# Patient Record
Sex: Female | Born: 1950
Health system: Southern US, Community
[De-identification: ages and names within clinical notes are randomized; demographics above are authoritative.]

## PROBLEM LIST (undated history)

## (undated) DIAGNOSIS — R0683 Snoring: Secondary | ICD-10-CM

## (undated) DIAGNOSIS — G4700 Insomnia, unspecified: Secondary | ICD-10-CM

## (undated) DIAGNOSIS — Z91199 Patient's noncompliance with other medical treatment and regimen due to unspecified reason: Secondary | ICD-10-CM

## (undated) DIAGNOSIS — Q231 Congenital insufficiency of aortic valve: Secondary | ICD-10-CM

## (undated) DIAGNOSIS — M545 Low back pain, unspecified: Secondary | ICD-10-CM

## (undated) DIAGNOSIS — Z9109 Other allergy status, other than to drugs and biological substances: Secondary | ICD-10-CM

## (undated) DIAGNOSIS — M797 Fibromyalgia: Secondary | ICD-10-CM

## (undated) DIAGNOSIS — R5383 Other fatigue: Secondary | ICD-10-CM

## (undated) DIAGNOSIS — F329 Major depressive disorder, single episode, unspecified: Secondary | ICD-10-CM

## (undated) DIAGNOSIS — M2550 Pain in unspecified joint: Secondary | ICD-10-CM

## (undated) DIAGNOSIS — Z780 Asymptomatic menopausal state: Secondary | ICD-10-CM

## (undated) DIAGNOSIS — Z960 Presence of urogenital implants: Secondary | ICD-10-CM

## (undated) DIAGNOSIS — Q2381 Bicuspid aortic valve: Secondary | ICD-10-CM

## (undated) DIAGNOSIS — M5030 Other cervical disc degeneration, unspecified cervical region: Secondary | ICD-10-CM

## (undated) DIAGNOSIS — J3081 Allergic rhinitis due to animal (cat) (dog) hair and dander: Secondary | ICD-10-CM

## (undated) DIAGNOSIS — M51369 Other intervertebral disc degeneration, lumbar region without mention of lumbar back pain or lower extremity pain: Secondary | ICD-10-CM

## (undated) DIAGNOSIS — F1190 Opioid use, unspecified, uncomplicated: Secondary | ICD-10-CM

## (undated) DIAGNOSIS — M2560 Stiffness of unspecified joint, not elsewhere classified: Secondary | ICD-10-CM

## (undated) DIAGNOSIS — R202 Paresthesia of skin: Secondary | ICD-10-CM

## (undated) DIAGNOSIS — N926 Irregular menstruation, unspecified: Secondary | ICD-10-CM

## (undated) DIAGNOSIS — Z8489 Family history of other specified conditions: Secondary | ICD-10-CM

## (undated) DIAGNOSIS — R011 Cardiac murmur, unspecified: Secondary | ICD-10-CM

## (undated) DIAGNOSIS — G4730 Sleep apnea, unspecified: Secondary | ICD-10-CM

## (undated) DIAGNOSIS — G5711 Meralgia paresthetica, right lower limb: Secondary | ICD-10-CM

## (undated) DIAGNOSIS — G43909 Migraine, unspecified, not intractable, without status migrainosus: Secondary | ICD-10-CM

## (undated) DIAGNOSIS — K219 Gastro-esophageal reflux disease without esophagitis: Secondary | ICD-10-CM

## (undated) DIAGNOSIS — E039 Hypothyroidism, unspecified: Secondary | ICD-10-CM

## (undated) DIAGNOSIS — J45909 Unspecified asthma, uncomplicated: Secondary | ICD-10-CM

## (undated) DIAGNOSIS — T7840XA Allergy, unspecified, initial encounter: Secondary | ICD-10-CM

## (undated) DIAGNOSIS — F909 Attention-deficit hyperactivity disorder, unspecified type: Secondary | ICD-10-CM

## (undated) DIAGNOSIS — H9319 Tinnitus, unspecified ear: Secondary | ICD-10-CM

## (undated) DIAGNOSIS — E785 Hyperlipidemia, unspecified: Secondary | ICD-10-CM

## (undated) DIAGNOSIS — R200 Anesthesia of skin: Secondary | ICD-10-CM

## (undated) DIAGNOSIS — D649 Anemia, unspecified: Secondary | ICD-10-CM

## (undated) DIAGNOSIS — M1990 Unspecified osteoarthritis, unspecified site: Secondary | ICD-10-CM

## (undated) DIAGNOSIS — S060XAA Concussion with loss of consciousness status unknown, initial encounter: Secondary | ICD-10-CM

## (undated) DIAGNOSIS — F32A Depression, unspecified: Secondary | ICD-10-CM

## (undated) DIAGNOSIS — F419 Anxiety disorder, unspecified: Secondary | ICD-10-CM

## (undated) DIAGNOSIS — I340 Nonrheumatic mitral (valve) insufficiency: Secondary | ICD-10-CM

## (undated) DIAGNOSIS — T8859XA Other complications of anesthesia, initial encounter: Secondary | ICD-10-CM

## (undated) DIAGNOSIS — S060X9A Concussion with loss of consciousness of unspecified duration, initial encounter: Secondary | ICD-10-CM

## (undated) DIAGNOSIS — T4145XA Adverse effect of unspecified anesthetic, initial encounter: Secondary | ICD-10-CM

## (undated) HISTORY — PX: CATARACT EXTRACTION: SUR2

## (undated) HISTORY — DX: Anesthesia of skin: R20.0

## (undated) HISTORY — PX: HERNIA REPAIR: SHX51

## (undated) HISTORY — DX: Stiffness of unspecified joint, not elsewhere classified: M25.60

## (undated) HISTORY — DX: Other allergy status, other than to drugs and biological substances: Z91.09

## (undated) HISTORY — DX: Other fatigue: R53.83

## (undated) HISTORY — DX: Migraine, unspecified, not intractable, without status migrainosus: G43.909

## (undated) HISTORY — PX: DENTAL SURGERY: SHX609

## (undated) HISTORY — DX: Paresthesia of skin: R20.2

## (undated) HISTORY — DX: Snoring: R06.83

## (undated) HISTORY — DX: Allergic rhinitis due to animal (cat) (dog) hair and dander: J30.81

## (undated) HISTORY — DX: Asymptomatic menopausal state: Z78.0

## (undated) HISTORY — DX: Hyperlipidemia, unspecified: E78.5

## (undated) HISTORY — DX: Unspecified asthma, uncomplicated: J45.909

## (undated) HISTORY — DX: Bicuspid aortic valve: Q23.81

## (undated) HISTORY — DX: Concussion with loss of consciousness status unknown, initial encounter: S06.0XAA

## (undated) HISTORY — PX: TONSILLECTOMY: SUR1361

## (undated) HISTORY — DX: Anemia, unspecified: D64.9

## (undated) HISTORY — DX: Attention-deficit hyperactivity disorder, unspecified type: F90.9

## (undated) HISTORY — DX: Pain in unspecified joint: M25.50

## (undated) HISTORY — PX: ABDOMINOPLASTY: SHX5355

## (undated) HISTORY — DX: Unspecified osteoarthritis, unspecified site: M19.90

## (undated) HISTORY — DX: Depression, unspecified: F32.A

## (undated) HISTORY — DX: Allergy, unspecified, initial encounter: T78.40XA

## (undated) HISTORY — DX: Tinnitus, unspecified ear: H93.19

## (undated) HISTORY — DX: Concussion with loss of consciousness of unspecified duration, initial encounter: S06.0X9A

## (undated) HISTORY — PX: SHOULDER ARTHROSCOPY W/ ACROMIAL REPAIR: SUR94

## (undated) HISTORY — DX: Low back pain, unspecified: M54.50

## (undated) HISTORY — DX: Major depressive disorder, single episode, unspecified: F32.9

## (undated) HISTORY — DX: Nonrheumatic mitral (valve) insufficiency: I34.0

## (undated) HISTORY — PX: COLONOSCOPY: SHX174

## (undated) HISTORY — DX: Irregular menstruation, unspecified: N92.6

---

## 1994-06-12 HISTORY — PX: CARPAL TUNNEL RELEASE: SHX101

## 2010-06-12 HISTORY — PX: PARATHYROIDECTOMY: SHX19

## 2011-07-11 DIAGNOSIS — F329 Major depressive disorder, single episode, unspecified: Secondary | ICD-10-CM | POA: Insufficient documentation

## 2011-07-11 DIAGNOSIS — R5383 Other fatigue: Secondary | ICD-10-CM | POA: Insufficient documentation

## 2011-12-24 DIAGNOSIS — F909 Attention-deficit hyperactivity disorder, unspecified type: Secondary | ICD-10-CM | POA: Insufficient documentation

## 2012-06-08 DIAGNOSIS — Z8639 Personal history of other endocrine, nutritional and metabolic disease: Secondary | ICD-10-CM | POA: Insufficient documentation

## 2012-08-07 DIAGNOSIS — M25512 Pain in left shoulder: Secondary | ICD-10-CM | POA: Insufficient documentation

## 2012-08-07 DIAGNOSIS — M545 Low back pain: Secondary | ICD-10-CM | POA: Insufficient documentation

## 2012-12-12 DIAGNOSIS — M5416 Radiculopathy, lumbar region: Secondary | ICD-10-CM | POA: Insufficient documentation

## 2012-12-31 DIAGNOSIS — M25552 Pain in left hip: Secondary | ICD-10-CM | POA: Insufficient documentation

## 2012-12-31 DIAGNOSIS — M51379 Other intervertebral disc degeneration, lumbosacral region without mention of lumbar back pain or lower extremity pain: Secondary | ICD-10-CM | POA: Insufficient documentation

## 2012-12-31 DIAGNOSIS — M5137 Other intervertebral disc degeneration, lumbosacral region: Secondary | ICD-10-CM | POA: Insufficient documentation

## 2013-04-09 DIAGNOSIS — D1800 Hemangioma unspecified site: Secondary | ICD-10-CM | POA: Insufficient documentation

## 2013-04-26 DIAGNOSIS — K760 Fatty (change of) liver, not elsewhere classified: Secondary | ICD-10-CM | POA: Insufficient documentation

## 2013-08-28 DIAGNOSIS — M47812 Spondylosis without myelopathy or radiculopathy, cervical region: Secondary | ICD-10-CM | POA: Insufficient documentation

## 2013-10-15 DIAGNOSIS — Z79899 Other long term (current) drug therapy: Secondary | ICD-10-CM | POA: Insufficient documentation

## 2014-05-29 ENCOUNTER — Encounter: Payer: Self-pay | Admitting: Physical Medicine & Rehabilitation

## 2014-06-23 ENCOUNTER — Encounter: Payer: BLUE CROSS/BLUE SHIELD | Attending: Physical Medicine & Rehabilitation

## 2014-06-23 ENCOUNTER — Other Ambulatory Visit: Payer: Self-pay | Admitting: Physical Medicine & Rehabilitation

## 2014-06-23 ENCOUNTER — Ambulatory Visit (HOSPITAL_BASED_OUTPATIENT_CLINIC_OR_DEPARTMENT_OTHER): Payer: BLUE CROSS/BLUE SHIELD | Admitting: Physical Medicine & Rehabilitation

## 2014-06-23 ENCOUNTER — Encounter: Payer: Self-pay | Admitting: Physical Medicine & Rehabilitation

## 2014-06-23 VITALS — BP 120/64 | HR 70 | Resp 14

## 2014-06-23 DIAGNOSIS — M4806 Spinal stenosis, lumbar region: Secondary | ICD-10-CM | POA: Insufficient documentation

## 2014-06-23 DIAGNOSIS — Z5181 Encounter for therapeutic drug level monitoring: Secondary | ICD-10-CM

## 2014-06-23 DIAGNOSIS — M4716 Other spondylosis with myelopathy, lumbar region: Secondary | ICD-10-CM | POA: Diagnosis not present

## 2014-06-23 DIAGNOSIS — M47812 Spondylosis without myelopathy or radiculopathy, cervical region: Secondary | ICD-10-CM | POA: Diagnosis not present

## 2014-06-23 DIAGNOSIS — Z79899 Other long term (current) drug therapy: Secondary | ICD-10-CM

## 2014-06-23 DIAGNOSIS — M5030 Other cervical disc degeneration, unspecified cervical region: Secondary | ICD-10-CM | POA: Insufficient documentation

## 2014-06-23 DIAGNOSIS — M48062 Spinal stenosis, lumbar region with neurogenic claudication: Secondary | ICD-10-CM

## 2014-06-23 NOTE — Addendum Note (Signed)
Addended by: Geryl Rankins D on: 06/23/2014 01:50 PM   Modules accepted: Orders

## 2014-06-23 NOTE — Patient Instructions (Signed)
L2-L3 paramedian epidural steroid injection for left thigh burning pain If this is not helpful in relieving the burning pain in your thigh, we would increase the Lyrica  This will be followed by Bilateral medial branch block L2-L3 L4-L5 to block pain from the L3-4 L4-5 and L5-S1 facet joints

## 2014-06-23 NOTE — Progress Notes (Signed)
Subjective:    Patient ID: Courtney [name], female    DOB: July 09, 1950, 64 y.o.   MRN: [id number] Reviewed extensive records from [facility] pain clinic HPI CC:  Low back pain and Left thigh numbness Several year hx of neck and back pain.  Good results with cervical RFA in May 2015 Has had L2-3 LESI with improvement of back and thigh pain ~1 yr ago Lumbar facet injections Bilateral L3,4,5  Intra-articular Very helpful for a couple weeks performed in August   Using Lyrica and Duloxetine with fair relief Tried tramadol in the past but unable to tolerate. Wants to avoid narcotic analgesics, still working  Pain Inventory Average Pain 9 Pain Right Now 9 My pain is constant, sharp, stabbing, tingling and aching  In the last 24 hours, has pain interfered with the following? General activity 8 Relation with others 5 Enjoyment of life 8 What TIME of day is your pain at its worst? VARIES Sleep (in general) Good  Pain is worse with: walking, bending, standing and some activites Pain improves with: rest, heat/ice, medication and injections Relief from Meds: 5  Mobility walk without assistance how many minutes can you walk? 15-30 ability to climb steps?  yes do you drive?  yes  Function employed # of hrs/week 37.5  [employer]  Neuro/Psych weakness numbness tingling trouble walking spasms depression anxiety  Prior Studies Any changes since last visit?  no  Physicians involved in your care Any changes since last visit?  no   Family History  Problem Relation Age of Onset  . COPD Mother   . Cancer Mother   . Hypertension Mother   . Hyperlipidemia Mother   . Heart disease Mother    History   Social History  . Marital Status: Married    Spouse Name: N/A    Number of Children: N/A  . Years of Education: N/A   Social History Main Topics  . Smoking status: Never Smoker   . Smokeless tobacco: Never Used  . Alcohol Use: 1.2 oz/week      2 Glasses of wine per week  . Drug Use: No  . Sexual Activity: No   Other Topics Concern  . None   Social History Narrative  . None   Past Surgical History  Procedure Laterality Date  . Hernia repair     Past Medical History  Diagnosis Date  . Arthritis   . Depression   . Hyperlipidemia    BP 120/64 mmHg  Pulse 70  Resp 14  SpO2 98%  LMP  (LMP Unknown)  Opioid Risk Score:   Fall Risk Score:    Review of Systems  HENT: Negative.   Eyes: Negative.   Respiratory: Negative.   Cardiovascular: Negative.   Gastrointestinal: Negative.   Endocrine: Negative.   Genitourinary: Negative.   Musculoskeletal: Positive for myalgias, back pain, arthralgias and neck pain.  Skin: Negative.   Allergic/Immunologic: Negative.   Neurological: Positive for weakness and numbness.       Tingling, trouble walking, spasms  Hematological: Negative.   Psychiatric/Behavioral: Positive for dysphoric mood. The patient is nervous/anxious.        Objective:   Physical Exam  Constitutional: She is oriented to person, place, and time. She appears well-developed and well-nourished.  HENT:  Head: Normocephalic and atraumatic.  Eyes: Conjunctivae and EOM are normal. Pupils are equal, round, and reactive to light.  Neck: Normal range of motion.  Musculoskeletal:       Right  hip: Normal.       Left hip: Normal.  Neurological: She is alert and oriented to person, place, and time. She displays no atrophy. A sensory deficit is present. She exhibits normal muscle tone. Gait normal.  Reflex Scores:      Tricep reflexes are 2+ on the right side and 2+ on the left side.      Bicep reflexes are 2+ on the right side and 2+ on the left side.      Brachioradialis reflexes are 2+ on the right side and 2+ on the left side.      Patellar reflexes are 2+ on the right side and 2+ on the left side.      Achilles reflexes are 2+ on the right side and 2+ on the left side. Psychiatric: She has a normal mood  and affect.  Nursing note and vitals reviewed.   Sensation to pinprick Reduced bilateral C6-C7, intact C8 Reduced left L3 and left L4  Negative straight leg raise test Positive left femoral stretch test  Motor strength is 5/5 bilateral deltoid, bicep, tricep 4+ at the grip limited by arthritic pains in the hands 5/5 bilateral hip flexor and extensor ankle dorsal flexion plantar flexor  Gait is without toe drag or knee instability  Lumbar range of motion full flexion, extension limited to 50%, lateral bending to 50%, twisting to 25%     Assessment & Plan:  1. Lumbar spinal stenosis with intermittent radicular pain left L3 distribution. Had good relief with prior L-2-L3 epidural injection performed close to 1 year ago. Now has recurrence of symptoms. We'll schedule for repeat injection. If not helpful we will have to increase Lyrica to 300 mg twice a day or 200 mg 3 times a day 2. Lumbar spondylosis with radiographic evidence of L3-L4, L4-L5, and L5-S1 levels bilaterally. Will schedule for lumbar medial branch blocks to be performed 2-3 weeks after lumbar epidural  3. Chronic lumbar pain has had fair improvements on duloxetine. Unable to take tramadol. Once to avoid narcotic analgesics.

## 2014-06-24 LAB — PRESCRIPTION MONITORING PROFILE (SOLSTAS)
Amphetamine/Meth: NEGATIVE ng/mL
BARBITURATE SCREEN, URINE: NEGATIVE ng/mL
Benzodiazepine Screen, Urine: NEGATIVE ng/mL
Buprenorphine, Urine: NEGATIVE ng/mL
Cannabinoid Scrn, Ur: NEGATIVE ng/mL
Carisoprodol, Urine: NEGATIVE ng/mL
Cocaine Metabolites: NEGATIVE ng/mL
Creatinine, Urine: 116.26 mg/dL (ref >20.0)
Fentanyl, Ur: NEGATIVE ng/mL
MDMA URINE: NEGATIVE ng/mL
Meperidine, Ur: NEGATIVE ng/mL
Methadone Screen, Urine: NEGATIVE ng/mL
NITRITES URINE, INITIAL: NEGATIVE ug/mL
Opiate Screen, Urine: NEGATIVE ng/mL
Oxycodone Screen, Ur: NEGATIVE ng/mL
PROPOXYPHENE: NEGATIVE ng/mL
TAPENTADOLUR: NEGATIVE ng/mL
TRAMADOL UR: NEGATIVE ng/mL
ZOLPIDEM, URINE: NEGATIVE ng/mL
pH, Initial: 5.1 pH (ref 4.5–8.9)

## 2014-06-24 LAB — PMP ALCOHOL METABOLITE (ETG): Ethyl Glucuronide (EtG): NEGATIVE ng/mL

## 2014-06-29 NOTE — Progress Notes (Signed)
Urine drug screen for this encounter is consistent for reported no medication

## 2014-07-07 ENCOUNTER — Encounter: Payer: Self-pay | Admitting: Physical Medicine & Rehabilitation

## 2014-07-07 ENCOUNTER — Ambulatory Visit (HOSPITAL_BASED_OUTPATIENT_CLINIC_OR_DEPARTMENT_OTHER): Payer: BLUE CROSS/BLUE SHIELD | Admitting: Physical Medicine & Rehabilitation

## 2014-07-07 VITALS — BP 99/58 | HR 79 | Resp 14

## 2014-07-07 DIAGNOSIS — M5416 Radiculopathy, lumbar region: Secondary | ICD-10-CM

## 2014-07-07 DIAGNOSIS — Z5181 Encounter for therapeutic drug level monitoring: Secondary | ICD-10-CM | POA: Diagnosis not present

## 2014-07-07 NOTE — Progress Notes (Signed)
Lumbar epidural steroid injection under fluoroscopic guidance Left Paramedian L2-3 Translaminar  Indication: Lumbosacral radiculitis is not relieved by medication management or other conservative care and interfering with self-care and mobility.  No  anticoagulant use.  Informed consent was obtained after describing risk and benefits of the procedure with the patient, this includes bleeding, bruising, infection, paralysis and medication side effects.  The patient wishes to proceed and has given written consent.  Patient was placed in a prone position.  The lumbar area was marked and prepped with Betadine.  It was entered with a 25-gauge 1-1/2 inch needle and one mL of 1% lidocaine was injected into the skin and subcutaneous tissue.  Then a 17-gauge spinal needle was inserted under fluoroscopic guidance into the Left L2-3 interlaminar space under AP and Lateral imaging.  Once needle tip of approximated the posterior elements, a loss of resistance technique was utilized with lateral imaging.  A positive loss of resistance was obtained and then confirmed by injecting 2 mL's of Omnipaque 180.  Then a solution containing 1.5 mL's of 6mg /ml Celestone and 1.5 mL's of 1% lidocaine was injected.  The patient tolerated procedure well.  Post procedure instructions were given.  Please see post procedure form.

## 2014-07-07 NOTE — Patient Instructions (Signed)

## 2014-07-07 NOTE — Progress Notes (Signed)
  PROCEDURE RECORD [facility]   Name: Courtney [name] DOB:03/09/1951 MRN: [id number]  Date:07/07/2014  Physician: Alysia Penna, MD    Nurse/CMA: Sydnei Ohaver RN  Allergies:  Allergies  Allergen Reactions  . Fish Allergy   . Peanuts [Peanut Oil]     ALL NUTS and their derivatives    Consent Signed: Yes.    Is patient diabetic? No.  CBG today?  Pregnant: No. LMP: No LMP recorded (lmp unknown). Patient is postmenopausal. (age 81-55)  Anticoagulants: no Anti-inflammatory: no Antibiotics: no  Procedure: L2-3 Translaminar Epidural Steroid Injection Position: Prone Start Time: 2:38 End Time: 2:43 Fluoro Time: 12 seconds  RN/CMA Biomedical engineer    Time 2:30 2:50    BP 99/58 104/59    Pulse 79 75    Respirations 14 14    O2 Sat 95 97    S/S 6 6    Pain Level 6/10 6/10     D/C home with son, patient A & O X 3, D/C instructions reviewed, and sits independently.

## 2014-07-28 ENCOUNTER — Encounter: Payer: Self-pay | Admitting: Physical Medicine & Rehabilitation

## 2014-07-28 ENCOUNTER — Ambulatory Visit (HOSPITAL_BASED_OUTPATIENT_CLINIC_OR_DEPARTMENT_OTHER): Payer: BLUE CROSS/BLUE SHIELD | Admitting: Physical Medicine & Rehabilitation

## 2014-07-28 ENCOUNTER — Telehealth: Payer: Self-pay | Admitting: *Deleted

## 2014-07-28 ENCOUNTER — Encounter: Payer: BLUE CROSS/BLUE SHIELD | Attending: Physical Medicine & Rehabilitation

## 2014-07-28 VITALS — BP 116/61 | HR 79 | Resp 14

## 2014-07-28 DIAGNOSIS — M5030 Other cervical disc degeneration, unspecified cervical region: Secondary | ICD-10-CM | POA: Insufficient documentation

## 2014-07-28 DIAGNOSIS — M47812 Spondylosis without myelopathy or radiculopathy, cervical region: Secondary | ICD-10-CM | POA: Insufficient documentation

## 2014-07-28 DIAGNOSIS — M4806 Spinal stenosis, lumbar region: Secondary | ICD-10-CM | POA: Insufficient documentation

## 2014-07-28 DIAGNOSIS — Z5181 Encounter for therapeutic drug level monitoring: Secondary | ICD-10-CM | POA: Diagnosis not present

## 2014-07-28 DIAGNOSIS — M47816 Spondylosis without myelopathy or radiculopathy, lumbar region: Secondary | ICD-10-CM

## 2014-07-28 DIAGNOSIS — M4716 Other spondylosis with myelopathy, lumbar region: Secondary | ICD-10-CM | POA: Insufficient documentation

## 2014-07-28 MED ORDER — PREGABALIN 100 MG PO CAPS: 100.0000 mg | ORAL_CAPSULE | Freq: Every day | ORAL | Status: DC

## 2014-07-28 NOTE — Progress Notes (Signed)
Bilateral Lumbar L3, L4  medial branch blocks and L 5 dorsal ramus injection under fluoroscopic guidance  Indication: Lumbar pain which is not relieved by medication management or other conservative care and interfering with self-care and mobility.  Informed consent was obtained after describing risks and benefits of the procedure with the patient, this includes bleeding, infection, paralysis and medication side effects.  The patient wishes to proceed and has given written consent.  The patient was placed in prone position.  The lumbar area was marked and prepped with Betadine.  One mL of 1% lidocaine was injected into each of 6 areas into the skin and subcutaneous tissue.  Then a 22-gauge 3.5inch spinal needle was inserted targeting the junction of the left S1 superior articular process and sacral ala junction. Needle was advanced under fluoroscopic guidance.  Bone contact was made.  Omnipaque 180 was injected x 0.5 mL demonstrating no intravascular uptake.  Then a solution containing one mL of 4 mg per mL dexamethasone and 3 mL of 2% MPF lidocaine was injected x 0.5 mL.  Then the left L5 superior articular process in transverse process junction was targeted.  Bone contact was made.  Omnipaque 180 was injected x 0.5 mL demonstrating no intravascular uptake. Then a solution containing one mL of 4 mg per mL dexamethasone and 3 mL of 2% MPF lidocaine was injected x 0.5 mL.  Then the left L4 superior articular process in transverse process junction was targeted.  Bone contact was made.  Omnipaque 180 was injected x 0.5 mL demonstrating no intravascular uptake.  Then a solution containing one mL of 4 mg per mL dexamethasone and 3 mL if 2% MPF lidocaine was injected x 0.5 mL. Then the left L3 superior articular process in transverse process junction was targeted.  Bone contact was made.  Omnipaque 180 was injected x 0.5 mL demonstrating no intravascular uptake.  Then a solution containing one mL of 4 mg per mL  dexamethasone and 3 mL if 2% MPF lidocaine was injected x 0.5 mL This same procedure was performed on the right side using the same needle, technique and injectate.  Patient tolerated procedure well.  Post procedure instructions were given.

## 2014-07-28 NOTE — Telephone Encounter (Signed)
lyrica rx given by MD was called [facility] requested by Ms Kiplingler  ([facility] f[facility] [phone])

## 2014-07-28 NOTE — Progress Notes (Signed)
  PROCEDURE RECORD [name]   Name: Courtney [name] DOB:1951-05-06 MRN: [id number]  Date:07/28/2014  Physician: Alysia Penna, MD    Nurse/CMA:Shumaker RN  Allergies:  Allergies  Allergen Reactions  . Fish Allergy   . Peanuts [Peanut Oil]     ALL NUTS and their derivatives    Consent Signed: Yes.    Is patient diabetic? No.  CBG today?   Pregnant: No. LMP: No LMP recorded (lmp unknown). Patient is postmenopausal. (age 64-55)  Anticoagulants: no Anti-inflammatory: no Antibiotics: no  Procedure: Bilateral L2-3 L4-5 Medical Branch Blocks Position: Prone Start Time: 3:10  End Time: 3:26  Fluoro Time: 58 seconds  RN/CMA Biomedical engineer    Time 2:57 3:35    BP 116/61 120/52    Pulse 79 82    Respirations 14 14    O2 Sat 95 97    S/S 6 6    Pain Level 9/10 4/10     D/C home with son , patient A & O X 3, D/C instructions reviewed, and sits independently.

## 2014-07-28 NOTE — Patient Instructions (Signed)

## 2014-08-03 ENCOUNTER — Telehealth: Payer: Self-pay | Admitting: *Deleted

## 2014-08-03 NOTE — Telephone Encounter (Signed)
Pt says she had a MBB last Tuesday and reports the effect wore off after 4 days. She is having no relief. She thinks we need to develop a new strategy to go after the SI joint with an Epidural or Steroid injections rather than wait 3 weeks for the RF tx. She is hoping to come in sooner and try something with the SI joint

## 2014-08-03 NOTE — Telephone Encounter (Signed)
Will need to wait 3 weeks for another injection regardless. We will review whether the patient had 50% relief at least short-term with both medial branch blocks and if so we'll proceed onto radiofrequency

## 2014-08-04 NOTE — Telephone Encounter (Signed)
I called the patient back and relayed  Dr. Letta Pate message. Pt was disappointed as she is in a lot of pain. Said she will see [place] in 3 weeks

## 2014-08-27 ENCOUNTER — Encounter: Payer: Self-pay | Admitting: Physical Medicine & Rehabilitation

## 2014-08-27 ENCOUNTER — Encounter: Payer: BLUE CROSS/BLUE SHIELD | Attending: Physical Medicine & Rehabilitation

## 2014-08-27 ENCOUNTER — Ambulatory Visit: Payer: BLUE CROSS/BLUE SHIELD | Admitting: Physical Medicine & Rehabilitation

## 2014-08-27 VITALS — BP 117/58 | HR 77 | Resp 14

## 2014-08-27 DIAGNOSIS — M5030 Other cervical disc degeneration, unspecified cervical region: Secondary | ICD-10-CM | POA: Insufficient documentation

## 2014-08-27 DIAGNOSIS — M4806 Spinal stenosis, lumbar region: Secondary | ICD-10-CM | POA: Insufficient documentation

## 2014-08-27 DIAGNOSIS — M47812 Spondylosis without myelopathy or radiculopathy, cervical region: Secondary | ICD-10-CM | POA: Diagnosis not present

## 2014-08-27 DIAGNOSIS — M4716 Other spondylosis with myelopathy, lumbar region: Secondary | ICD-10-CM | POA: Diagnosis not present

## 2014-08-27 DIAGNOSIS — M48062 Spinal stenosis, lumbar region with neurogenic claudication: Secondary | ICD-10-CM

## 2014-08-27 DIAGNOSIS — Z5181 Encounter for therapeutic drug level monitoring: Secondary | ICD-10-CM | POA: Insufficient documentation

## 2014-08-27 NOTE — Progress Notes (Signed)
RightL5 dorsal ramus., Right L4 and Right L3 medial branch radio frequency neuropathy under fluoroscopic guidance   Indication: Low back pain due to lumbar spondylosis which has been relieved on 2 occasions by greater than 50% by lumbar medial branch blocks at corresponding levels.  Informed consent was obtained after describing risks and benefits of the procedure with the patient, this includes bleeding, bruising, infection, paralysis and medication side effects. The patient wishes to proceed and has given written consent. The patient was placed in a prone position. The lumbar and sacral area was marked and prepped with Betadine. A 25-gauge 1-1/2 inch needle was inserted into the skin and subcutaneous tissue at 3 sites in one ML of 1% lidocaine was injected into each site. Then a 20-gauge 10cm cm radio frequency needle with a 1 cm curved active tip was inserted targeting the Right S1 SAP/sacral ala junction. Bone contact was made and confirmed with lateral imaging. Sensory stimulation at 50 Hz followed by motor stimulation at 2 Hz confirm proper needle location followed by injection of one ML of the solution containing one ML of 4 mg per mL dexamethasone and 3 mL of 1% MPF lidocaine. Then the Right L5 SAP/transverse process junction was targeted. Bone contact was made and confirmed with lateral imaging. Sensory stimulation at 50 Hz followed by motor stimulation at 2 Hz confirm proper needle location followed by injection of one ML of the solution containing one ML of 4 mg per mL dexamethasone and 3 mL of 1% MPF lidocaine. Then the Right L4 SAP/transverse process junction was targeted. Bone contact was made and confirmed with lateral imaging. Sensory stimulation at 50 Hz followed by motor stimulation at 2 Hz confirm proper needle location followed by injection of one ML of the solution containing one ML of 4 mg per mL dexamethasone and 3 mL of 1% MPF lidocaine. Radio frequency lesion being at [facility] for 90 seconds  was performed. Needles were removed. Post procedure instructions and vital signs were performed. Patient tolerated procedure well. Followup appointment was given.

## 2014-08-27 NOTE — Progress Notes (Signed)
  PROCEDURE RECORD  [facility]   Name: Courtney [name] DOB:September 21, 1950 MRN: [id number]  Date:08/27/2014  Physician: Alysia Penna, MD    Nurse/CMA:MaryBeth Ginkel  Allergies:  Allergies  Allergen Reactions  . Fish Allergy   . Peanuts [Peanut Oil]     ALL NUTS and their derivatives    Consent Signed: Yes.    Is patient diabetic? No.  CBG today? NA  Pregnant: No. LMP: No LMP recorded (lmp unknown). Patient is postmenopausal. (age 64-55)  Anticoagulants: no Anti-inflammatory: yes (naproxen) Antibiotics: no  Procedure: RFA Right  Position: Prone Start Time:  1:38pm  End Time: 1:50  Fluoro Time: 33  RN/CMA Ken Hashim Eichhorst MaryBeth Ginkel    Time 1:20 PM 2:09pm    BP 117/58 107/54    Pulse 77 82    Respirations 14 14    O2 Sat 96 98    S/S 6 6    Pain Level 5/10 5/10     D/C home with Courtney [name] driver, patient A & O X 3, D/C instructions reviewed, and sits independently.

## 2014-08-27 NOTE — Patient Instructions (Signed)
You had a radio frequency procedure today This was done to alleviate joint pain in your lumbar area We injected a combination of dexamethasone which is a steroid as well as lidocaine which is a local anesthetic. Dexamethasone made increased blood sugars you are diabetic You may experience soreness at the injection sites. You may also experienced some irritation of the nerves that were heated I'm recommending ice for 30 minutes every 2 hours as needed for the next 24-48 hours In addition you will be taking gabapentin and Lyrica

## 2014-09-01 ENCOUNTER — Telehealth: Payer: Self-pay | Admitting: *Deleted

## 2014-09-01 NOTE — Telephone Encounter (Signed)
At this point it really doesn't matter no recent to take it it's only for the first day to reduce nerve irritation

## 2014-09-01 NOTE — Telephone Encounter (Signed)
Courtney [name] called saying Dr Letta Pate was supposed to order some gabapentin for her after her RF procedure last week and she went to the pharmacy and they still do not have the order.  Can you advise?

## 2014-09-01 NOTE — Telephone Encounter (Signed)
I left Courtney [name] the message about gabapentin was for the post procedure RF but she called back. It is Courtney [name]'s understanding that after talking with you before her procedure that you were going to let her try it on a nightly basis.  She takes lyrica but you said sometimes you prescribe gabapentin as well.

## 2014-09-01 NOTE — Telephone Encounter (Signed)
Notified by name identified vm.

## 2014-09-02 MED ORDER — GABAPENTIN 100 MG PO CAPS: 100.0000 mg | ORAL_CAPSULE | Freq: Every day | ORAL | Status: DC

## 2014-09-02 NOTE — Telephone Encounter (Signed)
I thought a put in an order for gabapentin 100 mg daily at bedtime if not please place 100 mg daily at bedtime gabapentin No. 30 one refill

## 2014-09-02 NOTE — Telephone Encounter (Signed)
Notified Prisila.

## 2014-09-17 ENCOUNTER — Ambulatory Visit: Payer: BLUE CROSS/BLUE SHIELD | Admitting: Physical Medicine & Rehabilitation

## 2014-09-18 ENCOUNTER — Telehealth: Payer: Self-pay | Admitting: *Deleted

## 2014-09-18 NOTE — Telephone Encounter (Signed)
Courtney [name] called because [facility] is not wanting to ship lyrica because she is on gabapentin.  She is asking that we call [facility] and straighten this out.  We received a fax from [facility] about this and  I have sent it verifying that we are prescribing both.

## 2014-09-22 ENCOUNTER — Encounter: Payer: Self-pay | Admitting: Physical Medicine & Rehabilitation

## 2014-09-22 ENCOUNTER — Encounter: Payer: BLUE CROSS/BLUE SHIELD | Attending: Physical Medicine & Rehabilitation

## 2014-09-22 ENCOUNTER — Ambulatory Visit (HOSPITAL_BASED_OUTPATIENT_CLINIC_OR_DEPARTMENT_OTHER): Payer: BLUE CROSS/BLUE SHIELD | Admitting: Physical Medicine & Rehabilitation

## 2014-09-22 VITALS — BP 117/57 | HR 76 | Resp 14

## 2014-09-22 DIAGNOSIS — M4716 Other spondylosis with myelopathy, lumbar region: Secondary | ICD-10-CM | POA: Insufficient documentation

## 2014-09-22 DIAGNOSIS — M4806 Spinal stenosis, lumbar region: Secondary | ICD-10-CM | POA: Insufficient documentation

## 2014-09-22 DIAGNOSIS — Z5181 Encounter for therapeutic drug level monitoring: Secondary | ICD-10-CM | POA: Insufficient documentation

## 2014-09-22 DIAGNOSIS — M5416 Radiculopathy, lumbar region: Secondary | ICD-10-CM | POA: Diagnosis not present

## 2014-09-22 DIAGNOSIS — M5030 Other cervical disc degeneration, unspecified cervical region: Secondary | ICD-10-CM | POA: Insufficient documentation

## 2014-09-22 DIAGNOSIS — M47812 Spondylosis without myelopathy or radiculopathy, cervical region: Secondary | ICD-10-CM | POA: Insufficient documentation

## 2014-09-22 MED ORDER — GABAPENTIN 300 MG PO CAPS: 300.0000 mg | ORAL_CAPSULE | Freq: Every day | ORAL | Status: DC

## 2014-09-22 NOTE — Progress Notes (Signed)
Right S1  transforaminal epidural steroid injection under fluoroscopic guidance  Indication: Lumbosacral radiculitis is not relieved by medication management or other conservative care and interfering with self-care and mobility. Pain down Right buttocks, post thigh, numbness in R 5th toe  Informed consent was obtained after describing risk and benefits of the procedure with the patient, this includes bleeding, bruising, infection, paralysis and medication side effects.  The patient wishes to proceed and has given written consent.  Patient was placed in prone position.  The lumbar area was marked and prepped with Betadine.  It was entered with a 25-gauge 1-1/2 inch needle and one mL of 1% lidocaine was injected into the skin and subcutaneous tissue.  Then a 22-gauge 3.5 inch spinal needle was inserted into the RIght S1 intervertebral foramen under AP, lateral, and oblique view.  Then a solution containing one mL of 10 mg per mL dexamethasone and 2 mL of 1% lidocaine was injected.  The patient tolerated procedure well.  Post procedure instructions were given.  Please see post procedure form.

## 2014-09-22 NOTE — Progress Notes (Signed)
  PROCEDURE RECORD [facility]   Name: DENETRA FORMOSO DOB:[birth year] MRN: [id number]  Date:09/22/2014  Physician: Alysia Penna, MD    Nurse/CMA: Mancel Parsons  Allergies:  Allergies  Allergen Reactions  . Fish Allergy   . Peanuts [Peanut Oil]     ALL NUTS and their derivatives    Consent Signed: Yes.    Is patient diabetic? No.  CBG today?   Pregnant: No. LMP: No LMP recorded (lmp unknown). Patient is postmenopausal. (age 9-55)  Anticoagulants: no Anti-inflammatory: no Antibiotics: no  Procedure: s1 transforaminal epidural steroid injection  Position: Prone Start Time:11:12 am  End Time: 11:15 AM  Fluoro Time: 10  RN/CMA Rolan Bucco Jaelynne Hockley    Time 10:50 am 11:18 am    BP 117/57 137/49    Pulse 76 70    Respirations 14 14    O2 Sat 97 97    S/S 6 6    Pain Level 4/10 3/10     D/C home with son, patient A & O X 3, D/C instructions reviewed, and sits independently.

## 2014-09-22 NOTE — Patient Instructions (Signed)

## 2014-09-29 ENCOUNTER — Other Ambulatory Visit: Payer: Self-pay | Admitting: *Deleted

## 2014-09-29 MED ORDER — GABAPENTIN 300 MG PO CAPS: 300.0000 mg | ORAL_CAPSULE | Freq: Every day | ORAL | Status: DC

## 2014-09-29 NOTE — Telephone Encounter (Signed)
Recd refill request from [facility] - Gabapentin 300 mg take 1 tablet at bedtime.  #90  #RF 1.  Sent in electronically

## 2014-10-01 ENCOUNTER — Ambulatory Visit: Payer: BLUE CROSS/BLUE SHIELD | Admitting: Physical Medicine & Rehabilitation

## 2014-10-08 ENCOUNTER — Other Ambulatory Visit: Payer: Self-pay | Admitting: *Deleted

## 2014-10-08 MED ORDER — GABAPENTIN 300 MG PO CAPS: 300.0000 mg | ORAL_CAPSULE | Freq: Every day | ORAL | Status: DC

## 2014-10-13 ENCOUNTER — Other Ambulatory Visit: Payer: Self-pay | Admitting: *Deleted

## 2014-10-13 MED ORDER — PREGABALIN 100 MG PO CAPS: 100.0000 mg | ORAL_CAPSULE | Freq: Every day | ORAL | Status: DC

## 2014-10-13 NOTE — Telephone Encounter (Signed)
Order from [facility] to fill 90 day supply. This was done by fax but ordered under print in EPIC. Signed fax by Klirsteins scanned under media tab.

## 2014-10-19 ENCOUNTER — Telehealth: Payer: Self-pay | Admitting: *Deleted

## 2014-10-19 NOTE — Telephone Encounter (Signed)
Courtney [name] is calling because [facility] does not have her lyrica and if we do not do something she is going to be out.  I checked and I called the 90 day supply in on 10/13/14 and am not sure what the problem is.  I called and spoke to [facility] [phone] and he sid they have the order but it is too soon to ship.  He said they just mailed order 09/10/14.  When he looked further it was a 30 day supply so it is NOT too early to ship so he is releasing the 90 day supply today and it should be received this week.  I notified [name] that the problem was on [facility] side and not our mistake. If she has further problems I asked her to call back but it should be cleared to ship now.

## 2014-10-27 ENCOUNTER — Encounter: Payer: BLUE CROSS/BLUE SHIELD | Attending: Physical Medicine & Rehabilitation

## 2014-10-27 ENCOUNTER — Encounter: Payer: Self-pay | Admitting: Physical Medicine & Rehabilitation

## 2014-10-27 ENCOUNTER — Ambulatory Visit (HOSPITAL_BASED_OUTPATIENT_CLINIC_OR_DEPARTMENT_OTHER): Payer: BLUE CROSS/BLUE SHIELD | Admitting: Physical Medicine & Rehabilitation

## 2014-10-27 VITALS — HR 76 | Resp 14

## 2014-10-27 DIAGNOSIS — M5030 Other cervical disc degeneration, unspecified cervical region: Secondary | ICD-10-CM | POA: Insufficient documentation

## 2014-10-27 DIAGNOSIS — Z5181 Encounter for therapeutic drug level monitoring: Secondary | ICD-10-CM | POA: Insufficient documentation

## 2014-10-27 DIAGNOSIS — M47816 Spondylosis without myelopathy or radiculopathy, lumbar region: Secondary | ICD-10-CM | POA: Diagnosis not present

## 2014-10-27 DIAGNOSIS — M4716 Other spondylosis with myelopathy, lumbar region: Secondary | ICD-10-CM | POA: Insufficient documentation

## 2014-10-27 DIAGNOSIS — M47812 Spondylosis without myelopathy or radiculopathy, cervical region: Secondary | ICD-10-CM | POA: Insufficient documentation

## 2014-10-27 DIAGNOSIS — M4806 Spinal stenosis, lumbar region: Secondary | ICD-10-CM | POA: Diagnosis not present

## 2014-10-27 NOTE — Progress Notes (Signed)
Left L5 dorsal ramus., left L4, left L3, Left L2 medial branch radio frequency neuropathy under fluoroscopic guidance  Indication: Low back pain due to lumbar spondylosis which has been relieved on 2 occasions by greater than 50% by lumbar medial branch blocks at corresponding levels.  Informed consent was obtained after describing risks and benefits of the procedure with the patient, this includes bleeding, bruising, infection, paralysis and medication side effects. The patient wishes to proceed and has given written consent. The patient was placed in a prone position. The lumbar and sacral area was marked and prepped with Betadine. A 25-gauge 1-1/2 inch needle was inserted into the skin and subcutaneous tissue at 3 sites in one ML of 1% lidocaine was injected into each site. Then a 20-gauge 10cm cm radio frequency needle with a 1 cm curved active tip was inserted targeting the left S1 SAP/sacral ala junction. Bone contact was made and confirmed with lateral imaging. Sensory stimulation at 50 Hz followed by motor stimulation at 2 Hz confirm proper needle location followed by injection of one ML of the solution containing one ML of 4 mg per mL dexamethasone and 3 mL of 1% MPF lidocaine. Then the left L5 SAP/transverse process junction was targeted. Bone contact was made and confirmed with lateral imaging. Sensory stimulation at 50 Hz followed by motor stimulation at 2 Hz confirm proper needle location followed by injection of one ML of the solution containing one ML of 4 mg per mL dexamethasone and 3 mL of 1% MPF lidocaine. Then the left L4 SAP/transverse process junction was targeted. Bone contact was made and confirmed with lateral imaging. Sensory stimulation at 50 Hz followed by motor stimulation at 2 Hz confirm proper needle location followed by injection of one ML of the solution containing one ML of 4 mg per mL dexamethasone and 3 mL of 1% MPF lidocaine.Left L3 SAP/transverse process junction was  targeted. Bone contact was made and confirmed with lateral imaging. Sensory stimulation at 50 Hz followed by motor stimulation at 2 Hz confirm proper needle location followed by injection of one ML of the solution containing one ML of 4 mg per mL dexamethasone and 3 mL of 1% MPF lidocaine Radio frequency lesion being at [facility] for 90 seconds was performed. Needles were removed. Post procedure instructions and vital signs were performed. Patient tolerated procedure well. Followup appointment was given.

## 2014-10-27 NOTE — Patient Instructions (Addendum)
Trochanteric Bursitis You have hip pain due to trochanteric bursitis. Bursitis means that the sack near the outside of the hip is filled with fluid and inflamed. This sack is made up of protective soft tissue. The pain from trochanteric bursitis can be severe and keep you from sleep. It can radiate to the buttocks or down the outside of the thigh to the knee. The pain is almost always worse when rising from the seated or lying position and with walking. Pain can improve after you take a few steps. It happens more often in people with hip joint and lumbar spine problems, such as arthritis or previous surgery. Very rarely the trochanteric bursa can become infected, and antibiotics and/or surgery may be needed. Treatment often includes an injection of local anesthetic mixed with cortisone medicine. This medicine is injected into the area where it is most tender over the hip. Repeat injections may be necessary if the response to treatment is slow. You can apply ice packs over the tender area for 30 minutes every 2 hours for the next few days. Anti-inflammatory and/or narcotic pain medicine may also be helpful. Limit your activity for the next few days if the pain continues. See your caregiver in 5-10 days if you are not greatly improved.  SEEK IMMEDIATE MEDICAL CARE IF:  You develop severe pain, fever, or increased redness.  You have pain that radiates below the knee. EXERCISES STRETCHING EXERCISES - Trochanteric Bursitis  These exercises may help you when beginning to rehabilitate your injury. Your symptoms may resolve with or without further involvement from your physician, physical therapist, or athletic trainer. While completing these exercises, remember:   Restoring tissue flexibility helps normal motion to return to the joints. This allows healthier, less painful movement and activity.  An effective stretch should be held for at least 30 seconds.  A stretch should never be painful. You should only  feel a gentle lengthening or release in the stretched tissue. STRETCH - Iliotibial Band  On the floor or bed, lie on your side so your injured leg is on top. Bend your knee and grab your ankle.  Slowly bring your knee back so that your thigh is in line with your trunk. Keep your heel at your buttocks and gently arch your back so your head, shoulders and hips line up.  Slowly lower your leg so that your knee approaches the floor/bed until you feel a gentle stretch on the outside of your thigh. If you do not feel a stretch and your knee will not fall farther, place the heel of your opposite foot on top of your knee and pull your thigh down farther.  Hold this stretch for __________ seconds.  Repeat __________ times. Complete this exercise __________ times per day. STRETCH - Hamstrings, Supine   Lie on your back. Loop a belt or towel over the ball of your foot as shown.  Straighten your knee and slowly pull on the belt to raise your injured leg. Do not allow the knee to bend. Keep your opposite leg flat on the floor.  Raise the leg until you feel a gentle stretch behind your knee or thigh. Hold this position for __________ seconds.  Repeat __________ times. Complete this stretch __________ times per day. STRETCH - Quadriceps, Prone   Lie on your stomach on a firm surface, such as a bed or padded floor.  Bend your knee and grasp your ankle. If you are unable to reach your ankle or pant leg, use a belt   around your foot to lengthen your reach.  Gently pull your heel toward your buttocks. Your knee should not slide out to the side. You should feel a stretch in the front of your thigh and/or knee.  Hold this position for __________ seconds.  Repeat __________ times. Complete this stretch __________ times per day. STRETCHING - Hip Flexors, Lunge Half kneel with your knee on the floor and your opposite knee bent and directly over your ankle.  Keep good posture with your head over your  shoulders. Tighten your buttocks to point your tailbone downward; this will prevent your back from arching too much.  You should feel a gentle stretch in the front of your thigh and/or hip. If you do not feel any resistance, slightly slide your opposite foot forward and then slowly lunge forward so your knee once again lines up over your ankle. Be sure your tailbone remains pointed downward.  Hold this stretch for __________ seconds.  Repeat __________ times. Complete this stretch __________ times per day. STRETCH - Adductors, Lunge  While standing, spread your legs.  Lean away from your injured leg by bending your opposite knee. You may rest your hands on your thigh for balance.  You should feel a stretch in your inner thigh. Hold for __________ seconds.  Repeat __________ times. Complete this exercise __________ times per day. Document Released: 07/06/2004 Document Revised: 10/13/2013 Document Reviewed: 09/10/2008 [facility] Patient Information 2015 [place], [place]. This information is not intended to replace advice given to you by your health care provider. Make sure you discuss any questions you have with your health care provider.    5 more gabapentin pills on 5/17

## 2014-10-27 NOTE — Progress Notes (Signed)
  PROCEDURE RECORD [facility]   Name: Courtney [name] DOB:[birth year] MRN: [id number]  Date:10/27/2014  Physician: Alysia Penna, MD    Nurse/CMA: Willis Kuipers  Allergies:  Allergies  Allergen Reactions  . Fish Allergy   . Peanuts [Peanut Oil]     ALL NUTS and their derivatives    Consent Signed: Yes.    Is patient diabetic? No.  CBG today? .  Pregnant: No. LMP: No LMP recorded (lmp unknown). Patient is postmenopausal. (age 26-55)  Anticoagulants: no Anti-inflammatory: no Antibiotics: no  Procedure: RFA Left L2,3,4 &5 Position: Prone Start Time: 2:16pm  End Time: 2:47  Fluoro Time: 57  RN/CMA Natallia Stellmach [place]    Time 1:42 2:56    BP 116/56 118/74    Pulse 78 74    Respirations 14 14    O2 Sat 99 97    S/S 6 6    Pain Level 8/10 3/10     D/C home with husband, patient A & O X 3, D/C instructions reviewed, and sits independently.

## 2014-12-01 ENCOUNTER — Ambulatory Visit (HOSPITAL_BASED_OUTPATIENT_CLINIC_OR_DEPARTMENT_OTHER): Payer: BLUE CROSS/BLUE SHIELD | Admitting: Physical Medicine & Rehabilitation

## 2014-12-01 ENCOUNTER — Encounter: Payer: Self-pay | Admitting: Physical Medicine & Rehabilitation

## 2014-12-01 ENCOUNTER — Encounter: Payer: BLUE CROSS/BLUE SHIELD | Attending: Physical Medicine & Rehabilitation

## 2014-12-01 VITALS — BP 146/68 | HR 68 | Resp 14

## 2014-12-01 DIAGNOSIS — M4806 Spinal stenosis, lumbar region: Secondary | ICD-10-CM | POA: Insufficient documentation

## 2014-12-01 DIAGNOSIS — Z5181 Encounter for therapeutic drug level monitoring: Secondary | ICD-10-CM | POA: Insufficient documentation

## 2014-12-01 DIAGNOSIS — M5030 Other cervical disc degeneration, unspecified cervical region: Secondary | ICD-10-CM | POA: Insufficient documentation

## 2014-12-01 DIAGNOSIS — M48062 Spinal stenosis, lumbar region with neurogenic claudication: Secondary | ICD-10-CM

## 2014-12-01 DIAGNOSIS — M47812 Spondylosis without myelopathy or radiculopathy, cervical region: Secondary | ICD-10-CM | POA: Insufficient documentation

## 2014-12-01 DIAGNOSIS — M4716 Other spondylosis with myelopathy, lumbar region: Secondary | ICD-10-CM | POA: Insufficient documentation

## 2014-12-01 MED ORDER — GABAPENTIN 300 MG PO CAPS: 300.0000 mg | ORAL_CAPSULE | Freq: Three times a day (TID) | ORAL | Status: DC

## 2014-12-01 NOTE — Progress Notes (Signed)
Subjective:    Patient ID: Courtney [name], female    DOB: 1950/07/02, 64 y.o.   MRN: [id number]  HPI 64 year old female with chronic pain is complaining today of increasing pain in the low back and legs. Patient points to the area around the sacral or sacroiliac area. Patient also complaining of pain radiating to the posterior thighs some numbness and tingling in the knees and some deeper pain in the mid tibial area anteriorly in the left lower extremity This pain occurs when she is walking she does not have any set distance that occurs at. She also has some symptoms when she is laying down. Bending forward and touching her toes is a position of comfort. She is denying any hip pain She still has some neck pain and is following up with Dr. Nelva Bush this week Pain Inventory Average Pain 9 Pain Right Now 9 My pain is constant, sharp, stabbing, tingling and aching  In the last 24 hours, has pain interfered with the following? General activity 9 Relation with others 8 Enjoyment of life 8 What TIME of day is your pain at its worst? morning and daytime Sleep (in general) Fair  Pain is worse with: walking, bending and standing Pain improves with: rest, pacing activities, medication and injections Relief from Meds: 6  Mobility walk without assistance use a walker how many minutes can you walk? 3-5 ability to climb steps?  yes do you drive?  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  no  Physicians involved in your care Any changes since last visit?  no   Family History  Problem Relation Age of Onset  . COPD Mother   . Cancer Mother   . Hypertension Mother   . Hyperlipidemia Mother   . Heart disease Mother    History   Social History  . Marital Status: Married    Spouse Name: N/A  . Number of Children: N/A  . Years of Education: N/A    Social History Main Topics  . Smoking status: Never Smoker   . Smokeless tobacco: Never Used  . Alcohol Use: 1.2 oz/week    2 Glasses of wine per week  . Drug Use: No  . Sexual Activity: No   Other Topics Concern  . None   Social History Narrative   Past Surgical History  Procedure Laterality Date  . Hernia repair     Past Medical History  Diagnosis Date  . Arthritis   . Depression   . Hyperlipidemia    BP 146/68 mmHg  Pulse 68  Resp 14  SpO2 97%  LMP  (LMP Unknown)  Opioid Risk Score:   Fall Risk Score: Low Fall Risk (0-5 points)`1  Depression screen PHQ 2/9  Depression screen PHQ 2/9 08/27/2014  Decreased Interest 1  Down, Depressed, Hopeless 1  PHQ - 2 Score 2  Altered sleeping 1  Tired, decreased energy 1  Change in appetite 0  Feeling bad or failure about yourself  0  Trouble concentrating 1  Moving slowly or fidgety/restless 0  Suicidal thoughts 0  PHQ-9 Score 5     Review of Systems  Constitutional: Negative.   HENT: Negative.   Eyes: Negative.   Respiratory: Negative.        Sleep apnea w/CPAP  Cardiovascular: Negative.   Gastrointestinal: Negative.   Endocrine: Negative.   Genitourinary: Negative.   Musculoskeletal:  Positive for myalgias, back pain and arthralgias.  Skin: Negative.   Allergic/Immunologic: Negative.   Neurological: Positive for numbness.       Tingling, trouble walking  Hematological: Negative.   Psychiatric/Behavioral: Negative.        Objective:   Physical Exam  Constitutional: She is oriented to person, place, and time. She appears well-developed and well-nourished.  HENT:  Head: Normocephalic and atraumatic.  Eyes: Pupils are equal, round, and reactive to light.  Neck: Normal range of motion.  Musculoskeletal:       Lumbar back: She exhibits decreased range of motion and tenderness. She exhibits no deformity.  Neurological: She is alert and oriented to person, place, and time.  Psychiatric: She has a normal  mood and affect.  Nursing note and vitals reviewed.   Patient has tenderness palpation bilateral PSIS area She also has some tenderness along the lumbar paraspinals around L4. She has full lumbar flexion without pain and she has limited lumbar extension with complaints of pain during attempted extension. Lateral bending is limited to about 25-50% bilaterally with mild pain Negative straight leg raise bilaterally Sensation is reduced in the left L4 dermatomal distribution      Assessment & Plan:  1. Chronic low back pain now with symptoms suggestive of neurogenic claudication. I look back at her MRI from [facility] from 2014. It showed moderate to severe stenosis at L3-4 central canal as well as foramen. She also had foraminal stenosis L5-S1 mainly due to facet arthropathy. No central stenosis at that level  We discussed thatin her symptomatology as well as her MRI findings would be worthwhile to get a surgical opinion on her low back to see whether a decompressive laminectomy would be of benefit. She is going to [place] to see Dr. Nelva Bush physical medicine rehabilitation for Cervical radiofrequency and I have sent him a message to request a referral to his partner orthopedic spine Surgeon Dr. Rolena Infante.

## 2014-12-01 NOTE — Patient Instructions (Signed)
I would recommend that you get evaluated by Dr. Rolena Infante for potential lumbar laminectomy and decompression for the diagnosis of lumbar spinal stenosis at L3-L4

## 2015-01-19 ENCOUNTER — Other Ambulatory Visit (HOSPITAL_COMMUNITY): Payer: Self-pay | Admitting: Orthopedic Surgery

## 2015-01-19 DIAGNOSIS — M5136 Other intervertebral disc degeneration, lumbar region: Secondary | ICD-10-CM

## 2015-01-22 ENCOUNTER — Telehealth: Payer: Self-pay | Admitting: Cardiovascular Disease

## 2015-01-22 NOTE — Telephone Encounter (Signed)
Received records from [facility] for appointment on 01/27/15 with Dr Claiborne Billings.  Records given to [facility] (medical records) for Dr Evette Georges schedule on 01/27/15. lp

## 2015-01-27 ENCOUNTER — Ambulatory Visit (INDEPENDENT_AMBULATORY_CARE_PROVIDER_SITE_OTHER): Payer: BLUE CROSS/BLUE SHIELD | Admitting: Cardiovascular Disease

## 2015-01-27 ENCOUNTER — Encounter: Payer: Self-pay | Admitting: Cardiovascular Disease

## 2015-01-27 VITALS — BP 122/74 | HR 72 | Ht 64.0 in | Wt 155.4 lb

## 2015-01-27 DIAGNOSIS — E785 Hyperlipidemia, unspecified: Secondary | ICD-10-CM

## 2015-01-27 DIAGNOSIS — R011 Cardiac murmur, unspecified: Secondary | ICD-10-CM

## 2015-01-27 DIAGNOSIS — Z8679 Personal history of other diseases of the circulatory system: Secondary | ICD-10-CM

## 2015-01-27 DIAGNOSIS — Z79899 Other long term (current) drug therapy: Secondary | ICD-10-CM

## 2015-01-27 DIAGNOSIS — Z01818 Encounter for other preprocedural examination: Secondary | ICD-10-CM

## 2015-01-27 DIAGNOSIS — M4716 Other spondylosis with myelopathy, lumbar region: Secondary | ICD-10-CM

## 2015-01-27 DIAGNOSIS — M5030 Other cervical disc degeneration, unspecified cervical region: Secondary | ICD-10-CM

## 2015-01-27 MED ORDER — ATORVASTATIN CALCIUM 40 MG PO TABS: 40.0000 mg | ORAL_TABLET | Freq: Every day | ORAL | Status: DC

## 2015-01-27 NOTE — Patient Instructions (Addendum)
Your physician recommends that you return for lab work in: 2 months fasting.  Your physician has recommended you make the following change in your medication: STOP pravastatin. This has been replaced with atorvastatin 40 mg. Take 1/2 tablet daily for 1-2 weeks. If no problems then increase to 1 tablet daily. The [facility].  Your physician recommends that you schedule a follow-up appointment in: 3-4 months following back surgery.  Your physician has requested that you have an echocardiogram. Echocardiography is a painless test that uses sound waves to create images of your heart. It provides your doctor with information about the size and shape of your heart and how well your heart's chambers and valves are working. This procedure takes approximately one hour. There are no restrictions for this procedure.

## 2015-01-28 ENCOUNTER — Ambulatory Visit (HOSPITAL_COMMUNITY)
Admission: RE | Admit: 2015-01-28 | Discharge: 2015-01-28 | Disposition: A | Discharge status: Home / Self Care | Payer: BLUE CROSS/BLUE SHIELD | Source: Ambulatory Visit | Attending: Orthopedic Surgery | Admitting: Orthopedic Surgery

## 2015-01-28 ENCOUNTER — Encounter (HOSPITAL_COMMUNITY)
Admission: RE | Admit: 2015-01-28 | Discharge: 2015-01-28 | Disposition: A | Discharge status: Home / Self Care | Payer: BLUE CROSS/BLUE SHIELD | Source: Ambulatory Visit | Attending: Orthopedic Surgery | Admitting: Orthopedic Surgery

## 2015-01-28 DIAGNOSIS — M5136 Other intervertebral disc degeneration, lumbar region: Secondary | ICD-10-CM

## 2015-01-28 MED ORDER — TECHNETIUM TC 99M MEDRONATE IV KIT
27.5000 | PACK | Freq: Once | INTRAVENOUS | Status: AC | PRN
  Administered 2015-01-28: 27.5 via INTRAVENOUS

## 2015-01-29 ENCOUNTER — Encounter: Payer: Self-pay | Admitting: Cardiovascular Disease

## 2015-01-29 DIAGNOSIS — E785 Hyperlipidemia, unspecified: Secondary | ICD-10-CM | POA: Insufficient documentation

## 2015-01-29 DIAGNOSIS — Z8679 Personal history of other diseases of the circulatory system: Secondary | ICD-10-CM | POA: Insufficient documentation

## 2015-01-29 DIAGNOSIS — Z01818 Encounter for other preprocedural examination: Secondary | ICD-10-CM | POA: Insufficient documentation

## 2015-01-29 NOTE — Progress Notes (Signed)
Patient ID: BRYANN GENTZ, female   DOB: 1951-03-17, 64 y.o.   MRN: [id number]     Primary M.D.: Dr. Danae Orleans  PATIENT PROFILE: Courtney [name] is a 64 y.o. female who is referred through the courtesy of Dr. Rolena Infante at [facility] prior to undergoing spine surgery.   HPI:  Courtney [name] has recently moved to the [place] area over the past year.  She is followed by Dr. Arby Barrette who is at [facility] in [place].  She admits to being told of having a mild heart murmur.  She denies any significant cardiac history.  Specifically, there is no chest pain or shortness of breath.  She has noticed rare to occasional palpitations.  She has a history of hyperlipidemia and has been on pravastatin 80 mg daily.  I was able to obtain results of blood work that she had done by her primary physician.  On 80 mg of Pravachol.  Her cholesterol was 192, triglycerides 227, HDL 42, and LDL cholesterol 105.  She also takes omega-3 fatty acids.  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  Upon travel to [place].  She would take antimalarial medication.  She has a history of GERD for which he takes omeprazole.  She has had issues with both cervical as well as lumbar spine problems.  She has had cervical pain as well as low back pain with radicular pain into her left upper extremity and bilateral lower extremities.  She has had physical therapy.  She was recently found to have significant lumbar disease and there is plans for decompression at levels L3-4 as well as L5-S1.  Her pain has been managed with Lyrica and gabapentin.  She presents for cardiology evaluation prior to planned surgery.  Past Medical History  Diagnosis Date  . Arthritis   . Depression   . Hyperlipidemia     Past Surgical History  Procedure Laterality Date  . Hernia repair      Allergies  Allergen Reactions  . Fish Allergy   . Peanuts [Peanut Oil]    ALL NUTS and their derivatives    Current Outpatient Prescriptions  Medication Sig Dispense Refill  . Ascorbic Acid (VITAMIN C PO) Take 1,000 mg by mouth daily.    Marland Kitchen aspirin 81 MG EC tablet Take 81 mg by mouth daily.    Marland Kitchen atovaquone-proguanil (MALARONE) 250-100 MG TABS Take 2 days prior to departure, daily for the duration of exposure to malaria, and daily for 1 week after return    . b complex vitamins capsule Take by mouth.    . Cholecalciferol (VITAMIN D-1000 MAX ST) 1000 UNITS tablet Take 1 tablet by mouth daily. Take 1 tab daily    . DULoxetine (CYMBALTA) 30 MG capsule Take by mouth daily.     . fluticasone (FLONASE) 50 MCG/ACT nasal spray     . lidocaine (LIDODERM) 5 % Place 1 patch onto the skin daily.     Marland Kitchen loratadine (CLARITIN) 10 MG tablet 1 tab by mouth daily as needed    . MAGNESIUM ASPARTATE HCL PO Take 1 tablet by mouth daily.    . Multiple Vitamins-Minerals (MULTIVITAMIN WITH MINERALS) tablet Take by mouth.    . Naproxen Sodium 220 MG CAPS 1 tab   twice a day    . Omega-3 Fatty Acids (OMEGA 3 PO) Take by mouth.    Marland Kitchen omeprazole (PRILOSEC) 20 MG capsule Take by mouth daily.     Marland Kitchen  polycarbophil (FIBERCON) 625 MG tablet Take by mouth.    . pregabalin (LYRICA) 100 MG capsule Take 1 capsule (100 mg total) by mouth 5 (five) times daily. 450 capsule 3  . PREMARIN vaginal cream Use as directed    . traZODone (DESYREL) 100 MG tablet Take 100 mg by mouth at bedtime as needed.     . urea (CARMOL) 40 % CREA APPLY TWICE A DAY AS NEEDED    . vitamin E 400 UNIT capsule Take 1 capsule by mouth daily. Take 1 tab every other day    . atorvastatin (LIPITOR) 40 MG tablet Take 1 tablet (40 mg total) by mouth daily. 90 tablet 3   No current facility-administered medications for this visit.    Social History   Social History  . Marital Status: Married    Spouse Name: N/A  . Number of Children: N/A  . Years of Education: N/A   Occupational History  . Not on file.   Social History Main  Topics  . Smoking status: Never Smoker   . Smokeless tobacco: Never Used  . Alcohol Use: 1.2 oz/week    2 Glasses of wine per week     Comment: socially  . Drug Use: No  . Sexual Activity: No   Other Topics Concern  . Not on file   Social History Narrative   Additional social history is notable in that she is in her third marriage for the past year.  She has 2 children.  There is no tobacco use, although she may have minimally smoked while in college.  There is only very rare use of alcohol.  Her exercise has been limited by her back discomfort.  Family History  Problem Relation Age of Onset  . COPD Mother   . Cancer Mother   . Hypertension Mother   . Hyperlipidemia Mother   . Heart disease Mother    Her mother died at age 96 with pancreatic cancer.  Her father died at age 74 with dementia.  Her sister, age 70, is alive and well.  ROS General: Negative; No fevers, chills, or night sweats HEENT: Negative; No changes in vision or hearing, sinus congestion, difficulty swallowing Pulmonary: Negative; No cough, wheezing, shortness of breath, hemoptysis Cardiovascular:  See HPI; No chest pain, presyncope, syncope, palpitations, edema GI: Negative; No nausea, vomiting, diarrhea, or abdominal pain GU: Negative; No dysuria, hematuria, or difficulty voiding Musculoskeletal: Cervical and lumbar discomfort Hematologic/Oncologic: Negative; no easy bruising, bleeding Endocrine: Negative; no heat/cold intolerance; no diabetes Neuro: Negative; no changes in balance, headaches Skin: Negative; No rashes or skin lesions Psychiatric: Negative; No behavioral problems, depression Sleep: Negative; No daytime sleepiness, hypersomnolence, bruxism, restless legs, hypnogagnic hallucinations Other comprehensive 14 point system review is negative   Physical Exam BP 122/74 mmHg  Pulse 72  Ht 5\' 4"  (1.626 m)  Wt 155 lb 6 oz (70.478 kg)  BMI 26.66 kg/m2  LMP  (LMP Unknown)  Wt Readings from Last 3  Encounters:  01/27/15 155 lb 6 oz (70.478 kg)   General: Alert, oriented, no distress.  Skin: normal turgor, no rashes, warm and dry HEENT: Normocephalic, atraumatic. Pupils equal round and reactive to light; sclera anicteric; extraocular muscles intact; Fundi normal Nose without nasal septal hypertrophy Mouth/Parynx benign; Mallinpatti scale 2 Neck: No JVD, no carotid bruits; normal carotid upstroke Lungs: clear to ausculatation and percussion; no wheezing or rales Chest wall: without tenderness to palpitation Heart: PMI not displaced, RRR, s1 s2 normal, 1/6 systolic murmur, no diastolic  murmur, no rubs, gallops, thrills, or heaves Abdomen: soft, nontender; no hepatosplenomehaly, BS+; abdominal aorta nontender and not dilated by palpation. Back: no CVA tenderness Pulses 2+ Musculoskeletal: full range of motion, normal strength, no joint deformities Extremities: no clubbing cyanosis or edema, Homan's sign negative  Neurologic: grossly nonfocal; Cranial nerves grossly wnl Psychologic: Normal mood and affect   ECG (independently read by me): Normal sinus rhythm at 72 bpm.  Normal intervals.  No significant ST changes.  LABS:  Blood work from her primary physician on 09/11/2014.  Reviewed:   Total cholesterol 192, triglycerides 227, HDL 42, LDL cholesterol 105. Hemoglobin/hematocrit 12.4/36.4 Chemistry within normal limits TSH 1.67   No flowsheet data found.   No flowsheet data found.  No flowsheet data found. No results found for: MCV No results found for: TSH No results found for: HGBA1C   BNP No results found for: BNP  ProBNP No results found for: PROBNP   Lipid Panel  No results found for: CHOL, TRIG, HDL, CHOLHDL, VLDL, LDLCALC, LDLDIRECT  RADIOLOGY: Nm Bone Scan Whole Body  01/28/2015   CLINICAL DATA:  Lumbar DJD.  EXAM: NUCLEAR MEDICINE WHOLE BODY BONE SCAN  TECHNIQUE: Whole body anterior and posterior images were obtained approximately 3 hours after  intravenous injection of radiopharmaceutical.  RADIOPHARMACEUTICALS:  27.5 mCi Technetium-36m MDP IV  COMPARISON:  None.  FINDINGS: Bilateral renal function excretion. Increased activity noted about the sinuses suggesting sinus disease. Increased activity noted about the maxilla and mandible possibly secondary to dental disease. No other focal abnormalities identified.  IMPRESSION: 1. Activity noted about the sinuses suggesting sinus disease. 2. Activity noted about the maxilla mandible, possibly related to dental disease. Maxillofacial CT may prove useful for further evaluation.   Electronically Signed   By: Marcello Moores  Register   On: 01/28/2015 15:55     ASSESSMENT AND PLAN: Ms. Jia Dottavio, Junior is a very pleasant 89-year-old female who is in need for lumbar back surgery due to progressive degenerative disc disease with moderate to severe canal stenosis.  She denies any known significant cardiac history except being told of having a mild heart murmur.  Her blood pressure today is stable at 122/74.  On physical examination she is euvolemic.  There is a faint 1/6 systolic murmur, which I do not believe is significant.  I am recommending that she undergo a 2-d echo Doppler study for definitive assessment of her cardiac murmur and 2 to assess systolic and diastolic function.  Her ECG is normal.  He does not have any history of known CAD or symptoms suggestive of this She is stable from a cardiovascular standpoint to undergo planned surgery.  I reviewed the recent blood work which was obtained by her primary physician.  Despite being on maximal dose pravastatin her lipid studies remain suboptimal.  I have suggested she discontinue Pravachol and in its place will start atorvastatin 40 mg.  With her continued elevation of triglycerides I also have recommended she increase her omega-3 fatty acids to 2 capsules twice a day.  Follow-up laboratory will be obtained in several months and I will see her in 3 months for  follow-up evaluation.   Troy Sine, MD, [facility] 01/29/2015 9:12 AM

## 2015-02-10 ENCOUNTER — Other Ambulatory Visit: Payer: Self-pay

## 2015-02-10 ENCOUNTER — Ambulatory Visit (HOSPITAL_COMMUNITY): Payer: BLUE CROSS/BLUE SHIELD | Attending: Cardiovascular Disease

## 2015-02-10 DIAGNOSIS — R011 Cardiac murmur, unspecified: Secondary | ICD-10-CM

## 2015-02-10 DIAGNOSIS — E785 Hyperlipidemia, unspecified: Secondary | ICD-10-CM | POA: Diagnosis not present

## 2015-03-02 ENCOUNTER — Telehealth: Payer: Self-pay | Admitting: *Deleted

## 2015-03-02 ENCOUNTER — Encounter: Payer: BLUE CROSS/BLUE SHIELD | Attending: Physical Medicine & Rehabilitation

## 2015-03-02 ENCOUNTER — Ambulatory Visit: Payer: BLUE CROSS/BLUE SHIELD | Admitting: Physical Medicine & Rehabilitation

## 2015-03-02 DIAGNOSIS — Z5181 Encounter for therapeutic drug level monitoring: Secondary | ICD-10-CM | POA: Insufficient documentation

## 2015-03-02 DIAGNOSIS — M47812 Spondylosis without myelopathy or radiculopathy, cervical region: Secondary | ICD-10-CM | POA: Insufficient documentation

## 2015-03-02 DIAGNOSIS — M4716 Other spondylosis with myelopathy, lumbar region: Secondary | ICD-10-CM | POA: Insufficient documentation

## 2015-03-02 DIAGNOSIS — M5030 Other cervical disc degeneration, unspecified cervical region: Secondary | ICD-10-CM | POA: Insufficient documentation

## 2015-03-02 DIAGNOSIS — M4806 Spinal stenosis, lumbar region: Secondary | ICD-10-CM | POA: Insufficient documentation

## 2015-03-02 NOTE — Telephone Encounter (Signed)
Courtney [name] cannot be here for her appt today and would like to speak with Dr Letta Pate over the phone [phone]

## 2015-03-10 ENCOUNTER — Telehealth: Payer: Self-pay | Admitting: *Deleted

## 2015-03-10 NOTE — Telephone Encounter (Signed)
Faxed surgical clearance to [facility] for patient to have back surgery scheduled to be done by Dr. Melina Schools. Patient has been cleared to have surgery.

## 2015-04-12 ENCOUNTER — Encounter: Payer: BLUE CROSS/BLUE SHIELD | Attending: Physical Medicine & Rehabilitation

## 2015-04-12 ENCOUNTER — Ambulatory Visit (HOSPITAL_BASED_OUTPATIENT_CLINIC_OR_DEPARTMENT_OTHER): Payer: BLUE CROSS/BLUE SHIELD | Admitting: Physical Medicine & Rehabilitation

## 2015-04-12 ENCOUNTER — Encounter: Payer: Self-pay | Admitting: Physical Medicine & Rehabilitation

## 2015-04-12 VITALS — BP 121/66 | HR 77 | Resp 14

## 2015-04-12 DIAGNOSIS — M4806 Spinal stenosis, lumbar region: Secondary | ICD-10-CM | POA: Diagnosis not present

## 2015-04-12 DIAGNOSIS — M4716 Other spondylosis with myelopathy, lumbar region: Secondary | ICD-10-CM | POA: Diagnosis not present

## 2015-04-12 DIAGNOSIS — M25551 Pain in right hip: Secondary | ICD-10-CM | POA: Diagnosis not present

## 2015-04-12 DIAGNOSIS — M48062 Spinal stenosis, lumbar region with neurogenic claudication: Secondary | ICD-10-CM

## 2015-04-12 DIAGNOSIS — M5030 Other cervical disc degeneration, unspecified cervical region: Secondary | ICD-10-CM | POA: Insufficient documentation

## 2015-04-12 DIAGNOSIS — Z5181 Encounter for therapeutic drug level monitoring: Secondary | ICD-10-CM | POA: Diagnosis present

## 2015-04-12 DIAGNOSIS — M47812 Spondylosis without myelopathy or radiculopathy, cervical region: Secondary | ICD-10-CM | POA: Insufficient documentation

## 2015-04-12 MED ORDER — GABAPENTIN 400 MG PO CAPS: 400.0000 mg | ORAL_CAPSULE | Freq: Three times a day (TID) | ORAL | Status: DC

## 2015-04-12 NOTE — Patient Instructions (Addendum)
Dr Rolena Infante will manage pain post operative  Good luck with your surgery

## 2015-04-12 NOTE — Progress Notes (Signed)
Subjective:    Patient ID: Courtney [name], female    DOB: 1950/08/22, 64 y.o.   MRN: [id number] Chief complaint is hip pain on the Right side, patient not sure whether it's coming from her back or is a separate issue  HPI  64 year old female with history of lumbar spinal stenosis severe at L3-L4 with both central and foraminal stenosis. She has foraminal stenosis at at L5-S1 as well. She has been referred to a spine surgeon and is planning to have a decompressive procedure next month. She cannot tell me many details about this but knows she is undergoing surgery. She has had radiofrequency procedures On the left at L2 L3 L4 Medial branches as well as L5 dorsal ramusFor axial back pain Right hip mainly in side and needs support to stand.  Stairs an issue now. No falls or trauma Balance is worse, unsteady No numbness in feet  No bowel or bladder dysfunction  Patient states that her pain is mainly in the posterior hip as well as right lateral hip and not so much in the groin area. Walking does seem to exacerbate the pain  No progressive weakness in the lower extremities. Pain Inventory Average Pain 9 Pain Right Now 7 My pain is constant, sharp, tingling and aching  In the last 24 hours, has pain interfered with the following? General activity 9 Relation with others 6 Enjoyment of life 6 What TIME of day is your pain at its worst? morning Sleep (in general) Fair  Pain is worse with: walking, bending, standing and some activites Pain improves with: rest, pacing activities and medication Relief from Meds: 6  Mobility walk with assistance use a cane how many minutes can you walk? 5-10 ability to climb steps?  yes do you drive?  yes Do you have any goals in this area?  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  no  Physicians involved in your care Any changes since last visit?  no   Family History  Problem Relation Age of Onset  . COPD Mother   . Cancer Mother   . Hypertension Mother   . Hyperlipidemia Mother   . Heart disease Mother    Social History   Social History  . Marital Status: Married    Spouse Name: N/A  . Number of Children: N/A  . Years of Education: N/A   Social History Main Topics  . Smoking status: Never Smoker   . Smokeless tobacco: Never Used  . Alcohol Use: 1.2 oz/week    2 Glasses of wine per week     Comment: socially  . Drug Use: No  . Sexual Activity: No   Other Topics Concern  . None   Social History Narrative   Past Surgical History  Procedure Laterality Date  . Hernia repair     Past Medical History  Diagnosis Date  . Arthritis   . Depression   . Hyperlipidemia    BP 121/66 mmHg  Pulse 77  Resp 14  SpO2 98%  LMP  (LMP Unknown)  Opioid Risk Score:   Fall Risk Score:  `1  Depression screen PHQ 2/9  Depression screen PHQ 2/9 08/27/2014  Decreased Interest 1  Down, Depressed, Hopeless 1  PHQ - 2 Score 2  Altered sleeping 1  Tired, decreased energy 1  Change in appetite 0  Feeling bad or failure about yourself  0  Trouble concentrating 1  Moving slowly or fidgety/restless 0  Suicidal thoughts 0  PHQ-9 Score 5     Review of Systems  Musculoskeletal: Positive for gait problem.  Neurological: Positive for weakness.       Tingling  All other systems reviewed and are negative.      Objective:   Physical Exam  Constitutional: She is oriented to person, place, and time. She appears well-developed and well-nourished.  HENT:  Head: Normocephalic and atraumatic.  Eyes: Conjunctivae and EOM are normal. Pupils are equal, round, and reactive to light.  Neck: Normal range of motion.  Neurological: She is alert and oriented to person, place, and time.  Reflex Scores:      Patellar reflexes  are 1+ on the right side and 1+ on the left side.      Achilles reflexes are 1+ on the right side and 1+ on the left side. No light touch or pinprick sensory loss in bilateral lower extremities Motor strength is 5/5 bilateral hip flexor knee extensor ankle dorsiflexor and plantar flexor  Psychiatric: She has a normal mood and affect.  Nursing note and vitals reviewed.  Patient has normal range of motion in bilateral hips with internal/external rotation flexion-extension. Negative femoral stretch test On the left, equivocal on the right with some end range stretching in the front of the right thigh Negative straight leg raising No tenderness over the trochanteric bursa bilaterally      Assessment & Plan:  #1. Lumbar spinal stenosis she has thigh pain which is most likely related to her L3-L4 central and foraminal stenosis.  #2. Right hip pain undetermined etiology. Certainly could be related to her lumbar spine issues however cannot definitely rule out osteoarthritis. Does not appear to be trochanteric bursitis. I recommend that she follows up after surgery and we may need to do some further evaluation which may include either x-rays or diagnostic injections.  Dr. Rolena Infante will be handling postoperative pain medications for the first couple months.

## 2015-04-13 ENCOUNTER — Telehealth: Payer: Self-pay | Admitting: *Deleted

## 2015-04-13 NOTE — Telephone Encounter (Signed)
Request for surgical clearance:  1. What type of surgery is being performed? Spine: Decompression L3-4, central decompressive laminectomy-(spinal stenosis) each add segments cervical, thoracic or lumbar.  2. When is this surgery scheduled? Pending clearance  3. Are there any medications that need to be held prior to surgery and how long? ASA 81 MG  4. Name of physician performing surgery?  [place]  5. What is your office phone and fax number?  Phone: [phone]  Fax: [phone]

## 2015-04-14 ENCOUNTER — Ambulatory Visit: Payer: Self-pay | Admitting: Physician Assistant

## 2015-04-14 ENCOUNTER — Telehealth: Payer: Self-pay | Admitting: Cardiovascular Disease

## 2015-04-14 NOTE — Telephone Encounter (Signed)
Received a call from [facility] with [place] she will fax new surgical clearance form for Dr.Kelly to sign.  Received surgical clearance form.Form gave to Mariann Laster she will have Dr.Kelly sign and fax back to [place].

## 2015-04-14 NOTE — Telephone Encounter (Signed)
Patient was given clearance back in August for surgery. Surgery is scheduled for November 9th. Would like another clearance to make sure she is still ok to proceed. / tg

## 2015-04-14 NOTE — Telephone Encounter (Signed)
[place] for surgery; can hold ASA for 5 - 7 days

## 2015-04-14 NOTE — Telephone Encounter (Signed)
Returned call to [employer] with [employer].Left message on personal voice mail to refax surgical clearance form.

## 2015-04-14 NOTE — Telephone Encounter (Signed)
Routed to [facility] orthopaedics @ [phone].

## 2015-04-16 ENCOUNTER — Encounter (HOSPITAL_COMMUNITY): Payer: Self-pay

## 2015-04-16 ENCOUNTER — Encounter (HOSPITAL_COMMUNITY)
Admission: RE | Admit: 2015-04-16 | Discharge: 2015-04-16 | Disposition: A | Discharge status: Home / Self Care | Payer: BLUE CROSS/BLUE SHIELD | Source: Ambulatory Visit | Attending: Orthopedic Surgery | Admitting: Orthopedic Surgery

## 2015-04-16 DIAGNOSIS — M4806 Spinal stenosis, lumbar region: Secondary | ICD-10-CM | POA: Diagnosis not present

## 2015-04-16 DIAGNOSIS — Z79899 Other long term (current) drug therapy: Secondary | ICD-10-CM | POA: Diagnosis not present

## 2015-04-16 DIAGNOSIS — Z7982 Long term (current) use of aspirin: Secondary | ICD-10-CM | POA: Diagnosis not present

## 2015-04-16 DIAGNOSIS — E785 Hyperlipidemia, unspecified: Secondary | ICD-10-CM | POA: Insufficient documentation

## 2015-04-16 DIAGNOSIS — Z01812 Encounter for preprocedural laboratory examination: Secondary | ICD-10-CM | POA: Insufficient documentation

## 2015-04-16 DIAGNOSIS — Z01818 Encounter for other preprocedural examination: Secondary | ICD-10-CM | POA: Diagnosis not present

## 2015-04-16 HISTORY — DX: Fibromyalgia: M79.7

## 2015-04-16 LAB — SURGICAL PCR SCREEN
MRSA, PCR: NEGATIVE
STAPHYLOCOCCUS AUREUS: NEGATIVE

## 2015-04-16 LAB — BASIC METABOLIC PANEL
ANION GAP: 7 (ref 5–15)
BUN: 15 mg/dL (ref 6–20)
CALCIUM: 9.7 mg/dL (ref 8.9–10.3)
CO2: 26 mmol/L (ref 22–32)
Chloride: 106 mmol/L (ref 101–111)
Creatinine, Ser: 0.78 mg/dL (ref 0.44–1.00)
Glucose, Bld: 117 mg/dL — ABNORMAL HIGH (ref 65–99)
Potassium: 4.2 mmol/L (ref 3.5–5.1)
SODIUM: 139 mmol/L (ref 135–145)

## 2015-04-16 LAB — CBC
HEMATOCRIT: 36.8 % (ref 36.0–46.0)
HEMOGLOBIN: 12.5 g/dL (ref 12.0–15.0)
MCH: 30.9 pg (ref 26.0–34.0)
MCHC: 34 g/dL (ref 30.0–36.0)
MCV: 90.9 fL (ref 78.0–100.0)
Platelets: 191 10*3/uL (ref 150–400)
RBC: 4.05 MIL/uL (ref 3.87–5.11)
RDW: 12.2 % (ref 11.5–15.5)
WBC: 5.4 10*3/uL (ref 4.0–10.5)

## 2015-04-16 NOTE — Pre-Procedure Instructions (Signed)
    Courtney [name]  04/16/2015      [facility] - [place], [place] [address] [place] [address] Phone: [phone] Fax: [phone]  [facility] [place], [place] [address] Phone: [phone] Fax: [phone]    Your procedure is scheduled on Wednesday April 21, 2015  Report to [facility] Admitting at 8:30A.M.  Call this number if you have problems the morning of surgery:  [phone]   Remember:  Do not eat food or drink liquids after midnight.  Take these medicines the morning of surgery with A SIP OF WATER  Cymbalta, Flonase, Claritin, Prilosec, Lyrica.  Discontinue taking all herbal supplements, vitamins, fish oils, aspirin products, ibuprofen or naproxen containing products five days prior to your surgery.   Do not wear jewelry, make-up or nail polish.  Do not wear lotions, powders, or perfumes.  You may wear deodorant.  Do not shave 48 hours prior to surgery.    Do not bring valuables to the hospital.  [facility] is not responsible for any belongings or valuables.  Contacts, dentures or bridgework may not be worn into surgery.  Leave your suitcase in the car.  After surgery it may be brought to your room.  For patients admitted to the hospital, discharge time will be determined by your treatment team.  Patients discharged the day of surgery will not be allowed to drive home.      Please read over the following fact sheets that you were given. Pain Booklet, Coughing and Deep Breathing, MRSA Information and Surgical Site Infection Prevention

## 2015-04-16 NOTE — Progress Notes (Signed)
Anesthesia Chart Review:  Pt is 64 year old female scheduled for L3-4 decompression on 04/21/2015 with Dr. Rolena Infante.   PMH includes: hyperlipidemia. Never smoker. BMI 26.   Medications include: ASA, lipitor, atovaquone-proguanil, prilosec, lyrica.   Preoperative labs reviewed.    EKG 01/27/15: NSR.   Echo 02/10/15: - Left ventricle: The cavity size was normal. Wall thickness was normal. Systolic function was normal. The estimated ejection fraction was in the range of 55% to 60%. Wall motion was normal; there were no regional wall motion abnormalities. - Aortic valve: Bicuspid; mildly thickened leaflets. Transvalvular velocity was within the normal range. There was no stenosis. There was no significant regurgitation.  Pt saw Dr. Shelva Majestic with cardiology for pre-op eval. Dr. Claiborne Billings has cleared pt for surgery in Epic telephone encounter dated 04/14/15.   If no changes, I anticipate pt can proceed with surgery as scheduled.   Willeen Cass, FNP-BC [facility] Short Stay Surgical Center/Anesthesiology Phone: [phone] 04/16/2015 1:40 PM

## 2015-04-20 MED ORDER — CEFAZOLIN SODIUM-DEXTROSE 2-3 GM-% IV SOLR
2.0000 g | INTRAVENOUS | Status: AC
  Administered 2015-04-21: 2 g via INTRAVENOUS
  Filled 2015-04-20: qty 50

## 2015-04-21 ENCOUNTER — Encounter (HOSPITAL_COMMUNITY): Payer: Self-pay

## 2015-04-21 ENCOUNTER — Encounter (HOSPITAL_COMMUNITY)
Admission: RE | Disposition: A | Discharge status: Home / Self Care | Payer: Self-pay | Source: Ambulatory Visit | Attending: Orthopedic Surgery

## 2015-04-21 ENCOUNTER — Ambulatory Visit (HOSPITAL_COMMUNITY): Payer: BLUE CROSS/BLUE SHIELD

## 2015-04-21 ENCOUNTER — Ambulatory Visit (HOSPITAL_COMMUNITY): Payer: BLUE CROSS/BLUE SHIELD | Admitting: Certified Registered Nurse Anesthetist

## 2015-04-21 ENCOUNTER — Ambulatory Visit (HOSPITAL_COMMUNITY): Payer: BLUE CROSS/BLUE SHIELD | Admitting: Emergency Medicine

## 2015-04-21 ENCOUNTER — Observation Stay (HOSPITAL_COMMUNITY)
Admission: RE | Admit: 2015-04-21 | Discharge: 2015-04-22 | Disposition: A | Discharge status: Home / Self Care | Payer: BLUE CROSS/BLUE SHIELD | Source: Ambulatory Visit | Attending: Orthopedic Surgery | Admitting: Orthopedic Surgery

## 2015-04-21 DIAGNOSIS — M1990 Unspecified osteoarthritis, unspecified site: Secondary | ICD-10-CM | POA: Insufficient documentation

## 2015-04-21 DIAGNOSIS — Z91013 Allergy to seafood: Secondary | ICD-10-CM | POA: Insufficient documentation

## 2015-04-21 DIAGNOSIS — M4806 Spinal stenosis, lumbar region: Principal | ICD-10-CM | POA: Insufficient documentation

## 2015-04-21 DIAGNOSIS — K219 Gastro-esophageal reflux disease without esophagitis: Secondary | ICD-10-CM | POA: Insufficient documentation

## 2015-04-21 DIAGNOSIS — Z91010 Allergy to peanuts: Secondary | ICD-10-CM | POA: Diagnosis not present

## 2015-04-21 DIAGNOSIS — F329 Major depressive disorder, single episode, unspecified: Secondary | ICD-10-CM | POA: Insufficient documentation

## 2015-04-21 DIAGNOSIS — M797 Fibromyalgia: Secondary | ICD-10-CM | POA: Insufficient documentation

## 2015-04-21 DIAGNOSIS — Z79899 Other long term (current) drug therapy: Secondary | ICD-10-CM | POA: Diagnosis not present

## 2015-04-21 DIAGNOSIS — Z7982 Long term (current) use of aspirin: Secondary | ICD-10-CM | POA: Diagnosis not present

## 2015-04-21 DIAGNOSIS — Z419 Encounter for procedure for purposes other than remedying health state, unspecified: Secondary | ICD-10-CM

## 2015-04-21 DIAGNOSIS — Z9889 Other specified postprocedural states: Secondary | ICD-10-CM

## 2015-04-21 DIAGNOSIS — M4800 Spinal stenosis, site unspecified: Secondary | ICD-10-CM | POA: Diagnosis present

## 2015-04-21 DIAGNOSIS — Z87891 Personal history of nicotine dependence: Secondary | ICD-10-CM | POA: Insufficient documentation

## 2015-04-21 DIAGNOSIS — E785 Hyperlipidemia, unspecified: Secondary | ICD-10-CM | POA: Insufficient documentation

## 2015-04-21 HISTORY — PX: LUMBAR LAMINECTOMY/DECOMPRESSION MICRODISCECTOMY: SHX5026

## 2015-04-21 SURGERY — LUMBAR LAMINECTOMY/DECOMPRESSION MICRODISCECTOMY 1 LEVEL
Anesthesia: General | Site: Back

## 2015-04-21 MED ORDER — PREGABALIN 50 MG PO CAPS
100.0000 mg | ORAL_CAPSULE | Freq: Every day | ORAL | Status: DC
Start: 2015-04-21 — End: 2015-04-22
  Filled 2015-04-21 (×2): qty 2

## 2015-04-21 MED ORDER — PHENOL 1.4 % MT LIQD: 1.0000 | OROMUCOSAL | Status: DC | PRN

## 2015-04-21 MED ORDER — SODIUM CHLORIDE 0.9 % IJ SOLN
3.0000 mL | Freq: Two times a day (BID) | INTRAMUSCULAR | Status: DC
  Administered 2015-04-21: 3 mL via INTRAVENOUS

## 2015-04-21 MED ORDER — PROMETHAZINE HCL 25 MG/ML IJ SOLN: 6.2500 mg | INTRAMUSCULAR | Status: DC | PRN

## 2015-04-21 MED ORDER — BUPIVACAINE-EPINEPHRINE 0.25% -1:200000 IJ SOLN
INTRAMUSCULAR | Status: DC | PRN
  Administered 2015-04-21: 10 mL

## 2015-04-21 MED ORDER — ARTIFICIAL TEARS OP OINT
TOPICAL_OINTMENT | OPHTHALMIC | Status: AC
  Filled 2015-04-21: qty 3.5

## 2015-04-21 MED ORDER — GLYCOPYRROLATE 0.2 MG/ML IJ SOLN
INTRAMUSCULAR | Status: DC | PRN
  Administered 2015-04-21: 0.4 mg via INTRAVENOUS
  Administered 2015-04-21: 0.1 mg via INTRAVENOUS

## 2015-04-21 MED ORDER — DEXAMETHASONE 4 MG PO TABS
4.0000 mg | ORAL_TABLET | Freq: Four times a day (QID) | ORAL | Status: AC
  Administered 2015-04-21 – 2015-04-22 (×3): 4 mg via ORAL
  Filled 2015-04-21 (×3): qty 1

## 2015-04-21 MED ORDER — FENTANYL CITRATE (PF) 250 MCG/5ML IJ SOLN
INTRAMUSCULAR | Status: AC
  Filled 2015-04-21: qty 5

## 2015-04-21 MED ORDER — NEOSTIGMINE METHYLSULFATE 10 MG/10ML IV SOLN
INTRAVENOUS | Status: AC
  Filled 2015-04-21: qty 1

## 2015-04-21 MED ORDER — MIDAZOLAM HCL 2 MG/2ML IJ SOLN
INTRAMUSCULAR | Status: AC
  Filled 2015-04-21: qty 4

## 2015-04-21 MED ORDER — METHOCARBAMOL 500 MG PO TABS: 500.0000 mg | ORAL_TABLET | Freq: Three times a day (TID) | ORAL | Status: DC | PRN

## 2015-04-21 MED ORDER — FENTANYL CITRATE (PF) 100 MCG/2ML IJ SOLN
INTRAMUSCULAR | Status: AC
  Administered 2015-04-21: 50 ug via INTRAVENOUS
  Filled 2015-04-21: qty 2

## 2015-04-21 MED ORDER — OXYCODONE HCL 5 MG PO TABS
10.0000 mg | ORAL_TABLET | ORAL | Status: DC | PRN
  Administered 2015-04-21 – 2015-04-22 (×5): 10 mg via ORAL
  Filled 2015-04-21 (×4): qty 2

## 2015-04-21 MED ORDER — MAGNESIUM CITRATE PO SOLN
0.5000 | Freq: Once | ORAL | Status: AC
  Administered 2015-04-21: 0.5 via ORAL
  Filled 2015-04-21: qty 296

## 2015-04-21 MED ORDER — LACTATED RINGERS IV SOLN: INTRAVENOUS | Status: DC

## 2015-04-21 MED ORDER — GLYCOPYRROLATE 0.2 MG/ML IJ SOLN
INTRAMUSCULAR | Status: AC
  Filled 2015-04-21: qty 1

## 2015-04-21 MED ORDER — ONDANSETRON HCL 4 MG/2ML IJ SOLN
INTRAMUSCULAR | Status: AC
  Filled 2015-04-21: qty 2

## 2015-04-21 MED ORDER — FENTANYL CITRATE (PF) 250 MCG/5ML IJ SOLN
INTRAMUSCULAR | Status: DC | PRN
  Administered 2015-04-21: 75 ug via INTRAVENOUS
  Administered 2015-04-21: 25 ug via INTRAVENOUS
  Administered 2015-04-21 (×3): 50 ug via INTRAVENOUS

## 2015-04-21 MED ORDER — 0.9 % SODIUM CHLORIDE (POUR BTL) OPTIME
TOPICAL | Status: DC | PRN
  Administered 2015-04-21: 1000 mL

## 2015-04-21 MED ORDER — ONDANSETRON HCL 4 MG/2ML IJ SOLN
INTRAMUSCULAR | Status: DC | PRN
  Administered 2015-04-21: 4 mg via INTRAVENOUS

## 2015-04-21 MED ORDER — LIDOCAINE HCL (CARDIAC) 20 MG/ML IV SOLN
INTRAVENOUS | Status: AC
  Filled 2015-04-21: qty 5

## 2015-04-21 MED ORDER — DEXAMETHASONE SODIUM PHOSPHATE 4 MG/ML IJ SOLN: 4.0000 mg | Freq: Four times a day (QID) | INTRAMUSCULAR | Status: AC

## 2015-04-21 MED ORDER — HEMOSTATIC AGENTS (NO CHARGE) OPTIME
TOPICAL | Status: DC | PRN
  Administered 2015-04-21: 1 via TOPICAL

## 2015-04-21 MED ORDER — ONDANSETRON HCL 4 MG PO TABS: 4.0000 mg | ORAL_TABLET | Freq: Three times a day (TID) | ORAL | Status: DC | PRN

## 2015-04-21 MED ORDER — ONDANSETRON HCL 4 MG/2ML IJ SOLN: 4.0000 mg | INTRAMUSCULAR | Status: DC | PRN

## 2015-04-21 MED ORDER — ROCURONIUM BROMIDE 50 MG/5ML IV SOLN
INTRAVENOUS | Status: AC
  Filled 2015-04-21: qty 1

## 2015-04-21 MED ORDER — MENTHOL 3 MG MT LOZG: 1.0000 | LOZENGE | OROMUCOSAL | Status: DC | PRN

## 2015-04-21 MED ORDER — LIDOCAINE HCL (CARDIAC) 20 MG/ML IV SOLN
INTRAVENOUS | Status: DC | PRN
  Administered 2015-04-21: 100 mg via INTRAVENOUS

## 2015-04-21 MED ORDER — THROMBIN 20000 UNITS EX SOLR
CUTANEOUS | Status: AC
  Filled 2015-04-21: qty 20000

## 2015-04-21 MED ORDER — LACTATED RINGERS IV SOLN
INTRAVENOUS | Status: DC
  Administered 2015-04-21 (×2): via INTRAVENOUS

## 2015-04-21 MED ORDER — CEFAZOLIN SODIUM 1-5 GM-% IV SOLN
1.0000 g | Freq: Three times a day (TID) | INTRAVENOUS | Status: AC
  Administered 2015-04-21 – 2015-04-22 (×2): 1 g via INTRAVENOUS
  Filled 2015-04-21 (×2): qty 50

## 2015-04-21 MED ORDER — GLYCOPYRROLATE 0.2 MG/ML IJ SOLN
INTRAMUSCULAR | Status: AC
  Filled 2015-04-21: qty 2

## 2015-04-21 MED ORDER — ROCURONIUM BROMIDE 100 MG/10ML IV SOLN
INTRAVENOUS | Status: DC | PRN
  Administered 2015-04-21: 10 mg via INTRAVENOUS
  Administered 2015-04-21: 50 mg via INTRAVENOUS

## 2015-04-21 MED ORDER — OXYCODONE HCL 5 MG PO TABS
ORAL_TABLET | ORAL | Status: AC
  Filled 2015-04-21: qty 2

## 2015-04-21 MED ORDER — DEXTROSE 5 % IV SOLN
500.0000 mg | Freq: Four times a day (QID) | INTRAVENOUS | Status: DC | PRN
  Filled 2015-04-21: qty 5

## 2015-04-21 MED ORDER — FENTANYL CITRATE (PF) 100 MCG/2ML IJ SOLN
25.0000 ug | INTRAMUSCULAR | Status: DC | PRN
  Administered 2015-04-21 (×2): 50 ug via INTRAVENOUS

## 2015-04-21 MED ORDER — METHOCARBAMOL 500 MG PO TABS
ORAL_TABLET | ORAL | Status: AC
  Filled 2015-04-21: qty 1

## 2015-04-21 MED ORDER — DEXAMETHASONE SODIUM PHOSPHATE 10 MG/ML IJ SOLN
INTRAMUSCULAR | Status: DC | PRN
  Administered 2015-04-21: 10 mg via INTRAVENOUS

## 2015-04-21 MED ORDER — MORPHINE SULFATE (PF) 2 MG/ML IV SOLN: 1.0000 mg | INTRAVENOUS | Status: DC | PRN

## 2015-04-21 MED ORDER — THROMBIN 20000 UNITS EX SOLR
CUTANEOUS | Status: DC | PRN
  Administered 2015-04-21: 20 mL via TOPICAL

## 2015-04-21 MED ORDER — ATORVASTATIN CALCIUM 20 MG PO TABS
40.0000 mg | ORAL_TABLET | Freq: Every day | ORAL | Status: DC
  Filled 2015-04-21: qty 2

## 2015-04-21 MED ORDER — FLEET ENEMA 7-19 GM/118ML RE ENEM: 1.0000 | ENEMA | Freq: Once | RECTAL | Status: DC

## 2015-04-21 MED ORDER — PROPOFOL 10 MG/ML IV BOLUS
INTRAVENOUS | Status: DC | PRN
  Administered 2015-04-21: 150 mg via INTRAVENOUS
  Administered 2015-04-21: 50 mg via INTRAVENOUS

## 2015-04-21 MED ORDER — NEOSTIGMINE METHYLSULFATE 10 MG/10ML IV SOLN
INTRAVENOUS | Status: DC | PRN
  Administered 2015-04-21: 3.5 mg via INTRAVENOUS

## 2015-04-21 MED ORDER — DULOXETINE HCL 20 MG PO CPEP
40.0000 mg | ORAL_CAPSULE | Freq: Every morning | ORAL | Status: DC
  Filled 2015-04-21: qty 2

## 2015-04-21 MED ORDER — METHOCARBAMOL 500 MG PO TABS
500.0000 mg | ORAL_TABLET | Freq: Four times a day (QID) | ORAL | Status: DC | PRN
  Administered 2015-04-21 – 2015-04-22 (×4): 500 mg via ORAL
  Filled 2015-04-21 (×3): qty 1

## 2015-04-21 MED ORDER — PROPOFOL 10 MG/ML IV BOLUS
INTRAVENOUS | Status: AC
  Filled 2015-04-21: qty 20

## 2015-04-21 MED ORDER — BUPIVACAINE-EPINEPHRINE (PF) 0.25% -1:200000 IJ SOLN
INTRAMUSCULAR | Status: AC
  Filled 2015-04-21: qty 30

## 2015-04-21 MED ORDER — OXYCODONE-ACETAMINOPHEN 10-325 MG PO TABS: 1.0000 | ORAL_TABLET | ORAL | Status: DC | PRN

## 2015-04-21 MED ORDER — SODIUM CHLORIDE 0.9 % IJ SOLN: 3.0000 mL | INTRAMUSCULAR | Status: DC | PRN

## 2015-04-21 SURGICAL SUPPLY — 64 items
BUR EGG ELITE 4.0 (BURR) IMPLANT
BUR EGG ELITE 4.0MM (BURR)
BUR MATCHSTICK NEURO 3.0 LAGG (BURR) IMPLANT
CANISTER SUCTION 2500CC (MISCELLANEOUS) ×3 IMPLANT
CLOSURE STERI-STRIP 1/2X4 (GAUZE/BANDAGES/DRESSINGS) ×1
CLSR STERI-STRIP ANTIMIC 1/2X4 (GAUZE/BANDAGES/DRESSINGS) ×2 IMPLANT
CORDS BIPOLAR (ELECTRODE) ×3 IMPLANT
COVER SURGICAL LIGHT HANDLE (MISCELLANEOUS) ×3 IMPLANT
DRAIN CHANNEL 15F RND FF W/TCR (WOUND CARE) IMPLANT
DRAPE POUCH INSTRU U-SHP 10X18 (DRAPES) ×3 IMPLANT
DRAPE SURG 17X23 STRL (DRAPES) ×3 IMPLANT
DRAPE U-SHAPE 47X51 STRL (DRAPES) ×3 IMPLANT
DRSG MEPILEX BORDER 4X4 (GAUZE/BANDAGES/DRESSINGS) ×3 IMPLANT
DRSG MEPILEX BORDER 4X8 (GAUZE/BANDAGES/DRESSINGS) ×3 IMPLANT
DURAPREP 26ML APPLICATOR (WOUND CARE) ×3 IMPLANT
ELECT BLADE 4.0 EZ CLEAN MEGAD (MISCELLANEOUS)
ELECT CAUTERY BLADE 6.4 (BLADE) ×3 IMPLANT
ELECT PENCIL ROCKER SW 15FT (MISCELLANEOUS) ×3 IMPLANT
ELECT REM PT RETURN 9FT ADLT (ELECTROSURGICAL) ×3
ELECTRODE BLDE 4.0 EZ CLN MEGD (MISCELLANEOUS) IMPLANT
ELECTRODE REM PT RTRN 9FT ADLT (ELECTROSURGICAL) ×1 IMPLANT
EVACUATOR SILICONE 100CC (DRAIN) IMPLANT
GLOVE BIOGEL PI IND STRL 6.5 (GLOVE) ×2 IMPLANT
GLOVE BIOGEL PI IND STRL 8 (GLOVE) ×1 IMPLANT
GLOVE BIOGEL PI IND STRL 8.5 (GLOVE) ×1 IMPLANT
GLOVE BIOGEL PI INDICATOR 6.5 (GLOVE) ×4
GLOVE BIOGEL PI INDICATOR 8 (GLOVE) ×2
GLOVE BIOGEL PI INDICATOR 8.5 (GLOVE) ×2
GLOVE ORTHO TXT STRL SZ7.5 (GLOVE) ×3 IMPLANT
GLOVE SS BIOGEL STRL SZ 8.5 (GLOVE) ×1 IMPLANT
GLOVE SUPERSENSE BIOGEL SZ 8.5 (GLOVE) ×2
GLOVE SURG SS PI 6.5 STRL IVOR (GLOVE) ×6 IMPLANT
GOWN STRL REUS W/ TWL LRG LVL3 (GOWN DISPOSABLE) ×4 IMPLANT
GOWN STRL REUS W/TWL 2XL LVL3 (GOWN DISPOSABLE) ×6 IMPLANT
GOWN STRL REUS W/TWL LRG LVL3 (GOWN DISPOSABLE) ×8
KIT BASIN OR (CUSTOM PROCEDURE TRAY) ×3 IMPLANT
KIT ROOM TURNOVER OR (KITS) ×3 IMPLANT
NEEDLE 22X1 1/2 (OR ONLY) (NEEDLE) ×3 IMPLANT
NEEDLE SPNL 18GX3.5 QUINCKE PK (NEEDLE) ×6 IMPLANT
NS IRRIG 1000ML POUR BTL (IV SOLUTION) ×3 IMPLANT
PACK LAMINECTOMY ORTHO (CUSTOM PROCEDURE TRAY) ×3 IMPLANT
PACK UNIVERSAL I (CUSTOM PROCEDURE TRAY) ×3 IMPLANT
PAD ARMBOARD 7.5X6 YLW CONV (MISCELLANEOUS) ×6 IMPLANT
PATTIES SURGICAL .5 X.5 (GAUZE/BANDAGES/DRESSINGS) IMPLANT
PATTIES SURGICAL .5 X1 (DISPOSABLE) ×9 IMPLANT
SPONGE LAP 4X18 X RAY DECT (DISPOSABLE) ×3 IMPLANT
SPONGE SURGIFOAM ABS GEL 100 (HEMOSTASIS) ×3 IMPLANT
SURGIFLO W/THROMBIN 8M KIT (HEMOSTASIS) ×3 IMPLANT
SUT BONE WAX W31G (SUTURE) ×3 IMPLANT
SUT MON AB 3-0 SH 27 (SUTURE) ×2
SUT MON AB 3-0 SH27 (SUTURE) ×1 IMPLANT
SUT VIC AB 0 CT1 27 (SUTURE) ×2
SUT VIC AB 0 CT1 27XBRD ANBCTR (SUTURE) ×1 IMPLANT
SUT VIC AB 1 CT1 18XCR BRD 8 (SUTURE) ×1 IMPLANT
SUT VIC AB 1 CT1 8-18 (SUTURE) ×2
SUT VIC AB 1 CTX 36 (SUTURE) ×4
SUT VIC AB 1 CTX36XBRD ANBCTR (SUTURE) ×2 IMPLANT
SUT VIC AB 2-0 CT1 18 (SUTURE) ×3 IMPLANT
SYR BULB IRRIGATION 50ML (SYRINGE) ×3 IMPLANT
SYR CONTROL 10ML LL (SYRINGE) ×3 IMPLANT
TOWEL OR 17X24 6PK STRL BLUE (TOWEL DISPOSABLE) ×3 IMPLANT
TOWEL OR 17X26 10 PK STRL BLUE (TOWEL DISPOSABLE) ×3 IMPLANT
WATER STERILE IRR 1000ML POUR (IV SOLUTION) IMPLANT
YANKAUER SUCT BULB TIP NO VENT (SUCTIONS) ×3 IMPLANT

## 2015-04-21 NOTE — Anesthesia Postprocedure Evaluation (Signed)
  Anesthesia Post-op Note  Patient: Courtney [name]  Procedure(s) Performed: Procedure(s): DECOMPRESSION LUMBAR [place] FOUR  Patient Location: PACU  Anesthesia Type: General  Level of Consciousness: awake and alert   Airway and Oxygen Therapy: Patient Spontanous Breathing  Post-op Pain: mild  Post-op Assessment: Post-op Vital signs reviewed, Patient's Cardiovascular Status Stable, Respiratory Function Stable, Patent Airway and No signs of Nausea or vomiting  Last Vitals:  Filed Vitals:   04/21/15 1400  BP: 108/49  Pulse: 55  Temp:   Resp: 0    Post-op Vital Signs: stable   Complications: No apparent anesthesia complications

## 2015-04-21 NOTE — Brief Op Note (Signed)
04/21/2015  12:58 PM  PATIENT:  Courtney [name]  64 y.o. female  PRE-OPERATIVE DIAGNOSIS:  SPINAL STENOSIS L3-4  POST-OPERATIVE DIAGNOSIS:  SPINAL STENOSIS L3-4  PROCEDURE:  Procedure(s): DECOMPRESSION LUMBAR THREE-LUMBAR FOUR  SURGEON:  Surgeon(s) and Role:    * Melina Schools, MD - Primary  PHYSICIAN ASSISTANT:   ASSISTANTS: Carmen Mayo   ANESTHESIA:   general  EBL:  Total I/O In: -  Out: 50 [Blood:50]  BLOOD ADMINISTERED:none  DRAINS: none   LOCAL MEDICATIONS USED:  MARCAINE     SPECIMEN:  No Specimen  DISPOSITION OF SPECIMEN:  N/A  COUNTS:  YES  TOURNIQUET:  * No tourniquets in log *  DICTATION: .Other Dictation: Dictation Number [id number]  PLAN OF CARE: Admit for overnight observation  PATIENT DISPOSITION:  PACU - hemodynamically stable.

## 2015-04-21 NOTE — Anesthesia Preprocedure Evaluation (Addendum)
Anesthesia Evaluation  Patient identified by MRN, date of birth, ID band Patient awake    Reviewed: Allergy & Precautions, NPO status , Patient's Chart, lab work & pertinent test results  Airway Mallampati: II  TM Distance: >3 FB Neck ROM: Full    Dental  (+) Teeth Intact, Dental Advisory Given   Pulmonary neg pulmonary ROS,    Pulmonary exam normal breath sounds clear to auscultation       Cardiovascular Exercise Tolerance: Good (-) hypertension(-) angina(-) Past MI Normal cardiovascular exam Rhythm:Regular Rate:Normal  Echo 02/10/15: - Left ventricle: The cavity size was normal. Wall thickness was normal. Systolic function was normal. The estimated ejection fraction was in the range of 55% to 60%. Wall motion was normal; there were no regional wall motion abnormalities. - Aortic valve: Bicuspid; mildly thickened leaflets. Transvalvular velocity was within the normal range. There was no stenosis. There was no significant regurgitation.     Neuro/Psych PSYCHIATRIC DISORDERS Depression  Neuromuscular disease    GI/Hepatic Neg liver ROS, GERD  Medicated and Controlled,  Endo/Other  negative endocrine ROS  Renal/GU negative Renal ROS     Musculoskeletal  (+) Arthritis , Fibromyalgia -  Abdominal   Peds  Hematology negative hematology ROS (+)   Anesthesia Other Findings Day of surgery medications reviewed with the patient.  Reproductive/Obstetrics                           Anesthesia Physical Anesthesia Plan  ASA: II  Anesthesia Plan: General   Post-op Pain Management:    Induction: Intravenous  Airway Management Planned: Oral ETT  Additional Equipment:   Intra-op Plan:   Post-operative Plan: Extubation in OR  Informed Consent: I have reviewed the patients History and Physical, chart, labs and discussed the procedure including the risks, benefits and alternatives for the proposed  anesthesia with the patient or authorized representative who has indicated his/her understanding and acceptance.   Dental advisory given  Plan Discussed with: CRNA  Anesthesia Plan Comments: (Risks/benefits of general anesthesia discussed with patient including risk of damage to teeth, lips, gum, and tongue, nausea/vomiting, allergic reactions to medications, and the possibility of heart attack, stroke and death.  All patient questions answered.  Patient wishes to proceed.)        Anesthesia Quick Evaluation

## 2015-04-21 NOTE — Anesthesia Procedure Notes (Signed)
Procedure Name: Intubation Date/Time: 04/21/2015 11:16 AM Performed by: Collier Bullock Pre-anesthesia Checklist: Patient identified, Emergency Drugs available, Suction available, Patient being monitored and Timeout performed Patient Re-evaluated:Patient Re-evaluated prior to inductionOxygen Delivery Method: Circle system utilized Preoxygenation: Pre-oxygenation with 100% oxygen Intubation Type: IV induction Ventilation: Mask ventilation without difficulty Laryngoscope Size: Mac and 3 Grade View: Grade II Tube type: Oral Tube size: 7.0 mm Number of attempts: 1 Placement Confirmation: ETT inserted through vocal cords under direct vision,  positive ETCO2 and breath sounds checked- equal and bilateral Secured at: 24 cm Tube secured with: Tape Dental Injury: Teeth and Oropharynx as per pre-operative assessment

## 2015-04-21 NOTE — Transfer of Care (Signed)
Immediate Anesthesia Transfer of Care Note  Patient: Courtney [name]  Procedure(s) Performed: Procedure(s): DECOMPRESSION LUMBAR [place] FOUR  Patient Location: PACU  Anesthesia Type:General  Level of Consciousness: awake, alert , oriented and patient cooperative  Airway & Oxygen Therapy: Patient Spontanous Breathing and Patient connected to nasal cannula oxygen  Post-op Assessment: Report given to RN, Post -op Vital signs reviewed and stable, Patient moving all extremities X 4 and Patient able to stick tongue midline  Post vital signs: Reviewed and stable  Last Vitals:  Filed Vitals:   04/21/15 1328  BP: 121/65  Pulse: 68  Temp: 36.7 C  Resp: 14    Complications: No apparent anesthesia complications

## 2015-04-22 ENCOUNTER — Encounter (HOSPITAL_COMMUNITY): Payer: Self-pay | Admitting: Orthopedic Surgery

## 2015-04-22 DIAGNOSIS — M4806 Spinal stenosis, lumbar region: Secondary | ICD-10-CM | POA: Diagnosis not present

## 2015-04-22 MED ORDER — MAGNESIUM CITRATE PO SOLN
0.5000 | Freq: Once | ORAL | Status: AC
  Administered 2015-04-22: 0.5 via ORAL
  Filled 2015-04-22: qty 296

## 2015-04-22 MED ORDER — OXYCODONE HCL 10 MG PO TABS: 10.0000 mg | ORAL_TABLET | ORAL | Status: DC | PRN

## 2015-04-22 NOTE — Evaluation (Signed)
Physical Therapy Evaluation Patient Details Name: Courtney [name] MRN: [id number] DOB: 1951/01/11 Today's Date: 04/22/2015   History of Present Illness  64yo F s/p Lumbar laminectomy, L3-4 due to spinal stenosis on 04/21/15  Clinical Impression  Pt moving well and without deficits.  Discussed continued ambulation at home and moderation of activities.  No further acute PT needs at this time, will sign off.      Follow Up Recommendations No PT follow up;Supervision - Intermittent    Equipment Recommendations  None recommended by PT    Recommendations for Other Services       Precautions / Restrictions Precautions Precautions: Back Precaution Booklet Issued: No Precaution Comments: educated on all back precautions and pt has handout from [place].   Required Braces or Orthoses: Spinal Brace Spinal Brace: Applied in sitting position Restrictions Weight Bearing Restrictions: No      Mobility  Bed Mobility Overal bed mobility: Modified Independent             General bed mobility comments: pt demonstrates good log roll technique.    Transfers Overall transfer level: Modified independent Equipment used: None             General transfer comment: Use of UEs needed, but demonstrates good technique.    Ambulation/Gait Ambulation/Gait assistance: Modified independent (Device/Increase time) Ambulation Distance (Feet): 160 Feet Assistive device: None Gait Pattern/deviations: Step-through pattern;Decreased stride length     General Gait Details: pt moves slowly and with guarded posture, but no deficits or LOB noted.    Stairs Stairs: Yes Stairs assistance: Supervision Stair Management: One rail Left;Alternating pattern;Forwards Number of Stairs: 10 General stair comments: cues for safe technique with good return demonstration.    Wheelchair Mobility    Modified Rankin (Stroke Patients Only)       Balance Overall balance assessment: No apparent balance  deficits (not formally assessed)                                           Pertinent Vitals/Pain Pain Assessment: 0-10 Pain Score: 5  Pain Location: Back Pain Descriptors / Indicators: Aching;Guarding Pain Intervention(s): Monitored during session;Premedicated before session;Repositioned    Home Living Family/patient expects to be discharged to:: Private residence Living Arrangements: Alone Available Help at Discharge: Friend(s);Family;Available 24 hours/day (through Wednesday of next week) Type of Home: House Home Access: Stairs to enter Entrance Stairs-Rails: Right Entrance Stairs-Number of Steps: 3 Home Layout: One level;Laundry or work area in [place]: Kasandra Knudsen - single point;Bedside commode;Shower seat;Tub bench;Other (comment) (suction grab bar, long shoe horn, reacher)      Prior Function Level of Independence: Independent         Comments: works Pension scheme manager   Dominant Hand: Right    Extremity/Trunk Assessment   Upper Extremity Assessment: Defer to OT evaluation           Lower Extremity Assessment: Overall WFL for tasks assessed      Cervical / Trunk Assessment: Other exceptions  Communication   Communication: No difficulties  Cognition Arousal/Alertness: Awake/alert Behavior During Therapy: WFL for tasks assessed/performed Overall Cognitive Status: Within Functional Limits for tasks assessed                      General Comments      Exercises  Assessment/Plan    PT Assessment Patent does not need any further PT services  PT Diagnosis Difficulty walking   PT Problem List    PT Treatment Interventions     PT Goals (Current goals can be found in the Care Plan section) Acute Rehab PT Goals Patient Stated Goal: to be able to travel to [place] for Christmas PT Goal Formulation: All assessment and education complete, DC therapy    Frequency     Barriers  to discharge        Co-evaluation               End of Session Equipment Utilized During Treatment: Back brace Activity Tolerance: Patient tolerated treatment well Patient left: in bed;with call bell/phone within reach Nurse Communication: Mobility status    Functional Assessment Tool Used: Clinical Judgement Functional Limitation: Mobility: Walking and moving around Mobility: Walking and Moving Around Current Status [phone]): 0 percent impaired, limited or restricted Mobility: Walking and Moving Around Goal Status [phone]): 0 percent impaired, limited or restricted Mobility: Walking and Moving Around Discharge Status [phone]): 0 percent impaired, limited or restricted    Time: [id number] PT Time Calculation (min) (ACUTE ONLY): 17 min   Charges:   PT Evaluation $Initial PT Evaluation Tier I: 1 Procedure     PT G Codes:   PT G-Codes **NOT FOR INPATIENT CLASS** Functional Assessment Tool Used: Clinical Judgement Functional Limitation: Mobility: Walking and moving around Mobility: Walking and Moving Around Current Status [id number]): 0 percent impaired, limited or restricted Mobility: Walking and Moving Around Goal Status [id number]): 0 percent impaired, limited or restricted Mobility: Walking and Moving Around Discharge Status [id number]): 0 percent impaired, limited or restricted    Catarina Hartshorn, [place] 04/22/2015, 11:23 AM

## 2015-04-22 NOTE — Progress Notes (Signed)
Occupational Therapy Evaluation Patient Details Name: Courtney [name] MRN: [id number] DOB: 01-09-1951 Today's Date: 04/22/2015    History of Present Illness 64yo F s/p Lumbar laminectomy, L3-4 due to spinal stenosis on 04/21/15   Clinical Impression   Patient educated on all back precautions and on how to manage ADLs while maintaining back precautions. All education complete with good verbal and demonstrative understanding. No further OT needs at this time. OT will sign off.    Follow Up Recommendations  No OT follow up;Supervision - Intermittent    Equipment Recommendations  None recommended by OT    Recommendations for Other Services PT consult     Precautions / Restrictions Precautions Precautions: Back Precaution Comments: educated on all back precautions Required Braces or Orthoses: Spinal Brace Spinal Brace: Applied in sitting position      Mobility Bed Mobility               General bed mobility comments: NT -- sitting EOB  Transfers Overall transfer level: Modified independent Equipment used: None                  Balance                                            ADL Overall ADL's : Modified independent                                       General ADL Comments: Educated patient on managing ADLs with back precautions. She has a long sponge, shoe horn, reacher at home. Discussed how to use during LB self-care to adhere to back precautions. PAtient verbalized understanding. She was able to toilet, get dressed, don/doff brace with modified independence during session. She will have family and friends through Wednesday of next week to assist if needed. Discussed not showering until d/c instructions indicate and to have someone present for safety. Patient educated on sock aide and knows it is available but is able to cross on foot over opposite knee to don socks at this time. No further OT needs as all education  completed.     Vision     Perception     Praxis      Pertinent Vitals/Pain Pain Assessment: 0-10 Pain Score: 5  Pain Location: back Pain Descriptors / Indicators: Aching;Operative site guarding Pain Intervention(s): Limited activity within patient's tolerance;Monitored during session;Repositioned     Hand Dominance Right   Extremity/Trunk Assessment Upper Extremity Assessment Upper Extremity Assessment: Overall WFL for tasks assessed   Lower Extremity Assessment Lower Extremity Assessment: Overall WFL for tasks assessed   Cervical / Trunk Assessment Cervical / Trunk Assessment: Other exceptions Cervical / Trunk Exceptions: brace   Communication Communication Communication: No difficulties   Cognition Arousal/Alertness: Awake/alert Behavior During Therapy: WFL for tasks assessed/performed Overall Cognitive Status: Within Functional Limits for tasks assessed                     General Comments       Exercises       Shoulder Instructions      Home Living Family/patient expects to be discharged to:: Private residence Living Arrangements: Alone Available Help at Discharge: Friend(s);Family;Available 24 hours/day (through Wednesday of next week) Type of Home: House Home Access: Stairs to  enter Entrance Stairs-Number of Steps: 3 Entrance Stairs-Rails: Right Home Layout: One level;Laundry or work area in basement     [employer]: Occupational psychologist: Handicapped height Bathroom Accessibility: Yes How Accessible: Accessible via [place]: Kasandra Knudsen - single point;Bedside commode;Shower seat;Tub bench;Other (comment) (suction grab bar, long shoe horn, reacher)          Prior Functioning/Environment Level of Independence: Independent        Comments: works Financial trader    OT Diagnosis: Acute pain   OT Problem List: Decreased knowledge of precautions;Pain   OT Treatment/Interventions:      OT  Goals(Current goals can be found in the care plan section) Acute Rehab OT Goals Patient Stated Goal: to be able to travel to [place] for Christmas OT Goal Formulation: All assessment and education complete, DC therapy  OT Frequency:     Barriers to D/C:            Co-evaluation              End of Session Equipment Utilized During Treatment: Back brace Nurse Communication: Mobility status  Activity Tolerance: Patient tolerated treatment well;No increased pain Patient left: in bed;with call bell/phone within reach   Time: [id number] OT Time Calculation (min): 31 min Charges:  OT General Charges $OT Visit: 1 Procedure OT Evaluation $Initial OT Evaluation Tier I: 1 Procedure OT Treatments $Self Care/Home Management : 8-22 mins G-Codes: OT G-codes **NOT FOR INPATIENT CLASS** Functional Assessment Tool Used: clinical reasoning Functional Limitation: Self care Self Care Current Status [id number]): At least 1 percent but less than 20 percent impaired, limited or restricted Self Care Goal Status [id number]): At least 1 percent but less than 20 percent impaired, limited or restricted Self Care Discharge Status [phone]): At least 1 percent but less than 20 percent impaired, limited or restricted  Smantha Boakye A 04/22/2015, 11:12 AM

## 2015-04-22 NOTE — Op Note (Signed)
Courtney [name], Courtney [name] NO.:  [id number]  MEDICAL RECORD NO.:  [id number]  LOCATION:  3C06C                        FACILITY:  [facility]  PHYSICIAN:  Sennie Borden D. Rolena Infante, M.D. DATE OF BIRTH:  22-Aug-1950  DATE OF PROCEDURE:  04/21/2015 DATE OF DISCHARGE:                              OPERATIVE REPORT   PREOPERATIVE DIAGNOSIS:  Lumbar spinal stenosis, L3-4.  POSTOPERATIVE DIAGNOSIS:  Lumbar spinal stenosis, L3-4.  OPERATIVE PROCEDURE:  Lumbar laminectomy, L3-4, open.  FIRST ASSISTANT:  [facility].  COMPLICATIONS:  None.  CONDITION:  Stable.  HISTORY:  This is a very pleasant 65 year old woman who has been complaining of significant back pain with neurogenic claudication, right leg worse than the left.  Attempts at conservative management have failed to alleviate her symptoms.  After discussing treatment options, I felt as though her principal problem was her neuropathic leg pain secondary to her spinal stenosis.  We discussed treatment options and elected to proceed with the decompression.  All appropriate risks, benefits, and alternatives were discussed with the patient and consent was obtained.  OPERATIVE NOTE:  The patient was brought to the operating room, placed supine on the operating table.  After successful induction of general anesthesia and endotracheal intubation, TEDs and SCDs were applied and she was turned prone onto the Wilson frame.  All bony prominences were well padded and the back was prepped and draped in a standard fashion. Time-out was taken to confirm patient, procedure, and all other pertinent important data.  Once this was done, 2 needles were placed into the back and an x-ray was taken for localization of incision.  The proposed incision site was infiltrated with 0.25% Marcaine.  A midline incision was made from the superior part of the L3 spinous process to the inferior portion of the L4 spinous process.  Sharp dissection was carried out  down to the deep fascia.  Using Cobb and Bovie, I stripped the paraspinal muscles to expose the L3-L4 spinous process and lamina and facet complex.  Self-retaining retractor was placed and a Penfield 4 was placed underneath the L3 lamina.  Intraoperative x-ray was taken to confirm we were at the appropriate level.  Once this was done, a double- action Leksell rongeur was used to remove almost the entire L3 spinous process and the superior portion of that of L4.  I then used my 3-0 Karlin curette to develop a plane underneath the lamina and used a 3 mm Kerrison punch to perform a generous laminotomy of L3.  I then used my Penfield 4 to create a plane between the ligamentum flavum and the thecal sac starting through the central raphe.  I exploited this plane with my 2 and 3 mm Kerrison punch to perform a central decompression.  I placed the neural patty along the dura superiorly and completed my L3 laminotomy.  I made sure I was decompressed just to the level of the L3 pedicle.  I then proceeded into the lateral recess.  I could palpate the L3 pedicle.  I continued decompressing with my Kerrison punches removing ligamentum flavum and bone spur into the lateral recess.  There were very large epidural veins, which were coagulated with bipolar electrocautery.  I identified the L3-4 disk space and confirmed reviewing my imaging, preoperative MRI an adequate decompression at the level of concern for stenosis.  I proceeded into the lateral recess down to the L4 pedicle and into the L4 foramen.  Again foraminotomies were performed with 3 and 4 as was the lateral recess decompression.  Once I had completed my decompression on the left, I went to the contralateral side of the table and began working on the right side.  Using the same technique, I completed my generous L3 laminotomy on this side and my central decompression as well as lateral recess decompression.  Again, large epidural veins were  identified and coagulated with bipolar electrocautery.  Disk space was identified, the L4 and L3 nerve roots were identified, and appropriate foraminotomies were performed.  At this point, I could freely take my [facility] and travel superiorly and inferiorly in the lateral recess ventral to the L4 nerve root out into the foramen similar in the L3.  Once I confirmed I had an excellent bilateral central and lateral recess and foraminal decompression, I irrigated copiously with normal saline.  I obtained hemostasis using bipolar electrocautery, Floseal, and thrombin-soaked Gelfoam patty. Once I had confirmed adequate decompression and hemostasis, a large Gel- Foam patty was placed over the exposed thecal sac.  I then closed the deep fascia with interrupted #1 Vicryl sutures, superficial with 2-0 Vicryl sutures, and a 3-0 Monocryl for the skin.  Steri-Strips and dry dressing were applied.  The patient was ultimately extubated, transferred to PACU without incident.     Stella Encarnacion D. Rolena Infante, M.D.     DDB/MEDQ  D:  04/21/2015  T:  04/22/2015  Job:  [id number]  cc:   Duane Lope D. Rolena Infante, M.D.

## 2015-04-22 NOTE — Progress Notes (Addendum)
Patient ID: Courtney [name], female   DOB: Aug 24, 1950, 64 y.o.   MRN: [id number]    Subjective: 1 Day Post-Op Procedure(s): DECOMPRESSION LUMBAR THREE-LUMBAR FOUR Patient reports pain as 3 on 0-10 scale.   Denies CP or SOB.  Voiding without difficulty. Positive flatus. Objective: Vital signs in last 24 hours: Temp:  [97.7 F (36.5 C)-98.4 F (36.9 C)] 98.4 F (36.9 C) (11/10 0400) Pulse Rate:  [55-91] 61 (11/10 0400) Resp:  [12-22] 20 (11/10 0400) BP: (90-133)/(41-66) 124/66 mmHg (11/09 2344) SpO2:  [93 %-100 %] 95 % (11/10 0400) Weight:  [68.04 kg (150 lb)] 68.04 kg (150 lb) (11/09 0926)  Intake/Output from previous day: 11/09 0701 - 11/10 0700 In: 2040 [P.O.:240; I.V.:1800] Out: 50 [Blood:50] Intake/Output this shift:    Labs: No results for input(s): HGB in the last 72 hours. No results for input(s): WBC, RBC, HCT, PLT in the last 72 hours. No results for input(s): NA, K, CL, CO2, BUN, CREATININE, GLUCOSE, CALCIUM in the last 72 hours. No results for input(s): LABPT, INR in the last 72 hours.  Physical Exam: Neurologically intact ABD soft Neurovascular intact Sensation intact distally Intact pulses distally Dorsiflexion/Plantar flexion intact Incision: no drainage Compartment soft  Assessment/Plan: 1 Day Post-Op Procedure(s): DECOMPRESSION LUMBAR THREE-LUMBAR FOUR Advance diet Up with therapy  Consider DC home tomorrow Ordered Mag citrate  [place], Darla Lesches for Dr. Melina Schools [facility] [phone] 04/22/2015, 7:47 AM  Doing well Ambulating in hall No radicular leg pain Plan on d/c to home today F/u in 2 weeks

## 2015-04-22 NOTE — Progress Notes (Signed)
Pt doing well. Pt and daughter given D/C instructions with Rx's, verbal understanding was provided. Pt's incision is clean and dry with no sign of infection. Pt's IV was removed prior to D/C. Pt D/C'd home via wheelchair @1210  per MD order. Pt is stable @ D/C and has no other needs at this time. Holli Humbles, RN

## 2015-04-23 NOTE — Discharge Summary (Signed)
Physician Discharge Summary  Patient ID: Courtney [name] MRN: [id number] DOB/AGE: 09-14-1950 64 y.o.  Admit date: 04/21/2015 Discharge date: 04/23/2015  Admission Diagnoses:  Lumbar Spinal Stenosis L3-4  Discharge Diagnoses:  Active Problems:   Spinal stenosis   Past Medical History  Diagnosis Date  . Arthritis   . Depression   . Hyperlipidemia   . Fibromyalgia     Surgeries: Procedure(s): DECOMPRESSION LUMBAR THREE-LUMBAR FOUR on 04/21/2015   Consultants (if any):    Discharged Condition: Improved  Hospital Course: Courtney [name] is an 64 y.o. female who was admitted 04/21/2015 with a diagnosis of Lumbar Spinal Stenosis L3-4 and went to the operating room on 04/21/2015 and underwent the above named procedures.  The pt was discharged on 04/22/15.  Hospital course was uneventful.   She was given perioperative antibiotics:  Anti-infectives    Start     Dose/Rate Route Frequency Ordered Stop   04/21/15 1800  ceFAZolin (ANCEF) IVPB 1 g/50 mL premix     1 g 100 mL/hr over 30 Minutes Intravenous Every 8 hours 04/21/15 1548 04/22/15 0230   04/21/15 1000  ceFAZolin (ANCEF) IVPB 2 g/50 mL premix     2 g 100 mL/hr over 30 Minutes Intravenous 30 min pre-op 04/20/15 1327 04/21/15 1130    .  She was given sequential compression devices, early ambulation, and TED for DVT prophylaxis.  She benefited maximally from the hospital stay and there were no complications.    Recent vital signs:  Filed Vitals:   04/22/15 0830  BP: 104/50  Pulse: 64  Temp: 98.2 F (36.8 C)  Resp: 18    Recent laboratory studies:  Lab Results  Component Value Date   HGB 12.5 04/16/2015   Lab Results  Component Value Date   WBC 5.4 04/16/2015   PLT 191 04/16/2015   No results found for: INR Lab Results  Component Value Date   NA 139 04/16/2015   K 4.2 04/16/2015   CL 106 04/16/2015   CO2 26 04/16/2015   BUN 15 04/16/2015   CREATININE 0.78 04/16/2015   GLUCOSE 117* 04/16/2015     Discharge Medications:     Medication List    STOP taking these medications        atovaquone-proguanil 250-100 MG Tabs tablet  Commonly known as:  MALARONE     gabapentin 400 MG capsule  Commonly known as:  NEURONTIN     lidocaine 5 %  Commonly known as:  LIDODERM     Naproxen Sodium 220 MG Caps     traZODone 100 MG tablet  Commonly known as:  DESYREL      TAKE these medications        aspirin 81 MG EC tablet  Take 81 mg by mouth daily.     atorvastatin 40 MG tablet  Commonly known as:  LIPITOR  Take 1 tablet (40 mg total) by mouth daily.     b complex vitamins capsule  Take 1 capsule by mouth daily.     DULoxetine 20 MG capsule  Commonly known as:  CYMBALTA  Take 40 mg by mouth every morning.     fluticasone 50 MCG/ACT nasal spray  Commonly known as:  FLONASE  Place 2 sprays into both nostrils at bedtime.     MAGNESIUM ASPARTATE HCL PO  Take 1 tablet by mouth daily.     methocarbamol 500 MG tablet  Commonly known as:  ROBAXIN  Take 1 tablet (500 mg total) by mouth 3 (  three) times daily as needed for muscle spasms.     multivitamin with minerals tablet  Take 1 tablet by mouth every other day.     OMEGA 3 PO  Take 1 capsule by mouth daily.     omeprazole 20 MG capsule  Commonly known as:  PRILOSEC  Take 20 mg by mouth daily.     ondansetron 4 MG tablet  Commonly known as:  ZOFRAN  Take 1 tablet (4 mg total) by mouth every 8 (eight) hours as needed for nausea or vomiting.     Oxycodone HCl 10 MG Tabs  Take 1 tablet (10 mg total) by mouth every 4 (four) hours as needed for severe pain.     oxyCODONE-acetaminophen 10-325 MG tablet  Commonly known as:  PERCOCET  Take 1 tablet by mouth every 4 (four) hours as needed for pain.     polycarbophil 625 MG tablet  Commonly known as:  FIBERCON  Take 625 mg by mouth daily.     pregabalin 100 MG capsule  Commonly known as:  LYRICA  Take 1 capsule (100 mg total) by mouth 5 (five) times daily.      PREMARIN vaginal cream  Generic drug:  conjugated estrogens  Place 1 Applicatorful vaginally at bedtime. Use as directed     urea 40 % Crea  Commonly known as:  CARMOL  Apply 1 application topically daily.     VITAMIN C PO  Take 1,000 mg by mouth daily.     VITAMIN D-1000 MAX ST 1000 UNITS tablet  Generic drug:  Cholecalciferol  Take 1,000 Units by mouth daily.     vitamin E 400 UNIT capsule  Take 1 capsule by mouth every other day.        Diagnostic Studies: Dg Lumbar Spine 1 View  2015-04-25  CLINICAL DATA:  Lumbar spine surgery. EXAM: LUMBAR SPINE - 1 VIEW COMPARISON:  None. FINDINGS: Lumbar vertebra are numbered with the lowest segmented or partially segmented vertebra as L5. This may be a transitional vertebra. Metallic markers are noted along the posterior aspect of the L2-L3 and L3-L4 disc space levels. Diffuse degenerative change lumbar spine. IMPRESSION: Postsurgical changes lumbar spine as above. Electronically Signed   By: Marcello Moores  Register   On: 04/25/15 16:12   Dg Spine Portable 1 View  Apr 25, 2015  CLINICAL DATA:  L3-4 decompression EXAM: PORTABLE SPINE - 1 VIEW COMPARISON:  None. FINDINGS: Lateral radiograph was obtained of the lumbar spine intraoperatively for localization. Surgical instrument retractors noted posterior to the L3-4 interspace. The levels are numbered utilizing a standard nomenclature although no prior films are available. IMPRESSION: Intraoperative localization at L3-4 These results were called by telephone at the time of interpretation on 04/25/15 at 12:04 pm to the operating room, who verbally acknowledged these results. Electronically Signed   By: Inez Catalina M.D.   On: April 25, 2015 12:06    Disposition: 01-Home or Self Care        Follow-up Information    Follow up with Dahlia Bailiff, MD. Schedule an appointment as soon as possible for a visit in 2 weeks.   Specialty:  Orthopedic Surgery   Why:  If symptoms worsen, For suture removal, For  wound re-check   Contact information:   [address] [place] [address] [id number]        Signed: Valinda Hoar 04/23/2015, 1:25 PM

## 2015-04-26 NOTE — H&P (Signed)
History of Present Illness The patient is a 64 year old female who comes in today for a preoperative History and Physical. The patient is scheduled for a Lumbar Decompression L3-4 to be performed by Dr. Duane Lope D. Rolena Infante, MD at [facility] on 04/21/15 . Please see the hospital record for complete dictated history and physical.  Additional reasons for visit:  Follow-up back is described as the following: The patient is being followed for their back pain (and is in to discuss surgery). Symptoms reported today include: pain, aching, pain with weightbearing (any type of movement), numbness (bilat L>R), burning, leg pain (bilat R>L) and foot pain. The patient states that they are doing poorly. Current treatment includes: activity modification. The following medication has been used for pain control: Trazadone and Lyrica. The patient reports their current pain level to be 0 / 10 (with Lyrica and no movement). Note for "Follow-up back": Patient states she has had RFA of C2,3,4,5 12-29-14 @ [facility]. the pt believes she is very sensitiveto anesthisia. she says at times she has been "foggy" for 2-3 weeks. She continues to have severe LBP and radiular pain b/l R>L.  Allergies  Nuts tree nuts and ground nuts (except almonds) Fish Meal *CHEMICALS* fish that swim  Family History  Cancer mother Heart Disease mother Osteoarthritis mother Rheumatoid Arthritis father Severe allergy mother and father  Social History Tobacco use former smoker Tobacco / smoke exposure no Living situation live with spouse Illicit drug use no Exercise Exercises rarely; does running / walking Pain Contract no Number of flights of stairs before winded 2-3 Marital status married Drug/Alcohol Rehab (Previously) no Alcohol use current drinker; drinks wine; only occasionally per week Drug/Alcohol Rehab (Currently) no Current work status working full time Children 2  Medication History  Fish  Oil (1000MG  Capsule, Oral) Active. (QD) Vitamin C (500MG  Capsule, Oral) Active. (QD) Vitamin D-1000 Max St (1000UNIT Tablet, Oral) Active. (QD) Vitamin E (1000UNIT Capsule, Oral) Active. (QOD) Calcium 600 (1500 (600 Ca)MG Tablet, Oral) Active. (QD) Omeprazole (20MG  Tablet DR, Oral) Active. DULoxetine HCl (30MG  Capsule DR Part, Oral) Active. Cipro (250MG  Tablet, Oral as needed) Active. Epi E-Z Pen (1:1000 Device, Injection as needed) Active. Malarone (62.5-25MG  Tablet, Oral as needed) Active. Aspirin (81MG  Tablet, 1 (one) Oral) Active. Tylenol (325MG  Tablet, 1 (one) Oral) Active. Multiple Vitamin (1 (one) Oral) Active. Lyrica (100MG  Capsule, Oral) Active. (5 X DAY RX'D BY PAIN MANAGEMENT) Dexedrine (5MG  Tablet, Oral) Active. (PRN) Flonase Allergy Relief (50MCG/ACT Suspension, Nasal) Active. (QD) TraZODone HCl (50MG  Tablet, Oral) Active. (QHS) Pravastatin Sodium (80MG  Tablet, Oral) Active. (QD) Naproxen Sodium (220MG  Capsule, Oral) Active. (BID) Medications Reconciled  Vitals  04/16/2015 1:24 PM Weight: 156 lb Height: 63.5in Body Surface Area: 1.75 m Body Mass Index: 27.2 kg/m  Temp.: 98.61F  Pulse: 74 (Regular)  BP: 145/63 (Sitting, Left Arm, Standard)  General General Appearance-Not in acute distress. Orientation-Oriented X3. Build & Nutrition-Well nourished and Well developed.  Integumentary General Characteristics Surgical Scars - no surgical scar evidence of previous lumbar surgery. Lumbar Spine-Skin examination of the lumbar spine is without deformity, skin lesions, lacerations or abrasions.  Abdomen Palpation/Percussion Palpation and Percussion of the abdomen reveal - Soft, Non Tender and No Rebound tenderness.  Peripheral Vascular Lower Extremity Palpation - Posterior tibial pulse - Bilateral - 2+. Dorsalis pedis pulse - Bilateral - 2+.  Neurologic Sensation Lower Extremity - Bilateral - sensation is intact in the lower  extremity. Reflexes Patellar Reflex - Bilateral - 2+. Achilles Reflex - Bilateral -  2+. Clonus - Bilateral - clonus not present. Hoffman's Sign - Bilateral - Hoffman's sign not present. Testing Seated Straight Leg Raise - Right - Seated straight leg raise positive.  Musculoskeletal Spine/Ribs/Pelvis  Lumbosacral Spine: Inspection and Palpation - Tenderness - left lumbar paraspinals tender to palpation and right lumbar paraspinals tender to palpation, bony and soft tissue palpation of the lumbar spine and SI joint does not recreate their typical pain. Strength and Tone: Strength - Hip Flexion - Bilateral - 5/5. Knee Extension - Bilateral - 5/5. Knee Flexion - Bilateral - 5/5. Ankle Dorsiflexion - Bilateral - 5/5. Ankle Plantarflexion - Bilateral - 5/5. Heel walk - Bilateral - unable to heel walk. Toe Walk - Bilateral - unable to walk on toes. Heel-Toe Walk - Bilateral - able to heel-toe walk with mild difficulty. ROM - Flexion - mildly decreased range of motion and painful. Extension - mildly decreased range of motion and painful. Left Lateral Bending - mildly decreased range of motion and painful. Right Lateral Bending - mildly decreased range of motion and painful. Pain - neither flexion or extension is more painful than the other. Lumbosacral Spine - Waddell's Signs - no Waddell's signs present. Lower Extremity Range of Motion - No true hip, knee or ankle pain with range of motion. Gait and Station - [employer] - cane.  Goal Of Surgery:Discussed that goal of surgery is to reduce pain and improve function and quality of life. Patient is aware that despite all appropriate treatment that there pain and function could be the same, worse, or different. Posterior Lumbar Decompression/disectomy: Risks of surgery include infection, bleeding, nerve damage, death, stroke, paralysis, failure to heal, need for further surgery, ongoing or worse pain, need for further surgery, CSF leak, loss of bowel or  bladder, and recurrent disc herniation or Stenosis which would necessitate need for further surgery.  Plans Transcription At this point in time, we have again gone over her imaging studies. I do think that her clinical picture is more consistent with spinal stenosis with neurogenic claudication, although she does have the degenerative disease at L5-S1, I do not think that it is a major pain source at this time. I think the best course of action is addressing only the stenosis at L3-4. I think this is going to be an easier surgery for her to recover from and I think it is going to provide better relief of the neuropathic leg pain. I did tell her that in the future, she may have ongoing back pain that could necessitate a fusion and we can always do that at that time, but I really think that the bulk of her problem is secondary to the neurogenic claudication from her underlying spinal stenosis, which is most problematic at the L3-4 level. We have gone over the risks of a decompression, which include infection, bleeding, nerve damage, death, stroke, paralysis, ongoing or worse pain, need for further surgery, leak of spinal fluid, need for fusion surgery. All of her questions were encouraged and addressed.

## 2015-05-14 ENCOUNTER — Other Ambulatory Visit: Payer: Self-pay

## 2015-05-14 NOTE — Telephone Encounter (Signed)
Patient states that she had the Lumbar Decompression surgery on 04/21/2015 and that surgery was success. She states that she is off of the Gabapentin and back on Lyrica. She would like to know if we could fax her Lyrica Rx to [facility].

## 2015-05-17 NOTE — Telephone Encounter (Signed)
LM informing patient to call Dr. Rolena Infante office for refills.

## 2015-05-18 ENCOUNTER — Telehealth: Payer: Self-pay | Admitting: *Deleted

## 2015-05-18 NOTE — Telephone Encounter (Signed)
Courtney [name] requested call back about her Lyrica refill.  She states that she is off all of her pain medication post surgery (oxycodone) and is wanting to start back on the lyrica but Dr Rolena Infante wants all meds to go through Dr Letta Pate now.  She would like to have refills on her lyrica.  Please advise.

## 2015-05-18 NOTE — Telephone Encounter (Signed)
Call in Lyrica 100 mg  Sig : Take 1 capsule (100 mg total) by mouth 5 (five) times daily.    Route: Oral     150 tablets with one refill

## 2015-05-18 NOTE — Telephone Encounter (Signed)
Courtney [name] called requesting a call back about her refill.  See telephone message.

## 2015-05-19 MED ORDER — PREGABALIN 100 MG PO CAPS: 100.0000 mg | ORAL_CAPSULE | Freq: Every day | ORAL | Status: DC

## 2015-05-19 NOTE — Telephone Encounter (Signed)
Prescription printed, awaiting signature, will be [facility]ed to [facility] [phone]

## 2015-05-25 ENCOUNTER — Telehealth: Payer: Self-pay | Admitting: *Deleted

## 2015-05-25 MED ORDER — PREGABALIN 100 MG PO CAPS: 100.0000 mg | ORAL_CAPSULE | Freq: Every day | ORAL | Status: DC

## 2015-05-25 NOTE — Telephone Encounter (Signed)
Eternity called and says she has not heard from [facility] on the Lyrica.  I called and they do not have the order.  I have reprinted RX and will fax with request to expedite order delivery.  [phone]  Order signed, faxed to [facility] and confirmation of fax received obtained. Seena notified.

## 2015-10-27 ENCOUNTER — Ambulatory Visit (INDEPENDENT_AMBULATORY_CARE_PROVIDER_SITE_OTHER): Payer: BLUE CROSS/BLUE SHIELD

## 2015-10-27 ENCOUNTER — Ambulatory Visit (INDEPENDENT_AMBULATORY_CARE_PROVIDER_SITE_OTHER): Payer: BLUE CROSS/BLUE SHIELD | Admitting: Podiatry

## 2015-10-27 VITALS — BP 123/66 | HR 77 | Resp 16

## 2015-10-27 DIAGNOSIS — D3610 Benign neoplasm of peripheral nerves and autonomic nervous system, unspecified: Secondary | ICD-10-CM | POA: Diagnosis not present

## 2015-10-27 NOTE — Progress Notes (Signed)
Subjective:     Patient ID: Courtney [name], female   DOB: 12-31-1950, 65 y.o.   MRN: [id number]  HPI patient states I've had a chronic neuroma for over 40 years and I'm looking forward to getting it fixed as it's becoming more of a nuisance form   Review of Systems  All other systems reviewed and are negative.      Objective:   Physical Exam  Constitutional: She is oriented to person, place, and time.  Cardiovascular: Intact distal pulses.   Musculoskeletal: Normal range of motion.  Neurological: She is oriented to person, place, and time.  Skin: Skin is warm.  Nursing note and vitals reviewed.  neurovascular status is found to be intact with muscle strength adequate range of motion within normal limits. Patient's found to have a large mass lying within the third interspace left with shooting pains into the adjacent digits when I palpated the area and squeeze the metatarsals. She states she gets burning and a lot of numbness between the toes and that this is been diagnosed over the years and she's tried orthotics and shoe gear modifications. Good digital perfusion is noted and patient has no equinus and is well oriented 3     Assessment:     Long-term neuroma symptomatology left that is getting more symptomatic    Plan:     H&P conditions reviewed with patient. Patient wants to get this fixed and I recommended surgical correction and due to the long-standing nature she would rather go this route then consider other treatments which I did discuss with her. At this time since she's definitive about treatment I did allow her to read consent form reviewing alternative treatments complications and the fact there is no long-term guarantees. Patient wants surgery signed consent form and is given all preoperative instructions at this time. She is encouraged to call with any questions prior to procedure

## 2015-10-27 NOTE — Patient Instructions (Signed)

## 2015-10-27 NOTE — Progress Notes (Signed)
   Subjective:    Patient ID: Courtney [name], female    DOB: June 14, 1950, 65 y.o.   MRN: [id number]  HPI    Review of Systems  All other systems reviewed and are negative.      Objective:   Physical Exam        Assessment & Plan:

## 2015-11-02 ENCOUNTER — Telehealth: Payer: Self-pay | Admitting: *Deleted

## 2015-11-02 NOTE — Telephone Encounter (Signed)
"  I have surgery scheduled with Dr. Paulla Dolly a week from today.  I'm going to need to postpone that because I have a travel opportunity.  I don't have a date in mind yet.  I'm going to have to call you when I'm able to be with my foot for traveling for a few days.  You can call me if you like."

## 2015-11-02 NOTE — Telephone Encounter (Signed)
I'm returning your call.  "I'm in a position to reschedule my surgery, now that I know my travel plans."  When would you like to reschedule to?  "I'd like to do it June 27."  I'll get it rescheduled.  "Do we know a time?"  Someone from the surgical center will call with the arrival time the Friday or Monday before surgery date.  I hope you enjoy your trip.  "Thanks so much."  I called and left a message for Caren Griffins at [facility] to reschedule surgery from 11/09/2015 to 12/07/2015.

## 2015-11-19 ENCOUNTER — Other Ambulatory Visit: Payer: Self-pay

## 2015-12-07 ENCOUNTER — Encounter: Payer: Self-pay | Admitting: Podiatry

## 2015-12-07 DIAGNOSIS — G5762 Lesion of plantar nerve, left lower limb: Secondary | ICD-10-CM | POA: Diagnosis not present

## 2015-12-08 ENCOUNTER — Telehealth: Payer: Self-pay | Admitting: *Deleted

## 2015-12-08 NOTE — Telephone Encounter (Addendum)
Post op courtesy call-Pt states she's doing okay. I instructed pt not to weight bear or dangle foot more than 15 mins/hour, remain in the surgical shoe at all times, keep dressing clean and dry until 1st POV, and call with concerns.  Pt states she doesn't like to take Acetaminophen and wanted to know if Dr. Paulla Dolly would prescribe Oxycodone plain, instead of the Hydrocodone.  I told pt I would inform Dr. Paulla Dolly of her request, and call again. 12/09/2015-Dr. Regal okayed Oxycodone 10 mg #30 one tablet every 4-6 hours prn foot pain.  Left message informing pt the rx would need to be picked up in the [place] office. 04/07/2016-Pt states she is going to her doctor to discuss a colonoscopy, and she would like to have the name of the anesthesia used for her last foot surgery. Left message with [facility]'s main phone to call for the information.

## 2015-12-09 MED ORDER — OXYCODONE HCL 10 MG PO TABS: ORAL_TABLET | ORAL | Status: DC

## 2015-12-17 ENCOUNTER — Encounter: Payer: Self-pay | Admitting: Podiatry

## 2015-12-17 ENCOUNTER — Ambulatory Visit (INDEPENDENT_AMBULATORY_CARE_PROVIDER_SITE_OTHER): Payer: BLUE CROSS/BLUE SHIELD | Admitting: Podiatry

## 2015-12-17 DIAGNOSIS — M722 Plantar fascial fibromatosis: Secondary | ICD-10-CM | POA: Diagnosis not present

## 2015-12-17 DIAGNOSIS — D3610 Benign neoplasm of peripheral nerves and autonomic nervous system, unspecified: Secondary | ICD-10-CM | POA: Diagnosis not present

## 2015-12-17 NOTE — Patient Instructions (Signed)

## 2015-12-19 NOTE — Progress Notes (Signed)
Subjective:     Patient ID: Courtney [name], female   DOB: 05/06/1951, 65 y.o.   MRN: [id number]  HPI patient presents with improvement in the left third interspace and surgery which is doing well with minimal discomfort or pain   Review of Systems     Objective:   Physical Exam Neurovascular status intact muscle strength was found to be adequate with well-healed surgical site left third interspace with wound edges well coapted minimal discomfort and numbness between the toes. Negative Homans sign noted    Assessment:     Doing well post neurectomy third interspace left    Plan:     Advised on physical therapy anti-inflammatories and patient needs to wear wider shoes. Patient will be gradually able to wear shoes starting in 3 weeks and compression and immobilization will be continued

## 2016-01-05 ENCOUNTER — Other Ambulatory Visit: Payer: Self-pay | Admitting: Cardiovascular Disease

## 2016-02-11 ENCOUNTER — Other Ambulatory Visit: Payer: Self-pay | Admitting: Family Medicine

## 2016-02-11 ENCOUNTER — Other Ambulatory Visit (HOSPITAL_COMMUNITY)
Admission: RE | Admit: 2016-02-11 | Discharge: 2016-02-11 | Disposition: A | Discharge status: Home / Self Care | Payer: BLUE CROSS/BLUE SHIELD | Source: Ambulatory Visit | Attending: Family Medicine | Admitting: Family Medicine

## 2016-02-11 DIAGNOSIS — R160 Hepatomegaly, not elsewhere classified: Secondary | ICD-10-CM

## 2016-02-11 DIAGNOSIS — Z124 Encounter for screening for malignant neoplasm of cervix: Secondary | ICD-10-CM | POA: Diagnosis not present

## 2016-02-16 LAB — CYTOLOGY - PAP

## 2016-02-25 ENCOUNTER — Ambulatory Visit
Admission: RE | Admit: 2016-02-25 | Discharge: 2016-02-25 | Disposition: A | Discharge status: Home / Self Care | Payer: No Typology Code available for payment source | Source: Ambulatory Visit | Attending: Family Medicine | Admitting: Family Medicine

## 2016-02-25 DIAGNOSIS — R160 Hepatomegaly, not elsewhere classified: Secondary | ICD-10-CM

## 2016-03-01 ENCOUNTER — Other Ambulatory Visit: Payer: Self-pay | Admitting: Family Medicine

## 2016-03-01 DIAGNOSIS — Z1231 Encounter for screening mammogram for malignant neoplasm of breast: Secondary | ICD-10-CM

## 2016-03-06 NOTE — Progress Notes (Signed)
DOS 06.27.2017 Excision Soft Tissue Mass (suspect neuroma) 3rd Innerspace Left Foot

## 2016-03-21 ENCOUNTER — Ambulatory Visit: Payer: Self-pay

## 2016-04-04 ENCOUNTER — Other Ambulatory Visit: Payer: Self-pay | Admitting: Cardiovascular Disease

## 2016-04-05 ENCOUNTER — Encounter: Payer: Self-pay | Admitting: Neurology

## 2016-04-05 ENCOUNTER — Ambulatory Visit (INDEPENDENT_AMBULATORY_CARE_PROVIDER_SITE_OTHER): Payer: BLUE CROSS/BLUE SHIELD | Admitting: Neurology

## 2016-04-05 ENCOUNTER — Ambulatory Visit (INDEPENDENT_AMBULATORY_CARE_PROVIDER_SITE_OTHER): Payer: BLUE CROSS/BLUE SHIELD

## 2016-04-05 VITALS — BP 111/61 | HR 70 | Ht 64.0 in | Wt 160.0 lb

## 2016-04-05 DIAGNOSIS — I639 Cerebral infarction, unspecified: Secondary | ICD-10-CM

## 2016-04-05 DIAGNOSIS — R531 Weakness: Secondary | ICD-10-CM

## 2016-04-05 DIAGNOSIS — R200 Anesthesia of skin: Secondary | ICD-10-CM | POA: Diagnosis not present

## 2016-04-05 DIAGNOSIS — R29898 Other symptoms and signs involving the musculoskeletal system: Secondary | ICD-10-CM | POA: Diagnosis not present

## 2016-04-05 NOTE — Progress Notes (Signed)
[facility]    Provider:  Dr Jaynee Eagles Referring Provider: Melina Schools, MD Primary Care Physician:  Osborne Casco, MD  CC:  Right leg weakness  HPI:  Courtney [name] is a 65 y.o. female here as a referral from Dr. Rolena Infante for right leg weakness, numbness and tingling. Past medical history chronic pain syndrome, degenerative intervertebral disc disease C4-C5, C5-C6 with myelopathy, lumbar degenerative disc disease status post laminectomy, trochanteric bursitis of the right hip, typical spinal stenosis, heart murmur, sleep apnea, peripheral neuropathy, anxiety, migraine, depression, fibromyalgia, high cholesterol, pain in the cervical spine and lower spine/SI joints. Patient is here with her friend who also provides information. She had an MRI scheduled and on the 16th she was crouching, sitting, weeding in her front yard and by the end of the day her gait was peculiar and the next day it was a little bit worse, she had an MRI L-spine, the next day had acute tingling in the upper right thigh anterolateral thigh, she was dragging her foot, she had a lot of numbness and tingling from midline to the midline in the back below the waist, she couldn't wiggle her toes, couldn't lift her foot, she felt dehydrated and "off". F/u showed nothing on the MRI would cause the diffuse new right leg weakness. In the last few days she can lift her foot a little bit better. She is slightly improved, she can lift her knee a little now, she can wiggle her toes, she can walk up and down stairs slowly and carefully, some improvement, the whole leg was numb with dysesthesias. She is on a baby aspirin daily. No other associated symptoms, modifying factors or focal neurologic deficits.  Reviewed notes, labs and imaging from outside physicians, which showed: Patient is a 65 year old who follows with [facility] orthopedics for their back. Patient has a past medical history of surgery in November 2016.  Patient reports pain, doing poorly, foot drop and right leg weakness since having an MRI, 4 day history of significant diffuse right leg weakness documented that appointment 03/31/2016. On exam patient had diffuse weakness to out of 5 hip flexor, quad, hamstring, gastroc, extensor hallucis longus and tibialis anterior and the right side. 2+ symmetric DTRs, negative Babinski, intact peripheral pulses, 5 out of strength 5 in the left lower extremity. Also describes diffuse numbness in dysesthetic numbness in the right leg. This is in the setting of significant worsening back pain.  Personally reviewed MRI images of the lumbar spine dated 03/27/2016 which patient brings on disc with her today. Demonstrates progressive spinal stenosis and foraminal disease at L4-L5, severe left and moderate right facet arthrosis, no new left extraforaminal disc extrusion resulting in progression of the spinal stenosis and moderate foraminal stenosis in the right, mild to moderate foraminal stenosis on the left, resolution of the spinal canal stenosis at the L3-L4 level which was the surgical level, moderate degenerative changes at L5-S1.  Review of Systems: Patient complains of symptoms per HPI as well as the following symptoms: Murmur, snoring, numbness, weakness and joint pain, ringing in ears, joint swelling, allergies, irritability, difficulty with concentration at times. Pertinent negatives per HPI. All others negative.   Social History   Social History  . Marital status: Widowed    Spouse name: N/A  . Number of children: 2  . Years of education: Master's    Occupational History  . Retired     Social History Main Topics  . Smoking status: Never Smoker  . Smokeless tobacco: Never  Used  . Alcohol use 0.6 oz/week    1 Glasses of wine per week     Comment: socially  . Drug use: No  . Sexual activity: No   Other Topics Concern  . Not on file   Social History Narrative   Lives alone   Caffeine use: 2 cups  coffee/day    Family History  Problem Relation Age of Onset  . COPD Mother   . Cancer Mother   . Hypertension Mother   . Hyperlipidemia Mother   . Heart disease Mother     Past Medical History:  Diagnosis Date  . Arthritis   . Depression   . Fibromyalgia   . Hyperlipidemia     Past Surgical History:  Procedure Laterality Date  . ABDOMINOPLASTY    . HERNIA REPAIR    . LUMBAR LAMINECTOMY/DECOMPRESSION MICRODISCECTOMY  04/21/2015   Procedure: DECOMPRESSION LUMBAR THREE-LUMBAR FOUR;  Surgeon: Melina Schools, MD;  Location: [place];  Service: Orthopedics;;  . PARATHYROIDECTOMY  2012  . SHOULDER ARTHROSCOPY W/ ACROMIAL REPAIR Right   . TONSILLECTOMY      Current Outpatient Prescriptions  Medication Sig Dispense Refill  . Ascorbic Acid (VITAMIN C PO) Take 1,000 mg by mouth daily.    Marland Kitchen aspirin 81 MG EC tablet Take 81 mg by mouth daily.    Marland Kitchen atorvastatin (LIPITOR) 40 MG tablet TAKE 1 TABLET DAILY 90 tablet 0  . b complex vitamins capsule Take 1 capsule by mouth daily.     . Cholecalciferol (VITAMIN D-1000 MAX ST) 1000 UNITS tablet Take 1,000 Units by mouth daily.     . DULoxetine (CYMBALTA) 30 MG capsule Take 60 mg by mouth daily.    . fluticasone (FLONASE) 50 MCG/ACT nasal spray Place 2 sprays into both nostrils at bedtime.     Marland Kitchen MAGNESIUM ASPARTATE HCL PO Take 1 tablet by mouth daily.    . Multiple Vitamins-Minerals (MULTIVITAMIN WITH MINERALS) tablet Take 1 tablet by mouth every other day.     . Omega-3 Fatty Acids (OMEGA 3 PO) Take 1 capsule by mouth daily.     Marland Kitchen omeprazole (PRILOSEC) 20 MG capsule Take 20 mg by mouth daily.     . polycarbophil (FIBERCON) 625 MG tablet Take 625 mg by mouth daily.     . pregabalin (LYRICA) 100 MG capsule Take 1 capsule (100 mg total) by mouth 5 (five) times daily. 450 capsule 3  . PREMARIN vaginal cream Place 1 Applicatorful vaginally at bedtime. Use as directed    . urea (CARMOL) 40 % CREA Apply 1 application topically daily.     . vitamin E  400 UNIT capsule Take 1 capsule by mouth daily.      No current facility-administered medications for this visit.     Allergies as of 04/05/2016 - Review Complete 04/05/2016  Allergen Reaction Noted  . Fish allergy Anaphylaxis 06/23/2014  . Peanuts [peanut oil] Anaphylaxis 06/23/2014    Vitals: BP 111/61 (BP Location: Right Arm, Patient Position: Sitting, Cuff Size: Normal)   Pulse 70   Ht 5\' 4"  (1.626 m)   Wt 160 lb (72.6 kg)   LMP  (LMP Unknown)   BMI 27.46 kg/m  Last Weight:  Wt Readings from Last 1 Encounters:  04/05/16 160 lb (72.6 kg)   Last Height:   Ht Readings from Last 1 Encounters:  04/05/16 5\' 4"  (1.626 m)    Physical exam: Exam: Gen: NAD, conversant, well nourised, well groomed  CV: RRR, +murmur. No Carotid Bruits. No peripheral edema, warm, nontender Eyes: Conjunctivae clear without exudates or hemorrhage  Neuro: Detailed Neurologic Exam  Speech:    Speech is normal; fluent and spontaneous with normal comprehension.  Cognition:    The patient is oriented to person, place, and time;     recent and remote memory intact;     language fluent;     normal attention, concentration,     fund of knowledge Cranial Nerves:    The pupils are equal, round, and reactive to light. The fundi are normal and spontaneous venous pulsations are present. Visual fields are full to finger confrontation. Extraocular movements are intact. Trigeminal sensation is intact and the muscles of mastication are normal. Mild right NL flattening (chronic) otherwise the face is symmetric. The palate elevates in the midline. Hearing intact. Voice is normal. Shoulder shrug is normal. The tongue has normal motion without fasciculations.   Coordination:    Normal finger to nose and heel to shin. Normal rapid alternating movements.   Gait:    Can walk on toes, but weaknes of the right on heel walk, imbalances  Motor Observation:    No asymmetry, no atrophy, and no  involuntary movements noted. Tone:    Normal muscle tone.    Posture:    Posture is normal. normal erect    Strength: Can get out of the seat without using hands.     Right leg: 3/5 hip flexion, 4/5 biceps femoris, 5/5 leg extension, 3+ DF/inversiona nd eversion     Sensation: There is some patchy numbness in the right leg not in a dermatomal pattern      Reflex Exam:  DTR's:     Absent AJs otherwise deep tendon reflexes in the upper and lower extremities are more brisk on the right.   Toes:    Left down, right equiv   Clonus:    Clonus is absent.      Assessment/Plan:   ANYELI FIETZ is a 65 y.o. female here as a referral from Dr. Rolena Infante for right leg weakness, numbness and tingling. Past medical history chronic pain syndrome, degenerative intervertebral disc disease C4-C5, C5-C6 with myelopathy, lumbar degenerative disc disease status post laminectomy, trochanteric bursitis of the right hip, typical spinal stenosis, heart murmur, sleep apnea, peripheral neuropathy, anxiety, migraine, depression, fibromyalgia, high cholesterol, pain in the cervical spine and lower spine/SI joints and lumbar spinal stenosis status post decompression surgery who presents with acute onset right leg weakness with sensory changes. She is slowly improving. Weakness was acute in the right leg with sensory changes. Need to rule out stroke initially with MRI of the brain. May also consider thoracic MRI given that day she was crouching over to evaluate for disc disease or myelopathy (MRI of the cervical spine and lumbar spine already completed). We'll hold off on EMG nerve conduction study as patient is improving   Sarina Ill, MD  [facility] [address] [place], [place] [address]  Phone [phone] Fax [phone]

## 2016-04-05 NOTE — Patient Instructions (Signed)
Remember to drink plenty of fluid, eat healthy meals and do not skip any meals. Try to eat protein with a every meal and eat a healthy snack such as fruit or nuts in between meals. Try to keep a regular sleep-wake schedule and try to exercise daily, particularly in the form of walking, 20-30 minutes a day, if you can.   As far as diagnostic testing: MRI brain  Our phone number is [phone]. We also have an after hours call service for urgent matters and there is a physician on-call for urgent questions. For any emergencies you know to call 911 or go to the nearest emergency room

## 2016-04-07 ENCOUNTER — Other Ambulatory Visit: Payer: Self-pay | Admitting: Neurology

## 2016-04-07 ENCOUNTER — Telehealth: Payer: Self-pay | Admitting: *Deleted

## 2016-04-07 DIAGNOSIS — R29898 Other symptoms and signs involving the musculoskeletal system: Secondary | ICD-10-CM

## 2016-04-07 DIAGNOSIS — M546 Pain in thoracic spine: Secondary | ICD-10-CM

## 2016-04-07 DIAGNOSIS — M5104 Intervertebral disc disorders with myelopathy, thoracic region: Secondary | ICD-10-CM

## 2016-04-07 NOTE — Telephone Encounter (Signed)
-----   Message from Melvenia Beam, MD sent at 04/07/2016 12:56 PM EDT ----- Unremarkable MRI brain no strokes or cause for symptoms which is great news

## 2016-04-07 NOTE — Telephone Encounter (Signed)
Called and spoke to pt about MRI results per Dr Jaynee Eagles note. She verbalized understanding.  She asked if she should proceed with MRI thoracic per Dr Jaynee Eagles recommendation. I placed pt on hold and spoke w/ Dr. Jaynee Eagles. Dr Jaynee Eagles ok w/ this. She placed order while pt on phone.  I relayed to pt that she should be called within a week or so to schedule. She verbalized understanding.

## 2016-04-15 ENCOUNTER — Ambulatory Visit
Admission: RE | Admit: 2016-04-15 | Discharge: 2016-04-15 | Disposition: A | Discharge status: Home / Self Care | Payer: BLUE CROSS/BLUE SHIELD | Source: Ambulatory Visit | Attending: Neurology | Admitting: Neurology

## 2016-04-15 DIAGNOSIS — R29898 Other symptoms and signs involving the musculoskeletal system: Secondary | ICD-10-CM | POA: Diagnosis not present

## 2016-04-15 DIAGNOSIS — M546 Pain in thoracic spine: Secondary | ICD-10-CM | POA: Diagnosis not present

## 2016-04-15 DIAGNOSIS — M5104 Intervertebral disc disorders with myelopathy, thoracic region: Secondary | ICD-10-CM | POA: Diagnosis not present

## 2016-04-17 ENCOUNTER — Telehealth: Payer: Self-pay | Admitting: *Deleted

## 2016-04-17 NOTE — Telephone Encounter (Signed)
Called and spoke to pt about MRI results per AA,MD note. She verbalized understanding. She knows she does not have a f/u w/ [place] but I advised pt to call back if she would like to make one in the future. She has appt to see Dr Rolena Infante today. She will let him know about results as well.

## 2016-04-17 NOTE — Telephone Encounter (Signed)
-----   Message from Melvenia Beam, MD sent at 04/16/2016  8:36 PM EST ----- MRI of the thoracic spine shows some arthriitic changes including some disc bulging at several levels and disk height loss at one level but nothing concerning and nothing to cause her right leg weakness the spinal cord is normal thanks

## 2016-04-18 ENCOUNTER — Ambulatory Visit: Payer: BLUE CROSS/BLUE SHIELD | Admitting: Neurology

## 2016-05-15 DIAGNOSIS — M545 Low back pain: Secondary | ICD-10-CM | POA: Diagnosis not present

## 2016-05-15 DIAGNOSIS — M21371 Foot drop, right foot: Secondary | ICD-10-CM | POA: Diagnosis not present

## 2016-05-16 DIAGNOSIS — G629 Polyneuropathy, unspecified: Secondary | ICD-10-CM | POA: Diagnosis not present

## 2016-05-18 DIAGNOSIS — M21371 Foot drop, right foot: Secondary | ICD-10-CM | POA: Diagnosis not present

## 2016-05-18 DIAGNOSIS — M545 Low back pain: Secondary | ICD-10-CM | POA: Diagnosis not present

## 2016-05-19 DIAGNOSIS — M533 Sacrococcygeal disorders, not elsewhere classified: Secondary | ICD-10-CM | POA: Diagnosis not present

## 2016-05-19 DIAGNOSIS — M4802 Spinal stenosis, cervical region: Secondary | ICD-10-CM | POA: Diagnosis not present

## 2016-05-19 DIAGNOSIS — M50023 Cervical disc disorder at C6-C7 level with myelopathy: Secondary | ICD-10-CM | POA: Diagnosis not present

## 2016-05-19 DIAGNOSIS — M48061 Spinal stenosis, lumbar region without neurogenic claudication: Secondary | ICD-10-CM | POA: Diagnosis not present

## 2016-05-22 DIAGNOSIS — M545 Low back pain: Secondary | ICD-10-CM | POA: Diagnosis not present

## 2016-05-22 DIAGNOSIS — M21371 Foot drop, right foot: Secondary | ICD-10-CM | POA: Diagnosis not present

## 2016-05-23 ENCOUNTER — Ambulatory Visit
Admission: RE | Admit: 2016-05-23 | Discharge: 2016-05-23 | Disposition: A | Discharge status: Home / Self Care | Payer: Medicare Other | Source: Ambulatory Visit | Attending: Family Medicine | Admitting: Family Medicine

## 2016-05-23 ENCOUNTER — Other Ambulatory Visit: Payer: Self-pay | Admitting: Family Medicine

## 2016-05-23 DIAGNOSIS — Z8639 Personal history of other endocrine, nutritional and metabolic disease: Secondary | ICD-10-CM | POA: Diagnosis not present

## 2016-05-23 DIAGNOSIS — E785 Hyperlipidemia, unspecified: Secondary | ICD-10-CM | POA: Diagnosis not present

## 2016-05-23 DIAGNOSIS — R7303 Prediabetes: Secondary | ICD-10-CM | POA: Diagnosis not present

## 2016-05-23 DIAGNOSIS — E781 Pure hyperglyceridemia: Secondary | ICD-10-CM | POA: Diagnosis not present

## 2016-05-23 DIAGNOSIS — R05 Cough: Secondary | ICD-10-CM | POA: Diagnosis not present

## 2016-05-23 DIAGNOSIS — R059 Cough, unspecified: Secondary | ICD-10-CM

## 2016-05-23 DIAGNOSIS — M2550 Pain in unspecified joint: Secondary | ICD-10-CM | POA: Diagnosis not present

## 2016-05-23 DIAGNOSIS — Z136 Encounter for screening for cardiovascular disorders: Secondary | ICD-10-CM | POA: Diagnosis not present

## 2016-05-23 DIAGNOSIS — L659 Nonscarring hair loss, unspecified: Secondary | ICD-10-CM | POA: Diagnosis not present

## 2016-05-25 ENCOUNTER — Ambulatory Visit: Payer: Self-pay | Admitting: Physician Assistant

## 2016-05-25 DIAGNOSIS — M545 Low back pain: Secondary | ICD-10-CM | POA: Diagnosis not present

## 2016-05-25 DIAGNOSIS — M21371 Foot drop, right foot: Secondary | ICD-10-CM | POA: Diagnosis not present

## 2016-06-07 ENCOUNTER — Ambulatory Visit
Admission: RE | Admit: 2016-06-07 | Discharge: 2016-06-07 | Disposition: A | Discharge status: Home / Self Care | Payer: Medicare Other | Source: Ambulatory Visit | Attending: Family Medicine | Admitting: Family Medicine

## 2016-06-07 DIAGNOSIS — M545 Low back pain: Secondary | ICD-10-CM | POA: Diagnosis not present

## 2016-06-07 DIAGNOSIS — Z1231 Encounter for screening mammogram for malignant neoplasm of breast: Secondary | ICD-10-CM | POA: Diagnosis not present

## 2016-06-07 DIAGNOSIS — M21371 Foot drop, right foot: Secondary | ICD-10-CM | POA: Diagnosis not present

## 2016-06-13 DIAGNOSIS — M21371 Foot drop, right foot: Secondary | ICD-10-CM | POA: Diagnosis not present

## 2016-06-13 DIAGNOSIS — M545 Low back pain: Secondary | ICD-10-CM | POA: Diagnosis not present

## 2016-06-14 DIAGNOSIS — K5730 Diverticulosis of large intestine without perforation or abscess without bleeding: Secondary | ICD-10-CM | POA: Diagnosis not present

## 2016-06-14 DIAGNOSIS — Q438 Other specified congenital malformations of intestine: Secondary | ICD-10-CM | POA: Diagnosis not present

## 2016-06-14 DIAGNOSIS — Z86010 Personal history of colonic polyps: Secondary | ICD-10-CM | POA: Diagnosis not present

## 2016-06-14 DIAGNOSIS — K5520 Angiodysplasia of colon without hemorrhage: Secondary | ICD-10-CM | POA: Diagnosis not present

## 2016-06-14 DIAGNOSIS — K648 Other hemorrhoids: Secondary | ICD-10-CM | POA: Diagnosis not present

## 2016-06-15 DIAGNOSIS — M545 Low back pain: Secondary | ICD-10-CM | POA: Diagnosis not present

## 2016-06-15 DIAGNOSIS — M21371 Foot drop, right foot: Secondary | ICD-10-CM | POA: Diagnosis not present

## 2016-06-16 ENCOUNTER — Encounter: Payer: Self-pay | Admitting: Endocrinology

## 2016-06-16 ENCOUNTER — Ambulatory Visit (INDEPENDENT_AMBULATORY_CARE_PROVIDER_SITE_OTHER): Payer: Medicare Other | Admitting: Endocrinology

## 2016-06-16 VITALS — BP 110/70 | HR 72 | Ht 64.37 in | Wt 167.4 lb

## 2016-06-16 DIAGNOSIS — K5903 Drug induced constipation: Secondary | ICD-10-CM | POA: Diagnosis not present

## 2016-06-16 DIAGNOSIS — R5383 Other fatigue: Secondary | ICD-10-CM | POA: Diagnosis not present

## 2016-06-16 DIAGNOSIS — M4802 Spinal stenosis, cervical region: Secondary | ICD-10-CM | POA: Diagnosis not present

## 2016-06-16 DIAGNOSIS — N951 Menopausal and female climacteric states: Secondary | ICD-10-CM | POA: Diagnosis not present

## 2016-06-16 DIAGNOSIS — T402X5D Adverse effect of other opioids, subsequent encounter: Secondary | ICD-10-CM | POA: Diagnosis not present

## 2016-06-16 LAB — T4, FREE: FREE T4: 0.67 ng/dL (ref 0.60–1.60)

## 2016-06-16 LAB — T3, FREE: T3, Free: 2.6 pg/mL (ref 2.3–4.2)

## 2016-06-16 LAB — FOLLICLE STIMULATING HORMONE: FSH: 62.7 m[IU]/mL

## 2016-06-16 NOTE — Progress Notes (Signed)
Patient ID: Courtney [name], female   DOB: 01-May-1951, 66 y.o.   MRN: [id number]            Referring physician: Kelton Pillar  Chief complaint: Tiredness  History of Present Illness:  Patient said that she has been feeling fatigued for several years, this is not any worse recently. She has also had difficulty with tending to gain weight.  She thinks she has gained 10 pounds in the last year and she thinks her diet is fairly good. She does not complain of any cold intolerance except having cold sensations in her feet. She does not tolerate extremes of temperature ranges She has had some tendency to dry skin and hair loss Has not had any difficulty with concentration or memory  She has had thyroid functions done by her PCP but even though she was told these were normal she thinks she needs to be on thyroid supplements because her family members also have low thyroid levels and did benefit from taking thyroid supplements  Most recent thyroid levels that the patient has brought that were done by PCP in December showed TSH of 1.8 and total T4 of 6.0 TSH in 9/17 was 2.5   Past Medical History:  Diagnosis Date  . Arthritis   . Depression   . Fibromyalgia   . Hyperlipidemia     Past Surgical History:  Procedure Laterality Date  . ABDOMINOPLASTY    . HERNIA REPAIR    . LUMBAR LAMINECTOMY/DECOMPRESSION MICRODISCECTOMY  04/21/2015   Procedure: DECOMPRESSION LUMBAR THREE-LUMBAR FOUR;  Surgeon: Melina Schools, MD;  Location: [facility];  Service: Orthopedics;;  . PARATHYROIDECTOMY  2012  . SHOULDER ARTHROSCOPY W/ ACROMIAL REPAIR Right   . TONSILLECTOMY      Family History  Problem Relation Age of Onset  . COPD Mother   . Cancer Mother   . Hypertension Mother   . Hyperlipidemia Mother   . Heart disease Mother     Social History:  reports that she has never smoked. She has never used smokeless tobacco. She reports that she drinks about 0.6 oz of alcohol per week . She reports that  she does not use drugs.  Allergies:  Allergies  Allergen Reactions  . Fish Allergy Anaphylaxis    Can tolerate shellfish   . Peanuts [Peanut Oil] Anaphylaxis    ALL NUTS and their derivatives    Allergies as of 06/16/2016      Reactions   Fish Allergy Anaphylaxis   Can tolerate shellfish    Peanuts [peanut Oil] Anaphylaxis   ALL NUTS and their derivatives      Medication List       Accurate as of 06/16/16  1:59 PM. Always use your most recent med list.          aspirin 81 MG EC tablet Take 81 mg by mouth daily.   atorvastatin 40 MG tablet Commonly known as:  LIPITOR TAKE 1 TABLET DAILY   DULoxetine 30 MG capsule Commonly known as:  CYMBALTA Take 60 mg by mouth daily.   EPIPEN JR 2-PAK 0.15 MG/0.3ML injection Generic drug:  EPINEPHrine Inject 0.15 mg into the muscle as needed for anaphylaxis.   lidocaine 5 % Commonly known as:  LIDODERM Place 1 patch onto the skin daily. Remove & Discard patch within 12 hours or as directed by MD   multivitamin with minerals tablet Take 1 tablet by mouth every other day.   naproxen 250 MG tablet Commonly known as:  NAPROSYN Take by  mouth 2 (two) times daily with a meal.   OMEGA 3 PO Take 1 capsule by mouth daily.   omeprazole 20 MG capsule Commonly known as:  PRILOSEC Take 20 mg by mouth daily.   oxycodone 5 MG capsule Commonly known as:  OXY-IR Take 5 mg by mouth every 4 (four) hours as needed.   pregabalin 100 MG capsule Commonly known as:  LYRICA Take 1 capsule (100 mg total) by mouth 5 (five) times daily.   traZODone 100 MG tablet Commonly known as:  DESYREL Take 100 mg by mouth at bedtime.   urea 40 % Crea Commonly known as:  CARMOL Apply 1 application topically daily.   VOLTAREN 1 % Gel Generic drug:  diclofenac sodium Apply topically 4 (four) times daily.       LABS:  No visits with results within 1 Week(s) from this visit.  Latest known visit with results is:  Hospital Outpatient Visit on  04/16/2015  Component Date Value Ref Range Status  . WBC 04/16/2015 5.4  4.0 - 10.5 K/uL Final  . RBC 04/16/2015 4.05  3.87 - 5.11 MIL/uL Final  . Hemoglobin 04/16/2015 12.5  12.0 - 15.0 g/dL Final  . HCT 04/16/2015 36.8  36.0 - 46.0 % Final  . MCV 04/16/2015 90.9  78.0 - 100.0 fL Final  . MCH 04/16/2015 30.9  26.0 - 34.0 pg Final  . MCHC 04/16/2015 34.0  30.0 - 36.0 g/dL Final  . RDW 04/16/2015 12.2  11.5 - 15.5 % Final  . Platelets 04/16/2015 191  150 - 400 K/uL Final  . Sodium 04/16/2015 139  135 - 145 mmol/L Final  . Potassium 04/16/2015 4.2  3.5 - 5.1 mmol/L Final  . Chloride 04/16/2015 106  101 - 111 mmol/L Final  . CO2 04/16/2015 26  22 - 32 mmol/L Final  . Glucose, Bld 04/16/2015 117* 65 - 99 mg/dL Final  . BUN 04/16/2015 15  6 - 20 mg/dL Final  . Creatinine, Ser 04/16/2015 0.78  0.44 - 1.00 mg/dL Final  . Calcium 04/16/2015 9.7  8.9 - 10.3 mg/dL Final  . GFR calc non Af Amer 04/16/2015 >60  >60 mL/min Final  . GFR calc Af Amer 04/16/2015 >60  >60 mL/min Final   Comment: (NOTE) The eGFR has been calculated using the CKD EPI equation. This calculation has not been validated in all clinical situations. eGFR's persistently <60 mL/min signify possible Chronic Kidney Disease.   . Anion gap 04/16/2015 7  5 - 15 Final  . MRSA, PCR 04/16/2015 NEGATIVE  NEGATIVE Final  . Staphylococcus aureus 04/16/2015 NEGATIVE  NEGATIVE Final   Comment:        The Xpert SA Assay (FDA approved for NASAL specimens in patients over 58 years of age), is one component of a comprehensive surveillance program.  Test performance has been validated by [facility] for patients greater than or equal to 66 year old. It is not intended to diagnose infection nor to guide or monitor treatment.         Review of Systems  Constitutional: Positive for weight gain.       She has difficulty losing weight and has gained about 10 pounds in the last year or so  HENT: Negative for headaches.        Has  prior history of migraines  Respiratory: Negative for shortness of breath.   Cardiovascular: Negative for leg swelling.  Gastrointestinal: Positive for constipation.       Mild constipation present  Endocrine: Positive for fatigue. Negative for cold intolerance.       Years  Musculoskeletal:       She has had problems with spinal stenosis in her lumbar spine and now his having issues with cervical spine, pending surgery.  Also has some nonspecific joint pains  Skin: Positive for dry skin.       She has had problems with mild chronic hair loss  Neurological:       Some tingling in her fingers present     PHYSICAL EXAM:  BP 110/70   Pulse 72   Ht 5' 4.37" (1.635 m)   Wt 167 lb 6.4 oz (75.9 kg)   LMP  (LMP Unknown)   SpO2 96%   BMI 28.40 kg/m   GENERAL:  Mild generalized obesity present.  No cushingoid features  No pallor, clubbing  Skin:  no rash or pigmentation. No evident alopecia  EYES:  Externally normal.  Fundus exam not indicated  ENT: Oral mucosa and tongue normal.  THYROID:  Not palpable. Neck exam shows no lymphadenopathy  HEART:  Normal  S1 and S2; no murmur or click.  CHEST:  Normal shape.  Lungs: Vescicular breath sounds heard equally.  No crepitations/ wheeze.  ABDOMEN:  No distention.  Liver and spleen not palpable.  No other mass or tenderness.  NEUROLOGICAL: .Reflexes are appearing normal at biceps  JOINTS:  Normal.   ASSESSMENT:   Chronic fatigue, long-standing and nonspecific weight gain.  Does not have any specific symptoms of hypothyroidism.  No finding of goiter.  Appears to have had normal TSH levels consistently from PCP in the past. Discussed with the patient that she may possibly have secondary hypothyroidism since her total T4 level is low normal but this has not been confirmed  Weight gain and difficulty losing weight: This may or may not be related to hypothyroidism but may potentially improve she has less fatigue and is able to  exercise more  Other prior problems include cervical and lumbar spinal stenosis, hyperlipidemia, insomnia     PLAN:    Check free T4 and free T3 levels and decide on further management  To rule out panhypopituitarism will also check an [place] level   Consultation note sent to the referring physician  [facility] 06/16/2016, 1:59 PM   Addendum: Her free T4 is low normal at 0.67.  Will give her a therapeutic trial of levothyroxine 25 g daily and follow-up in 6 weeks

## 2016-06-18 ENCOUNTER — Encounter: Payer: Self-pay | Admitting: Endocrinology

## 2016-06-21 NOTE — Pre-Procedure Instructions (Signed)
Courtney [name]  06/21/2016      Courtney [name] [facility] [address] [place] [address] Phone: [phone] Fax: [phone]  Courtney [name], [place] [address] Phone: [phone] Fax: [phone]  [facility] [place], [place] [address] [place] [address] Phone: [phone] Fax: [phone]  [facility] [place], [place] [address] [place] [address] Phone: [phone] Fax: [phone]    Your procedure is scheduled on January 17  Report to [facility] at [employer] A.M.  Call this number if you have problems the morning of surgery:  [phone]   Remember:  Do not eat food or drink liquids after midnight.   Take these medicines the morning of surgery with A SIP OF WATER DULoxetine (CYMBALTA), fluticasone (FLONASE),  loratadine (CLARITIN), omeprazole (PRILOSEC), oxycodone (OXY-IR),  pregabalin (LYRICA)  7 days prior to surgery STOP taking any Aspirin, Aleve, Naproxen, Ibuprofen, Motrin, Advil, Goody's, BC's, all herbal medications, fish oil, and all vitamins    Do not wear jewelry, make-up or nail polish.  Do not wear lotions, powders, or perfumes, or deoderant.  Do not shave 48 hours prior to surgery.  Do not bring valuables to the hospital.  [facility] is not responsible for any belongings or valuables.  Contacts, dentures or bridgework may not be worn into surgery.  Leave your suitcase in the car.  After surgery it may be brought to your room.  For patients admitted to the hospital, discharge time will be determined by your treatment team.  Patients discharged the day of surgery will not be allowed to drive home.    Special instructions:   [facility]- Preparing For  Surgery  Before surgery, you can play an important role. Because skin is not sterile, your skin needs to be as free of germs as possible. You can reduce the number of germs on your skin by washing with CHG (chlorahexidine gluconate) Soap before surgery.  CHG is an antiseptic cleaner which kills germs and bonds with the skin to continue killing germs even after washing.  Please do not use if you have an allergy to CHG or antibacterial soaps. If your skin becomes reddened/irritated stop using the CHG.  Do not shave (including legs and underarms) for at least 48 hours prior to first CHG shower. It is OK to shave your face.  Please follow these instructions carefully.   1. Shower the NIGHT BEFORE SURGERY and the MORNING OF SURGERY with CHG.   2. If you chose to wash your hair, wash your hair first as usual with your normal shampoo.  3. After you shampoo, rinse your hair and body thoroughly to remove the shampoo.  4. Use CHG as you would any other liquid soap. You can apply CHG directly to the skin and wash gently with a scrungie or a clean washcloth.   5. Apply the CHG Soap to your body ONLY FROM THE NECK DOWN.  Do not use on open wounds or open sores. Avoid contact with your eyes, ears, mouth and genitals (private parts). Wash genitals (private parts) with your normal soap.  6. Wash thoroughly, paying special attention to the area where your surgery will be performed.  7. Thoroughly rinse  your body with warm water from the neck down.  8. DO NOT shower/wash with your normal soap after using and rinsing off the CHG Soap.  9. Pat yourself dry with a CLEAN TOWEL.   10. Wear CLEAN PAJAMAS   11. Place CLEAN SHEETS on your bed the night of your first shower and DO NOT SLEEP WITH PETS.    Day of Surgery: Do not apply any deodorants/lotions. Please wear clean clothes to the hospital/surgery center.      Please read over the following fact sheets that you were given.

## 2016-06-22 ENCOUNTER — Encounter (HOSPITAL_COMMUNITY): Payer: Self-pay

## 2016-06-22 ENCOUNTER — Other Ambulatory Visit: Payer: Self-pay

## 2016-06-22 ENCOUNTER — Telehealth: Payer: Self-pay | Admitting: Endocrinology

## 2016-06-22 ENCOUNTER — Encounter (HOSPITAL_COMMUNITY)
Admission: RE | Admit: 2016-06-22 | Discharge: 2016-06-22 | Disposition: A | Discharge status: Home / Self Care | Payer: Medicare Other | Source: Ambulatory Visit | Attending: Orthopedic Surgery | Admitting: Orthopedic Surgery

## 2016-06-22 DIAGNOSIS — Z01812 Encounter for preprocedural laboratory examination: Secondary | ICD-10-CM | POA: Diagnosis not present

## 2016-06-22 DIAGNOSIS — M21371 Foot drop, right foot: Secondary | ICD-10-CM | POA: Diagnosis not present

## 2016-06-22 DIAGNOSIS — E785 Hyperlipidemia, unspecified: Secondary | ICD-10-CM | POA: Insufficient documentation

## 2016-06-22 DIAGNOSIS — M545 Low back pain: Secondary | ICD-10-CM | POA: Diagnosis not present

## 2016-06-22 HISTORY — DX: Gastro-esophageal reflux disease without esophagitis: K21.9

## 2016-06-22 HISTORY — DX: Adverse effect of unspecified anesthetic, initial encounter: T41.45XA

## 2016-06-22 HISTORY — DX: Congenital insufficiency of aortic valve: Q23.1

## 2016-06-22 HISTORY — DX: Family history of other specified conditions: Z84.89

## 2016-06-22 HISTORY — DX: Other complications of anesthesia, initial encounter: T88.59XA

## 2016-06-22 HISTORY — DX: Bicuspid aortic valve: Q23.81

## 2016-06-22 HISTORY — DX: Sleep apnea, unspecified: G47.30

## 2016-06-22 LAB — CBC
HCT: 36.6 % (ref 36.0–46.0)
HEMOGLOBIN: 12.4 g/dL (ref 12.0–15.0)
MCH: 31.2 pg (ref 26.0–34.0)
MCHC: 33.9 g/dL (ref 30.0–36.0)
MCV: 92 fL (ref 78.0–100.0)
Platelets: 179 10*3/uL (ref 150–400)
RBC: 3.98 MIL/uL (ref 3.87–5.11)
RDW: 12.2 % (ref 11.5–15.5)
WBC: 5.7 10*3/uL (ref 4.0–10.5)

## 2016-06-22 LAB — BASIC METABOLIC PANEL
ANION GAP: 9 (ref 5–15)
BUN: 15 mg/dL (ref 6–20)
CALCIUM: 9.6 mg/dL (ref 8.9–10.3)
CO2: 25 mmol/L (ref 22–32)
Chloride: 105 mmol/L (ref 101–111)
Creatinine, Ser: 0.79 mg/dL (ref 0.44–1.00)
GFR calc Af Amer: >60 mL/min (ref >60)
Glucose, Bld: 100 mg/dL — ABNORMAL HIGH (ref 65–99)
Potassium: 4.2 mmol/L (ref 3.5–5.1)
SODIUM: 139 mmol/L (ref 135–145)

## 2016-06-22 LAB — SURGICAL PCR SCREEN
MRSA, PCR: NEGATIVE
STAPHYLOCOCCUS AUREUS: NEGATIVE

## 2016-06-22 MED ORDER — LEVOTHYROXINE SODIUM 25 MCG PO TABS: 25.0000 ug | ORAL_TABLET | Freq: Every day | ORAL | 5 refills | Status: DC

## 2016-06-22 NOTE — Telephone Encounter (Signed)
Her T4 and T3 levels are low normal, start levothyroxine 25 g daily as a trial and schedule patient for office visit in 6 weeks, please order labs for free T4 and free T3 prior to visit  Pt is aware of the above message, please call in the requested levothyroxine rx to [facility] [address]

## 2016-06-22 NOTE — Progress Notes (Signed)
PCP - Kelton Pillar Cardiologist - Dr. Georgina Peer before last surgery for clearance because she did not have a PCP at the time  Chest x-ray - 05/23/16 EKG - 06/22/16 Stress Test - denies ECHO - 02/10/15 Cardiac Cath - denies Sleep Study - 2005 at [facility]   Fasting Blood Sugar - n/a Checks Blood Sugar _n/a____ times a day  CPAP - she has a CPAP at home but chooses not to wear it  Patient denies shortness of breath, fever, cough and chest pain at PAT appointment   Patient verbalized understanding of instructions that was given to them at the PAT appointment. Patient expressed that there were no further questions.  Patient was also instructed that they will need to review over the PAT instructions again at home before the surgery.

## 2016-06-22 NOTE — Telephone Encounter (Signed)
Refill submitted. 

## 2016-06-23 ENCOUNTER — Encounter (HOSPITAL_COMMUNITY): Payer: Self-pay

## 2016-06-23 DIAGNOSIS — M4802 Spinal stenosis, cervical region: Secondary | ICD-10-CM | POA: Diagnosis not present

## 2016-06-23 NOTE — Progress Notes (Signed)
Anesthesia Chart Review:  Pt is 66 year old female scheduled for C5-7 ACDF on 06/28/16 with Dr. Rolena Infante.   PMH includes: hyperlipidemia, bicuspid aortic valve (no AS or AR by 01/2015 echo), depression, HLD, fibromyalgia, GERD, OSA (does not wear CPAP), tonsillectomy, parathyroidectomy '12, L3-4 decompression 04/21/15. Never smoker. Reports she is sensitive to anesthesia. BMI 28.   PCP is listed as Dr. Kelton Pillar. She is not routinely followed by a cardiologist, but she saw Dr. Shelva Majestic on8/17/16 for preoperative evaluation for findings of a mild murmur that showed trivial MR and trivial PR and a bicuspid AV without stenosis or regurgitation.   Medicationsm include: ASA, Lipitor, Cymbalta, Flonase, Lidoderm, Claritin, Omega 3, Prilosec,Oxy-IR, Lyrica, trazodone, vitamin E. (Endocrinologist Dr. Dwyane Dee is starting her on levothyroxine 25 mcg 06/22/16 for low normal T4.)   EKG 06/22/16: NSR, non-specific ST and T wave abnormality.    Echo 02/10/15: - Left ventricle: The cavity size was normal. Wall thickness was normal. Systolic function was normal. The estimated ejection fraction was in the range of 55% to 60%. Wall motion was normal; there were no regional wall motion abnormalities. - Aortic valve: Bicuspid; mildly thickened leaflets. Transvalvular velocity was within the normal range. There was no stenosis. There was no significant regurgitation.   CXR 05/23/16: IMPRESSION: No active cardiopulmonary disease.  Preoperative labs noted.  If no acute changes then I would anticipate pt can proceed with surgery as scheduled.   George Hugh [facility] Short Stay Center/Anesthesiology Phone [phone] 06/23/2016 1:48 PM

## 2016-06-27 DIAGNOSIS — M21371 Foot drop, right foot: Secondary | ICD-10-CM | POA: Diagnosis not present

## 2016-06-27 DIAGNOSIS — M545 Low back pain: Secondary | ICD-10-CM | POA: Diagnosis not present

## 2016-06-28 ENCOUNTER — Inpatient Hospital Stay (HOSPITAL_COMMUNITY): Payer: Medicare Other | Admitting: Anesthesiology

## 2016-06-28 ENCOUNTER — Encounter (HOSPITAL_COMMUNITY)
Admission: RE | Disposition: A | Discharge status: Home / Self Care | Payer: Self-pay | Source: Ambulatory Visit | Attending: Orthopedic Surgery

## 2016-06-28 ENCOUNTER — Inpatient Hospital Stay (HOSPITAL_COMMUNITY): Payer: Medicare Other

## 2016-06-28 ENCOUNTER — Inpatient Hospital Stay (HOSPITAL_COMMUNITY): Payer: Medicare Other | Admitting: Vascular Surgery

## 2016-06-28 ENCOUNTER — Observation Stay (HOSPITAL_COMMUNITY)
Admission: RE | Admit: 2016-06-28 | Discharge: 2016-06-29 | Disposition: A | Discharge status: Home / Self Care | Payer: Medicare Other | Source: Ambulatory Visit | Attending: Orthopedic Surgery | Admitting: Orthopedic Surgery

## 2016-06-28 DIAGNOSIS — M50023 Cervical disc disorder at C6-C7 level with myelopathy: Secondary | ICD-10-CM | POA: Diagnosis not present

## 2016-06-28 DIAGNOSIS — E785 Hyperlipidemia, unspecified: Secondary | ICD-10-CM | POA: Insufficient documentation

## 2016-06-28 DIAGNOSIS — M4802 Spinal stenosis, cervical region: Secondary | ICD-10-CM | POA: Diagnosis not present

## 2016-06-28 DIAGNOSIS — F329 Major depressive disorder, single episode, unspecified: Secondary | ICD-10-CM | POA: Insufficient documentation

## 2016-06-28 DIAGNOSIS — M797 Fibromyalgia: Secondary | ICD-10-CM | POA: Diagnosis not present

## 2016-06-28 DIAGNOSIS — G4730 Sleep apnea, unspecified: Secondary | ICD-10-CM | POA: Diagnosis not present

## 2016-06-28 DIAGNOSIS — M50322 Other cervical disc degeneration at C5-C6 level: Secondary | ICD-10-CM | POA: Diagnosis not present

## 2016-06-28 DIAGNOSIS — Z7982 Long term (current) use of aspirin: Secondary | ICD-10-CM | POA: Insufficient documentation

## 2016-06-28 DIAGNOSIS — M4322 Fusion of spine, cervical region: Secondary | ICD-10-CM | POA: Diagnosis not present

## 2016-06-28 DIAGNOSIS — M50022 Cervical disc disorder at C5-C6 level with myelopathy: Secondary | ICD-10-CM | POA: Diagnosis not present

## 2016-06-28 DIAGNOSIS — K219 Gastro-esophageal reflux disease without esophagitis: Secondary | ICD-10-CM | POA: Insufficient documentation

## 2016-06-28 DIAGNOSIS — Z79899 Other long term (current) drug therapy: Secondary | ICD-10-CM | POA: Diagnosis not present

## 2016-06-28 DIAGNOSIS — M50223 Other cervical disc displacement at C6-C7 level: Secondary | ICD-10-CM | POA: Diagnosis not present

## 2016-06-28 DIAGNOSIS — Z87891 Personal history of nicotine dependence: Secondary | ICD-10-CM | POA: Diagnosis not present

## 2016-06-28 DIAGNOSIS — Z419 Encounter for procedure for purposes other than remedying health state, unspecified: Secondary | ICD-10-CM

## 2016-06-28 DIAGNOSIS — M542 Cervicalgia: Secondary | ICD-10-CM | POA: Diagnosis present

## 2016-06-28 HISTORY — PX: ANTERIOR CERVICAL DECOMP/DISCECTOMY FUSION: SHX1161

## 2016-06-28 SURGERY — ANTERIOR CERVICAL DECOMPRESSION/DISCECTOMY FUSION 2 LEVELS
Anesthesia: General | Site: Neck

## 2016-06-28 MED ORDER — SODIUM CHLORIDE 0.9% FLUSH: 3.0000 mL | INTRAVENOUS | Status: DC | PRN

## 2016-06-28 MED ORDER — CEFAZOLIN SODIUM-DEXTROSE 2-4 GM/100ML-% IV SOLN
2.0000 g | INTRAVENOUS | Status: AC
  Administered 2016-06-28 (×2): 2 g via INTRAVENOUS
  Filled 2016-06-28: qty 100

## 2016-06-28 MED ORDER — PROPOFOL 10 MG/ML IV BOLUS
INTRAVENOUS | Status: DC | PRN
  Administered 2016-06-28: 150 mg via INTRAVENOUS

## 2016-06-28 MED ORDER — SODIUM CHLORIDE 0.9% FLUSH: 3.0000 mL | Freq: Two times a day (BID) | INTRAVENOUS | Status: DC

## 2016-06-28 MED ORDER — OXYCODONE HCL 5 MG PO TABS
ORAL_TABLET | ORAL | Status: AC
  Filled 2016-06-28: qty 2

## 2016-06-28 MED ORDER — PHENYLEPHRINE 40 MCG/ML (10ML) SYRINGE FOR IV PUSH (FOR BLOOD PRESSURE SUPPORT)
PREFILLED_SYRINGE | INTRAVENOUS | Status: AC
  Filled 2016-06-28: qty 10

## 2016-06-28 MED ORDER — ARTIFICIAL TEARS OP OINT
TOPICAL_OINTMENT | OPHTHALMIC | Status: DC | PRN
  Administered 2016-06-28: 1 via OPHTHALMIC

## 2016-06-28 MED ORDER — DULOXETINE HCL 30 MG PO CPEP
60.0000 mg | ORAL_CAPSULE | Freq: Every day | ORAL | Status: DC
  Administered 2016-06-29: 60 mg via ORAL
  Filled 2016-06-28 (×2): qty 2

## 2016-06-28 MED ORDER — PHENOL 1.4 % MT LIQD: 1.0000 | OROMUCOSAL | Status: DC | PRN

## 2016-06-28 MED ORDER — OXYCODONE-ACETAMINOPHEN 10-325 MG PO TABS: 1.0000 | ORAL_TABLET | ORAL | 0 refills | Status: DC | PRN

## 2016-06-28 MED ORDER — LACTATED RINGERS IV SOLN
INTRAVENOUS | Status: DC | PRN
  Administered 2016-06-28 (×2): via INTRAVENOUS

## 2016-06-28 MED ORDER — METHOCARBAMOL 500 MG PO TABS
500.0000 mg | ORAL_TABLET | Freq: Four times a day (QID) | ORAL | Status: DC | PRN
  Administered 2016-06-28 – 2016-06-29 (×3): 500 mg via ORAL
  Filled 2016-06-28 (×2): qty 1

## 2016-06-28 MED ORDER — ARTIFICIAL TEARS OP OINT
TOPICAL_OINTMENT | OPHTHALMIC | Status: AC
  Filled 2016-06-28: qty 14

## 2016-06-28 MED ORDER — PHENYLEPHRINE HCL 10 MG/ML IJ SOLN
INTRAVENOUS | Status: DC | PRN
  Administered 2016-06-28: 25 ug/min via INTRAVENOUS

## 2016-06-28 MED ORDER — DEXAMETHASONE SODIUM PHOSPHATE 10 MG/ML IJ SOLN
INTRAMUSCULAR | Status: DC | PRN
  Administered 2016-06-28: 10 mg via INTRAVENOUS

## 2016-06-28 MED ORDER — ACETAMINOPHEN 650 MG RE SUPP: 650.0000 mg | RECTAL | Status: DC | PRN

## 2016-06-28 MED ORDER — BUPIVACAINE HCL (PF) 0.25 % IJ SOLN
INTRAMUSCULAR | Status: AC
  Filled 2016-06-28: qty 30

## 2016-06-28 MED ORDER — METHOCARBAMOL 1000 MG/10ML IJ SOLN
500.0000 mg | Freq: Four times a day (QID) | INTRAVENOUS | Status: DC | PRN
  Filled 2016-06-28: qty 5

## 2016-06-28 MED ORDER — POLYETHYLENE GLYCOL 3350 17 G PO PACK: 17.0000 g | PACK | Freq: Every day | ORAL | Status: DC | PRN

## 2016-06-28 MED ORDER — CEFAZOLIN SODIUM 1 G IJ SOLR
INTRAMUSCULAR | Status: AC
  Filled 2016-06-28: qty 20

## 2016-06-28 MED ORDER — MIDAZOLAM HCL 2 MG/2ML IJ SOLN
INTRAMUSCULAR | Status: AC
  Filled 2016-06-28: qty 2

## 2016-06-28 MED ORDER — METHOCARBAMOL 500 MG PO TABS: 500.0000 mg | ORAL_TABLET | Freq: Three times a day (TID) | ORAL | 0 refills | Status: DC | PRN

## 2016-06-28 MED ORDER — LEVOTHYROXINE SODIUM 25 MCG PO TABS
25.0000 ug | ORAL_TABLET | Freq: Every day | ORAL | Status: DC
  Administered 2016-06-29: 25 ug via ORAL
  Filled 2016-06-28: qty 1

## 2016-06-28 MED ORDER — BUPIVACAINE HCL (PF) 0.25 % IJ SOLN
INTRAMUSCULAR | Status: DC | PRN
  Administered 2016-06-28: 6 mL

## 2016-06-28 MED ORDER — LIDOCAINE 2% (20 MG/ML) 5 ML SYRINGE
INTRAMUSCULAR | Status: AC
  Filled 2016-06-28: qty 10

## 2016-06-28 MED ORDER — MORPHINE SULFATE (PF) 2 MG/ML IV SOLN: 2.0000 mg | INTRAVENOUS | Status: DC | PRN

## 2016-06-28 MED ORDER — ARTIFICIAL TEARS OP OINT
TOPICAL_OINTMENT | OPHTHALMIC | Status: AC
Start: 2016-06-28 — End: 2016-06-28
  Filled 2016-06-28: qty 3.5

## 2016-06-28 MED ORDER — MENTHOL 3 MG MT LOZG: 1.0000 | LOZENGE | OROMUCOSAL | Status: DC | PRN

## 2016-06-28 MED ORDER — DEXAMETHASONE SODIUM PHOSPHATE 10 MG/ML IJ SOLN
INTRAMUSCULAR | Status: AC
  Filled 2016-06-28: qty 2

## 2016-06-28 MED ORDER — SUGAMMADEX SODIUM 200 MG/2ML IV SOLN
INTRAVENOUS | Status: DC | PRN
  Administered 2016-06-28: 200 mg via INTRAVENOUS

## 2016-06-28 MED ORDER — OXYCODONE-ACETAMINOPHEN 5-325 MG PO TABS: 1.0000 | ORAL_TABLET | ORAL | Status: DC | PRN

## 2016-06-28 MED ORDER — METHOCARBAMOL 500 MG PO TABS
ORAL_TABLET | ORAL | Status: AC
  Filled 2016-06-28: qty 1

## 2016-06-28 MED ORDER — FENTANYL CITRATE (PF) 100 MCG/2ML IJ SOLN
INTRAMUSCULAR | Status: AC
  Filled 2016-06-28: qty 4

## 2016-06-28 MED ORDER — PROPOFOL 10 MG/ML IV BOLUS
INTRAVENOUS | Status: AC
  Filled 2016-06-28: qty 40

## 2016-06-28 MED ORDER — OXYCODONE HCL 5 MG PO TABS
10.0000 mg | ORAL_TABLET | ORAL | Status: DC | PRN
  Administered 2016-06-28 – 2016-06-29 (×5): 10 mg via ORAL
  Filled 2016-06-28 (×4): qty 2

## 2016-06-28 MED ORDER — ACETAMINOPHEN 10 MG/ML IV SOLN
INTRAVENOUS | Status: AC
  Filled 2016-06-28: qty 100

## 2016-06-28 MED ORDER — ACETAMINOPHEN 10 MG/ML IV SOLN
INTRAVENOUS | Status: DC | PRN
  Administered 2016-06-28: 1000 mg via INTRAVENOUS

## 2016-06-28 MED ORDER — SUGAMMADEX SODIUM 200 MG/2ML IV SOLN
INTRAVENOUS | Status: AC
  Filled 2016-06-28: qty 4

## 2016-06-28 MED ORDER — PROMETHAZINE HCL 25 MG/ML IJ SOLN: 6.2500 mg | INTRAMUSCULAR | Status: DC | PRN

## 2016-06-28 MED ORDER — THROMBIN 20000 UNITS EX KIT
PACK | CUTANEOUS | Status: DC | PRN
  Administered 2016-06-28: 20000 [IU] via TOPICAL

## 2016-06-28 MED ORDER — ONDANSETRON HCL 4 MG/2ML IJ SOLN
INTRAMUSCULAR | Status: DC | PRN
Start: 2016-06-28 — End: 2016-06-28
  Administered 2016-06-28: 4 mg via INTRAVENOUS

## 2016-06-28 MED ORDER — HEMOSTATIC AGENTS (NO CHARGE) OPTIME
TOPICAL | Status: DC | PRN
  Administered 2016-06-28 (×2): 1 via TOPICAL

## 2016-06-28 MED ORDER — ONDANSETRON HCL 4 MG/2ML IJ SOLN: 4.0000 mg | INTRAMUSCULAR | Status: DC | PRN

## 2016-06-28 MED ORDER — FENTANYL CITRATE (PF) 100 MCG/2ML IJ SOLN
INTRAMUSCULAR | Status: AC
  Filled 2016-06-28: qty 2

## 2016-06-28 MED ORDER — ROCURONIUM BROMIDE 50 MG/5ML IV SOSY
PREFILLED_SYRINGE | INTRAVENOUS | Status: AC
  Filled 2016-06-28: qty 15

## 2016-06-28 MED ORDER — HYDROMORPHONE HCL 1 MG/ML IJ SOLN
INTRAMUSCULAR | Status: AC
  Filled 2016-06-28: qty 0.5

## 2016-06-28 MED ORDER — ONDANSETRON HCL 4 MG/2ML IJ SOLN
INTRAMUSCULAR | Status: AC
Start: 2016-06-28 — End: 2016-06-28
  Filled 2016-06-28: qty 4

## 2016-06-28 MED ORDER — HYDROMORPHONE HCL 1 MG/ML IJ SOLN
0.2500 mg | INTRAMUSCULAR | Status: DC | PRN
  Administered 2016-06-28: 0.5 mg via INTRAVENOUS

## 2016-06-28 MED ORDER — ACETAMINOPHEN 325 MG PO TABS: 650.0000 mg | ORAL_TABLET | ORAL | Status: DC | PRN

## 2016-06-28 MED ORDER — EPINEPHRINE PF 1 MG/10ML IJ SOSY
PREFILLED_SYRINGE | INTRAMUSCULAR | Status: DC | PRN
  Administered 2016-06-28: .15 mL via INTRAMUSCULAR

## 2016-06-28 MED ORDER — THROMBIN 20000 UNITS EX SOLR
CUTANEOUS | Status: AC
  Filled 2016-06-28: qty 20000

## 2016-06-28 MED ORDER — LACTATED RINGERS IV SOLN: INTRAVENOUS | Status: DC

## 2016-06-28 MED ORDER — ONDANSETRON HCL 4 MG PO TABS: 4.0000 mg | ORAL_TABLET | Freq: Three times a day (TID) | ORAL | 0 refills | Status: DC | PRN

## 2016-06-28 MED ORDER — EPINEPHRINE PF 1 MG/ML IJ SOLN
INTRAMUSCULAR | Status: AC
  Filled 2016-06-28: qty 1

## 2016-06-28 MED ORDER — LIDOCAINE HCL (CARDIAC) 20 MG/ML IV SOLN
INTRAVENOUS | Status: DC | PRN
  Administered 2016-06-28: 50 mg via INTRAVENOUS

## 2016-06-28 MED ORDER — FENTANYL CITRATE (PF) 100 MCG/2ML IJ SOLN
INTRAMUSCULAR | Status: DC | PRN
  Administered 2016-06-28 (×2): 50 ug via INTRAVENOUS
  Administered 2016-06-28 (×2): 25 ug via INTRAVENOUS
  Administered 2016-06-28: 50 ug via INTRAVENOUS
  Administered 2016-06-28: 100 ug via INTRAVENOUS

## 2016-06-28 MED ORDER — DEXAMETHASONE SODIUM PHOSPHATE 4 MG/ML IJ SOLN: 4.0000 mg | Freq: Four times a day (QID) | INTRAMUSCULAR | Status: AC

## 2016-06-28 MED ORDER — DEXAMETHASONE 4 MG PO TABS
4.0000 mg | ORAL_TABLET | Freq: Four times a day (QID) | ORAL | Status: AC
  Administered 2016-06-28 – 2016-06-29 (×3): 4 mg via ORAL
  Filled 2016-06-28 (×3): qty 1

## 2016-06-28 MED ORDER — ROCURONIUM BROMIDE 100 MG/10ML IV SOLN
INTRAVENOUS | Status: DC | PRN
  Administered 2016-06-28 (×3): 10 mg via INTRAVENOUS
  Administered 2016-06-28: 50 mg via INTRAVENOUS
  Administered 2016-06-28: 10 mg via INTRAVENOUS

## 2016-06-28 MED ORDER — 0.9 % SODIUM CHLORIDE (POUR BTL) OPTIME
TOPICAL | Status: DC | PRN
  Administered 2016-06-28: 1000 mL

## 2016-06-28 MED ORDER — CEFAZOLIN SODIUM-DEXTROSE 2-4 GM/100ML-% IV SOLN
2.0000 g | Freq: Three times a day (TID) | INTRAVENOUS | Status: AC
  Administered 2016-06-28 – 2016-06-29 (×2): 2 g via INTRAVENOUS
  Filled 2016-06-28 (×2): qty 100

## 2016-06-28 SURGICAL SUPPLY — 70 items
BIT DRILL SKYLINE 12MM (BIT) ×1 IMPLANT
BLADE SURG ROTATE 9660 (MISCELLANEOUS) IMPLANT
BONE VIVIGEN FORMABLE 5.4CC (Bone Implant) ×3 IMPLANT
BUR EGG ELITE 4.0 (BURR) IMPLANT
BUR EGG ELITE 4.0MM (BURR)
BUR MATCHSTICK NEURO 3.0 LAGG (BURR) IMPLANT
CAGE LORDOTIC 6 SM (Cage) ×2 IMPLANT
CAGE LORDOTIC 6MM SM (Cage) ×1 IMPLANT
CANISTER SUCTION 2500CC (MISCELLANEOUS) ×3 IMPLANT
CLOSURE STERI-STRIP 1/2X4 (GAUZE/BANDAGES/DRESSINGS) ×1
CLOSURE WOUND 1/2 X4 (GAUZE/BANDAGES/DRESSINGS) ×1
CLSR STERI-STRIP ANTIMIC 1/2X4 (GAUZE/BANDAGES/DRESSINGS) ×2 IMPLANT
CORDS BIPOLAR (ELECTRODE) ×6 IMPLANT
COVER SURGICAL LIGHT HANDLE (MISCELLANEOUS) ×6 IMPLANT
CRADLE DONUT ADULT HEAD (MISCELLANEOUS) ×3 IMPLANT
DEVICE ENDSKLTN IMPLANT SM 7MM (Cage) ×2 IMPLANT
DRAPE C-ARM 42X72 X-RAY (DRAPES) ×3 IMPLANT
DRAPE MICROSCOPE LEICA (MISCELLANEOUS) ×3 IMPLANT
DRAPE POUCH INSTRU U-SHP 10X18 (DRAPES) ×3 IMPLANT
DRAPE SURG 17X23 STRL (DRAPES) ×3 IMPLANT
DRAPE U-SHAPE 47X51 STRL (DRAPES) ×3 IMPLANT
DRILL BIT SKYLINE 12MM (BIT) ×2
DRSG MEPILEX BORDER 4X4 (GAUZE/BANDAGES/DRESSINGS) ×3 IMPLANT
DURAPREP 6ML APPLICATOR 50/CS (WOUND CARE) ×3 IMPLANT
ELECT COATED BLADE 2.86 ST (ELECTRODE) ×3 IMPLANT
ELECT PENCIL ROCKER SW 15FT (MISCELLANEOUS) ×6 IMPLANT
ELECT REM PT RETURN 9FT ADLT (ELECTROSURGICAL) ×3
ELECTRODE REM PT RTRN 9FT ADLT (ELECTROSURGICAL) ×1 IMPLANT
ENDOSKELETON IMPLANT SM 7MM (Cage) ×6 IMPLANT
GLOVE BIO SURGEON STRL SZ 6.5 (GLOVE) ×2 IMPLANT
GLOVE BIO SURGEONS STRL SZ 6.5 (GLOVE) ×1
GLOVE BIOGEL PI IND STRL 6.5 (GLOVE) ×1 IMPLANT
GLOVE BIOGEL PI IND STRL 8.5 (GLOVE) ×1 IMPLANT
GLOVE BIOGEL PI INDICATOR 6.5 (GLOVE) ×2
GLOVE BIOGEL PI INDICATOR 8.5 (GLOVE) ×2
GLOVE SS BIOGEL STRL SZ 8.5 (GLOVE) ×1 IMPLANT
GLOVE SUPERSENSE BIOGEL SZ 8.5 (GLOVE) ×2
GOWN STRL REUS W/ TWL XL LVL3 (GOWN DISPOSABLE) ×2 IMPLANT
GOWN STRL REUS W/TWL 2XL LVL3 (GOWN DISPOSABLE) ×6 IMPLANT
GOWN STRL REUS W/TWL XL LVL3 (GOWN DISPOSABLE) ×4
KIT BASIN OR (CUSTOM PROCEDURE TRAY) ×3 IMPLANT
KIT ROOM TURNOVER OR (KITS) ×3 IMPLANT
NEEDLE SPNL 18GX3.5 QUINCKE PK (NEEDLE) ×3 IMPLANT
NS IRRIG 1000ML POUR BTL (IV SOLUTION) ×3 IMPLANT
PACK ORTHO CERVICAL (CUSTOM PROCEDURE TRAY) ×3 IMPLANT
PACK UNIVERSAL I (CUSTOM PROCEDURE TRAY) ×3 IMPLANT
PAD ARMBOARD 7.5X6 YLW CONV (MISCELLANEOUS) ×6 IMPLANT
PATTIES SURGICAL .25X.25 (GAUZE/BANDAGES/DRESSINGS) ×3 IMPLANT
PIN DISTRACTION 14 (PIN) ×6 IMPLANT
PLATE SKYLINE TWO LEVEL 32MM (Plate) ×3 IMPLANT
RESTRAINT LIMB HOLDER UNIV (RESTRAINTS) ×3 IMPLANT
SCREW SKYLINE VAR OS 14MM (Screw) ×18 IMPLANT
SPONGE INTESTINAL PEANUT (DISPOSABLE) ×3 IMPLANT
SPONGE LAP 4X18 X RAY DECT (DISPOSABLE) ×6 IMPLANT
SPONGE SURGIFOAM ABS GEL 100 (HEMOSTASIS) ×3 IMPLANT
STRIP CLOSURE SKIN 1/2X4 (GAUZE/BANDAGES/DRESSINGS) ×2 IMPLANT
SURGIFLO W/THROMBIN 8M KIT (HEMOSTASIS) ×3 IMPLANT
SUT BONE WAX W31G (SUTURE) ×3 IMPLANT
SUT MON AB 3-0 SH 27 (SUTURE) ×2
SUT MON AB 3-0 SH27 (SUTURE) ×1 IMPLANT
SUT SILK 2 0 (SUTURE)
SUT SILK 2-0 18XBRD TIE 12 (SUTURE) IMPLANT
SUT VIC AB 2-0 CT1 18 (SUTURE) ×3 IMPLANT
SYR BULB IRRIGATION 50ML (SYRINGE) ×3 IMPLANT
SYR CONTROL 10ML LL (SYRINGE) ×3 IMPLANT
TAPE CLOTH 4X10 WHT NS (GAUZE/BANDAGES/DRESSINGS) ×3 IMPLANT
TAPE UMBILICAL COTTON 1/8X30 (MISCELLANEOUS) ×3 IMPLANT
TOWEL OR 17X24 6PK STRL BLUE (TOWEL DISPOSABLE) ×3 IMPLANT
TOWEL OR 17X26 10 PK STRL BLUE (TOWEL DISPOSABLE) ×3 IMPLANT
WATER STERILE IRR 1000ML POUR (IV SOLUTION) ×3 IMPLANT

## 2016-06-28 NOTE — Anesthesia Preprocedure Evaluation (Addendum)
Anesthesia Evaluation  Patient identified by MRN, date of birth, ID band Patient awake    Reviewed: Allergy & Precautions, NPO status , Patient's Chart, lab work & pertinent test results  History of Anesthesia Complications (+) PROLONGED EMERGENCE and history of anesthetic complications  Airway Mallampati: II  TM Distance: >3 FB Neck ROM: Full    Dental no notable dental hx. (+) Teeth Intact, Dental Advidsory Given   Pulmonary sleep apnea ,    Pulmonary exam normal breath sounds clear to auscultation       Cardiovascular Normal cardiovascular exam+ Valvular Problems/Murmurs AS  Rhythm:Regular Rate:Normal     Neuro/Psych negative neurological ROS  negative psych ROS   GI/Hepatic negative GI ROS, Neg liver ROS,   Endo/Other  negative endocrine ROS  Renal/GU negative Renal ROS  negative genitourinary   Musculoskeletal negative musculoskeletal ROS (+)   Abdominal   Peds negative pediatric ROS (+)  Hematology negative hematology ROS (+)   Anesthesia Other Findings   Reproductive/Obstetrics negative OB ROS                            Anesthesia Physical Anesthesia Plan  ASA: III  Anesthesia Plan: General   Post-op Pain Management:    Induction: Intravenous  Airway Management Planned: Oral ETT  Additional Equipment:   Intra-op Plan:   Post-operative Plan: Extubation in OR  Informed Consent: I have reviewed the patients History and Physical, chart, labs and discussed the procedure including the risks, benefits and alternatives for the proposed anesthesia with the patient or authorized representative who has indicated his/her understanding and acceptance.   Dental advisory given and Dental Advisory Given  Plan Discussed with: CRNA, Surgeon and Anesthesiologist  Anesthesia Plan Comments:        Anesthesia Quick Evaluation

## 2016-06-28 NOTE — Transfer of Care (Signed)
Immediate Anesthesia Transfer of Care Note  Patient: Courtney [name]  Procedure(s) Performed: Procedure(s) with comments: ACDF C5-7 ANTERIOR CERVICAL DECOMPRESSION/DISCECTOMY FUSION 2 LEVELS (N/A) - Requests 3 hrs  Patient Location: PACU  Anesthesia Type:General  Level of Consciousness: awake, alert  and oriented  Airway & Oxygen Therapy: Patient Spontanous Breathing and Patient connected to face mask oxygen  Post-op Assessment: Report given to RN, Post -op Vital signs reviewed and stable and Patient moving all extremities X 4  Post vital signs: Reviewed and stable  Last Vitals:  Vitals:   06/28/16 0649  BP: (!) 121/52  Pulse: 74  Resp: 18  Temp: 36.8 C    Last Pain:  Vitals:   06/28/16 0649  TempSrc: Oral         Complications: No apparent anesthesia complications

## 2016-06-28 NOTE — Anesthesia Procedure Notes (Signed)
Procedure Name: Intubation Date/Time: 06/28/2016 8:33 AM Performed by: Neldon Newport Pre-anesthesia Checklist: Timeout performed, Patient being monitored, Suction available, Patient identified and Emergency Drugs available Patient Re-evaluated:Patient Re-evaluated prior to inductionOxygen Delivery Method: Circle system utilized Preoxygenation: Pre-oxygenation with 100% oxygen Intubation Type: IV induction Ventilation: Mask ventilation without difficulty Laryngoscope Size: Mac and 3 Grade View: Grade I Tube type: Oral Tube size: 7.0 mm Number of attempts: 1 Placement Confirmation: breath sounds checked- equal and bilateral,  positive ETCO2 and ETT inserted through vocal cords under direct vision Secured at: 22 cm Tube secured with: Tape Dental Injury: Teeth and Oropharynx as per pre-operative assessment

## 2016-06-28 NOTE — Brief Op Note (Signed)
06/28/2016  12:57 PM  PATIENT:  Kristen Cardinal Hipp  66 y.o. female  PRE-OPERATIVE DIAGNOSIS:  C5-6 degenerative Disc Disease, C6-7 HNP  POST-OPERATIVE DIAGNOSIS:  C5-6 degenerative Disc Disease, C6-7 HNP  PROCEDURE:  Procedure(s) with comments: ACDF C5-7 ANTERIOR CERVICAL DECOMPRESSION/DISCECTOMY FUSION 2 LEVELS (N/A) - Requests 3 hrs  SURGEON:  Surgeon(s) and Role:    * Melina Schools, MD - Primary  PHYSICIAN ASSISTANT:   ASSISTANTS: Carmen Mayo   ANESTHESIA:   general  EBL:  Total I/O In: 1000 [I.V.:1000] Out: 150 [Blood:150]  BLOOD ADMINISTERED:none  DRAINS: none   LOCAL MEDICATIONS USED:  MARCAINE     SPECIMEN:  No Specimen  DISPOSITION OF SPECIMEN:  N/A  COUNTS:  YES  TOURNIQUET:  * No tourniquets in log *  DICTATION: .Other Dictation: Dictation Number [id number]  PLAN OF CARE: Admit for overnight observation  PATIENT DISPOSITION:  PACU - hemodynamically stable.

## 2016-06-28 NOTE — Discharge Instructions (Signed)

## 2016-06-28 NOTE — H&P (Signed)
History of Present Illness  The patient is a 66 year old female who comes in today for a preoperative History and Physical. The patient is scheduled for a ACDF C5-7 to be performed by Dr. Duane Lope D. Rolena Infante, MD at [facility] on 06/28/2016 . Please see the hospital record for complete dictated history and physical. The pt has a diagnosis of sleep apnea. She does not use a sleep apnea.  Problem List/Past Medical Cervical spinal stenosis (M48.02)  Degenerative cervical disc (M50.90)  Problems Reconciled   Allergies Fish Meal *CHEMICALS*  fish that swim Nuts  tree nuts and ground nuts (except almonds) Allergies Reconciled   Family History Cancer  mother Heart Disease  mother Osteoarthritis  mother Rheumatoid Arthritis  father Severe allergy  mother and father  Social History Tobacco use  former smoker Tobacco / smoke exposure  no Alcohol use  current drinker; drinks wine; only occasionally per week Drug/Alcohol Rehab (Previously)  no Marital status  married Children  2 Current work status  working full time Drug/Alcohol Rehab (Currently)  no Number of flights of stairs before winded  2-3 Living situation  live with spouse Pain Contract  no Exercise  Exercises rarely; does running / walking Illicit drug use  no  Medication History  OxyCODONE HCl (5MG  Tablet, 1 (one) Oral three times daily, as needed, Taken starting 06/16/2016) Active. Amitiza (24MCG Capsule, 1 (one) Capsule Oral two times daily, Taken starting 06/16/2016) Active. (Generic OK) Fish Oil (1000MG  Capsule, Oral) Active. (QD) Vitamin C (500MG  Capsule, Oral) Active. (QD) Vitamin D-1000 Max St (1000UNIT Tablet, Oral) Active. (QD) Vitamin E (1000UNIT Capsule, Oral) Active. (QOD) Calcium 600 (1500 (600 Ca)MG Tablet, Oral) Active. (QD) Omeprazole (20MG  Tablet DR, Oral) Active. DULoxetine HCl (30MG  Capsule DR Part, Oral) Active. Cipro (250MG  Tablet, Oral as needed) Active. Epi  E-Z Pen (1:1000 Device, Injection as needed) Active. Malarone (62.5-25MG  Tablet, Oral as needed) Active. Aspirin (81MG  Tablet, 1 (one) Oral) Active. Multiple Vitamin (1 (one) Oral) Active. Lyrica (100MG  Capsule, Oral) Active. (5 X DAY RX'D BY PAIN MANAGEMENT) Dexedrine (5MG  Tablet, Oral) Active. (PRN) Flonase Allergy Relief (50MCG/ACT Suspension, Nasal) Active. (QD) TraZODone HCl (50MG  Tablet, Oral) Active. (QHS) Pravastatin Sodium (80MG  Tablet, Oral) Active. (QD) Atorvastatin Calcium (40MG  Tablet, Oral) Active. Omeprazole (20MG  Capsule DR, Oral) Active. Levothyroxine Sodium (125MCG Tablet, Oral) Active. (qd) Medications Reconciled  Vitals 06/23/2016 9:34 AM Weight: 165 lb Height: 64in Body Surface Area: 1.8 m Body Mass Index: 28.32 kg/m  Temp.: 98.87F  Pulse: 75 (Regular)  BP: 101/56 (Sitting, Left Arm, Standard)  General General Appearance-Not in acute distress. Orientation-Oriented X3. Build & Nutrition-Well nourished and Well developed.  Integumentary General Characteristics Surgical Scars - no surgical scar evidence of previous cervical surgery. Cervical Spine-Skin examination of the cervical spine is without deformity, skin lesions, lacerations or abrasions.  Chest and Lung Exam Auscultation Breath sounds - Normal and Clear.  Cardiovascular Auscultation Rhythm - Regular rate and rhythm.  Peripheral Vascular Upper Extremity Palpation - Radial pulse - Bilateral - 2+.  Neurologic Sensation Upper Extremity - Bilateral - sensation is diminished in the upper extremity. Reflexes Biceps Reflex - Bilateral - 2+. Brachioradialis Reflex - Bilateral - 2+. Triceps Reflex - Bilateral - 2+. Hoffman's Sign - Bilateral - Hoffman's sign not present.  Musculoskeletal Spine/Ribs/Pelvis  Cervical Spine : Inspection and Palpation - Tenderness - right cervical paraspinals tender to palpation and left cervical paraspinals tender to  palpation. Strength and Tone: Strength - Deltoid - Bilateral - 5/5. Biceps - Bilateral -  5/5. Triceps - Bilateral - 4-/5. Wrist Extension - Bilateral - 5/5. Hand Grip - Bilateral - 5/5. Heel walk - Bilateral - able to heel walk without difficulty. Toe Walk - Bilateral - able to walk on toes without difficulty. Heel-Toe Walk - Bilateral - able to heel-toe walk without difficulty. ROM - Flexion - Moderately Decreased and painful. Extension - Moderately Decreased and painful. Left Lateral Flexion - Moderately Decreased and painful. Right Lateral Flexion - Moderately Decreased and painful. Left Rotation - Moderately Decreased and painful. Right Rotation - Moderately Decreased and painful. Pain - neither flexion or extension is more painful than the other. Cervical Spine - Special Testing - axial compression test negative, cross chest impingement test negative. Non-Anatomic Signs - No non-anatomic signs present. Upper Extremity Range of Motion - No truesholder pain with IR/ER of the shoulders.  The MRI is significant for progression of disc herniation at C6-C7 with a large central worth to the right disc herniation deformity of the cord. There was also severe narrowing of the foramen at C5-6. Mild-to-moderate disease at C3-4, C4-5 and C7-T1. No cord signal changes noted.  Assessment & Plan  Goal Of Surgery: Discussed that goal of surgery is to reduce pain and improve function and quality of life. Patient is aware that despite all appropriate treatment that there pain and function could be the same, worse, or different.  Anterior cervical fusion:Risks of surgery include, but are not limited to: Throat pain, swallowing difficulty, hoarseness or change in voice, death, stroke, paralysis, nerve root damage/injury, bleeding, blood clots, loss of bowel/bladder control, hardware failure, or mal-position, spinal fluid leak, adjacent segment disease, non-union, need for further surgery, ongoing or worse pain,  infection. Post-operative bleeding or swelling that could require emergent surgery.  At this point in time, the patient's degenerative disc disease at C5-6 as well as the large central disc herniation has progressed from her previous 2016 MRI. Given the progression of the disease, progression of the neurological deficits and the extremity pain and neck pain, I think it is reasonable to move forward with an ACDF. Although the 6-7 level is the worse, the likelihood of progression of disease at 5-6 is significantly higher and there is severe foraminal stenosis, so I have recommended doing both levels and she is in agreement. We reviewed the risks which include infection, bleeding, nerve damage, death, stroke, paralysis, failure to heal, need for further surgery, throat pain, swallowing difficulty, hoarseness in the voice, nonunion. We will plan on moving forward with two-level ACDF. After surgery, I will use an external bone stimulator for about 9 to 12 months to help encourage the fusion.

## 2016-06-29 ENCOUNTER — Encounter (HOSPITAL_COMMUNITY): Payer: Self-pay | Admitting: Orthopedic Surgery

## 2016-06-29 DIAGNOSIS — M50322 Other cervical disc degeneration at C5-C6 level: Secondary | ICD-10-CM | POA: Diagnosis not present

## 2016-06-29 DIAGNOSIS — M50223 Other cervical disc displacement at C6-C7 level: Secondary | ICD-10-CM | POA: Diagnosis not present

## 2016-06-29 DIAGNOSIS — K219 Gastro-esophageal reflux disease without esophagitis: Secondary | ICD-10-CM | POA: Diagnosis not present

## 2016-06-29 DIAGNOSIS — M797 Fibromyalgia: Secondary | ICD-10-CM | POA: Diagnosis not present

## 2016-06-29 DIAGNOSIS — F329 Major depressive disorder, single episode, unspecified: Secondary | ICD-10-CM | POA: Diagnosis not present

## 2016-06-29 DIAGNOSIS — E785 Hyperlipidemia, unspecified: Secondary | ICD-10-CM | POA: Diagnosis not present

## 2016-06-29 MED FILL — Thrombin For Soln 20000 Unit: CUTANEOUS | Qty: 1 | Status: AC

## 2016-06-29 NOTE — Progress Notes (Signed)
Occupational Therapy Evaluation Patient Details Name: Courtney [name] MRN: [id number] DOB: February 17, 1951 Today's Date: 06/29/2016    History of Present Illness Pt is a 66 y/o female who presents s/p C5-C6 ACDF on 06/28/16.   Clinical Impression   Completed all education regarding cervical precautions for ADL and functional mobility. Pt demonstrated understanding. Pt appropriate for DC home with intermittent S.    Follow Up Recommendations  No OT follow up;Supervision - Intermittent    Equipment Recommendations  None recommended by OT    Recommendations for Other Services       Precautions / Restrictions Precautions Precautions: Fall;Cervical Precaution Comments: Reviewed handout with pt and significant other Required Braces or Orthoses: Cervical Brace Cervical Brace: Hard collar Restrictions Weight Bearing Restrictions: No      Mobility Bed Mobility Overal bed mobility: Needs Assistance Bed Mobility: Rolling;Sidelying to Sit;Sit to Sidelying Rolling: Modified independent (Device/Increase time) Sidelying to sit: Supervision     Sit to sidelying: Supervision General bed mobility comments: Able to return demonstrate proper bed mobility techniques  Transfers Overall transfer level: Modified independent Equipment used: None Transfers: Sit to/from Stand                Balance Overall balance assessment: No apparent balance deficits (not formally assessed) Sitting-balance support: Feet supported;No upper extremity supported Sitting balance-Leahy Scale: Normal     Standing balance support: No upper extremity supported;During functional activity Standing balance-Leahy Scale: Fair                              ADL Overall ADL's : Needs assistance/impaired                                     Functional mobility during ADLs: Modified independent General ADL Comments: Pt states she dressed herself this am. Reviewed how to donn/doff  cervical brace adn adjust as needed. Recommend pt use button up shorts if possible to increase independence with dressing. Pt verbalized understnading. Also reivewed proper set up of items at home to increase adherance to cervical precautions. Completed educatioin regarding compensatory techniqeus for ADL, cervical precautions and reducing risk of falls at home. Pt has shower seat at home. Recommended pt use shower seat at this time. Pt also has reacher. Reviewed use of reacher for ADL if needed. Friend will assist wtih ADL as needed.      Vision     Perception     Praxis      Pertinent Vitals/Pain Pain Assessment: Faces Faces Pain Scale: Hurts a little bit Pain Location: Incision site Pain Descriptors / Indicators: Operative site guarding Pain Intervention(s): Limited activity within patient's tolerance     Hand Dominance Right   Extremity/Trunk Assessment Upper Extremity Assessment Upper Extremity Assessment: Overall WFL for tasks assessed   Lower Extremity Assessment Lower Extremity Assessment: Overall WFL for tasks assessed   Cervical / Trunk Assessment Cervical / Trunk Assessment: Other exceptions Cervical / Trunk Exceptions: s/p surgery   Communication Communication Communication: No difficulties   Cognition Arousal/Alertness: Awake/alert Behavior During Therapy: WFL for tasks assessed/performed Overall Cognitive Status: Within Functional Limits for tasks assessed                     General Comments       Exercises       Shoulder Instructions  Home Living Family/patient expects to be discharged to:: Private residence Living Arrangements: Alone Available Help at Discharge: Family;Available PRN/intermittently Type of Home: House Home Access: Stairs to enter [employer] of Steps: 3 Entrance Stairs-Rails: Right Home Layout: One level     Bathroom Shower/Tub: Walk-in shower;Tub/shower unit   Bathroom Toilet: Handicapped  height Bathroom Accessibility: Yes How Accessible: Accessible via walker Home Equipment: Shower seat          Prior Functioning/Environment Level of Independence: Independent                 OT Problem List: Decreased knowledge of use of DME or AE;Decreased safety awareness;Decreased knowledge of precautions;Pain   OT Treatment/Interventions:      OT Goals(Current goals can be found in the care plan section) Acute Rehab OT Goals Patient Stated Goal: Home today OT Goal Formulation: All assessment and education complete, DC therapy  OT Frequency:     Barriers to D/C:            Co-evaluation              End of Session Equipment Utilized During Treatment: Cervical collar Nurse Communication: Other (comment) (ready for DC)  Activity Tolerance: Patient tolerated treatment well Patient left: in bed;with call bell/phone within reach;with family/visitor present   Time: [id number] OT Time Calculation (min): 15 min Charges:  OT General Charges $OT Visit: 1 Procedure OT Evaluation $OT Eval Low Complexity: 1 Procedure G-Codes: OT G-codes **NOT FOR INPATIENT CLASS** Functional Assessment Tool Used: clinical judgement Functional Limitation: Self care Self Care Current Status [id number]): At least 1 percent but less than 20 percent impaired, limited or restricted Self Care Goal Status [id number]): At least 1 percent but less than 20 percent impaired, limited or restricted Self Care Discharge Status [phone]): At least 1 percent but less than 20 percent impaired, limited or restricted  [facility] 06/29/2016, 9:29 AM   [facility], OT/L  [phone] 06/29/2016

## 2016-06-29 NOTE — Op Note (Signed)
NAMEMarland Kitchen  Courtney, [name]             ACCOUNT NO.:  [id number]  MEDICAL RECORD NO.:  [id number]  LOCATION:                                 FACILITY:  PHYSICIAN:  Leiya Keesey D. Courtney [name], M.D. DATE OF BIRTH:  03-26-1951  DATE OF PROCEDURE:  06/28/2016 DATE OF DISCHARGE:                              OPERATIVE REPORT   PREOPERATIVE DIAGNOSES:  C6-7 disk herniation, degenerative disk disease C5-6.  POSTOPERATIVE DIAGNOSES:  C6-7 disk herniation, degenerative disk disease C5-6.  OPERATIVE PROCEDURE:  Anterior cervical diskectomy and fusion C5-C7.  FIRST ASSISTANT:  [place], [place].  COMPLICATIONS:  None.  CONDITION:  Stable.  IMPLANT SYSTEM USED:  Titan nanoLOCK size 7 small intervertebral cage packed with ViviGen with a 32-mm anterior cervical Skyline plate from [employer] with 14-mm locking screws.  HISTORY:  This is a very pleasant 66 year old woman with progressive neck pain and intermittent debilitating radicular arm pain.  Attempts at conservative management have failed to alleviate her symptoms, so we elected to proceed with the aforementioned surgery.  All appropriate risks, benefits, and alternatives were discussed with the patient and consent was obtained.  OPERATIVE NOTE:  The patient was brought to the operating room and placed supine on the operating table.  After successful induction of general anesthesia and endotracheal intubation, TEDs and SCDs were applied and the arms were tucked at the side, and the anterior cervical spine was prepped and draped in a standard fashion.  Time-out was taken confirming the patient, procedure, and all other pertinent important data.  Once that was completed, I used x-ray to identify the C6 vertebral body and marked the incision site.  The incision site was infiltrated with 0.25% Marcaine with epi.  A midline incision was made. Sharp dissection was carried down to the platysma.  The platysma was sharply incised and I carried out a standard  Smith-Robinson approach to cervical spine.  I sharply dissected in the deep cervical fascia keeping the cervical and mastoid esophagus and trachea to the right and the carotid sheath to the left.  I identified the omohyoid and sacrificed it for better visualization.  I then placed a retractor underneath the esophagus and used Kittner dissectors to completely remove the prevertebral fascia and exposed the anterior longitudinal ligament.  A needle was placed into the C5-6 disk space and an x-ray was taken confirming I was at the appropriate level.  I then mobilized the longus colli muscle using a bipolar electrocautery from the midbody of C5 to the midbody of C7 bilaterally.  I placed a self-retaining retractor blades from the Caspar retractors system underneath the longus colli muscle, deflated the endotracheal cuff, and expanded the retractor for visualization.  At this point, I had excellent visualization of the C6-7 disk space.  I then placed my distraction pins into the body of C6 and C7.  Gently distracted the space and then brought the microscope in. The operative microscope was then draped and brought into the field and an annulotomy was performed.  I then used pituitary rongeurs, curettes, and Kerrison rongeurs to resect all of the disk material and removed the inferior overhanging osteophyte from the inferior aspect of the C6 vertebral body.  I continued  posteriorly until I was at the posterior annulus.  I used my nerve hook to mobilize the large disk herniation fragment out and removed it with my 1-mm Kerrison.  I then used the 1-mm Kerrison to resect the remaining posterior longitudinal ligament and resected the posterior osteophytes from the posterior aspect of the vertebral bodies of 6 and 7.  At this point, I then went underneath the uncovertebral joint and undercut it.  I was able now to sweep underneath the C7 vertebral body centrally into the right corresponding where  the disk herniation was without any significant compression.  I felt as I had a good diskectomy, I removed the fragmented disk consistent with what was seen on the preoperative MRI.  At this point, I rasped the endplates, trialed, and placed a 7 small nanoLOCK Titan cage.  This was packed with the ViviGen allograft bone substitute.  I had good fixation. I then repositioned my distraction pins into the body from C7 into C5. I repositioned my retractor and my microscope and repeated the entire procedure that I just done.  At this level, there was no significant fragment of disk, just more of lateral recess and foraminal stenosis.  I undercut the uncovertebral joints, released the anulus, so I had an excellent posterior decompression.  I then rasped the endplates and then placed the same size implant after trialing.  With both implants in place, I then removed the distraction pins and placed bone wax in the screw holes for hemostasis.  I contoured the anterior cervical plate, affixed with 14-mm self-drilling screws into the body of 5, 6, and 7. All screws had excellent purchase.  I used the final torquing lock device to secure the screws according to manufacture's standards.  I then irrigated the wound copiously with normal saline and made sure hemostasis using bipolar electrocautery and FloSeal.  Final x-rays were taken, which were satisfactory.  I returned the trachea and esophagus to midline and then closed the platysma with interrupted #1 Vicryl sutures.  The skin was then closed with 3-0 Monocryl.  Steri-Strips and dry dressing were applied and the patient was ultimately extubated, transferred to PACU without incident.  At the end of the case, all needle and sponge counts were correct.  There were no adverse intraoperative events.     Courtney [name], M.D.   ______________________________ Courtney [name], M.D.    DDB/MEDQ  D:  06/28/2016  T:  06/29/2016  Job:   [id number]  cc:   Dr. [name]' office

## 2016-06-29 NOTE — Anesthesia Postprocedure Evaluation (Signed)
Anesthesia Post Note  Patient: Courtney [name]  Procedure(s) Performed: Procedure(s) (LRB): ACDF C5-7 ANTERIOR CERVICAL DECOMPRESSION/DISCECTOMY FUSION 2 LEVELS (N/A)  Patient location during evaluation: PACU Anesthesia Type: General Level of consciousness: awake and alert Pain management: pain level controlled Vital Signs Assessment: post-procedure vital signs reviewed and stable Respiratory status: spontaneous breathing, nonlabored ventilation, respiratory function stable and patient connected to nasal cannula oxygen Cardiovascular status: blood pressure returned to baseline and stable Postop Assessment: no signs of nausea or vomiting Anesthetic complications: no       Last Vitals:  Vitals:   06/29/16 0400 06/29/16 0729  BP: 107/60 117/62  Pulse: 76 98  Resp: 18 18  Temp: 37.1 C 37.1 C    Last Pain:  Vitals:   06/29/16 0647  TempSrc:   PainSc: 6                  [name]

## 2016-06-29 NOTE — Evaluation (Signed)
Physical Therapy Evaluation and Discharge Patient Details Name: Courtney [name] MRN: [id number] DOB: [birth year] Today's Date: 06/29/2016   History of Present Illness  Pt is a 66 y/o female who presents s/p C5-C6 ACDF on 06/28/16.  Clinical Impression  Patient evaluated by Physical Therapy with no further acute PT needs identified. All education has been completed and the patient has no further questions. At the time of PT eval pt was able to perform transfers and ambulation with gross supervision for safety to modified independence. See below for any follow-up Physical Therapy or equipment needs. PT is signing off. Thank you for this referral.     Follow Up Recommendations Outpatient PT;Supervision for mobility/OOB    Equipment Recommendations  Cane    Recommendations for Other Services       Precautions / Restrictions Precautions Precautions: Fall;Cervical Precaution Comments: Reviewed handout with pt and significant other Required Braces or Orthoses: Cervical Brace Cervical Brace: Hard collar Restrictions Weight Bearing Restrictions: No      Mobility  Bed Mobility Overal bed mobility: Needs Assistance Bed Mobility: Rolling;Sidelying to Sit;Sit to Sidelying Rolling: Modified independent (Device/Increase time) Sidelying to sit: Supervision     Sit to sidelying: Supervision General bed mobility comments: Increased time to elevate LE;s back into bed due to baseline knee issues. Otherwise was able to manage bed mobility well.   Transfers Overall transfer level: Modified independent Equipment used: None Transfers: Sit to/from Stand              Ambulation/Gait Ambulation/Gait assistance: Min Wellsite geologist (Feet): 350 Feet Assistive device: 1 person hand held assist Gait Pattern/deviations: Step-through pattern;Decreased stride length Gait velocity: Decreased Gait velocity interpretation: Below normal speed for age/gender General Gait Details: Mild  unsteadiness noted during gait training. HHA provided and pt was able to progress well. Feel she will be safe with SPC use.   Stairs Stairs: Yes Stairs assistance: Min guard Stair Management: One rail Left;Alternating pattern;Step to pattern;Forwards Number of Stairs: 10 General stair comments: VC's for general safety  Wheelchair Mobility    Modified Rankin (Stroke Patients Only)       Balance Overall balance assessment: Needs assistance Sitting-balance support: Feet supported;No upper extremity supported Sitting balance-Leahy Scale: Normal     Standing balance support: No upper extremity supported;During functional activity Standing balance-Leahy Scale: Fair                               Pertinent Vitals/Pain Pain Assessment: Faces Faces Pain Scale: Hurts little more Pain Location: Incision site Pain Descriptors / Indicators: Operative site guarding Pain Intervention(s): Limited activity within patient's tolerance;Monitored during session;Repositioned    Home Living Family/patient expects to be discharged to:: Private residence Living Arrangements: Alone Available Help at Discharge: Family;Available PRN/intermittently Type of Home: House Home Access: Stairs to enter Entrance Stairs-Rails: Right Entrance Stairs-Number of Steps: 3 Home Layout: One level Home Equipment: None      Prior Function Level of Independence: Independent               Hand Dominance   Dominant Hand: Right    Extremity/Trunk Assessment   Upper Extremity Assessment Upper Extremity Assessment: Defer to OT evaluation    Lower Extremity Assessment Lower Extremity Assessment: Overall WFL for tasks assessed    Cervical / Trunk Assessment Cervical / Trunk Assessment: Other exceptions Cervical / Trunk Exceptions: s/p surgery  Communication   Communication: No difficulties  Cognition Arousal/Alertness: Awake/alert Behavior  During Therapy: WFL for tasks  assessed/performed Overall Cognitive Status: Within Functional Limits for tasks assessed                      General Comments      Exercises     Assessment/Plan    PT Assessment Patent does not need any further PT services  PT Problem List Decreased strength;Decreased range of motion;Decreased activity tolerance;Decreased balance;Decreased mobility;Decreased knowledge of use of DME;Decreased safety awareness;Decreased knowledge of precautions;Pain          PT Treatment Interventions      PT Goals (Current goals can be found in the Care Plan section)  Acute Rehab PT Goals Patient Stated Goal: Home today PT Goal Formulation: All assessment and education complete, DC therapy    Frequency     Barriers to discharge        Co-evaluation               End of Session Equipment Utilized During Treatment: Gait belt;Cervical collar Activity Tolerance: Patient tolerated treatment well Patient left: in bed;with call bell/phone within reach;with family/visitor present Nurse Communication: Mobility status    Functional Assessment Tool Used: Clinical judgement Functional Limitation: Mobility: Walking and moving around Mobility: Walking and Moving Around Current Status [id number]): At least 1 percent but less than 20 percent impaired, limited or restricted Mobility: Walking and Moving Around Goal Status [phone]): At least 1 percent but less than 20 percent impaired, limited or restricted Mobility: Walking and Moving Around Discharge Status [phone]): At least 1 percent but less than 20 percent impaired, limited or restricted    Time: 0738-0805 PT Time Calculation (min) (ACUTE ONLY): 27 min   Charges:   PT Evaluation $PT Eval Moderate Complexity: 1 Procedure PT Treatments $Gait Training: 8-22 mins   PT G Codes:   PT G-Codes **NOT FOR INPATIENT CLASS** Functional Assessment Tool Used: Clinical judgement Functional Limitation: Mobility: Walking and moving  around Mobility: Walking and Moving Around Current Status [id number]): At least 1 percent but less than 20 percent impaired, limited or restricted Mobility: Walking and Moving Around Goal Status [phone]): At least 1 percent but less than 20 percent impaired, limited or restricted Mobility: Walking and Moving Around Discharge Status [phone]): At least 1 percent but less than 20 percent impaired, limited or restricted    Thelma Comp 06/29/2016, 9:03 AM   Rolinda Roan, PT, DPT Acute Rehabilitation Services Pager: [phone]

## 2016-06-29 NOTE — Progress Notes (Signed)
    Subjective: 1 Day Post-Op Procedure(s) (LRB): ACDF C5-7 ANTERIOR CERVICAL DECOMPRESSION/DISCECTOMY FUSION 2 LEVELS (N/A) Patient reports pain as 3 on 0-10 scale.   Denies CP or SOB.  Voiding without difficulty. Positive flatus. Objective: Vital signs in last 24 hours: Temp:  [97.3 F (36.3 C)-98.7 F (37.1 C)] 98.7 F (37.1 C) (01/18 0729) Pulse Rate:  [76-106] 98 (01/18 0729) Resp:  [8-18] 18 (01/18 0729) BP: (99-134)/(48-80) 117/62 (01/18 0729) SpO2:  [93 %-98 %] 95 % (01/18 0729)  Intake/Output from previous day: 01/17 0701 - 01/18 0700 In: 1885 [P.O.:60; I.V.:1825] Out: 500 [Urine:350; Blood:150] Intake/Output this shift: No intake/output data recorded.  Labs: No results for input(s): HGB in the last 72 hours. No results for input(s): WBC, RBC, HCT, PLT in the last 72 hours. No results for input(s): NA, K, CL, CO2, BUN, CREATININE, GLUCOSE, CALCIUM in the last 72 hours. No results for input(s): LABPT, INR in the last 72 hours.  Physical Exam: Neurologically intact ABD soft Sensation intact distally Dorsiflexion/Plantar flexion intact Incision: no drainage Compartment soft  Assessment/Plan: 1 Day Post-Op Procedure(s) (LRB): ACDF C5-7 ANTERIOR CERVICAL DECOMPRESSION/DISCECTOMY FUSION 2 LEVELS (N/A) Advance diet Up with therapy  Pt will DC after cleared by PT Meds on chart  Fairy Ashlock, Darla Lesches for Dr. Melina Schools [facility] [phone] 06/29/2016, 7:33 AM    Patient ID: Courtney [name], female   DOB: [birth year], 66 y.o.   MRN: [id number]

## 2016-06-29 NOTE — Progress Notes (Signed)
Patient alert and oriented, mae's well, voiding adequate amount of urine, swallowing without difficulty, c/o pain and meds given prior to discharged for ride and discomfort. Patient discharged home with family. Script and discharged instructions given to patient. Patient and family stated understanding of instructions given. Pt. Has an appointment with Dr. Rolena Infante in 2 weeks

## 2016-06-30 ENCOUNTER — Encounter (HOSPITAL_COMMUNITY): Payer: Self-pay | Admitting: Orthopedic Surgery

## 2016-07-01 DIAGNOSIS — M542 Cervicalgia: Secondary | ICD-10-CM | POA: Diagnosis not present

## 2016-07-03 NOTE — Discharge Summary (Signed)
Physician Discharge Summary  Patient ID: CAMBRA ADWELL MRN: [id number] DOB/AGE: [birth year] 66 y.o.  Admit date: 06/28/2016 Discharge date: 07/03/2016  Admission Diagnoses:  Cervical Degenerative Disc Disease  Discharge Diagnoses:  Active Problems:   Neck pain   Past Medical History:  Diagnosis Date  . Arthritis   . Bicuspid aortic valve    bicuspid AV with no stenosis or regurgitation 01/2015 echo   . Complication of anesthesia    light weight  . Depression   . Family history of adverse reaction to anesthesia    mom had skin reaction to anesthesia but cant remember what it was or exactly what happened  . Fibromyalgia   . GERD (gastroesophageal reflux disease)   . Hyperlipidemia   . Sleep apnea    does not wear CPAP    Surgeries: Procedure(s): ACDF C5-7 ANTERIOR CERVICAL DECOMPRESSION/DISCECTOMY FUSION 2 LEVELS on 06/28/2016   Consultants (if any):   Discharged Condition: Improved  Hospital Course: JANINNE GOERING is an 66 y.o. female who was admitted 06/28/2016 with a diagnosis of Cervical Degenerative Disc Disease and went to the operating room on 06/28/2016 and underwent the above named procedures.  Post op Day one pt reports incisional pain is well controlled on oral medications.  Pt is voiding w/o difficulty. Pt is ambulating in hall.  Pt cleared by OT for DC.  She was given perioperative antibiotics:  Anti-infectives    Start     Dose/Rate Route Frequency Ordered Stop   06/28/16 2000  ceFAZolin (ANCEF) IVPB 2g/100 mL premix     2 g 200 mL/hr over 30 Minutes Intravenous Every 8 hours 06/28/16 1626 06/29/16 0342   06/28/16 0549  ceFAZolin (ANCEF) IVPB 2g/100 mL premix     2 g 200 mL/hr over 30 Minutes Intravenous 30 min pre-op 06/28/16 0549 06/28/16 1254    .  She was given sequential compression devices, early ambulation, and TED for DVT prophylaxis.  She benefited maximally from the hospital stay and there were no complications.    Recent vital signs:   Vitals:   06/29/16 0400 06/29/16 0729  BP: 107/60 117/62  Pulse: 76 98  Resp: 18 18  Temp: 98.7 F (37.1 C) 98.7 F (37.1 C)    Recent laboratory studies:  Lab Results  Component Value Date   HGB 12.4 06/22/2016   HGB 12.5 04/16/2015   Lab Results  Component Value Date   WBC 5.7 06/22/2016   PLT 179 06/22/2016   No results found for: INR Lab Results  Component Value Date   NA 139 06/22/2016   K 4.2 06/22/2016   CL 105 06/22/2016   CO2 25 06/22/2016   BUN 15 06/22/2016   CREATININE 0.79 06/22/2016   GLUCOSE 100 (H) 06/22/2016    Discharge Medications:   Allergies as of 06/29/2016      Reactions   Fish Allergy Anaphylaxis   Can tolerate shellfish    Peanuts [peanut Oil] Anaphylaxis   ALL NUTS and their derivatives      Medication List    STOP taking these medications   lidocaine 5 % Commonly known as:  LIDODERM   naproxen 250 MG tablet Commonly known as:  NAPROSYN   oxycodone 5 MG capsule Commonly known as:  OXY-IR   VOLTAREN 1 % Gel Generic drug:  diclofenac sodium     TAKE these medications   aspirin 81 MG EC tablet Take 81 mg by mouth daily. Will stop prior to procedure   atorvastatin 40 MG  tablet Commonly known as:  LIPITOR TAKE 1 TABLET DAILY   Coenzyme Q10 300 MG Caps Take 300 mg by mouth daily.   DULoxetine 30 MG capsule Commonly known as:  CYMBALTA Take 60 mg by mouth daily. TAKES 2 TABLETS   EPIPEN JR 2-PAK 0.15 MG/0.3ML injection Generic drug:  EPINEPHrine Inject 0.15 mg into the muscle as needed for anaphylaxis.   fluticasone 50 MCG/ACT nasal spray Commonly known as:  FLONASE 2 sprays to each nostril daily   levothyroxine 25 MCG tablet Commonly known as:  SYNTHROID, LEVOTHROID Take 1 tablet (25 mcg total) by mouth daily before breakfast.   loratadine 10 MG tablet Commonly known as:  CLARITIN 1 tab by mouth daily as needed FOR ALLERGIES   MAGNESIUM CITRATE PO Take 400 mg by mouth 2 (two) times daily.   methocarbamol  500 MG tablet Commonly known as:  ROBAXIN Take 1 tablet (500 mg total) by mouth 3 (three) times daily as needed for muscle spasms.   multivitamin with minerals tablet Take 1 tablet by mouth every other day.   OMEGA 3 PO Take 2 capsules by mouth 2 (two) times daily.   omeprazole 20 MG capsule Commonly known as:  PRILOSEC Take 20 mg by mouth daily as needed (hesrtburn).   ondansetron 4 MG tablet Commonly known as:  ZOFRAN Take 1 tablet (4 mg total) by mouth every 8 (eight) hours as needed for nausea or vomiting.   oxyCODONE-acetaminophen 10-325 MG tablet Commonly known as:  PERCOCET Take 1 tablet by mouth every 4 (four) hours as needed for pain.   pregabalin 100 MG capsule Commonly known as:  LYRICA Take 1 capsule (100 mg total) by mouth 5 (five) times daily.   traZODone 100 MG tablet Commonly known as:  DESYREL Take 100 mg by mouth at bedtime as needed for sleep.   urea 40 % Crea Commonly known as:  CARMOL Apply 1 application topically every other day.   VITAMIN B COMPLEX PO Take 1 tablet by mouth daily.   VITAMIN D-1000 MAX ST 1000 units tablet Generic drug:  Cholecalciferol Take 1,000 Units by mouth daily.   vitamin E 400 UNIT capsule Take 400 Units by mouth daily.       Diagnostic Studies: Dg Cervical Spine 2-3 Views  Result Date: 06/28/2016 CLINICAL DATA:  Anterior cervical fusion. EXAM: DG C-ARM 61-120 MIN; CERVICAL SPINE - 2-3 VIEW COMPARISON:  None. FLUOROSCOPY TIME:  49 seconds FINDINGS: Anterior cervical fusion from C5 through C7 with plate and screw fixation and interbody spacer devices in satisfactory position. Normal alignment. IMPRESSION: ACDF C5-7. Electronically Signed   By: Kathreen Devoid   On: 06/28/2016 12:53   Dg C-arm 1-60 Min  Result Date: 06/28/2016 CLINICAL DATA:  Anterior cervical fusion. EXAM: DG C-ARM 61-120 MIN; CERVICAL SPINE - 2-3 VIEW COMPARISON:  None. FLUOROSCOPY TIME:  49 seconds FINDINGS: Anterior cervical fusion from C5 through C7  with plate and screw fixation and interbody spacer devices in satisfactory position. Normal alignment. IMPRESSION: ACDF C5-7. Electronically Signed   By: Kathreen Devoid   On: 06/28/2016 12:53   Mm Screening Breast Tomo Bilateral  Result Date: 06/08/2016 CLINICAL DATA:  Screening. EXAM: 2D DIGITAL SCREENING BILATERAL MAMMOGRAM WITH CAD AND ADJUNCT TOMO COMPARISON:  Previous exam(s). ACR Breast Density Category b: There are scattered areas of fibroglandular density. FINDINGS: There are no findings suspicious for malignancy. Images were processed with CAD. IMPRESSION: No mammographic evidence of malignancy. A result letter of this screening mammogram will be mailed  directly to the patient. RECOMMENDATION: Screening mammogram in one year. (Code:SM-B-01Y) BI-RADS CATEGORY  1: Negative. Electronically Signed   By: Franki Cabot M.D.   On: 06/08/2016 15:41    Disposition: 01-Home or Self Care Pt will present to clinic in 2 weeks Post op medication provided  Discharge Instructions    Incentive spirometry RT    Complete by:  As directed       Follow-up Information    BROOKS,DAHARI D, MD Follow up in 2 week(s).   Specialty:  Orthopedic Surgery Why:  If symptoms worsen, For suture removal, For wound re-check Contact information: [address] [place] [address] [id number]            Signed: Valinda Hoar 07/03/2016, 1:36 PM

## 2016-07-07 DIAGNOSIS — M545 Low back pain: Secondary | ICD-10-CM | POA: Diagnosis not present

## 2016-07-07 DIAGNOSIS — M2550 Pain in unspecified joint: Secondary | ICD-10-CM | POA: Diagnosis not present

## 2016-07-07 DIAGNOSIS — M542 Cervicalgia: Secondary | ICD-10-CM | POA: Diagnosis not present

## 2016-07-07 DIAGNOSIS — R5383 Other fatigue: Secondary | ICD-10-CM | POA: Diagnosis not present

## 2016-07-07 DIAGNOSIS — M150 Primary generalized (osteo)arthritis: Secondary | ICD-10-CM | POA: Diagnosis not present

## 2016-07-07 DIAGNOSIS — E663 Overweight: Secondary | ICD-10-CM | POA: Diagnosis not present

## 2016-07-07 DIAGNOSIS — Z6828 Body mass index (BMI) 28.0-28.9, adult: Secondary | ICD-10-CM | POA: Diagnosis not present

## 2016-07-12 DIAGNOSIS — M533 Sacrococcygeal disorders, not elsewhere classified: Secondary | ICD-10-CM | POA: Diagnosis not present

## 2016-07-14 DIAGNOSIS — M25511 Pain in right shoulder: Secondary | ICD-10-CM | POA: Diagnosis not present

## 2016-07-14 DIAGNOSIS — M542 Cervicalgia: Secondary | ICD-10-CM | POA: Diagnosis not present

## 2016-07-14 DIAGNOSIS — Z4789 Encounter for other orthopedic aftercare: Secondary | ICD-10-CM | POA: Diagnosis not present

## 2016-07-14 DIAGNOSIS — M50023 Cervical disc disorder at C6-C7 level with myelopathy: Secondary | ICD-10-CM | POA: Diagnosis not present

## 2016-07-28 ENCOUNTER — Other Ambulatory Visit (INDEPENDENT_AMBULATORY_CARE_PROVIDER_SITE_OTHER): Payer: Medicare Other

## 2016-07-28 DIAGNOSIS — R5383 Other fatigue: Secondary | ICD-10-CM

## 2016-07-28 LAB — T3, FREE: T3, Free: 2.8 pg/mL (ref 2.3–4.2)

## 2016-07-28 LAB — TSH: TSH: 1.87 u[IU]/mL (ref 0.35–4.50)

## 2016-07-28 LAB — T4, FREE: Free T4: 0.79 ng/dL (ref 0.60–1.60)

## 2016-08-01 ENCOUNTER — Ambulatory Visit (INDEPENDENT_AMBULATORY_CARE_PROVIDER_SITE_OTHER): Payer: Medicare Other | Admitting: Endocrinology

## 2016-08-01 ENCOUNTER — Encounter: Payer: Self-pay | Admitting: Endocrinology

## 2016-08-01 VITALS — BP 110/62 | HR 80 | Ht 64.0 in | Wt 159.0 lb

## 2016-08-01 DIAGNOSIS — E038 Other specified hypothyroidism: Secondary | ICD-10-CM

## 2016-08-01 NOTE — Patient Instructions (Signed)
Try 1 1/2 pills daily

## 2016-08-01 NOTE — Progress Notes (Signed)
Patient ID: Courtney [name], female   DOB: 1950-10-27, 66 y.o.   MRN: [id number]            Referring physician: Kelton Pillar  Chief complaint: Follow-up of thyroid  History of Present Illness:  Initial history from patient was a follows: Patient said that she has been feeling fatigued for several years, this is not any worse recently. She has also had difficulty with tending to gain weight.  She thinks she has gained 10 pounds in the last year and she thinks her diet is fairly good. She does not complain of any cold intolerance except having cold sensations in her feet. She does not tolerate extremes of temperature ranges She has had some tendency to dry skin and hair loss Has not had any difficulty with concentration or memory  Most recent thyroid levels prior to her initial consultation done by PCP in December showed TSH of 1.8 and total T4 of 6.0 TSH in 9/17 was 2.5  Since her free T4 was low normal at 0.67 she is not taking a trial of levothyroxine 25 g daily since first week of January 2018 She is convinced that she is feeling better with her energy level although not completely back to normal She is also able to lose 6 pounds which previously she had difficulty doing and her clothes are fitting better She thinks this is not related to having her neck surgery Her skin is also better but she still has a little hair loss    Wt Readings from Last 3 Encounters:  08/01/16 159 lb (72.1 kg)  06/22/16 165 lb 12.8 oz (75.2 kg)  06/16/16 167 lb 6.4 oz (75.9 kg)    Lab Results  Component Value Date   TSH 1.87 07/28/2016   FREET4 0.79 07/28/2016   FREET4 0.67 06/16/2016    Lab Results  Component Value Date   T3FREE 2.8 07/28/2016   T3FREE 2.6 06/16/2016      Past Medical History:  Diagnosis Date  . Arthritis   . Bicuspid aortic valve    bicuspid AV with no stenosis or regurgitation 01/2015 echo   . Complication of anesthesia    light weight  . Depression   .  Family history of adverse reaction to anesthesia    mom had skin reaction to anesthesia but cant remember what it was or exactly what happened  . Fibromyalgia   . GERD (gastroesophageal reflux disease)   . Hyperlipidemia   . Sleep apnea    does not wear CPAP    Past Surgical History:  Procedure Laterality Date  . ABDOMINOPLASTY    . ANTERIOR CERVICAL DECOMP/DISCECTOMY FUSION N/A 06/28/2016   Procedure: ACDF C5-7 ANTERIOR CERVICAL DECOMPRESSION/DISCECTOMY FUSION 2 LEVELS;  Surgeon: Melina Schools, MD;  Location: [facility];  Service: Orthopedics;  Laterality: N/A;  Requests 3 hrs  . COLONOSCOPY    . DENTAL SURGERY    . HERNIA REPAIR    . LUMBAR LAMINECTOMY/DECOMPRESSION MICRODISCECTOMY  04/21/2015   Procedure: DECOMPRESSION LUMBAR THREE-LUMBAR FOUR;  Surgeon: Melina Schools, MD;  Location: [facility];  Service: Orthopedics;;  . PARATHYROIDECTOMY  2012  . SHOULDER ARTHROSCOPY W/ ACROMIAL REPAIR Right   . TONSILLECTOMY      Family History  Problem Relation Age of Onset  . COPD Mother   . Cancer Mother   . Hypertension Mother   . Hyperlipidemia Mother   . Heart disease Mother   . Thyroid disease Mother   . Thyroid disease Sister   .  Thyroid disease Daughter     Social History:  reports that she has never smoked. She has never used smokeless tobacco. She reports that she drinks about 0.6 oz of alcohol per week . She reports that she does not use drugs.  Allergies:  Allergies  Allergen Reactions  . Fish Allergy Anaphylaxis    Can tolerate shellfish   . Peanuts [Peanut Oil] Anaphylaxis    ALL NUTS and their derivatives    Allergies as of 08/01/2016      Reactions   Fish Allergy Anaphylaxis   Can tolerate shellfish    Peanuts [peanut Oil] Anaphylaxis   ALL NUTS and their derivatives      Medication List       Accurate as of 08/01/16 10:35 AM. Always use your most recent med list.          aspirin 81 MG EC tablet Take 81 mg by mouth daily. Will stop prior to procedure     atorvastatin 40 MG tablet Commonly known as:  LIPITOR TAKE 1 TABLET DAILY   Coenzyme Q10 300 MG Caps Take 300 mg by mouth daily.   DULoxetine 30 MG capsule Commonly known as:  CYMBALTA Take 60 mg by mouth daily. TAKES 2 TABLETS   EPIPEN JR 2-PAK 0.15 MG/0.3ML injection Generic drug:  EPINEPHrine Inject 0.15 mg into the muscle as needed for anaphylaxis.   fluticasone 50 MCG/ACT nasal spray Commonly known as:  FLONASE 2 sprays to each nostril daily   levothyroxine 25 MCG tablet Commonly known as:  SYNTHROID, LEVOTHROID Take 1 tablet (25 mcg total) by mouth daily before breakfast.   loratadine 10 MG tablet Commonly known as:  CLARITIN 1 tab by mouth daily as needed FOR ALLERGIES   MAGNESIUM CITRATE PO Take 400 mg by mouth 2 (two) times daily.   methocarbamol 500 MG tablet Commonly known as:  ROBAXIN Take 1 tablet (500 mg total) by mouth 3 (three) times daily as needed for muscle spasms.   multivitamin with minerals tablet Take 1 tablet by mouth every other day.   OMEGA 3 PO Take 2 capsules by mouth 2 (two) times daily.   omeprazole 20 MG capsule Commonly known as:  PRILOSEC Take 20 mg by mouth daily as needed (hesrtburn).   ondansetron 4 MG tablet Commonly known as:  ZOFRAN Take 1 tablet (4 mg total) by mouth every 8 (eight) hours as needed for nausea or vomiting.   oxyCODONE-acetaminophen 10-325 MG tablet Commonly known as:  PERCOCET Take 1 tablet by mouth every 4 (four) hours as needed for pain.   pregabalin 100 MG capsule Commonly known as:  LYRICA Take 1 capsule (100 mg total) by mouth 5 (five) times daily.   traZODone 100 MG tablet Commonly known as:  DESYREL Take 100 mg by mouth at bedtime as needed for sleep.   urea 40 % Crea Commonly known as:  CARMOL Apply 1 application topically every other day.   VITAMIN B COMPLEX PO Take 1 tablet by mouth daily.   VITAMIN D-1000 MAX ST 1000 units tablet Generic drug:  Cholecalciferol Take 1,000 Units by  mouth daily.   vitamin E 400 UNIT capsule Take 400 Units by mouth daily.       LABS:  Lab on 07/28/2016  Component Date Value Ref Range Status  . TSH 07/28/2016 1.87  0.35 - 4.50 uIU/mL Final  . Free T4 07/28/2016 0.79  0.60 - 1.60 ng/dL Final   Comment: Specimens from patients who are undergoing biotin therapy  and /or ingesting biotin supplements may contain high levels of biotin.  The higher biotin concentration in these specimens interferes with this Free T4 assay.  Specimens that contain high levels  of biotin may cause false high results for this Free T4 assay.  Please interpret results in light of the total clinical presentation of the patient.    . T3, Free 07/28/2016 2.8  2.3 - 4.2 pg/mL Final        Review of Systems   PHYSICAL EXAM:  BP 110/62   Pulse 80   Ht 5\' 4"  (1.626 m)   Wt 159 lb (72.1 kg)   LMP  (LMP Unknown)   SpO2 95%   BMI 27.29 kg/m   .Reflexes are appearing normal at biceps    ASSESSMENT:   Chronic fatigue, long-standing and nonspecific weight gain. With a trial of levothyroxine 25 g daily for her low normal free T4 level she is subjectively improved including weight loss which she is very pleased about  Discussed that she likely has mild secondary hypothyroidism Her free T4 is improved but still low normal Free T3 is also relatively better TSH is still normal No history suggestive of adrenal insufficiency      PLAN:   Trial of 37.5 g of levothyroxine She will have her labs checked again in 6 weeks and follow-up in 3 months in the office  [facility] 08/01/2016, 10:35 AM

## 2016-08-10 ENCOUNTER — Telehealth: Payer: Self-pay | Admitting: Endocrinology

## 2016-08-10 MED ORDER — LEVOTHYROXINE SODIUM 25 MCG PO TABS: 25.0000 ug | ORAL_TABLET | Freq: Every day | ORAL | 2 refills | Status: DC

## 2016-08-10 NOTE — Telephone Encounter (Signed)
Please advise further on the medication dosage of the levothyroxine. I looked in epic and 37.5 mcg is not available to be prescribed.

## 2016-08-10 NOTE — Telephone Encounter (Signed)
Please call in the increased dosing of the levothyroxine to the [facility] per his last note 79.5

## 2016-08-10 NOTE — Telephone Encounter (Signed)
Refill submitted. 

## 2016-08-10 NOTE — Telephone Encounter (Signed)
Please prescribe 25 g, 1-1/2 tablets daily

## 2016-08-14 DIAGNOSIS — Z4789 Encounter for other orthopedic aftercare: Secondary | ICD-10-CM | POA: Diagnosis not present

## 2016-08-14 DIAGNOSIS — M542 Cervicalgia: Secondary | ICD-10-CM | POA: Diagnosis not present

## 2016-08-24 DIAGNOSIS — M542 Cervicalgia: Secondary | ICD-10-CM | POA: Diagnosis not present

## 2016-08-24 DIAGNOSIS — M2550 Pain in unspecified joint: Secondary | ICD-10-CM | POA: Diagnosis not present

## 2016-08-24 DIAGNOSIS — Z6827 Body mass index (BMI) 27.0-27.9, adult: Secondary | ICD-10-CM | POA: Diagnosis not present

## 2016-08-24 DIAGNOSIS — M154 Erosive (osteo)arthritis: Secondary | ICD-10-CM | POA: Diagnosis not present

## 2016-08-24 DIAGNOSIS — E663 Overweight: Secondary | ICD-10-CM | POA: Diagnosis not present

## 2016-08-24 DIAGNOSIS — M150 Primary generalized (osteo)arthritis: Secondary | ICD-10-CM | POA: Diagnosis not present

## 2016-09-12 DIAGNOSIS — G894 Chronic pain syndrome: Secondary | ICD-10-CM | POA: Diagnosis not present

## 2016-09-13 ENCOUNTER — Other Ambulatory Visit (INDEPENDENT_AMBULATORY_CARE_PROVIDER_SITE_OTHER): Payer: Medicare Other

## 2016-09-13 DIAGNOSIS — E038 Other specified hypothyroidism: Secondary | ICD-10-CM

## 2016-09-13 LAB — T4, FREE: Free T4: 0.72 ng/dL (ref 0.60–1.60)

## 2016-09-26 DIAGNOSIS — M542 Cervicalgia: Secondary | ICD-10-CM | POA: Diagnosis not present

## 2016-09-26 DIAGNOSIS — M5137 Other intervertebral disc degeneration, lumbosacral region: Secondary | ICD-10-CM | POA: Diagnosis not present

## 2016-09-26 DIAGNOSIS — G894 Chronic pain syndrome: Secondary | ICD-10-CM | POA: Diagnosis not present

## 2016-09-26 DIAGNOSIS — Z9889 Other specified postprocedural states: Secondary | ICD-10-CM | POA: Diagnosis not present

## 2016-10-11 DIAGNOSIS — M5137 Other intervertebral disc degeneration, lumbosacral region: Secondary | ICD-10-CM | POA: Diagnosis not present

## 2016-10-22 ENCOUNTER — Other Ambulatory Visit: Payer: Self-pay | Admitting: Endocrinology

## 2016-10-22 DIAGNOSIS — E038 Other specified hypothyroidism: Secondary | ICD-10-CM

## 2016-10-26 ENCOUNTER — Other Ambulatory Visit (INDEPENDENT_AMBULATORY_CARE_PROVIDER_SITE_OTHER): Payer: Medicare Other

## 2016-10-26 DIAGNOSIS — E038 Other specified hypothyroidism: Secondary | ICD-10-CM

## 2016-10-26 LAB — T4, FREE: Free T4: 0.76 ng/dL (ref 0.60–1.60)

## 2016-10-26 LAB — T3, FREE: T3, Free: 2.7 pg/mL (ref 2.3–4.2)

## 2016-10-26 LAB — TSH: TSH: 0.99 u[IU]/mL (ref 0.35–4.50)

## 2016-11-01 ENCOUNTER — Ambulatory Visit: Payer: Medicare Other | Admitting: Endocrinology

## 2016-11-07 ENCOUNTER — Other Ambulatory Visit: Payer: Self-pay | Admitting: Endocrinology

## 2016-11-13 NOTE — Anesthesia Postprocedure Evaluation (Signed)
Anesthesia Post Note  Patient: Courtney [name]  Procedure(s) Performed: Procedure(s) (LRB): ACDF C5-7 ANTERIOR CERVICAL DECOMPRESSION/DISCECTOMY FUSION 2 LEVELS (N/A)     Anesthesia Post Evaluation  Last Vitals:  Vitals:   06/29/16 0400 06/29/16 0729  BP: 107/60 117/62  Pulse: 76 98  Resp: 18 18  Temp: 37.1 C 37.1 C    Last Pain:  Vitals:   06/29/16 1153  TempSrc:   PainSc: 3                  Marquez Ceesay S

## 2016-11-13 NOTE — Addendum Note (Signed)
Addendum  created 11/13/16 1010 by Klynn Linnemann, MD   Sign clinical note    

## 2016-11-21 DIAGNOSIS — H9319 Tinnitus, unspecified ear: Secondary | ICD-10-CM | POA: Diagnosis not present

## 2016-11-21 DIAGNOSIS — E7800 Pure hypercholesterolemia, unspecified: Secondary | ICD-10-CM | POA: Diagnosis not present

## 2016-11-21 DIAGNOSIS — M5410 Radiculopathy, site unspecified: Secondary | ICD-10-CM | POA: Diagnosis not present

## 2016-11-21 DIAGNOSIS — R7303 Prediabetes: Secondary | ICD-10-CM | POA: Diagnosis not present

## 2016-11-21 DIAGNOSIS — Z23 Encounter for immunization: Secondary | ICD-10-CM | POA: Diagnosis not present

## 2016-11-22 DIAGNOSIS — N8111 Cystocele, midline: Secondary | ICD-10-CM | POA: Diagnosis not present

## 2016-11-22 DIAGNOSIS — N3281 Overactive bladder: Secondary | ICD-10-CM | POA: Diagnosis not present

## 2016-11-22 DIAGNOSIS — N3946 Mixed incontinence: Secondary | ICD-10-CM | POA: Insufficient documentation

## 2016-11-22 DIAGNOSIS — N952 Postmenopausal atrophic vaginitis: Secondary | ICD-10-CM | POA: Diagnosis not present

## 2016-11-22 DIAGNOSIS — N814 Uterovaginal prolapse, unspecified: Secondary | ICD-10-CM | POA: Diagnosis not present

## 2016-11-22 DIAGNOSIS — N812 Incomplete uterovaginal prolapse: Secondary | ICD-10-CM | POA: Diagnosis not present

## 2016-11-30 ENCOUNTER — Other Ambulatory Visit: Payer: Self-pay | Admitting: Endocrinology

## 2016-12-07 DIAGNOSIS — M549 Dorsalgia, unspecified: Secondary | ICD-10-CM | POA: Diagnosis not present

## 2016-12-07 DIAGNOSIS — M6281 Muscle weakness (generalized): Secondary | ICD-10-CM | POA: Diagnosis not present

## 2016-12-07 DIAGNOSIS — M62838 Other muscle spasm: Secondary | ICD-10-CM | POA: Diagnosis not present

## 2016-12-07 DIAGNOSIS — R278 Other lack of coordination: Secondary | ICD-10-CM | POA: Diagnosis not present

## 2016-12-07 DIAGNOSIS — N3946 Mixed incontinence: Secondary | ICD-10-CM | POA: Diagnosis not present

## 2016-12-20 DIAGNOSIS — M48061 Spinal stenosis, lumbar region without neurogenic claudication: Secondary | ICD-10-CM | POA: Diagnosis not present

## 2016-12-20 DIAGNOSIS — G894 Chronic pain syndrome: Secondary | ICD-10-CM | POA: Diagnosis not present

## 2016-12-21 DIAGNOSIS — M62838 Other muscle spasm: Secondary | ICD-10-CM | POA: Diagnosis not present

## 2016-12-21 DIAGNOSIS — N3946 Mixed incontinence: Secondary | ICD-10-CM | POA: Diagnosis not present

## 2016-12-21 DIAGNOSIS — M6281 Muscle weakness (generalized): Secondary | ICD-10-CM | POA: Diagnosis not present

## 2016-12-22 DIAGNOSIS — M4316 Spondylolisthesis, lumbar region: Secondary | ICD-10-CM | POA: Diagnosis not present

## 2016-12-22 DIAGNOSIS — M48061 Spinal stenosis, lumbar region without neurogenic claudication: Secondary | ICD-10-CM | POA: Diagnosis not present

## 2016-12-26 ENCOUNTER — Other Ambulatory Visit: Payer: Self-pay | Admitting: Orthopedic Surgery

## 2016-12-26 DIAGNOSIS — M545 Low back pain: Principal | ICD-10-CM

## 2016-12-26 DIAGNOSIS — G8929 Other chronic pain: Secondary | ICD-10-CM

## 2017-01-05 ENCOUNTER — Ambulatory Visit
Admission: RE | Admit: 2017-01-05 | Discharge: 2017-01-05 | Disposition: A | Discharge status: Home / Self Care | Payer: Medicare Other | Source: Ambulatory Visit | Attending: Orthopedic Surgery | Admitting: Orthopedic Surgery

## 2017-01-05 ENCOUNTER — Other Ambulatory Visit: Payer: Medicare Other

## 2017-01-05 DIAGNOSIS — M545 Low back pain: Principal | ICD-10-CM

## 2017-01-05 DIAGNOSIS — K648 Other hemorrhoids: Secondary | ICD-10-CM | POA: Insufficient documentation

## 2017-01-05 DIAGNOSIS — G4700 Insomnia, unspecified: Secondary | ICD-10-CM | POA: Insufficient documentation

## 2017-01-05 DIAGNOSIS — F988 Other specified behavioral and emotional disorders with onset usually occurring in childhood and adolescence: Secondary | ICD-10-CM | POA: Insufficient documentation

## 2017-01-05 DIAGNOSIS — M5416 Radiculopathy, lumbar region: Secondary | ICD-10-CM | POA: Diagnosis not present

## 2017-01-05 DIAGNOSIS — K219 Gastro-esophageal reflux disease without esophagitis: Secondary | ICD-10-CM | POA: Insufficient documentation

## 2017-01-05 DIAGNOSIS — G8929 Other chronic pain: Secondary | ICD-10-CM

## 2017-01-05 DIAGNOSIS — J45909 Unspecified asthma, uncomplicated: Secondary | ICD-10-CM | POA: Insufficient documentation

## 2017-01-05 MED ORDER — METHYLPREDNISOLONE ACETATE 40 MG/ML INJ SUSP (RADIOLOG
120.0000 mg | Freq: Once | INTRAMUSCULAR | Status: AC
  Administered 2017-01-05: 120 mg via EPIDURAL

## 2017-01-05 MED ORDER — IOPAMIDOL (ISOVUE-M 200) INJECTION 41%
1.0000 mL | Freq: Once | INTRAMUSCULAR | Status: AC
  Administered 2017-01-05: 1 mL via EPIDURAL

## 2017-01-05 NOTE — Discharge Instructions (Signed)

## 2017-01-09 DIAGNOSIS — M154 Erosive (osteo)arthritis: Secondary | ICD-10-CM | POA: Diagnosis not present

## 2017-01-09 DIAGNOSIS — Z6826 Body mass index (BMI) 26.0-26.9, adult: Secondary | ICD-10-CM | POA: Diagnosis not present

## 2017-01-09 DIAGNOSIS — Z79899 Other long term (current) drug therapy: Secondary | ICD-10-CM | POA: Diagnosis not present

## 2017-01-09 DIAGNOSIS — M2550 Pain in unspecified joint: Secondary | ICD-10-CM | POA: Diagnosis not present

## 2017-01-09 DIAGNOSIS — E663 Overweight: Secondary | ICD-10-CM | POA: Diagnosis not present

## 2017-01-09 DIAGNOSIS — M150 Primary generalized (osteo)arthritis: Secondary | ICD-10-CM | POA: Diagnosis not present

## 2017-03-21 DIAGNOSIS — M48061 Spinal stenosis, lumbar region without neurogenic claudication: Secondary | ICD-10-CM | POA: Diagnosis not present

## 2017-03-21 DIAGNOSIS — M4316 Spondylolisthesis, lumbar region: Secondary | ICD-10-CM | POA: Diagnosis not present

## 2017-03-23 DIAGNOSIS — Z23 Encounter for immunization: Secondary | ICD-10-CM | POA: Diagnosis not present

## 2017-04-09 ENCOUNTER — Other Ambulatory Visit: Payer: Self-pay

## 2017-04-09 ENCOUNTER — Other Ambulatory Visit: Payer: Self-pay | Admitting: Endocrinology

## 2017-04-09 ENCOUNTER — Telehealth: Payer: Self-pay | Admitting: Endocrinology

## 2017-04-09 DIAGNOSIS — Z9889 Other specified postprocedural states: Secondary | ICD-10-CM | POA: Diagnosis not present

## 2017-04-09 DIAGNOSIS — M545 Low back pain: Secondary | ICD-10-CM | POA: Diagnosis not present

## 2017-04-09 DIAGNOSIS — M542 Cervicalgia: Secondary | ICD-10-CM | POA: Diagnosis not present

## 2017-04-09 MED ORDER — LEVOTHYROXINE SODIUM 25 MCG PO TABS: ORAL_TABLET | ORAL | 0 refills | Status: DC

## 2017-04-09 NOTE — Telephone Encounter (Signed)
Called patient and let her know that I sent her prescription to the pharmacy and I made an office visit for her on October 31st at 1:30pm.

## 2017-04-09 NOTE — Telephone Encounter (Signed)
I have made her appointment for this week and she stated that she would like to get her labs same day.

## 2017-04-09 NOTE — Telephone Encounter (Signed)
Please advise if okay to fill? Last OV was 07/2016.

## 2017-04-09 NOTE — Telephone Encounter (Signed)
She takes 1-1/2 tablets of the 25 g.  Please give her 20 tablets only with note to make appointment for labs and office visit which is overdue

## 2017-04-09 NOTE — Telephone Encounter (Signed)
Patient needs prescription with refills for Levothyroxine .375 mcg  Sent to [facility] on [address]. Patient needs her medication today. She is out of medication.

## 2017-04-11 ENCOUNTER — Ambulatory Visit (INDEPENDENT_AMBULATORY_CARE_PROVIDER_SITE_OTHER): Payer: Medicare Other | Admitting: Endocrinology

## 2017-04-11 ENCOUNTER — Encounter: Payer: Self-pay | Admitting: Endocrinology

## 2017-04-11 VITALS — BP 100/66 | HR 78 | Ht 64.0 in | Wt 156.4 lb

## 2017-04-11 DIAGNOSIS — E038 Other specified hypothyroidism: Secondary | ICD-10-CM

## 2017-04-11 LAB — T3, FREE: T3 FREE: 2.8 pg/mL (ref 2.3–4.2)

## 2017-04-11 LAB — TSH: TSH: 1.05 u[IU]/mL (ref 0.35–4.50)

## 2017-04-11 LAB — T4, FREE: FREE T4: 0.79 ng/dL (ref 0.60–1.60)

## 2017-04-11 NOTE — Progress Notes (Signed)
Patient ID: Courtney [name], female   DOB: Apr 08, 1951, 66 y.o.   MRN: [id number]            Referring physician: Kelton Pillar  Chief complaint: Follow-up of thyroid  History of Present Illness:  Initial history from patient was a follows: Patient said that she has been feeling fatigued for several years, this is not any worse recently. She has also had difficulty with tending to gain weight.  She thinks she has gained 10 pounds in the last year and she thinks her diet is fairly good. She does not complain of any cold intolerance except having cold sensations in her feet. She does not tolerate extremes of temperature ranges She has had some tendency to dry skin and hair loss Has not had any difficulty with concentration or memory  Most recent thyroid levels prior to her initial consultation done by PCP in December showed TSH of 1.8 and total T4 of 6.0 TSH in 9/17 was 2.5  Since her free T4 was low normal at 0.67 she was given a trial of levothyroxine 25 g daily since first week of January 2018   Subsequently she was feeling better with her energy level although not completely back to normal She was also able to lose 6 pounds which previously she had difficulty doing and her clothes are fitting better  When she was seen in follow-up in February she still had a low normal free T4 and free T3 and she was told to take 37.5 g of levothyroxine However she has not followed up since then  She is concerned about her hair loss which has been chronic and recurring in patches also, has seen dermatologist for this Her energy level is generally fairly good although not completely normal She says her cold intolerance is less than before and her weight is slightly better again   Wt Readings from Last 3 Encounters:  04/11/17 156 lb 6.4 oz (70.9 kg)  08/01/16 159 lb (72.1 kg)  06/22/16 165 lb 12.8 oz (75.2 kg)    Lab Results  Component Value Date   TSH 0.99 10/26/2016   TSH 1.87  07/28/2016   FREET4 0.76 10/26/2016   FREET4 0.72 09/13/2016   FREET4 0.79 07/28/2016    Lab Results  Component Value Date   T3FREE 2.7 10/26/2016   T3FREE 2.8 07/28/2016   T3FREE 2.6 06/16/2016      Past Medical History:  Diagnosis Date  . Arthritis   . Bicuspid aortic valve    bicuspid AV with no stenosis or regurgitation 01/2015 echo   . Complication of anesthesia    light weight  . Depression   . Family history of adverse reaction to anesthesia    mom had skin reaction to anesthesia but cant remember what it was or exactly what happened  . Fibromyalgia   . GERD (gastroesophageal reflux disease)   . Hyperlipidemia   . Sleep apnea    does not wear CPAP    Past Surgical History:  Procedure Laterality Date  . ABDOMINOPLASTY    . ANTERIOR CERVICAL DECOMP/DISCECTOMY FUSION N/A 06/28/2016   Procedure: ACDF C5-7 ANTERIOR CERVICAL DECOMPRESSION/DISCECTOMY FUSION 2 LEVELS;  Surgeon: Melina Schools, MD;  Location: [facility];  Service: Orthopedics;  Laterality: N/A;  Requests 3 hrs  . COLONOSCOPY    . DENTAL SURGERY    . HERNIA REPAIR    . LUMBAR LAMINECTOMY/DECOMPRESSION MICRODISCECTOMY  04/21/2015   Procedure: DECOMPRESSION LUMBAR THREE-LUMBAR FOUR;  Surgeon: Melina Schools, MD;  Location:  [facility];  Service: Orthopedics;;  . PARATHYROIDECTOMY  2012  . SHOULDER ARTHROSCOPY W/ ACROMIAL REPAIR Right   . TONSILLECTOMY      Family History  Problem Relation Age of Onset  . COPD Mother   . Cancer Mother   . Hypertension Mother   . Hyperlipidemia Mother   . Heart disease Mother   . Thyroid disease Mother   . Thyroid disease Sister   . Thyroid disease Daughter     Social History:  reports that she has never smoked. She has never used smokeless tobacco. She reports that she drinks about 0.6 oz of alcohol per week . She reports that she does not use drugs.  Allergies:  Allergies  Allergen Reactions  . Fish Allergy Anaphylaxis    Can tolerate shellfish   . Peanuts [Peanut Oil]  Anaphylaxis    ALL NUTS and their derivatives    Allergies as of 04/11/2017      Reactions   Fish Allergy Anaphylaxis   Can tolerate shellfish    Peanuts [peanut Oil] Anaphylaxis   ALL NUTS and their derivatives      Medication List       Accurate as of 04/11/17  1:46 PM. Always use your most recent med list.          aspirin 81 MG EC tablet Take 81 mg by mouth daily. Will stop prior to procedure   atorvastatin 40 MG tablet Commonly known as:  LIPITOR TAKE 1 TABLET DAILY   Coenzyme Q10 300 MG Caps Take 300 mg by mouth daily.   DULoxetine 30 MG capsule Commonly known as:  CYMBALTA Take 60 mg by mouth daily. TAKES 2 TABLETS   EPIPEN JR 2-PAK 0.15 MG/0.3ML injection Generic drug:  EPINEPHrine Inject 0.15 mg into the muscle as needed for anaphylaxis.   fluticasone 50 MCG/ACT nasal spray Commonly known as:  FLONASE 2 sprays to each nostril daily   hydroxychloroquine 200 MG tablet Commonly known as:  PLAQUENIL Take 200 mg by mouth 2 (two) times daily.   levothyroxine 25 MCG tablet Commonly known as:  SYNTHROID, LEVOTHROID TAKE 1 AND 1/2 TABLET BY MOUTH EVERY MORNING   loratadine 10 MG tablet Commonly known as:  CLARITIN 1 tab by mouth daily as needed FOR ALLERGIES   MAGNESIUM CITRATE PO Take 400 mg by mouth 2 (two) times daily.   multivitamin with minerals tablet Take 1 tablet by mouth every other day.   OMEGA 3 PO Take 2 capsules by mouth 2 (two) times daily.   omeprazole 20 MG capsule Commonly known as:  PRILOSEC Take 20 mg by mouth daily as needed (hesrtburn).   oxyCODONE 5 MG immediate release tablet Commonly known as:  Oxy IR/ROXICODONE Take 5 mg by mouth 3 (three) times daily.   pregabalin 200 MG capsule Commonly known as:  LYRICA Take 200 mg by mouth.   traZODone 100 MG tablet Commonly known as:  DESYREL Take 100 mg by mouth at bedtime as needed for sleep.   urea 40 % Crea Commonly known as:  CARMOL Apply 1 application topically every  other day.   VITAMIN B COMPLEX PO Take 1 tablet by mouth daily.   VITAMIN D-1000 MAX ST 1000 units tablet Generic drug:  Cholecalciferol Take 1,000 Units by mouth daily.   vitamin E 400 UNIT capsule Take 400 Units by mouth daily.       LABS:  No visits with results within 1 Week(s) from this visit.  Latest known visit with results  is:  Lab on 10/26/2016  Component Date Value Ref Range Status  . TSH 10/26/2016 0.99  0.35 - 4.50 uIU/mL Final  . Free T4 10/26/2016 0.76  0.60 - 1.60 ng/dL Final   Comment: Specimens from patients who are undergoing biotin therapy and /or ingesting biotin supplements may contain high levels of biotin.  The higher biotin concentration in these specimens interferes with this Free T4 assay.  Specimens that contain high levels  of biotin may cause false high results for this Free T4 assay.  Please interpret results in light of the total clinical presentation of the patient.    . T3, Free 10/26/2016 2.7  2.3 - 4.2 pg/mL Final        Review of Systems  Still having significant back pain  PHYSICAL EXAM:  BP 100/66   Pulse 78   Ht 5\' 4"  (1.626 m)   Wt 156 lb 6.4 oz (70.9 kg)   LMP  (LMP Unknown)   SpO2 95%   BMI 26.85 kg/m   Thyroid not palpable .Reflexes are  normal at biceps Skin normal Has some alopecia areata No swelling of eyes or hands    ASSESSMENT:   Probable mild secondary hypothyroidism Subjectively improving with levothyroxine supplementation She has fatigue for other reasons   Alopecia: Unlikely to be related to thyroid disease and she will discuss with her rheumatologist again    PLAN:   For now will continue 37.5 g of levothyroxine She will have her labs checked again today and decide on doses   Rickayla Wieland 04/11/2017, 1:46 PM

## 2017-04-12 DIAGNOSIS — M545 Low back pain: Secondary | ICD-10-CM | POA: Diagnosis not present

## 2017-04-12 DIAGNOSIS — M4316 Spondylolisthesis, lumbar region: Secondary | ICD-10-CM | POA: Diagnosis not present

## 2017-04-14 DIAGNOSIS — M545 Low back pain: Secondary | ICD-10-CM | POA: Diagnosis not present

## 2017-04-17 ENCOUNTER — Telehealth: Payer: Self-pay | Admitting: Endocrinology

## 2017-04-17 ENCOUNTER — Other Ambulatory Visit: Payer: Self-pay

## 2017-04-17 MED ORDER — LEVOTHYROXINE SODIUM 25 MCG PO TABS: ORAL_TABLET | ORAL | 0 refills | Status: DC

## 2017-04-17 NOTE — Telephone Encounter (Signed)
Called patient and let her know that her Levothyroxine is the same dosage as she has been taking and I sent in a 90 day prescription.

## 2017-04-17 NOTE — Telephone Encounter (Signed)
Caller Name: Carmon Ginsberg  Best Number:[phone]  Pharmacy: [facility]   Reason for call:pt needs refill on Levothroxine, but also has concerns about how much she is supposed to be taking now, please advise, please call in a refill regarding how much she should be taking.  She is requesting 90 day refill if possible.

## 2017-04-17 NOTE — Telephone Encounter (Signed)
She takes the same dose of 25 g, 1-1/2 tablet daily

## 2017-04-20 DIAGNOSIS — M4807 Spinal stenosis, lumbosacral region: Secondary | ICD-10-CM | POA: Diagnosis not present

## 2017-05-18 DIAGNOSIS — G629 Polyneuropathy, unspecified: Secondary | ICD-10-CM | POA: Diagnosis not present

## 2017-05-18 DIAGNOSIS — E7800 Pure hypercholesterolemia, unspecified: Secondary | ICD-10-CM | POA: Diagnosis not present

## 2017-05-18 DIAGNOSIS — R739 Hyperglycemia, unspecified: Secondary | ICD-10-CM | POA: Diagnosis not present

## 2017-05-18 DIAGNOSIS — F329 Major depressive disorder, single episode, unspecified: Secondary | ICD-10-CM | POA: Diagnosis not present

## 2017-05-18 DIAGNOSIS — M4800 Spinal stenosis, site unspecified: Secondary | ICD-10-CM | POA: Diagnosis not present

## 2017-05-18 DIAGNOSIS — H9319 Tinnitus, unspecified ear: Secondary | ICD-10-CM | POA: Diagnosis not present

## 2017-05-18 DIAGNOSIS — J309 Allergic rhinitis, unspecified: Secondary | ICD-10-CM | POA: Diagnosis not present

## 2017-05-18 DIAGNOSIS — M2550 Pain in unspecified joint: Secondary | ICD-10-CM | POA: Diagnosis not present

## 2017-05-18 DIAGNOSIS — K219 Gastro-esophageal reflux disease without esophagitis: Secondary | ICD-10-CM | POA: Diagnosis not present

## 2017-05-18 DIAGNOSIS — Z0000 Encounter for general adult medical examination without abnormal findings: Secondary | ICD-10-CM | POA: Diagnosis not present

## 2017-05-30 DIAGNOSIS — M545 Low back pain: Secondary | ICD-10-CM | POA: Diagnosis not present

## 2017-05-30 DIAGNOSIS — Z6826 Body mass index (BMI) 26.0-26.9, adult: Secondary | ICD-10-CM | POA: Diagnosis not present

## 2017-05-30 DIAGNOSIS — M154 Erosive (osteo)arthritis: Secondary | ICD-10-CM | POA: Diagnosis not present

## 2017-05-30 DIAGNOSIS — E7800 Pure hypercholesterolemia, unspecified: Secondary | ICD-10-CM | POA: Diagnosis not present

## 2017-05-30 DIAGNOSIS — M2550 Pain in unspecified joint: Secondary | ICD-10-CM | POA: Diagnosis not present

## 2017-05-30 DIAGNOSIS — R739 Hyperglycemia, unspecified: Secondary | ICD-10-CM | POA: Diagnosis not present

## 2017-05-30 DIAGNOSIS — E663 Overweight: Secondary | ICD-10-CM | POA: Diagnosis not present

## 2017-05-30 DIAGNOSIS — M150 Primary generalized (osteo)arthritis: Secondary | ICD-10-CM | POA: Diagnosis not present

## 2017-06-18 ENCOUNTER — Encounter (HOSPITAL_COMMUNITY): Payer: Self-pay

## 2017-06-18 ENCOUNTER — Encounter (HOSPITAL_COMMUNITY)
Admission: RE | Admit: 2017-06-18 | Discharge: 2017-06-18 | Disposition: A | Discharge status: Home / Self Care | Payer: Medicare Other | Source: Ambulatory Visit | Attending: Orthopedic Surgery | Admitting: Orthopedic Surgery

## 2017-06-18 ENCOUNTER — Other Ambulatory Visit: Payer: Self-pay

## 2017-06-18 HISTORY — DX: Hypothyroidism, unspecified: E03.9

## 2017-06-18 LAB — CBC
HEMATOCRIT: 37.5 % (ref 36.0–46.0)
HEMOGLOBIN: 12.2 g/dL (ref 12.0–15.0)
MCH: 30.4 pg (ref 26.0–34.0)
MCHC: 32.5 g/dL (ref 30.0–36.0)
MCV: 93.5 fL (ref 78.0–100.0)
Platelets: 179 10*3/uL (ref 150–400)
RBC: 4.01 MIL/uL (ref 3.87–5.11)
RDW: 12.2 % (ref 11.5–15.5)
WBC: 6.4 10*3/uL (ref 4.0–10.5)

## 2017-06-18 LAB — BASIC METABOLIC PANEL
ANION GAP: 7 (ref 5–15)
BUN: 13 mg/dL (ref 6–20)
CHLORIDE: 105 mmol/L (ref 101–111)
CO2: 28 mmol/L (ref 22–32)
Calcium: 9.4 mg/dL (ref 8.9–10.3)
Creatinine, Ser: 0.73 mg/dL (ref 0.44–1.00)
GFR calc Af Amer: >60 mL/min (ref >60)
GLUCOSE: 92 mg/dL (ref 65–99)
POTASSIUM: 4.3 mmol/L (ref 3.5–5.1)
Sodium: 140 mmol/L (ref 135–145)

## 2017-06-18 LAB — SURGICAL PCR SCREEN
MRSA, PCR: NEGATIVE
STAPHYLOCOCCUS AUREUS: NEGATIVE

## 2017-06-18 NOTE — Pre-Procedure Instructions (Signed)
Courtney [name]  06/18/2017      Courtney [name] [address] [place] [address] Phone: [phone] Fax: [phone]  [facility] - [address], [place] [address] Phone: [phone] Fax: [phone]  [facility] [place], [place] [address] [place] [address] Phone: [phone] Fax: [phone]  [facility] [place], [place] - [place] AT [facility] OF [address] [place] [address] Phone: [phone] Fax: [phone]    Your procedure is scheduled on 06-20-2017  Wednesday   Report to [facility] at 6:30 A.M.   Call this number if you have problems the morning of surgery:  [phone]   Remember:  Do not eat food or drink liquids after midnight.   Take these medicines the morning of surgery with A SIP OF WATER   Duloxetine(Cymbalta) Plaquenil Levothyroxine(Synthroid) Loratadine(Claritin) if needed Oxycodone if needed Pregabalin(Lyricia)  STOP ASPIRIN,ANTIINFLAMATORIES (IBUPROFEN,ALEVE,MOTRIN,ADVIL,GOODY'S POWDERS),HERBAL SUPPLEMENTS,FISH OIL,AND VITAMINS 5-7 DAYS PRIOR TO SURGERY    Do not wear jewelry, make-up or nail polish.  Do not wear lotions, powders, or perfumes, or deodorant.  Do not shave 48 hours prior to surgery.  Men may shave face and neck.  Do not bring valuables to the hospital.  [facility] is not responsible for any belongings or valuables.  Contacts, dentures or bridgework may not be worn into surgery.  Leave your suitcase in the car.  After surgery it may be brought to your room.  For patients admitted to the hospital, discharge time will be determined by your treatment team.  Patients discharged the day of surgery will not be allowed to drive home.    Special Instructions: [facility] - Preparing for Surgery  Before surgery, you can play an important role.  Because skin is not sterile, your skin needs to be as free of germs as possible.  You can reduce the number of germs on you skin by washing with CHG (chlorahexidine gluconate) soap before surgery.  CHG is an antiseptic cleaner which kills germs and bonds with the skin to continue killing germs even after washing.  Please DO NOT use if you have an allergy to CHG or antibacterial soaps.  If your skin becomes reddened/irritated stop using the CHG and inform your nurse when you arrive at Short Stay.  Do not shave (including legs and underarms) for at least 48 hours prior to the first CHG shower.  You may shave your face.  Please follow these instructions carefully:   1.  Shower with CHG Soap the night before surgery and the   morning of Surgery.  2.  If you choose to wash your hair, wash your hair first as usual with your normal shampoo.  3.  After you shampoo, rinse your hair and body thoroughly to remove the  Shampoo.  4.  Use CHG as you would any other liquid soap.  You can apply chg directly  to the skin and wash gently with scrungie or a clean washcloth.  5.  Apply the CHG Soap to your body ONLY FROM THE NECK DOWN.   Do not use on open wounds or open sores.  Avoid contact with your eyes,  ears, mouth and genitals (private parts).  Wash genitals (private parts) with  your normal soap.  6.  Wash thoroughly, paying special attention to the area where your surgery will be performed.  7.  Thoroughly rinse your body with warm water from the neck down.  8.  DO NOT shower/wash with your normal soap after using and rinsing o  the CHG Soap.  9.  Pat yourself dry with a clean towel.            10.  Wear clean pajamas.            11.  Place clean sheets on your bed the night of your first shower and do not sleep with pets.  Day of Surgery  Do not apply any lotions/deodorants the morning of surgery.  Please wear clean clothes  to the hospital/surgery center.   Please read over the following fact sheets that you were given. MRSA Information and Surgical Site Infection Prevention

## 2017-06-18 NOTE — Progress Notes (Signed)
Pt. States that surgical order is incomplete with what Dr. Rolena Infante explained to her.  Left message for [facility] @ dr. Rolena Infante office to have someone review the order and add what is necessary.Pt will need to sign consent DOS.

## 2017-06-19 ENCOUNTER — Encounter (HOSPITAL_COMMUNITY): Payer: Self-pay | Admitting: Emergency Medicine

## 2017-06-19 NOTE — Progress Notes (Signed)
Anesthesia Chart Review:  Pt is a 67 year old female scheduled for L4-5 decompression, L5-S1 left laminectomy/foraminotomy on 06/20/2016 with Melina Schools, MD  - PCP is Kelton Pillar, MD  - Endocrinologist is Elayne Snare, MD  - She is not routinely followed by a cardiologist, but she saw Dr. Shelva Majestic on8/17/16 for preoperative evaluation for findings of a mild murmur that showed trivial MR and trivial PR and a bicuspid AV without stenosis or regurgitation.   PMH includes:  Bicuspid aortic valve, hyperlipidemia, hypothyroidism, OSA, GERD. Never smoker. BMI 26.  Medications include: ASA 81mg , Lipitor, iron, Plaquenil, levothyroxine, Prilosec  BP (!) 100/58   Pulse 62   Temp 36.7 C   Resp 18   Ht 5\' 4"  (1.626 m)   Wt 152 lb (68.9 kg)   LMP  (LMP Unknown)   SpO2 98%   BMI 26.09 kg/m   Preoperative labs reviewed.    CXR 05/23/16: No active cardiopulmonary disease  EKG 06/22/16: NSR. Nonspecific ST and T wave abnormality  Echo 02/10/15:  - Left ventricle: The cavity size was normal. Wall thickness was normal. Systolic function was normal. The estimated ejection fraction was in the range of 55% to 60%. Wall motion was normal; there were no regional wall motion abnormalities. - Aortic valve: Bicuspid; mildly thickened leaflets. Transvalvular velocity was within the normal range. There was no stenosis. There was no significant regurgitation.  If no changes, I anticipate pt can proceed with surgery as scheduled.   Willeen Cass, FNP-BC [facility]/Anesthesiology Phone: [phone] 06/19/2017 10:26 AM

## 2017-06-20 ENCOUNTER — Ambulatory Visit (HOSPITAL_COMMUNITY): Payer: Medicare Other

## 2017-06-20 ENCOUNTER — Inpatient Hospital Stay (HOSPITAL_COMMUNITY)
Admission: RE | Admit: 2017-06-20 | Discharge: 2017-06-22 | DRG: 516 | Disposition: A | Discharge status: Home / Self Care | Payer: Medicare Other | Source: Ambulatory Visit | Attending: Orthopedic Surgery | Admitting: Orthopedic Surgery

## 2017-06-20 ENCOUNTER — Ambulatory Visit (HOSPITAL_COMMUNITY): Payer: Medicare Other | Admitting: Certified Registered Nurse Anesthetist

## 2017-06-20 ENCOUNTER — Encounter (HOSPITAL_COMMUNITY)
Admission: RE | Disposition: A | Discharge status: Home / Self Care | Payer: Self-pay | Source: Ambulatory Visit | Attending: Orthopedic Surgery

## 2017-06-20 ENCOUNTER — Ambulatory Visit (HOSPITAL_COMMUNITY): Payer: Medicare Other | Admitting: Emergency Medicine

## 2017-06-20 ENCOUNTER — Encounter (HOSPITAL_COMMUNITY): Payer: Self-pay | Admitting: Certified Registered Nurse Anesthetist

## 2017-06-20 DIAGNOSIS — Z981 Arthrodesis status: Secondary | ICD-10-CM

## 2017-06-20 DIAGNOSIS — M48061 Spinal stenosis, lumbar region without neurogenic claudication: Secondary | ICD-10-CM | POA: Diagnosis present

## 2017-06-20 DIAGNOSIS — M5106 Intervertebral disc disorders with myelopathy, lumbar region: Secondary | ICD-10-CM | POA: Diagnosis not present

## 2017-06-20 DIAGNOSIS — Z91018 Allergy to other foods: Secondary | ICD-10-CM

## 2017-06-20 DIAGNOSIS — M48062 Spinal stenosis, lumbar region with neurogenic claudication: Secondary | ICD-10-CM | POA: Diagnosis not present

## 2017-06-20 DIAGNOSIS — G894 Chronic pain syndrome: Secondary | ICD-10-CM | POA: Diagnosis not present

## 2017-06-20 DIAGNOSIS — Z809 Family history of malignant neoplasm, unspecified: Secondary | ICD-10-CM

## 2017-06-20 DIAGNOSIS — G4730 Sleep apnea, unspecified: Secondary | ICD-10-CM | POA: Diagnosis present

## 2017-06-20 DIAGNOSIS — J45909 Unspecified asthma, uncomplicated: Secondary | ICD-10-CM | POA: Diagnosis present

## 2017-06-20 DIAGNOSIS — K219 Gastro-esophageal reflux disease without esophagitis: Secondary | ICD-10-CM | POA: Diagnosis present

## 2017-06-20 DIAGNOSIS — Z91013 Allergy to seafood: Secondary | ICD-10-CM

## 2017-06-20 DIAGNOSIS — T402X5A Adverse effect of other opioids, initial encounter: Secondary | ICD-10-CM | POA: Diagnosis present

## 2017-06-20 DIAGNOSIS — G629 Polyneuropathy, unspecified: Secondary | ICD-10-CM | POA: Diagnosis present

## 2017-06-20 DIAGNOSIS — M797 Fibromyalgia: Secondary | ICD-10-CM | POA: Diagnosis present

## 2017-06-20 DIAGNOSIS — E785 Hyperlipidemia, unspecified: Secondary | ICD-10-CM | POA: Diagnosis present

## 2017-06-20 DIAGNOSIS — Z7951 Long term (current) use of inhaled steroids: Secondary | ICD-10-CM

## 2017-06-20 DIAGNOSIS — Q231 Congenital insufficiency of aortic valve: Secondary | ICD-10-CM | POA: Diagnosis not present

## 2017-06-20 DIAGNOSIS — E7800 Pure hypercholesterolemia, unspecified: Secondary | ICD-10-CM | POA: Diagnosis present

## 2017-06-20 DIAGNOSIS — M4716 Other spondylosis with myelopathy, lumbar region: Secondary | ICD-10-CM | POA: Diagnosis not present

## 2017-06-20 DIAGNOSIS — Z87891 Personal history of nicotine dependence: Secondary | ICD-10-CM

## 2017-06-20 DIAGNOSIS — K5903 Drug induced constipation: Secondary | ICD-10-CM | POA: Diagnosis not present

## 2017-06-20 DIAGNOSIS — Z8249 Family history of ischemic heart disease and other diseases of the circulatory system: Secondary | ICD-10-CM

## 2017-06-20 DIAGNOSIS — Z86010 Personal history of colonic polyps: Secondary | ICD-10-CM

## 2017-06-20 DIAGNOSIS — Z419 Encounter for procedure for purposes other than remedying health state, unspecified: Secondary | ICD-10-CM

## 2017-06-20 DIAGNOSIS — M7061 Trochanteric bursitis, right hip: Secondary | ICD-10-CM | POA: Diagnosis not present

## 2017-06-20 DIAGNOSIS — F329 Major depressive disorder, single episode, unspecified: Secondary | ICD-10-CM | POA: Diagnosis present

## 2017-06-20 DIAGNOSIS — Z79890 Hormone replacement therapy (postmenopausal): Secondary | ICD-10-CM

## 2017-06-20 DIAGNOSIS — E039 Hypothyroidism, unspecified: Secondary | ICD-10-CM | POA: Diagnosis not present

## 2017-06-20 DIAGNOSIS — Z8261 Family history of arthritis: Secondary | ICD-10-CM

## 2017-06-20 DIAGNOSIS — R011 Cardiac murmur, unspecified: Secondary | ICD-10-CM | POA: Diagnosis present

## 2017-06-20 DIAGNOSIS — Z7982 Long term (current) use of aspirin: Secondary | ICD-10-CM

## 2017-06-20 DIAGNOSIS — M4727 Other spondylosis with radiculopathy, lumbosacral region: Secondary | ICD-10-CM | POA: Diagnosis not present

## 2017-06-20 DIAGNOSIS — M5117 Intervertebral disc disorders with radiculopathy, lumbosacral region: Secondary | ICD-10-CM | POA: Diagnosis not present

## 2017-06-20 DIAGNOSIS — M4807 Spinal stenosis, lumbosacral region: Secondary | ICD-10-CM | POA: Diagnosis not present

## 2017-06-20 DIAGNOSIS — Z9889 Other specified postprocedural states: Secondary | ICD-10-CM

## 2017-06-20 HISTORY — PX: LUMBAR LAMINECTOMY/DECOMPRESSION MICRODISCECTOMY: SHX5026

## 2017-06-20 SURGERY — LUMBAR LAMINECTOMY/DECOMPRESSION MICRODISCECTOMY 2 LEVELS
Anesthesia: General | Site: Back

## 2017-06-20 MED ORDER — LORATADINE 10 MG PO TABS: 10.0000 mg | ORAL_TABLET | Freq: Every day | ORAL | Status: DC | PRN

## 2017-06-20 MED ORDER — MORPHINE SULFATE (PF) 4 MG/ML IV SOLN: 2.0000 mg | INTRAVENOUS | Status: DC | PRN

## 2017-06-20 MED ORDER — ONDANSETRON 4 MG PO TBDP: 4.0000 mg | ORAL_TABLET | Freq: Three times a day (TID) | ORAL | 0 refills | Status: DC | PRN

## 2017-06-20 MED ORDER — EPHEDRINE SULFATE 50 MG/ML IJ SOLN
INTRAMUSCULAR | Status: DC | PRN
  Administered 2017-06-20: 15 mg via INTRAVENOUS

## 2017-06-20 MED ORDER — LIDOCAINE 2% (20 MG/ML) 5 ML SYRINGE
INTRAMUSCULAR | Status: AC
  Filled 2017-06-20: qty 5

## 2017-06-20 MED ORDER — PHENYLEPHRINE HCL 10 MG/ML IJ SOLN
INTRAMUSCULAR | Status: DC | PRN
  Administered 2017-06-20 (×2): 120 ug via INTRAVENOUS
  Administered 2017-06-20 (×2): 80 ug via INTRAVENOUS

## 2017-06-20 MED ORDER — PROPOFOL 500 MG/50ML IV EMUL
INTRAVENOUS | Status: DC | PRN
  Administered 2017-06-20: 25 ug/kg/min via INTRAVENOUS

## 2017-06-20 MED ORDER — ONDANSETRON HCL 4 MG/2ML IJ SOLN
INTRAMUSCULAR | Status: DC | PRN
  Administered 2017-06-20: 4 mg via INTRAVENOUS

## 2017-06-20 MED ORDER — ACETAMINOPHEN 10 MG/ML IV SOLN
INTRAVENOUS | Status: DC | PRN
  Administered 2017-06-20: 1000 mg via INTRAVENOUS

## 2017-06-20 MED ORDER — OXYCODONE HCL 5 MG PO TABS: 5.0000 mg | ORAL_TABLET | Freq: Once | ORAL | Status: DC | PRN

## 2017-06-20 MED ORDER — BUPIVACAINE-EPINEPHRINE (PF) 0.25% -1:200000 IJ SOLN
INTRAMUSCULAR | Status: DC | PRN
  Administered 2017-06-20: 10 mL

## 2017-06-20 MED ORDER — MICROFIBRILLAR COLL HEMOSTAT EX POWD
CUTANEOUS | Status: AC
  Filled 2017-06-20: qty 5

## 2017-06-20 MED ORDER — DULOXETINE HCL 30 MG PO CPEP
60.0000 mg | ORAL_CAPSULE | Freq: Every day | ORAL | Status: DC
  Administered 2017-06-21 – 2017-06-22 (×2): 60 mg via ORAL
  Filled 2017-06-20 (×2): qty 2

## 2017-06-20 MED ORDER — MAGNESIUM CITRATE PO SOLN
1.0000 | Freq: Once | ORAL | Status: DC | PRN
  Filled 2017-06-20: qty 296

## 2017-06-20 MED ORDER — HYDROMORPHONE HCL 1 MG/ML IJ SOLN
INTRAMUSCULAR | Status: AC
  Filled 2017-06-20: qty 1

## 2017-06-20 MED ORDER — FLUTICASONE PROPIONATE 50 MCG/ACT NA SUSP
2.0000 | Freq: Every day | NASAL | Status: DC
Start: 2017-06-20 — End: 2017-06-22
  Filled 2017-06-20: qty 16

## 2017-06-20 MED ORDER — OXYCODONE-ACETAMINOPHEN 10-325 MG PO TABS: 1.0000 | ORAL_TABLET | ORAL | 0 refills | Status: AC | PRN

## 2017-06-20 MED ORDER — OXYCODONE HCL 5 MG PO TABS
5.0000 mg | ORAL_TABLET | ORAL | Status: DC | PRN
  Administered 2017-06-21 – 2017-06-22 (×2): 5 mg via ORAL
  Filled 2017-06-20 (×4): qty 1

## 2017-06-20 MED ORDER — CEFAZOLIN SODIUM-DEXTROSE 2-4 GM/100ML-% IV SOLN
2.0000 g | INTRAVENOUS | Status: AC
  Administered 2017-06-20: 2 g via INTRAVENOUS

## 2017-06-20 MED ORDER — ALBUTEROL SULFATE HFA 108 (90 BASE) MCG/ACT IN AERS
INHALATION_SPRAY | RESPIRATORY_TRACT | Status: DC | PRN
  Administered 2017-06-20: 2 via RESPIRATORY_TRACT

## 2017-06-20 MED ORDER — FENTANYL CITRATE (PF) 250 MCG/5ML IJ SOLN
INTRAMUSCULAR | Status: DC | PRN
  Administered 2017-06-20: 50 ug via INTRAVENOUS
  Administered 2017-06-20: 150 ug via INTRAVENOUS
  Administered 2017-06-20: 50 ug via INTRAVENOUS

## 2017-06-20 MED ORDER — ONDANSETRON HCL 4 MG PO TABS: 4.0000 mg | ORAL_TABLET | Freq: Four times a day (QID) | ORAL | Status: DC | PRN

## 2017-06-20 MED ORDER — HYDROMORPHONE HCL 1 MG/ML IJ SOLN
0.2500 mg | INTRAMUSCULAR | Status: DC | PRN
  Administered 2017-06-20 (×2): 0.5 mg via INTRAVENOUS

## 2017-06-20 MED ORDER — DEXAMETHASONE SODIUM PHOSPHATE 10 MG/ML IJ SOLN
INTRAMUSCULAR | Status: AC
  Filled 2017-06-20: qty 1

## 2017-06-20 MED ORDER — ONDANSETRON HCL 4 MG/2ML IJ SOLN: 4.0000 mg | Freq: Four times a day (QID) | INTRAMUSCULAR | Status: DC | PRN

## 2017-06-20 MED ORDER — SUGAMMADEX SODIUM 200 MG/2ML IV SOLN
INTRAVENOUS | Status: AC
  Filled 2017-06-20: qty 2

## 2017-06-20 MED ORDER — OXYCODONE HCL 5 MG/5ML PO SOLN: 5.0000 mg | Freq: Once | ORAL | Status: DC | PRN

## 2017-06-20 MED ORDER — GLYCOPYRROLATE 0.2 MG/ML IJ SOLN
INTRAMUSCULAR | Status: DC | PRN
  Administered 2017-06-20: 0.2 mg via INTRAVENOUS

## 2017-06-20 MED ORDER — MIDAZOLAM HCL 2 MG/2ML IJ SOLN
INTRAMUSCULAR | Status: AC
  Filled 2017-06-20: qty 2

## 2017-06-20 MED ORDER — ROCURONIUM BROMIDE 100 MG/10ML IV SOLN
INTRAVENOUS | Status: DC | PRN
  Administered 2017-06-20 (×2): 50 mg via INTRAVENOUS

## 2017-06-20 MED ORDER — ARTIFICIAL TEARS OPHTHALMIC OINT
TOPICAL_OINTMENT | OPHTHALMIC | Status: AC
  Filled 2017-06-20: qty 3.5

## 2017-06-20 MED ORDER — DEXAMETHASONE SODIUM PHOSPHATE 10 MG/ML IJ SOLN
INTRAMUSCULAR | Status: DC | PRN
  Administered 2017-06-20: 10 mg via INTRAVENOUS

## 2017-06-20 MED ORDER — CEFAZOLIN SODIUM-DEXTROSE 2-4 GM/100ML-% IV SOLN
2.0000 g | Freq: Three times a day (TID) | INTRAVENOUS | Status: AC
  Administered 2017-06-20 – 2017-06-21 (×2): 2 g via INTRAVENOUS
  Filled 2017-06-20: qty 100

## 2017-06-20 MED ORDER — LACTATED RINGERS IV SOLN
INTRAVENOUS | Status: DC
  Administered 2017-06-20: 16:00:00 via INTRAVENOUS

## 2017-06-20 MED ORDER — ACETAMINOPHEN 650 MG RE SUPP: 650.0000 mg | RECTAL | Status: DC | PRN

## 2017-06-20 MED ORDER — SUCCINYLCHOLINE CHLORIDE 200 MG/10ML IV SOSY
PREFILLED_SYRINGE | INTRAVENOUS | Status: AC
  Filled 2017-06-20: qty 10

## 2017-06-20 MED ORDER — METHOCARBAMOL 1000 MG/10ML IJ SOLN
500.0000 mg | Freq: Four times a day (QID) | INTRAVENOUS | Status: DC | PRN
  Filled 2017-06-20: qty 5

## 2017-06-20 MED ORDER — SODIUM CHLORIDE 0.9% FLUSH
3.0000 mL | Freq: Two times a day (BID) | INTRAVENOUS | Status: DC
  Administered 2017-06-20: 3 mL via INTRAVENOUS

## 2017-06-20 MED ORDER — ONDANSETRON HCL 4 MG/2ML IJ SOLN
INTRAMUSCULAR | Status: AC
  Filled 2017-06-20: qty 2

## 2017-06-20 MED ORDER — 0.9 % SODIUM CHLORIDE (POUR BTL) OPTIME
TOPICAL | Status: DC | PRN
  Administered 2017-06-20 (×2): 1000 mL

## 2017-06-20 MED ORDER — ACETAMINOPHEN 325 MG PO TABS
650.0000 mg | ORAL_TABLET | ORAL | Status: DC | PRN
  Administered 2017-06-21 – 2017-06-22 (×2): 650 mg via ORAL
  Filled 2017-06-20 (×2): qty 2

## 2017-06-20 MED ORDER — ATORVASTATIN CALCIUM 20 MG PO TABS
40.0000 mg | ORAL_TABLET | Freq: Every evening | ORAL | Status: DC
  Administered 2017-06-20 – 2017-06-21 (×2): 40 mg via ORAL
  Filled 2017-06-20 (×2): qty 2

## 2017-06-20 MED ORDER — DOCUSATE SODIUM 100 MG PO CAPS
100.0000 mg | ORAL_CAPSULE | Freq: Two times a day (BID) | ORAL | Status: DC
  Administered 2017-06-20 – 2017-06-22 (×4): 100 mg via ORAL
  Filled 2017-06-20 (×4): qty 1

## 2017-06-20 MED ORDER — METHOCARBAMOL 500 MG PO TABS
500.0000 mg | ORAL_TABLET | Freq: Four times a day (QID) | ORAL | Status: DC | PRN
  Administered 2017-06-20 – 2017-06-22 (×7): 500 mg via ORAL
  Filled 2017-06-20 (×7): qty 1

## 2017-06-20 MED ORDER — ACETAMINOPHEN 10 MG/ML IV SOLN
INTRAVENOUS | Status: AC
  Filled 2017-06-20: qty 100

## 2017-06-20 MED ORDER — SUGAMMADEX SODIUM 200 MG/2ML IV SOLN
INTRAVENOUS | Status: DC | PRN
  Administered 2017-06-20: 200 mg via INTRAVENOUS

## 2017-06-20 MED ORDER — ALBUMIN HUMAN 5 % IV SOLN
INTRAVENOUS | Status: DC | PRN
  Administered 2017-06-20: 09:00:00 via INTRAVENOUS

## 2017-06-20 MED ORDER — METHOCARBAMOL 500 MG PO TABS: 500.0000 mg | ORAL_TABLET | Freq: Three times a day (TID) | ORAL | 0 refills | Status: AC

## 2017-06-20 MED ORDER — ALBUTEROL SULFATE HFA 108 (90 BASE) MCG/ACT IN AERS
INHALATION_SPRAY | RESPIRATORY_TRACT | Status: AC
  Filled 2017-06-20: qty 6.7

## 2017-06-20 MED ORDER — MENTHOL 3 MG MT LOZG: 1.0000 | LOZENGE | OROMUCOSAL | Status: DC | PRN

## 2017-06-20 MED ORDER — PHENYLEPHRINE HCL 10 MG/ML IJ SOLN
INTRAVENOUS | Status: DC | PRN
  Administered 2017-06-20: 50 ug/min via INTRAVENOUS

## 2017-06-20 MED ORDER — HEMOSTATIC AGENTS (NO CHARGE) OPTIME
TOPICAL | Status: DC | PRN
  Administered 2017-06-20 (×3): 1 via TOPICAL

## 2017-06-20 MED ORDER — POLYETHYLENE GLYCOL 3350 17 G PO PACK: 17.0000 g | PACK | Freq: Every day | ORAL | Status: DC | PRN

## 2017-06-20 MED ORDER — ROCURONIUM BROMIDE 10 MG/ML (PF) SYRINGE
PREFILLED_SYRINGE | INTRAVENOUS | Status: AC
  Filled 2017-06-20: qty 5

## 2017-06-20 MED ORDER — PROPOFOL 10 MG/ML IV BOLUS
INTRAVENOUS | Status: DC | PRN
  Administered 2017-06-20: 140 mg via INTRAVENOUS

## 2017-06-20 MED ORDER — PREGABALIN 50 MG PO CAPS
200.0000 mg | ORAL_CAPSULE | Freq: Two times a day (BID) | ORAL | Status: DC
  Administered 2017-06-20 – 2017-06-22 (×4): 200 mg via ORAL
  Filled 2017-06-20 (×4): qty 1

## 2017-06-20 MED ORDER — FENTANYL CITRATE (PF) 250 MCG/5ML IJ SOLN
INTRAMUSCULAR | Status: AC
  Filled 2017-06-20: qty 5

## 2017-06-20 MED ORDER — BUPIVACAINE-EPINEPHRINE (PF) 0.25% -1:200000 IJ SOLN
INTRAMUSCULAR | Status: AC
  Filled 2017-06-20: qty 30

## 2017-06-20 MED ORDER — THROMBIN (RECOMBINANT) 5000 UNITS EX SOLR
CUTANEOUS | Status: AC
  Filled 2017-06-20: qty 5000

## 2017-06-20 MED ORDER — PANTOPRAZOLE SODIUM 40 MG PO TBEC
40.0000 mg | DELAYED_RELEASE_TABLET | Freq: Every day | ORAL | Status: DC
  Administered 2017-06-20 – 2017-06-22 (×2): 40 mg via ORAL
  Filled 2017-06-20 (×3): qty 1

## 2017-06-20 MED ORDER — LEVOTHYROXINE SODIUM 75 MCG PO TABS
37.5000 ug | ORAL_TABLET | Freq: Every day | ORAL | Status: DC
  Administered 2017-06-21 – 2017-06-22 (×2): 37.5 ug via ORAL
  Filled 2017-06-20 (×2): qty 0.5

## 2017-06-20 MED ORDER — METOPROLOL TARTRATE 5 MG/5ML IV SOLN
INTRAVENOUS | Status: AC
  Filled 2017-06-20: qty 5

## 2017-06-20 MED ORDER — PHENOL 1.4 % MT LIQD: 1.0000 | OROMUCOSAL | Status: DC | PRN

## 2017-06-20 MED ORDER — OXYCODONE HCL 5 MG PO TABS
10.0000 mg | ORAL_TABLET | ORAL | Status: DC | PRN
  Administered 2017-06-20 – 2017-06-22 (×10): 10 mg via ORAL
  Filled 2017-06-20 (×10): qty 2

## 2017-06-20 MED ORDER — SODIUM CHLORIDE 0.9 % IV SOLN: 250.0000 mL | INTRAVENOUS | Status: DC

## 2017-06-20 MED ORDER — ARTIFICIAL TEARS OPHTHALMIC OINT
TOPICAL_OINTMENT | OPHTHALMIC | Status: DC | PRN
  Administered 2017-06-20: 1 via OPHTHALMIC

## 2017-06-20 MED ORDER — SODIUM CHLORIDE 0.9% FLUSH: 3.0000 mL | INTRAVENOUS | Status: DC | PRN

## 2017-06-20 MED ORDER — CEFAZOLIN SODIUM-DEXTROSE 2-4 GM/100ML-% IV SOLN
INTRAVENOUS | Status: AC
  Filled 2017-06-20: qty 100

## 2017-06-20 MED ORDER — THROMBIN (RECOMBINANT) 5000 UNITS EX SOLR
CUTANEOUS | Status: DC | PRN
  Administered 2017-06-20: 5000 [IU] via TOPICAL

## 2017-06-20 MED ORDER — LACTATED RINGERS IV SOLN
INTRAVENOUS | Status: DC | PRN
  Administered 2017-06-20 (×2): via INTRAVENOUS

## 2017-06-20 MED ORDER — CEFAZOLIN SODIUM-DEXTROSE 2-4 GM/100ML-% IV SOLN: 2.0000 g | INTRAVENOUS | Status: DC

## 2017-06-20 MED ORDER — LIDOCAINE HCL (CARDIAC) 20 MG/ML IV SOLN
INTRAVENOUS | Status: DC | PRN
  Administered 2017-06-20: 60 mg via INTRATRACHEAL

## 2017-06-20 SURGICAL SUPPLY — 62 items
BNDG GAUZE ELAST 4 BULKY (GAUZE/BANDAGES/DRESSINGS) ×3 IMPLANT
BUR EGG ELITE 4.0 (BURR) IMPLANT
BUR EGG ELITE 4.0MM (BURR)
CLOSURE WOUND 1/2 X4 (GAUZE/BANDAGES/DRESSINGS) ×1
CORDS BIPOLAR (ELECTRODE) ×3 IMPLANT
DRAIN CHANNEL 15F RND FF W/TCR (WOUND CARE) ×3 IMPLANT
DRAPE C-ARM 42X72 X-RAY (DRAPES) IMPLANT
DRAPE POUCH INSTRU U-SHP 10X18 (DRAPES) ×3 IMPLANT
DRAPE SURG 17X11 SM STRL (DRAPES) ×3 IMPLANT
DRAPE U-SHAPE 47X51 STRL (DRAPES) ×3 IMPLANT
DRSG AQUACEL AG ADV 3.5X 6 (GAUZE/BANDAGES/DRESSINGS) ×3 IMPLANT
DRSG OPSITE POSTOP 4X6 (GAUZE/BANDAGES/DRESSINGS) ×3 IMPLANT
DURAPREP 26ML APPLICATOR (WOUND CARE) ×3 IMPLANT
ELECT BLADE 4.0 EZ CLEAN MEGAD (MISCELLANEOUS) ×3
ELECT CAUTERY BLADE 6.4 (BLADE) ×3 IMPLANT
ELECT PENCIL ROCKER SW 15FT (MISCELLANEOUS) ×3 IMPLANT
ELECT REM PT RETURN 9FT ADLT (ELECTROSURGICAL) ×3
ELECTRODE BLDE 4.0 EZ CLN MEGD (MISCELLANEOUS) ×1 IMPLANT
ELECTRODE REM PT RTRN 9FT ADLT (ELECTROSURGICAL) ×1 IMPLANT
EVACUATOR SILICONE 100CC (DRAIN) ×3 IMPLANT
FLOSEAL (HEMOSTASIS) ×3 IMPLANT
GAUZE SPONGE 4X4 12PLY STRL (GAUZE/BANDAGES/DRESSINGS) ×3 IMPLANT
GLOVE BIO SURGEON STRL SZ 6.5 (GLOVE) ×2 IMPLANT
GLOVE BIO SURGEONS STRL SZ 6.5 (GLOVE) ×1
GLOVE BIOGEL PI IND STRL 6.5 (GLOVE) ×2 IMPLANT
GLOVE BIOGEL PI IND STRL 8.5 (GLOVE) ×1 IMPLANT
GLOVE BIOGEL PI INDICATOR 6.5 (GLOVE) ×4
GLOVE BIOGEL PI INDICATOR 8.5 (GLOVE) ×2
GLOVE SS BIOGEL STRL SZ 8.5 (GLOVE) ×1 IMPLANT
GLOVE SUPERSENSE BIOGEL SZ 8.5 (GLOVE) ×2
GOWN STRL REUS W/ TWL LRG LVL3 (GOWN DISPOSABLE) ×1 IMPLANT
GOWN STRL REUS W/TWL 2XL LVL3 (GOWN DISPOSABLE) ×6 IMPLANT
GOWN STRL REUS W/TWL LRG LVL3 (GOWN DISPOSABLE) ×2
KIT BASIN OR (CUSTOM PROCEDURE TRAY) ×3 IMPLANT
NEEDLE 22X1 1/2 (OR ONLY) (NEEDLE) ×3 IMPLANT
NEEDLE SPNL 18GX3.5 QUINCKE PK (NEEDLE) ×6 IMPLANT
NS IRRIG 1000ML POUR BTL (IV SOLUTION) ×3 IMPLANT
PACK LAMINECTOMY ORTHO (CUSTOM PROCEDURE TRAY) ×3 IMPLANT
PACK UNIVERSAL I (CUSTOM PROCEDURE TRAY) ×3 IMPLANT
PATTIES SURGICAL .5 X.5 (GAUZE/BANDAGES/DRESSINGS) ×3 IMPLANT
PATTIES SURGICAL .5 X1 (DISPOSABLE) ×3 IMPLANT
SPONGE LAP 4X18 X RAY DECT (DISPOSABLE) ×12 IMPLANT
SPONGE SURGIFOAM ABS GEL 100 (HEMOSTASIS) ×3 IMPLANT
STAPLER VISISTAT 35W (STAPLE) IMPLANT
STRIP CLOSURE SKIN 1/2X4 (GAUZE/BANDAGES/DRESSINGS) ×2 IMPLANT
SURGIFLO W/THROMBIN 8M KIT (HEMOSTASIS) IMPLANT
SUT BONE WAX W31G (SUTURE) ×3 IMPLANT
SUT MON AB 3-0 SH 27 (SUTURE) ×2
SUT MON AB 3-0 SH27 (SUTURE) ×1 IMPLANT
SUT SILK 2 0 PERMA HAND 18 BK (SUTURE) ×3 IMPLANT
SUT VIC AB 1 CT1 18XCR BRD 8 (SUTURE) ×2 IMPLANT
SUT VIC AB 1 CT1 27 (SUTURE) ×4
SUT VIC AB 1 CT1 27XBRD ANTBC (SUTURE) ×2 IMPLANT
SUT VIC AB 1 CT1 8-18 (SUTURE) ×4
SUT VIC AB 2-0 CT1 18 (SUTURE) ×6 IMPLANT
SUT VICRYL 0 UR6 27IN ABS (SUTURE) ×3 IMPLANT
SYR BULB IRRIGATION 50ML (SYRINGE) ×3 IMPLANT
SYR CONTROL 10ML LL (SYRINGE) ×3 IMPLANT
TOWEL OR 17X26 10 PK STRL BLUE (TOWEL DISPOSABLE) ×6 IMPLANT
TRAY FOLEY W/METER SILVER 16FR (SET/KITS/TRAYS/PACK) ×3 IMPLANT
WATER STERILE IRR 1000ML POUR (IV SOLUTION) IMPLANT
YANKAUER SUCT BULB TIP NO VENT (SUCTIONS) ×3 IMPLANT

## 2017-06-20 NOTE — Anesthesia Procedure Notes (Signed)
Procedure Name: Intubation Date/Time: 06/20/2017 8:35 AM Performed by: Albertha Ghee, MD Pre-anesthesia Checklist: Patient identified, Emergency Drugs available, Suction available and Patient being monitored Patient Re-evaluated:Patient Re-evaluated prior to induction Oxygen Delivery Method: Circle system utilized Preoxygenation: Pre-oxygenation with 100% oxygen Induction Type: IV induction Ventilation: Mask ventilation without difficulty Laryngoscope Size: Mac and 3 Grade View: Grade II Tube type: Oral Tube size: 7.0 mm Number of attempts: 1 Airway Equipment and Method: Stylet Placement Confirmation: ETT inserted through vocal cords under direct vision,  positive ETCO2 and breath sounds checked- equal and bilateral Secured at: 22 cm Dental Injury: Teeth and Oropharynx as per pre-operative assessment

## 2017-06-20 NOTE — Transfer of Care (Signed)
Immediate Anesthesia Transfer of Care Note  Patient: Courtney [name]  Procedure(s) Performed: L4-5 decompression, L5-S1 left laminotomy/foraminotomy (N/A Back)  Patient Location: PACU  Anesthesia Type:General  Level of Consciousness: awake, alert  and patient cooperative  Airway & Oxygen Therapy: Patient Spontanous Breathing and Patient connected to nasal cannula oxygen  Post-op Assessment: Report given to RN, Post -op Vital signs reviewed and stable, Patient moving all extremities X 4 and Patient able to stick tongue midline  Post vital signs: Reviewed and stable  Last Vitals:  Vitals:   06/20/17 0715  BP: (!) 115/54  Pulse: 64  Resp: 18  Temp: 36.8 C  SpO2: 95%    Last Pain:  Vitals:   06/20/17 0715  TempSrc: Oral         Complications: No apparent anesthesia complications

## 2017-06-20 NOTE — Op Note (Signed)
Operative report.  Preoperative diagnosis.  Lumbar spinal stenosis L4-5 L5-S1.  Postoperative diagnosis.  Same.  Operative procedure 1 L4-5 lumbar central and bilateral hemilaminotomies for lateral recess decompression .  L5-S1 central and left laminectomy, and foraminotomy (partial removal of L5 pars).    First assistant [facility]  Complications.  None.  Indications.  This is a 67 year old woman has had a previous L3-4 decompression approximately 2-1/2 years ago.  Patient has done well until recently when she started noting increasing radicular left leg pain.  Imaging studies confirmed significant left-sided foraminal stenosis at L5-S1 and significant central and lateral recess stenosis at L4-5.  Attempts at conservative management had failed to alleviate her symptoms so we elected to proceed with surgery.  All appropriate risks benefits and alternatives were discussed with the patient and consent was obtained.  Operative note.  Patient was brought the operating room placed upon the operating table.  After successful induction of general anesthesia and endotracheally patient teds SCDs and a Foley were inserted.  Patient was turned prone onto the Wilson frame and all bony prominences well-padded.  The back was then prepped and draped in a standard fashion.  Timeout was taken to confirm patient procedure and all other important data.  Once this was completed we move forward with the surgery.  2 needles were placed into the back and an x-ray was taken to localize her skin incision.  I infiltrated the marked out area with quarter percent Marcaine with epinephrine and then made a midline incision which began at the inferior aspect of her previous surgical incision.  Sharp dissection was carried out down to the deep fascia and the deep fascia was sharply incised.  I then carefully stripped the paraspinal muscles off of the spinous process and lamina of L4 and L5 and a portion of that of S1.  Care was  taken to ensure that I do not plunge.  The scar tissue and soft tissue were resected and retracted with a self-retaining retractor.  A Penfield 4 was then placed underneath the L5 lamina and a second x-ray was taken confirming I was at the appropriate level.  Once this was done I removed for decompression.  A double-action Leksell rongeur was used to remove the spinous process of L5 and the inferior third of the L4 spinous process.  I then used a 3 mm Kerrison rongeur to decompress the central area at L5-S1 by resecting the central portion of the L5 lamina.  I then continued my dissection into the left lateral corner performing a complete left L5 laminectomy.  The patient had the majority of her neurologic pain on the right side and the right L5 foramen was significantly compressed and so I elected to leave the right side alone and move forward with only the left-sided decompression at this level.  I continued my dissection into the lateral recesses and until I could palpate the lateral aspect of the S1 pedicle.  I then continued my decompression and to the medial aspect of the S1 foramen.  I then went superiorly and continued resecting the medial aspect of the facet complex until I could visualize the L5 nerve root once I had the L5 nerve root and insight I then continued out into the foramen removing the medial aspect of the pars.  Once the medial portion was removed I could now trace the L5 nerve root into the foramen freely mobilize I continued my decompression superiorly bilateral hemilaminotomies of L4.  This allowed me  to decompress the lateral recess clearly to the level just above the L4-5 disc space.  Based on the preoperative MRI and the majority of the spinal stenosis at L4-5 was at the level of the disc space.  At this point I could freely pass my [facility] elevator superiorly in the lateral recess and centrally as well as at the L4 foramen without any difficulty.  Also palpated down the lateral recess  on both sides without any difficulty.  I could now palpate and visualize the L5 and S1 nerve roots ensuring that they were adequately decompressed on that left side.  At this point had an adequate decompression.  I irrigated the wound copiously with normal saline and bipolar cautery FloSeal and Avitene I was able to obtain and maintain hemostasis I did place a deep drain in order to aid in maintaining hemostasis.  After copious irrigation and confirming once again an adequate decompression closed the deep fascia with interrupted #1 Vicryl sutures.  Fascia was closed with interrupted 2-0 Vicryl sutures and the skin was closed with 3-0 Monocryl.  Steri-Strips dry dressing were applied.  The drain was also stitched in with a 0 silk stitch.  Dry dressings were applied and the patient was ultimately extubated transferred the PACU without incident.  The end of the case all needle sponge counts were correct.  There are no adverse intraoperative events.

## 2017-06-20 NOTE — Anesthesia Preprocedure Evaluation (Signed)
Anesthesia Evaluation  Patient identified by MRN, date of birth, ID band Patient awake    Reviewed: Allergy & Precautions, H&P , NPO status , Patient's Chart, lab work & pertinent test results  Airway Mallampati: II   Neck ROM: full    Dental   Pulmonary asthma , sleep apnea ,    breath sounds clear to auscultation       Cardiovascular  Rhythm:regular Rate:Normal  Bicuspid aortic valve with no stenosis   Neuro/Psych PSYCHIATRIC DISORDERS Depression  Neuromuscular disease    GI/Hepatic GERD  ,  Endo/Other  Hypothyroidism   Renal/GU      Musculoskeletal  (+) Arthritis , Fibromyalgia -  Abdominal   Peds  Hematology   Anesthesia Other Findings   Reproductive/Obstetrics                             Anesthesia Physical Anesthesia Plan  ASA: II  Anesthesia Plan: General   Post-op Pain Management:    Induction: Intravenous  PONV Risk Score and Plan: 3 and Ondansetron, Dexamethasone, Midazolam and Treatment may vary due to age or medical condition  Airway Management Planned: Oral ETT  Additional Equipment:   Intra-op Plan:   Post-operative Plan: Extubation in OR  Informed Consent: I have reviewed the patients History and Physical, chart, labs and discussed the procedure including the risks, benefits and alternatives for the proposed anesthesia with the patient or authorized representative who has indicated his/her understanding and acceptance.     Plan Discussed with: CRNA, Anesthesiologist and Surgeon  Anesthesia Plan Comments:         Anesthesia Quick Evaluation

## 2017-06-20 NOTE — Evaluation (Signed)
Physical Therapy Evaluation Patient Details Name: Courtney [name] MRN: [id number] DOB: 16-May-1951 Today's Date: 06/20/2017   History of Present Illness  Pt is a 67 y/o female s/p L4-5 decompression and L5-S1 laminectomy. PMH includes asthma, OSA, and fibromyalgia.   Clinical Impression  Patient is s/p above surgery resulting in the deficits listed below (see PT Problem List). PTA, pt was independent with functional mobility and reports using a cane on her "bad days." Upon eval, pt presenting with unsteadiness and post op pain. Also presenting with RLE numbness and weakness. Required use of cane and min guard to min A for functional mobility. Pt with LOB X 1 this session requiring min A for steadying. Reports her son will be able to assist at d/c until Tuesday and has all necessary DME.  Patient will benefit from skilled PT to increase their independence and safety with mobility (while adhering to their precautions) to allow discharge to the venue listed below. Will continue to follow acutely.      Follow Up Recommendations No PT follow up;Supervision for mobility/OOB    Equipment Recommendations  None recommended by PT    Recommendations for Other Services       Precautions / Restrictions Precautions Precautions: Back Precaution Booklet Issued: Yes (comment) Precaution Comments: Reviewed back precautions with pt.  Required Braces or Orthoses: Spinal Brace Spinal Brace: Lumbar corset;Applied in sitting position Restrictions Weight Bearing Restrictions: No      Mobility  Bed Mobility Overal bed mobility: Needs Assistance Bed Mobility: Rolling;Sidelying to Sit;Sit to Sidelying Rolling: Supervision Sidelying to sit: Supervision     Sit to sidelying: Min assist General bed mobility comments: Supervision to ensure log roll technique. Required min A for RLE lift assist for return to sidelying.   Transfers Overall transfer level: Needs assistance Equipment used: Straight  cane Transfers: Sit to/from Stand Sit to Stand: Min assist         General transfer comment: Min A for lift assist and steadying. Verbal cues to power through LEs.   Ambulation/Gait Ambulation/Gait assistance: Min guard;Min assist Ambulation Distance (Feet): 300 Feet Assistive device: Straight cane Gait Pattern/deviations: Step-through pattern;Decreased stride length;Drifts right/left Gait velocity: Decreased  Gait velocity interpretation: Below normal speed for age/gender General Gait Details: Slow, guarded gait. Unsteady and required min guard assist for steadying assist with use of cane. Demonstrated on LOB requiring min A for steadying assist. Educated about use of RW at home to increase steadiness if pt continued to feel unsteady.   Stairs            Wheelchair Mobility    Modified Rankin (Stroke Patients Only)       Balance Overall balance assessment: Needs assistance Sitting-balance support: No upper extremity supported;Feet supported Sitting balance-Leahy Scale: Good     Standing balance support: Single extremity supported;During functional activity Standing balance-Leahy Scale: Poor Standing balance comment: Reliant on UE support and external support.                              Pertinent Vitals/Pain Pain Assessment: 0-10 Pain Score: 3  Pain Location: back  Pain Descriptors / Indicators: Aching;Operative site guarding Pain Intervention(s): Limited activity within patient's tolerance;Monitored during session;Repositioned    Home Living Family/patient expects to be discharged to:: Private residence Living Arrangements: Alone Available Help at Discharge: Family;Available 24 hours/day(until tuesday ) Type of Home: House Home Access: Stairs to enter Entrance Stairs-Rails: Left Entrance Stairs-Number of Steps: 3-4 Home  Layout: Two level;Laundry or work area in [place]: Environmental consultant - 2 wheels;Cane - single point;Bedside  commode;Shower seat      Prior Function Level of Independence: Independent with assistive device(s)         Comments: Reports independence with mobility unless she was having a bad day. Then she would use her cane.      Hand Dominance        Extremity/Trunk Assessment   Upper Extremity Assessment Upper Extremity Assessment: Defer to OT evaluation    Lower Extremity Assessment Lower Extremity Assessment: RLE deficits/detail RLE Deficits / Details: Numbness in RLE at baseline. Reports it feels better after surgery.     Cervical / Trunk Assessment Cervical / Trunk Assessment: Other exceptions Cervical / Trunk Exceptions: s/p laminectomy and decompression.   Communication   Communication: No difficulties  Cognition Arousal/Alertness: Awake/alert Behavior During Therapy: WFL for tasks assessed/performed Overall Cognitive Status: Within Functional Limits for tasks assessed                                        General Comments General comments (skin integrity, edema, etc.): Educated about generalized walking program to perform at home.     Exercises     Assessment/Plan    PT Assessment Patient needs continued PT services  PT Problem List Decreased strength;Decreased balance;Decreased mobility;Decreased knowledge of use of DME;Pain;Decreased knowledge of precautions       PT Treatment Interventions DME instruction;Gait training;Stair training;Functional mobility training;Therapeutic activities;Balance training;Therapeutic exercise;Neuromuscular re-education;Patient/family education    PT Goals (Current goals can be found in the Care Plan section)  Acute Rehab PT Goals Patient Stated Goal: to go home  PT Goal Formulation: With patient Time For Goal Achievement: 07/04/17 Potential to Achieve Goals: Good    Frequency Min 5X/week   Barriers to discharge        Co-evaluation               AM-PAC PT "6 Clicks" Daily Activity  Outcome  Measure Difficulty turning over in bed (including adjusting bedclothes, sheets and blankets)?: A Little Difficulty moving from lying on back to sitting on the side of the bed? : A Little Difficulty sitting down on and standing up from a chair with arms (e.g., wheelchair, bedside commode, etc,.)?: Unable Help needed moving to and from a bed to chair (including a wheelchair)?: A Little Help needed walking in hospital room?: A Little Help needed climbing 3-5 steps with a railing? : A Lot 6 Click Score: 15    End of Session Equipment Utilized During Treatment: Gait belt;Back brace Activity Tolerance: Patient tolerated treatment well Patient left: in bed;with call bell/phone within reach;with nursing/sitter in room Nurse Communication: Mobility status;Patient requests pain meds PT Visit Diagnosis: Unsteadiness on feet (R26.81);Other abnormalities of gait and mobility (R26.89);Pain Pain - part of body: (back )    Time: 0093-8182 PT Time Calculation (min) (ACUTE ONLY): 36 min   Charges:   PT Evaluation $PT Eval Low Complexity: 1 Low PT Treatments $Gait Training: 8-22 mins   PT G Codes:        Leighton Ruff, PT, DPT  Acute Rehabilitation Services  Pager: [phone]   Rudean Hitt 06/20/2017, 3:55 PM

## 2017-06-20 NOTE — H&P (Signed)
History of Present Illness  The patient is a 67 year old female who presents for a Follow-up for H & P. The patient is scheduled for a L4-5 Lumbar Decompression, L5-S1 Laminectomy/Foraminotomy to be performed by Dr. Duane Lope D. Rolena Infante, MD at [facility] on 06/20/2017 . Please see the hospital record for complete dictated history and physical. The pt has erosive osteoartritis. The pt reports pre-diabetes on no meds. Controlled on diet. Pt is in pain managment with Dr. Nelva Bush.  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)  Lumbosacral DDD (M51.37)  Sacroiliac joint pain (M53.3)  Lumbar spine pain (M54.5)  Spinal stenosis of lumbar region without neurogenic claudication (M48.061)  Spondylolisthesis of lumbar region (M43.16)  Constipation due to opioid therapy (K59.03)  Trochanteric bursitis, right hip (M70.61)  Chronic pain syndrome (G89.4)  Cervical pain (M54.2)  Snapping hip, right (M24.851)  Problems Reconciled   Allergies  Fish Meal *CHEMICALS*  fish that swim Nuts  tree nuts and ground nuts (except almonds) Allergies Reconciled   Family History  Cancer  mother Heart Disease  mother Osteoarthritis  mother Rheumatoid Arthritis  father Severe allergy  mother and father First Degree Relatives   Social History  Tobacco use  former smoker Tobacco / smoke exposure  no Alcohol use  current drinker; drinks wine; only occasionally per week Drug/Alcohol Rehab (Previously)  no Marital status  married Children  2 Current work status  working full time Drug/Alcohol Rehab (Currently)  no Number of flights of stairs before winded  2-3 Living situation  live with spouse Pain Contract  no Exercise  Exercises rarely;  does running / walking Illicit drug use  no  Medication History Levothyroxine Sodium (25MCG Tablet, Oral) Active. Omeprazole (20MG  Capsule DR, Oral) Active. (prn) Atorvastatin Calcium (40MG  Tablet, Oral) Active. Fish Oil (1000MG  Capsule, Oral) Active. (QD) Vitamin C (500MG  Capsule, Oral) Active. (QD) Vitamin D-1000 Max St (1000UNIT Tablet, Oral) Active. (QD) Vitamin E (1000UNIT Capsule, Oral) Active. (QOD) DULoxetine HCl (60MG  Capsule DR Part, Oral) Active. Flonase Allergy Relief (50MCG/ACT Suspension, Nasal) Active. (QD) TraZODone HCl (50MG  Tablet, Oral) Active. (prn HS) Epi E-Z Pen (1:1000 Device, Injection as needed) Active. Lyrica (200MG  Capsule, 2 Oral daily) Active. (5 X DAY RX'D BY PAIN MANAGEMENT) OxyCODONE HCl (7.5MG  Tab Abuse Deter, 1 (one and a half) Oral three times daily, as needed, Taken starting 05/09/2017) Inactive. Medications Reconciled  Past Surgical History  Anal Fissure Repair  Colon Polyp Removal - Colonoscopy  Inguinal Hernia Repair  open: bilateral Sinus Surgery  Straighten Nasal Septum  Tonsillectomy   Other Problems  Anxiety Disorder  Chronic Pain  Heart murmur  Hypercholesterolemia  Peripheral Neuropathy  Sleep Apnea   Vitals  Weight: 165 lb Height: 64in Body Surface Area: 1.8 m Body Mass Index: 28.32 kg/m  Temp.: 98.22F(Oral)  Pulse: 63 (Regular)  BP: 108/59 (Sitting, Left Arm, Standard)  General General Appearance-Not in acute distress. Orientation-Oriented X3. Build & Nutrition-Well nourished and Well developed.  Integumentary General Characteristics Surgical Scars - no surgical scar evidence of previous lumbar surgery. Lumbar Spine-Skin examination of the lumbar spine is without deformity, skin lesions, lacerations or abrasions.  Chest and Lung Exam Auscultation Breath sounds - Normal and Clear.  Cardiovascular Auscultation Rhythm - Regular rate and  rhythm.  Abdomen Palpation/Percussion Palpation and Percussion of the abdomen  reveal - Soft, Non Tender and No Rebound tenderness.  Peripheral Vascular Lower Extremity Palpation - Posterior tibial pulse - Bilateral - 2+. Dorsalis pedis pulse - Bilateral - 2+.  Neurologic Sensation Lower Extremity - Bilateral - sensation is diminished in the lower extremity. Reflexes Patellar Reflex - Bilateral - 2+. Achilles Reflex - Bilateral - 2+. Clonus - Bilateral - clonus not present. Hoffman's Sign - Bilateral - Hoffman's sign not present. Testing Seated Straight Leg Raise - Bilateral - Seated straight leg raise negative.  Musculoskeletal Spine/Ribs/Pelvis  Lumbosacral Spine: Inspection and Palpation - Tenderness - left lumbar paraspinals tender to palpation and right lumbar paraspinals tender to palpation. Strength and Tone: Strength - Hip Flexion - Bilateral - 5/5. Knee Extension - Bilateral - 5/5. Knee Flexion - Bilateral - 5/5. Ankle Dorsiflexion - Bilateral - 5/5. Ankle Plantarflexion - Bilateral - 5/5. Heel walk - Bilateral - able to heel walk without difficulty. Toe Walk - Bilateral - able to walk on toes without difficulty. Heel-Toe Walk - Bilateral - able to heel-toe walk without difficulty. ROM - Flexion - mildly decreased range of motion. Extension - moderately decreased range of motion and painful. Left Lateral Bending - moderately decreased range of motion and painful. Right Lateral Bending - moderately decreased range of motion and painful. Pain - neither flexion or extension is more painful than the other. Lumbosacral Spine - Waddell's Signs - no Waddell's signs present. Lower Extremity Range of Motion - No true hip, knee or ankle pain with range of motion. Gait and Station - [employer] - no assistive devices.  MRI from November 2018 is significant for advanced degenerative disc disease at L5-S1 moderate to severe left foraminal narrowing at L4-5 severe left neuroforaminal  narrowing and moderate to severe central canal stenosis.  No residual stenosis at the L3-4 level (site of index surgical procedure in 2016).  Assessment & Plan   Goal Of Surgery: Discussed that goal of surgery is to reduce pain and improve function and quality of life. Patient is aware that despite all appropriate treatment that there pain and function could be the same, worse, or different. Posterior Lumbar Decompression/disectomy: Risks of surgery include infection, bleeding, nerve damage, death, stroke, paralysis, failure to heal, need for further surgery, ongoing or worse pain, need for further surgery, CSF leak, loss of bowel or bladder, and recurrent disc herniation or Stenosis which would necessitate need for further surgery.  No change in the patient's clinical exam since her last office visit with me on June 11, 2017.    Patient had a prior L3-4 decompression and has done well.  However she is developing significant increased back buttock and neuropathic leg pain.  Imaging studies demonstrate significant left foraminal stenosis and lateral recess stenosis at L4-5, and L5-S1.  As result of the failure of conservative management we have elected to move forward with a decompression at the L5-S1 and the L4-5 levels.  I again reviewed the risks benefits and alternatives with the patient and her son and all questions and concerns were addressed.

## 2017-06-20 NOTE — Evaluation (Signed)
Occupational Therapy Evaluation Patient Details Name: Courtney [name] MRN: [id number] DOB: Dec 01, 1950 Today's Date: 06/20/2017    History of Present Illness Pt is a 67 y/o female s/p L4-5 decompression and L5-S1 laminectomy. PMH includes asthma, OSA, and fibromyalgia.    Clinical Impression   PTA, pt was living alone and was independent; pt planning for son to stay at dc. Currently, pt requires Supervision-Min Guard A for ADLs and functional mobility using RW. Provided education on brace management, UB ADLs, LB ADLs, toilet transfer, and shower transfer; pt demonstrated and verbalized understanding. Answered all pt questions. Recommend dc home once medically stable per physician. All acute OT needs met and will sign off. Thank you.     Follow Up Recommendations  No OT follow up;Supervision/Assistance - 24 hour    Equipment Recommendations  None recommended by OT    Recommendations for Other Services PT consult     Precautions / Restrictions Precautions Precautions: Back Precaution Booklet Issued: Yes (comment) Precaution Comments: Reviewed back precautions with pt. Pt abel to recall 2/3 and then required Min VCs for no lifting Required Braces or Orthoses: Spinal Brace Spinal Brace: Lumbar corset;Applied in sitting position Restrictions Weight Bearing Restrictions: No      Mobility Bed Mobility Overal bed mobility: Needs Assistance Bed Mobility: Rolling;Sidelying to Sit;Sit to Sidelying Rolling: Supervision Sidelying to sit: Supervision     Sit to sidelying: Supervision General bed mobility comments: Supervision to ensure log roll technique. benefits from encouragment for correct positioning  Transfers Overall transfer level: Needs assistance Equipment used: None Transfers: Sit to/from Stand Sit to Stand: Supervision         General transfer comment: supervision for safety    Balance Overall balance assessment: Needs assistance Sitting-balance support: No  upper extremity supported;Feet supported Sitting balance-Leahy Scale: Good     Standing balance support: Single extremity supported;During functional activity Standing balance-Leahy Scale: Fair Standing balance comment: Able to perform LB dressing without UE support                           ADL either performed or assessed with clinical judgement   ADL Overall ADL's : Needs assistance/impaired       Grooming Details (indicate cue type and reason): Educating pt on compesantory technique for grooming and adherance to precautions Upper Body Bathing: Set up;Supervision/ safety;Standing   Lower Body Bathing: Set up;Supervison/ safety;Sit to/from stand Lower Body Bathing Details (indicate cue type and reason): Educating pt on use of AE. Upper Body Dressing : Set up;Supervision/safety;Sitting Upper Body Dressing Details (indicate cue type and reason): Pt verbalizing understanding of brace management Lower Body Dressing: Set up;Supervision/safety;Sit to/from stand Lower Body Dressing Details (indicate cue type and reason): Pt donning pants with supervision for safety. Toilet Transfer: Set up;Supervision/safety;Ambulation(Simulated in room)     Toileting - Clothing Manipulation Details (indicate cue type and reason): Educated pt on toilet hygiene and adherance to precautions     Functional mobility during ADLs: Min guard General ADL Comments: Pt performing ADLs and fucntional mobility at [place] guard level.      Vision Baseline Vision/History: Wears glasses Wears Glasses: At all times Patient Visual Report: No change from baseline       Perception     Praxis      Pertinent Vitals/Pain Pain Assessment: Faces Pain Score: 3  Faces Pain Scale: Hurts little more Pain Location: back  Pain Descriptors / Indicators: Aching;Operative site guarding Pain Intervention(s): Monitored during session;Limited activity  within patient's tolerance;Repositioned     Hand Dominance      Extremity/Trunk Assessment Upper Extremity Assessment Upper Extremity Assessment: Overall WFL for tasks assessed   Lower Extremity Assessment Lower Extremity Assessment: Defer to PT evaluation RLE Deficits / Details: Numbness in RLE at baseline. Reports it feels better after surgery.    Cervical / Trunk Assessment Cervical / Trunk Assessment: Other exceptions Cervical / Trunk Exceptions: s/p laminectomy and decompression.    Communication Communication Communication: No difficulties   Cognition Arousal/Alertness: Awake/alert Behavior During Therapy: WFL for tasks assessed/performed Overall Cognitive Status: Within Functional Limits for tasks assessed                                     General Comments  Pt stating she feels out of it because of medication    Exercises     Shoulder Instructions      Home Living Family/patient expects to be discharged to:: Private residence Living Arrangements: Alone Available Help at Discharge: Family;Available 24 hours/day(until tuesday ) Type of Home: House Home Access: Stairs to enter [employer] of Steps: 3-4 Entrance Stairs-Rails: Left Home Layout: Two level;Laundry or work area in basement Alternate Therapist, sports of Steps: flight  Alternate Level Stairs-Rails: Left Bathroom Shower/Tub: Tub/shower unit;Walk-in shower   Bathroom Toilet: Handicapped height     Home Equipment: Environmental consultant - 2 wheels;Cane - single point;Bedside commode;Shower seat          Prior Functioning/Environment Level of Independence: Independent with assistive device(s)        Comments: Reports independence with mobility unless she was having a bad day. Then she would use her cane.         OT Problem List: Decreased range of motion;Decreased activity tolerance;Impaired balance (sitting and/or standing);Decreased knowledge of use of DME or AE;Decreased knowledge of precautions;Pain      OT Treatment/Interventions:       OT Goals(Current goals can be found in the care plan section) Acute Rehab OT Goals Patient Stated Goal: to go home  OT Goal Formulation: All assessment and education complete, DC therapy  OT Frequency:     Barriers to D/C:            Co-evaluation              AM-PAC PT "6 Clicks" Daily Activity     Outcome Measure Help from another person eating meals?: None Help from another person taking care of personal grooming?: A Little Help from another person toileting, which includes using toliet, bedpan, or urinal?: A Little Help from another person bathing (including washing, rinsing, drying)?: A Little Help from another person to put on and taking off regular upper body clothing?: A Little Help from another person to put on and taking off regular lower body clothing?: A Little 6 Click Score: 19   End of Session Nurse Communication: Mobility status;Precautions  Activity Tolerance: Patient tolerated treatment well Patient left: in bed;with call bell/phone within reach;with nursing/sitter in room  OT Visit Diagnosis: Unsteadiness on feet (R26.81);Other abnormalities of gait and mobility (R26.89);Pain Pain - part of body: (Back)                Time: 1700-1749 OT Time Calculation (min): 18 min Charges:  OT General Charges $OT Visit: 1 Visit OT Evaluation $OT Eval Low Complexity: 1 Low G-Codes:     [place] MSOT, OTR/L Acute Rehab Pager: [phone] Office: [phone]  Heywood Footman Genny Caulder 06/20/2017, 4:51 PM

## 2017-06-20 NOTE — Brief Op Note (Signed)
06/20/2017  11:37 AM  PATIENT:  Courtney [name]  67 y.o. female  PRE-OPERATIVE DIAGNOSIS:  Lumbar spinal stenosis  POST-OPERATIVE DIAGNOSIS:  Lumbar spinal stenosis  PROCEDURE:  Procedure(s) with comments: L4-5 decompression, L5-S1 left laminotomy/foraminotomy (N/A) - 3 hrs  SURGEON:  Surgeon(s) and Role:    Melina Schools, MD - Primary  PHYSICIAN ASSISTANT:   ASSISTANTS: Carmen Mayo   ANESTHESIA:   general  EBL:  175 mL   BLOOD ADMINISTERED:none  DRAINS: 1 JP in the back - stitched in   LOCAL MEDICATIONS USED:  MARCAINE     SPECIMEN:  No Specimen  DISPOSITION OF SPECIMEN:  N/A  COUNTS:  YES  TOURNIQUET:  * No tourniquets in log *  DICTATION: .Dragon Dictation  PLAN OF CARE: Admit to inpatient   PATIENT DISPOSITION:  PACU - hemodynamically stable.

## 2017-06-21 ENCOUNTER — Other Ambulatory Visit: Payer: Self-pay

## 2017-06-21 ENCOUNTER — Encounter (HOSPITAL_COMMUNITY): Payer: Self-pay | Admitting: Orthopedic Surgery

## 2017-06-21 DIAGNOSIS — Z981 Arthrodesis status: Secondary | ICD-10-CM | POA: Diagnosis not present

## 2017-06-21 DIAGNOSIS — M7061 Trochanteric bursitis, right hip: Secondary | ICD-10-CM | POA: Diagnosis present

## 2017-06-21 DIAGNOSIS — Z87891 Personal history of nicotine dependence: Secondary | ICD-10-CM | POA: Diagnosis not present

## 2017-06-21 DIAGNOSIS — Z8261 Family history of arthritis: Secondary | ICD-10-CM | POA: Diagnosis not present

## 2017-06-21 DIAGNOSIS — Q231 Congenital insufficiency of aortic valve: Secondary | ICD-10-CM | POA: Diagnosis not present

## 2017-06-21 DIAGNOSIS — M48061 Spinal stenosis, lumbar region without neurogenic claudication: Secondary | ICD-10-CM | POA: Diagnosis present

## 2017-06-21 DIAGNOSIS — Z8249 Family history of ischemic heart disease and other diseases of the circulatory system: Secondary | ICD-10-CM | POA: Diagnosis not present

## 2017-06-21 DIAGNOSIS — M797 Fibromyalgia: Secondary | ICD-10-CM | POA: Diagnosis present

## 2017-06-21 DIAGNOSIS — M5106 Intervertebral disc disorders with myelopathy, lumbar region: Secondary | ICD-10-CM | POA: Diagnosis present

## 2017-06-21 DIAGNOSIS — Z91018 Allergy to other foods: Secondary | ICD-10-CM | POA: Diagnosis not present

## 2017-06-21 DIAGNOSIS — K5903 Drug induced constipation: Secondary | ICD-10-CM | POA: Diagnosis present

## 2017-06-21 DIAGNOSIS — E785 Hyperlipidemia, unspecified: Secondary | ICD-10-CM | POA: Diagnosis present

## 2017-06-21 DIAGNOSIS — F329 Major depressive disorder, single episode, unspecified: Secondary | ICD-10-CM | POA: Diagnosis present

## 2017-06-21 DIAGNOSIS — G894 Chronic pain syndrome: Secondary | ICD-10-CM | POA: Diagnosis present

## 2017-06-21 DIAGNOSIS — Z91013 Allergy to seafood: Secondary | ICD-10-CM | POA: Diagnosis not present

## 2017-06-21 DIAGNOSIS — G629 Polyneuropathy, unspecified: Secondary | ICD-10-CM | POA: Diagnosis present

## 2017-06-21 DIAGNOSIS — R011 Cardiac murmur, unspecified: Secondary | ICD-10-CM | POA: Diagnosis present

## 2017-06-21 DIAGNOSIS — E039 Hypothyroidism, unspecified: Secondary | ICD-10-CM | POA: Diagnosis present

## 2017-06-21 DIAGNOSIS — Z809 Family history of malignant neoplasm, unspecified: Secondary | ICD-10-CM | POA: Diagnosis not present

## 2017-06-21 DIAGNOSIS — E7800 Pure hypercholesterolemia, unspecified: Secondary | ICD-10-CM | POA: Diagnosis present

## 2017-06-21 DIAGNOSIS — J45909 Unspecified asthma, uncomplicated: Secondary | ICD-10-CM | POA: Diagnosis present

## 2017-06-21 DIAGNOSIS — T402X5A Adverse effect of other opioids, initial encounter: Secondary | ICD-10-CM | POA: Diagnosis present

## 2017-06-21 DIAGNOSIS — M4802 Spinal stenosis, cervical region: Secondary | ICD-10-CM | POA: Diagnosis not present

## 2017-06-21 DIAGNOSIS — K219 Gastro-esophageal reflux disease without esophagitis: Secondary | ICD-10-CM | POA: Diagnosis present

## 2017-06-21 DIAGNOSIS — G4730 Sleep apnea, unspecified: Secondary | ICD-10-CM | POA: Diagnosis present

## 2017-06-21 MED ORDER — MAGNESIUM CITRATE PO SOLN
1.0000 | Freq: Once | ORAL | Status: AC
  Administered 2017-06-21: 1 via ORAL
  Filled 2017-06-21: qty 296

## 2017-06-21 NOTE — Progress Notes (Signed)
    Subjective: 1 Day Post-Op Procedure(s) (LRB): L4-5 decompression, L5-S1 left laminotomy/foraminotomy (N/A) Patient reports pain as 4 on 0-10 scale.   Denies CP or SOB.  Voiding without difficulty. Positive flatus. Pt walking in the hallway.   Objective: Vital signs in last 24 hours: Temp:  [97.4 F (36.3 C)-98.9 F (37.2 C)] 98.9 F (37.2 C) (01/10 0400) Pulse Rate:  [59-86] 66 (01/10 0400) Resp:  [9-19] 18 (01/10 0400) BP: (87-113)/(45-65) 95/49 (01/10 0400) SpO2:  [96 %-100 %] 97 % (01/10 0400)  Intake/Output from previous day: 01/09 0701 - 01/10 0700 In: 2290 [P.O.:240; I.V.:1800; IV Piggyback:250] Out: 1315 [Urine:1000; Drains:140; Blood:175] Intake/Output this shift: No intake/output data recorded.  Labs: Recent Labs    06/18/17 1257  HGB 12.2   Recent Labs    06/18/17 1257  WBC 6.4  RBC 4.01  HCT 37.5  PLT 179   Recent Labs    06/18/17 1257  NA 140  K 4.3  CL 105  CO2 28  BUN 13  CREATININE 0.73  GLUCOSE 92  CALCIUM 9.4   No results for input(s): LABPT, INR in the last 72 hours.  Physical Exam: Neurologically intact ABD soft Sensation intact distally Dorsiflexion/Plantar flexion intact Incision: scant drainage Compartment soft Body mass index is 26.09 kg/m.   Assessment/Plan: 1 Day Post-Op Procedure(s) (LRB): L4-5 decompression, L5-S1 left laminotomy/foraminotomy (N/A) Advance diet Up with therapy  Drain taken out last night by the nurse as it stopped working 100 cc before it was removed Pt would like to stay another night  Mayo, Darla Lesches for Dr. Melina Schools [facility] [phone] 06/21/2017, 7:25 AM

## 2017-06-21 NOTE — Progress Notes (Signed)
Physical Therapy Treatment Patient Details Name: Courtney [name] MRN: [id number] DOB: 02-05-1951 Today's Date: 06/21/2017    History of Present Illness Pt is a 67 y/o female s/p L4-5 decompression and L5-S1 laminectomy. PMH includes asthma, OSA, and fibromyalgia.     PT Comments    Pt progressing towards physical therapy goals. Was able to perform transfers and ambulation with gross min guard assist to supervision for safety. Pt was educated on brace application/wearing schedule, precautions, car transfer, and general safety with activity progression. Will continue to follow and progress as able per POC.    Follow Up Recommendations  No PT follow up;Supervision for mobility/OOB     Equipment Recommendations  None recommended by PT    Recommendations for Other Services       Precautions / Restrictions Precautions Precautions: Back Precaution Booklet Issued: Yes (comment) Precaution Comments: Reviewed back precautions with pt. Pt abel to recall 2/3 and then required Min VCs for no lifting Required Braces or Orthoses: Spinal Brace Spinal Brace: Lumbar corset;Applied in sitting position Restrictions Weight Bearing Restrictions: No    Mobility  Bed Mobility Overal bed mobility: Needs Assistance Bed Mobility: Rolling;Sidelying to Sit;Sit to Sidelying Rolling: Supervision Sidelying to sit: Supervision       General bed mobility comments: Supervision to ensure log roll technique. benefits from encouragment for correct positioning  Transfers Overall transfer level: Needs assistance Equipment used: Straight cane Transfers: Sit to/from Stand Sit to Stand: Supervision         General transfer comment: supervision for safety  Ambulation/Gait Ambulation/Gait assistance: Min guard;Supervision   Assistive device: Straight cane Gait Pattern/deviations: Step-through pattern;Decreased stride length;Drifts right/left Gait velocity: Decreased  Gait velocity interpretation:  Below normal speed for age/gender General Gait Details: Slow and guarded gait. With [facility], pt still reaching out occasionally for railing in hall due to unsteadiness. Overall, no assist was required, however min guard to close supervision provided for safety.    Stairs Stairs: Yes   Stair Management: One rail Left;With cane;Step to pattern Number of Stairs: 10 General stair comments: VC's for sequencing and general safety with stair negotiation.   Wheelchair Mobility    Modified Rankin (Stroke Patients Only)       Balance Overall balance assessment: Needs assistance Sitting-balance support: No upper extremity supported;Feet supported Sitting balance-Leahy Scale: Good     Standing balance support: Single extremity supported;During functional activity Standing balance-Leahy Scale: Fair                              Cognition Arousal/Alertness: Awake/alert Behavior During Therapy: WFL for tasks assessed/performed Overall Cognitive Status: Within Functional Limits for tasks assessed                                        Exercises      General Comments        Pertinent Vitals/Pain Pain Assessment: Faces Faces Pain Scale: Hurts a little bit Pain Location: back  Pain Descriptors / Indicators: Aching;Operative site guarding Pain Intervention(s): Limited activity within patient's tolerance;Monitored during session;Repositioned    Home Living Family/patient expects to be discharged to:: Private residence Living Arrangements: Alone                  Prior Function            PT Goals (current goals can now  be found in the care plan section) Acute Rehab PT Goals Patient Stated Goal: to go home  PT Goal Formulation: With patient Time For Goal Achievement: 07/04/17 Potential to Achieve Goals: Good Progress towards PT goals: Progressing toward goals    Frequency    Min 5X/week      PT Plan Current plan remains appropriate     Co-evaluation              AM-PAC PT "6 Clicks" Daily Activity  Outcome Measure  Difficulty turning over in bed (including adjusting bedclothes, sheets and blankets)?: None Difficulty moving from lying on back to sitting on the side of the bed? : A Little Difficulty sitting down on and standing up from a chair with arms (e.g., wheelchair, bedside commode, etc,.)?: A Little Help needed moving to and from a bed to chair (including a wheelchair)?: A Little Help needed walking in hospital room?: A Little Help needed climbing 3-5 steps with a railing? : A Little 6 Click Score: 19    End of Session Equipment Utilized During Treatment: Gait belt;Back brace Activity Tolerance: Patient tolerated treatment well Patient left: Other (comment)(In bathroom - nursing aware) Nurse Communication: Mobility status PT Visit Diagnosis: Unsteadiness on feet (R26.81);Other abnormalities of gait and mobility (R26.89);Pain Pain - part of body: (back )     Time: 3295-1884 PT Time Calculation (min) (ACUTE ONLY): 27 min  Charges:  $Gait Training: 23-37 mins                    G Codes:       Rolinda Roan, PT, DPT Acute Rehabilitation Services Pager: [phone]    Thelma Comp 06/21/2017, 10:32 AM

## 2017-06-21 NOTE — Anesthesia Postprocedure Evaluation (Signed)
Anesthesia Post Note  Patient: Courtney [name]  Procedure(s) Performed: L4-5 decompression, L5-S1 left laminotomy/foraminotomy (N/A Back)     Patient location during evaluation: PACU Anesthesia Type: General Level of consciousness: awake and alert Pain management: pain level controlled Vital Signs Assessment: post-procedure vital signs reviewed and stable Respiratory status: spontaneous breathing, nonlabored ventilation, respiratory function stable and patient connected to nasal cannula oxygen Cardiovascular status: blood pressure returned to baseline and stable Postop Assessment: no apparent nausea or vomiting Anesthetic complications: no    Last Vitals:  Vitals:   06/20/17 2342 06/21/17 0400  BP: (!) 105/52 (!) 95/49  Pulse: 65 66  Resp: 18 18  Temp: 36.9 C 37.2 C  SpO2: 97% 97%    Last Pain:  Vitals:   06/21/17 0800  TempSrc:   PainSc: [place]

## 2017-06-22 NOTE — Progress Notes (Signed)
    Subjective: Procedure(s) (LRB): L4-5 decompression, L5-S1 left laminotomy/foraminotomy (N/A) 2 Days Post-Op  Patient reports pain as 2 on 0-10 scale.  Reports decreased leg pain reports incisional back pain   Positive void Negative bowel movement Positive flatus Negative chest pain or shortness of breath  Objective: Vital signs in last 24 hours: Temp:  [99.1 F (37.3 C)-100.3 F (37.9 C)] 99.1 F (37.3 C) (01/11 0345) Pulse Rate:  [74-81] 77 (01/11 0345) Resp:  [16-18] 16 (01/11 0345) BP: (84-121)/(53-80) 94/80 (01/11 0345) SpO2:  [94 %-99 %] 94 % (01/11 0345)  Intake/Output from previous day: No intake/output data recorded.  Labs: No results for input(s): WBC, RBC, HCT, PLT in the last 72 hours. No results for input(s): NA, K, CL, CO2, BUN, CREATININE, GLUCOSE, CALCIUM in the last 72 hours. No results for input(s): LABPT, INR in the last 72 hours.  Physical Exam: Neurologically intact Neurovascular intact Intact pulses distally Incision: dressing C/D/I Compartment soft Body mass index is 26.09 kg/m.   Assessment/Plan: Patient stable  xrays n/a Continue mobilization with physical therapy Continue care  Advance diet Up with therapy  Doing well overall.  Wound c/d/i Plan on d/c to home today  Melina Schools, [place] [phone]

## 2017-06-22 NOTE — Progress Notes (Signed)
Patient is discharged from room 3C02 at this time. Alert and in stable condition. IV site d/c'd and instructions read to patient and son with understanding verbalized. Left unit via wheelchair with son and all belongings at side.

## 2017-06-22 NOTE — Discharge Instructions (Signed)
Lumbar Decompression, Care After Refer to this sheet in the next few weeks. These instructions provide you with information about caring for yourself after your procedure. Your health care provider may also give you more specific instructions. Your treatment has been planned according to current medical practices, but problems sometimes occur. Call your health care provider if you have any problems or questions after your procedure. What can I expect after the procedure? After the procedure, it is common to have:  Pain.  Numbness.  Weakness.  Follow these instructions at home: Medicines  Take medicines only as directed by your health care provider.  If you were prescribed an antibiotic medicine, finish all of it even if you start to feel better. Incision care  There are many different ways to close and cover an incision, including stitches (sutures), skin glue, and adhesive strips. Follow your health care provider's instructions about: ? Incision care. ? Bandage (dressing) changes and removal. ? Incision closure removal.  Check your incision area every day for signs of infection. If you cannot see your incision, have someone check it for you. Watch for: ? Redness, swelling, or pain. ? Fluid, blood, or pus.  Ok to shower after 5 days Activity  Avoid sitting for longer than 20 minutes at a time or as directed by your health care provider.  Do not climb stairs more than once each day until your health care provider approves.  Do not bend at your waist. To pick things up, bend your knees.  Do not lift anything that is heavier than 10 lb (4.5 kg) or as directed by your health care provider.  Do not drive a car until your health care provider approves.  Ask your health care provider when you may return to your normal activities, such as playing sports and going back to work.  Work with your physical therapist to learn safe movement and exercises to help healing. Do these  exercises as directed.  Take short walks often. General instructions  Do not use any tobacco products, including cigarettes, chewing tobacco, or electronic cigarettes. If you need help quitting, ask your health care provider.  Follow your health care providers instructions about bathing. Do not take baths, shower, swim, or use a hot tub until your health care provider approves.  Wear your back brace as directed by your health care provider.  To prevent constipation: ? Drink enough fluid to keep your urine clear or pale yellow. ? Eat plenty of fruits, vegetables, and whole grains.  Keep all follow-up visits as directed by your health care provider. This is important. This includes any follow-up visits with your physical therapist. Contact a health care provider if:  You have a fever.  You have redness, swelling, or pain in your incision area.  Your pain is not controlled with medicine.  You have pain, numbness, or weakness that lasts longer than three weeks after surgery.  You become constipated. Get help right away if:  You have fluid, blood, or pus coming from your incision.  You have increasing pain, numbness, or weakness.  You lose control of when you urinate or have a bowel movement (incontinence).  You have chest pain.  You have trouble breathing. This information is not intended to replace advice given to you by your health care provider. Make sure you discuss any questions you have with your health care provider. Document Released: 05/03/2004 Document Revised: 11/04/2015 Document Reviewed: 01/21/2014 Elsevier Interactive Patient Education  [employer].

## 2017-06-22 NOTE — Progress Notes (Signed)
Physical Therapy Treatment and Discharge Patient Details Name: Courtney [name] MRN: [id number] DOB: [birth year] Today's Date: 06/22/2017    History of Present Illness Pt is a 67 y/o female s/p L4-5 decompression and L5-S1 laminectomy. PMH includes asthma, OSA, and fibromyalgia.     PT Comments    Pt progressing well towards physical therapy goals. Was able to perform transfers and ambulation with gross modified independence. Continues to use [facility] for balance support and safety, however only slightly guarded this session and not reaching out for external support this session. Pt anticipates d/c home today. Will defer further ambulation to unit staff until d/c. Will sign off at this time as pt is able to recall and maintain precautions and has met acute PT goals. If needs change, please reconsult.  Follow Up Recommendations  No PT follow up;Supervision for mobility/OOB     Equipment Recommendations  None recommended by PT    Recommendations for Other Services       Precautions / Restrictions Precautions Precautions: Back Precaution Booklet Issued: Yes (comment) Precaution Comments: Reviewed back precautions with pt. Pt able to recall 3/3 precautions without cues. Required Braces or Orthoses: Spinal Brace Spinal Brace: Lumbar corset;Applied in sitting position Restrictions Weight Bearing Restrictions: No    Mobility  Bed Mobility Overal bed mobility: Modified Independent Bed Mobility: Rolling;Sidelying to Sit;Sit to Sidelying Rolling: Modified independent (Device/Increase time)       Sit to sidelying: Modified independent (Device/Increase time) General bed mobility comments: Pt demonstrated proper log roll technique with HOB flat and rails lowered to simulate home environment. Bed height raised   Transfers Overall transfer level: Needs assistance Equipment used: Straight cane Transfers: Sit to/from Stand Sit to Stand: Modified independent (Device/Increase time)         General transfer comment: Pt demonstrated proper hand placement on seated surface for safety. Pt was able to power-up to full stand without assistance.   Ambulation/Gait Ambulation/Gait assistance: Modified independent (Device/Increase time) Ambulation Distance (Feet): 400 Feet Assistive device: Straight cane Gait Pattern/deviations: Step-through pattern;Decreased stride length;Drifts right/left Gait velocity: Decreased  Gait velocity interpretation: Below normal speed for age/gender General Gait Details: Continues to appear guarded however overall steady and safe with ambulation. Pt was educated on proper [facility] use however pt states she prefers is turned around backwards.    Stairs         General stair comments: Pt declines further stair training.   Wheelchair Mobility    Modified Rankin (Stroke Patients Only)       Balance Overall balance assessment: Needs assistance Sitting-balance support: No upper extremity supported;Feet supported Sitting balance-Leahy Scale: Good     Standing balance support: Single extremity supported;During functional activity Standing balance-Leahy Scale: Fair Standing balance comment: Light support on SPC                            Cognition Arousal/Alertness: Awake/alert Behavior During Therapy: WFL for tasks assessed/performed Overall Cognitive Status: Within Functional Limits for tasks assessed                                        Exercises      General Comments        Pertinent Vitals/Pain Pain Assessment: Faces Faces Pain Scale: Hurts a little bit Pain Location: back  Pain Descriptors / Indicators: Aching;Operative site guarding Pain Intervention(s): Monitored during session  Home Living                      Prior Function            PT Goals (current goals can now be found in the care plan section) Acute Rehab PT Goals Patient Stated Goal: to go home  PT Goal Formulation: With  patient Time For Goal Achievement: 07/04/17 Potential to Achieve Goals: Good Progress towards PT goals: Progressing toward goals    Frequency    Min 5X/week      PT Plan Current plan remains appropriate    Co-evaluation              AM-PAC PT "6 Clicks" Daily Activity  Outcome Measure  Difficulty turning over in bed (including adjusting bedclothes, sheets and blankets)?: None Difficulty moving from lying on back to sitting on the side of the bed? : A Little Difficulty sitting down on and standing up from a chair with arms (e.g., wheelchair, bedside commode, etc,.)?: A Little Help needed moving to and from a bed to chair (including a wheelchair)?: A Little Help needed walking in hospital room?: A Little Help needed climbing 3-5 steps with a railing? : A Little 6 Click Score: 19    End of Session Equipment Utilized During Treatment: Gait belt;Back brace Activity Tolerance: Patient tolerated treatment well Patient left: in bed;with call bell/phone within reach Nurse Communication: Mobility status PT Visit Diagnosis: Unsteadiness on feet (R26.81);Other abnormalities of gait and mobility (R26.89);Pain Pain - part of body: (back )     Time: 6834-1962 PT Time Calculation (min) (ACUTE ONLY): 20 min  Charges:  $Gait Training: 8-22 mins                    G Codes:       Rolinda Roan, PT, DPT Acute Rehabilitation Services Pager: [phone]    Thelma Comp 06/22/2017, 9:08 AM

## 2017-06-27 NOTE — Discharge Summary (Signed)
Physician Discharge Summary  Patient ID: Courtney [name] MRN: [id number] DOB/AGE: 08/19/1950 67 y.o.  Admit date: 06/20/2017 Discharge date: 06/27/2017  Admission Diagnoses:  Lumbar DDD  Discharge Diagnoses:  Active Problems:   Status post lumbar spine surgery for decompression of spinal cord   Past Medical History:  Diagnosis Date  . Arthritis   . Bicuspid aortic valve    bicuspid AV with no stenosis or regurgitation 01/2015 echo   . Complication of anesthesia    slow to wake up and lingers for a long time  . Depression   . Family history of adverse reaction to anesthesia    mom had skin reaction to anesthesia but cant remember what it was or exactly what happened  . Fibromyalgia   . GERD (gastroesophageal reflux disease)   . Hyperlipidemia   . Hypothyroidism   . Sleep apnea    does not wear CPAP    Surgeries: Procedure(s): L4-5 decompression, L5-S1 left laminotomy/foraminotomy on 06/20/2017   Consultants (if any):   Discharged Condition: Improved  Hospital Course: Courtney [name] is an 67 y.o. female who was admitted 06/20/2017 with a diagnosis of Lumbar DDD and went to the operating room on 06/20/2017 and underwent the above named procedures.   Post op day 1 pt reports moderate pain controlled on oral meds.  Pt requested another night in hospital.  Post op day 2 pt reported low level of pain.  She has been ambulating in hallway.  She reports no difficulty with voiding.  Denies nausea.  Pt cleared by PT to go home.  Pt son will be with her initially.   She was given perioperative antibiotics:  Anti-infectives (From admission, onward)   Start     Dose/Rate Route Frequency Ordered Stop   06/20/17 1700  ceFAZolin (ANCEF) IVPB 2g/100 mL premix     2 g 200 mL/hr over 30 Minutes Intravenous Every 8 hours 06/20/17 1427 06/21/17 0116   06/20/17 0715  ceFAZolin (ANCEF) 2-4 GM/100ML-% IVPB    Comments:  Tamsen Snider   : cabinet override      06/20/17 0715 06/20/17 0840   06/20/17 0713  ceFAZolin (ANCEF) IVPB 2g/100 mL premix  Status:  Discontinued     2 g 200 mL/hr over 30 Minutes Intravenous 30 min pre-op 06/20/17 0713 06/20/17 0719   06/20/17 0713  ceFAZolin (ANCEF) IVPB 2g/100 mL premix     2 g 200 mL/hr over 30 Minutes Intravenous 30 min pre-op 06/20/17 0713 06/20/17 0840    .  She was given sequential compression devices, early ambulation, and TED for DVT prophylaxis.  She benefited maximally from the hospital stay and there were no complications.    Recent vital signs:  Vitals:   06/22/17 0345 06/22/17 0815  BP: 94/80 (!) 110/48  Pulse: 77 67  Resp: 16 18  Temp: 99.1 F (37.3 C) 98.7 F (37.1 C)  SpO2: 94% 95%    Recent laboratory studies:  Lab Results  Component Value Date   HGB 12.2 06/18/2017   HGB 12.4 06/22/2016   HGB 12.5 04/16/2015   Lab Results  Component Value Date   WBC 6.4 06/18/2017   PLT 179 06/18/2017   No results found for: INR Lab Results  Component Value Date   NA 140 06/18/2017   K 4.3 06/18/2017   CL 105 06/18/2017   CO2 28 06/18/2017   BUN 13 06/18/2017   CREATININE 0.73 06/18/2017   GLUCOSE 92 06/18/2017    Discharge Medications:  Allergies as of 06/22/2017      Reactions   Fish Allergy Anaphylaxis   Can tolerate shellfish    Peanuts [peanut Oil] Anaphylaxis   ALL NUTS and their derivatives      Medication List    STOP taking these medications   aspirin 81 MG EC tablet   lidocaine 5 % Commonly known as:  LIDODERM   oxyCODONE 5 MG immediate release tablet Commonly known as:  Oxy IR/ROXICODONE     TAKE these medications   atorvastatin 40 MG tablet Commonly known as:  LIPITOR TAKE 1 TABLET DAILY What changed:    how much to take  how to take this  when to take this   Coenzyme Q10 300 MG Caps Take 300 mg by mouth daily.   DULoxetine 30 MG capsule Commonly known as:  CYMBALTA Take 60 mg by mouth daily after breakfast.   EPIPEN JR 2-PAK 0.15 MG/0.3ML injection Generic drug:   EPINEPHrine Inject 0.15 mg into the muscle as needed for anaphylaxis.   Ferrous Bisglycinate Chelate 15 MG Tabs Take 1 tablet by mouth every other day.   fluticasone 50 MCG/ACT nasal spray Commonly known as:  FLONASE Place 2 sprays into both nostrils at bedtime.   hydroxychloroquine 200 MG tablet Commonly known as:  PLAQUENIL Take 200 mg by mouth 2 (two) times daily.   levothyroxine 25 MCG tablet Commonly known as:  SYNTHROID, LEVOTHROID TAKE 1 AND 1/2 TABLET BY MOUTH EVERY MORNING What changed:    how much to take  how to take this  when to take this  additional instructions   loratadine 10 MG tablet Commonly known as:  CLARITIN Take 10 mg by mouth daily as needed (for seasonal allergies (Spring/Summer)).   MAGNESIUM CITRATE PO Take 400 mg by mouth 2 (two) times daily.   methocarbamol 500 MG tablet Commonly known as:  ROBAXIN Take 1 tablet (500 mg total) by mouth 3 (three) times daily for 10 days.   multivitamin with minerals tablet Take 1 tablet by mouth every other day.   OMEGA 3 PO Take 2,560 mg by mouth 2 (two) times daily.   omeprazole 20 MG capsule Commonly known as:  PRILOSEC Take 20 mg by mouth daily as needed (for heartburn/indigestion.).   ondansetron 4 MG disintegrating tablet Commonly known as:  ZOFRAN ODT Take 1 tablet (4 mg total) by mouth every 8 (eight) hours as needed for nausea or vomiting.   pregabalin 200 MG capsule Commonly known as:  LYRICA Take 200 mg by mouth 2 (two) times daily.   traZODone 100 MG tablet Commonly known as:  DESYREL Take 50-100 mg by mouth at bedtime as needed for sleep.   urea 40 % Crea Commonly known as:  CARMOL Apply 1 application topically every other day.   vitamin C 1000 MG tablet Take 1,000 mg by mouth 2 (two) times daily.   Vitamin D3 2000 units Tabs Take 2,000 Units by mouth every evening.   vitamin E 400 UNIT capsule Take 400 Units by mouth every other day. IN THE EVENING     ASK your doctor  about these medications   oxyCODONE-acetaminophen 10-325 MG tablet Commonly known as:  PERCOCET Take 1 tablet by mouth every 4 (four) hours as needed for up to 5 days for pain. Ask about: Should I take this medication?       Diagnostic Studies: Dg Lumbar Spine 1 View  Result Date: 06/20/2017 CLINICAL DATA:  L4-L5 decompression.  L5-S1 laminectomy. EXAM: LUMBAR SPINE -  1 VIEW COMPARISON:  Lumbar spine x-rays dated April 21, 2015. FINDINGS: Localizing instruments projecting over the L4 pedicle and S1-S2 level. Prior L3-L4 posterior decompression. Mild disc height loss and trace anterolisthesis at L3-L4. Moderate to severe disc height loss at L5-S1. No acute osseous abnormality. IMPRESSION: 1. Intraoperative localization as described above. Electronically Signed   By: Titus Dubin M.D.   On: 06/20/2017 13:16   Dg Spine Portable 1 View  Result Date: 06/20/2017 CLINICAL DATA:  L4-5, L5-S1 surgery EXAM: PORTABLE SPINE - 1 VIEW COMPARISON:  04/21/2015 FINDINGS: Posterior surgical instruments are posterior to the L5 vertebral body with a localizing instrument directed at L5-S1. IMPRESSION: Intraoperative localization as above. Electronically Signed   By: Rolm Baptise M.D.   On: 06/20/2017 09:26    Disposition: 01-Home or Self Care Pt will present to clinic in 2 weeks Post op meds provided  Discharge Instructions    Incentive spirometry RT   Complete by:  As directed       Follow-up Information    Melina Schools, MD Follow up in 2 week(s).   Specialty:  Orthopedic Surgery Contact information: [address] [place] [address] [phone]            Signed: Valinda Hoar 06/27/2017, 8:23 AM

## 2017-07-11 DIAGNOSIS — M542 Cervicalgia: Secondary | ICD-10-CM | POA: Diagnosis not present

## 2017-07-11 DIAGNOSIS — M961 Postlaminectomy syndrome, not elsewhere classified: Secondary | ICD-10-CM | POA: Diagnosis not present

## 2017-07-11 DIAGNOSIS — G894 Chronic pain syndrome: Secondary | ICD-10-CM | POA: Diagnosis not present

## 2017-07-11 DIAGNOSIS — M545 Low back pain: Secondary | ICD-10-CM | POA: Diagnosis not present

## 2017-07-20 ENCOUNTER — Other Ambulatory Visit: Payer: Self-pay | Admitting: Endocrinology

## 2017-07-21 ENCOUNTER — Other Ambulatory Visit: Payer: Self-pay | Admitting: Endocrinology

## 2017-08-14 DIAGNOSIS — M19011 Primary osteoarthritis, right shoulder: Secondary | ICD-10-CM | POA: Diagnosis not present

## 2017-08-14 DIAGNOSIS — M7542 Impingement syndrome of left shoulder: Secondary | ICD-10-CM | POA: Diagnosis not present

## 2017-08-14 DIAGNOSIS — M25512 Pain in left shoulder: Secondary | ICD-10-CM | POA: Diagnosis not present

## 2017-08-14 DIAGNOSIS — M25511 Pain in right shoulder: Secondary | ICD-10-CM | POA: Diagnosis not present

## 2017-08-21 DIAGNOSIS — D225 Melanocytic nevi of trunk: Secondary | ICD-10-CM | POA: Diagnosis not present

## 2017-08-21 DIAGNOSIS — Z23 Encounter for immunization: Secondary | ICD-10-CM | POA: Diagnosis not present

## 2017-08-21 DIAGNOSIS — L821 Other seborrheic keratosis: Secondary | ICD-10-CM | POA: Diagnosis not present

## 2017-08-21 DIAGNOSIS — D2230 Melanocytic nevi of unspecified part of face: Secondary | ICD-10-CM | POA: Diagnosis not present

## 2017-08-21 DIAGNOSIS — D1801 Hemangioma of skin and subcutaneous tissue: Secondary | ICD-10-CM | POA: Diagnosis not present

## 2017-08-21 DIAGNOSIS — L820 Inflamed seborrheic keratosis: Secondary | ICD-10-CM | POA: Diagnosis not present

## 2017-08-21 DIAGNOSIS — L814 Other melanin hyperpigmentation: Secondary | ICD-10-CM | POA: Diagnosis not present

## 2017-08-22 ENCOUNTER — Telehealth: Payer: Self-pay | Admitting: Endocrinology

## 2017-08-22 ENCOUNTER — Other Ambulatory Visit: Payer: Self-pay | Admitting: Endocrinology

## 2017-08-22 ENCOUNTER — Other Ambulatory Visit: Payer: Self-pay

## 2017-08-22 DIAGNOSIS — M7601 Gluteal tendinitis, right hip: Secondary | ICD-10-CM | POA: Diagnosis not present

## 2017-08-22 MED ORDER — LEVOTHYROXINE SODIUM 25 MCG PO TABS: ORAL_TABLET | ORAL | 2 refills | Status: DC

## 2017-08-22 NOTE — Telephone Encounter (Signed)
Patient is unable to pick her Levothyroxine 1 1/2 tablets (Pharmacy does not have right dosage either). Please send RX for Levothyroxine 1 and 1/2 tablets daily (either 0.375 or .375) to [facility] on [address] asap-Patient is out of medication. Patient requests a 90 day supply-she is tired of having to deal with this refill issue.

## 2017-08-22 NOTE — Telephone Encounter (Signed)
Refill done///patient informed  

## 2017-08-24 DIAGNOSIS — G894 Chronic pain syndrome: Secondary | ICD-10-CM | POA: Diagnosis not present

## 2017-08-24 DIAGNOSIS — M961 Postlaminectomy syndrome, not elsewhere classified: Secondary | ICD-10-CM | POA: Diagnosis not present

## 2017-08-27 ENCOUNTER — Other Ambulatory Visit: Payer: Self-pay | Admitting: Family Medicine

## 2017-08-27 DIAGNOSIS — Z1231 Encounter for screening mammogram for malignant neoplasm of breast: Secondary | ICD-10-CM

## 2017-09-11 ENCOUNTER — Ambulatory Visit
Admission: RE | Admit: 2017-09-11 | Discharge: 2017-09-11 | Disposition: A | Discharge status: Home / Self Care | Payer: Medicare Other | Source: Ambulatory Visit | Attending: Family Medicine | Admitting: Family Medicine

## 2017-09-11 DIAGNOSIS — Z1231 Encounter for screening mammogram for malignant neoplasm of breast: Secondary | ICD-10-CM

## 2017-09-12 ENCOUNTER — Other Ambulatory Visit: Payer: Self-pay | Admitting: Family Medicine

## 2017-09-12 DIAGNOSIS — R928 Other abnormal and inconclusive findings on diagnostic imaging of breast: Secondary | ICD-10-CM

## 2017-09-13 DIAGNOSIS — M79641 Pain in right hand: Secondary | ICD-10-CM | POA: Diagnosis not present

## 2017-09-13 DIAGNOSIS — M79642 Pain in left hand: Secondary | ICD-10-CM | POA: Diagnosis not present

## 2017-09-13 DIAGNOSIS — M13849 Other specified arthritis, unspecified hand: Secondary | ICD-10-CM | POA: Diagnosis not present

## 2017-09-17 ENCOUNTER — Ambulatory Visit
Admission: RE | Admit: 2017-09-17 | Discharge: 2017-09-17 | Disposition: A | Discharge status: Home / Self Care | Payer: Medicare Other | Source: Ambulatory Visit | Attending: Family Medicine | Admitting: Family Medicine

## 2017-09-17 ENCOUNTER — Ambulatory Visit: Payer: Medicare Other

## 2017-09-17 DIAGNOSIS — R928 Other abnormal and inconclusive findings on diagnostic imaging of breast: Secondary | ICD-10-CM | POA: Diagnosis not present

## 2017-09-19 DIAGNOSIS — M5416 Radiculopathy, lumbar region: Secondary | ICD-10-CM | POA: Diagnosis not present

## 2017-10-10 DIAGNOSIS — M545 Low back pain: Secondary | ICD-10-CM | POA: Diagnosis not present

## 2017-10-17 DIAGNOSIS — M545 Low back pain: Secondary | ICD-10-CM | POA: Diagnosis not present

## 2017-10-24 DIAGNOSIS — M545 Low back pain: Secondary | ICD-10-CM | POA: Diagnosis not present

## 2017-10-25 DIAGNOSIS — M25551 Pain in right hip: Secondary | ICD-10-CM | POA: Diagnosis not present

## 2017-10-25 DIAGNOSIS — M25559 Pain in unspecified hip: Secondary | ICD-10-CM | POA: Diagnosis not present

## 2017-10-31 DIAGNOSIS — M545 Low back pain: Secondary | ICD-10-CM | POA: Diagnosis not present

## 2017-11-01 DIAGNOSIS — R1030 Lower abdominal pain, unspecified: Secondary | ICD-10-CM | POA: Diagnosis not present

## 2017-11-01 DIAGNOSIS — M25551 Pain in right hip: Secondary | ICD-10-CM | POA: Diagnosis not present

## 2017-11-01 DIAGNOSIS — M25559 Pain in unspecified hip: Secondary | ICD-10-CM | POA: Diagnosis not present

## 2017-11-01 DIAGNOSIS — R102 Pelvic and perineal pain: Secondary | ICD-10-CM | POA: Diagnosis not present

## 2017-11-08 DIAGNOSIS — M545 Low back pain: Secondary | ICD-10-CM | POA: Diagnosis not present

## 2017-11-08 DIAGNOSIS — M961 Postlaminectomy syndrome, not elsewhere classified: Secondary | ICD-10-CM | POA: Diagnosis not present

## 2017-11-08 DIAGNOSIS — G894 Chronic pain syndrome: Secondary | ICD-10-CM | POA: Diagnosis not present

## 2017-11-08 DIAGNOSIS — M48061 Spinal stenosis, lumbar region without neurogenic claudication: Secondary | ICD-10-CM | POA: Diagnosis not present

## 2017-11-08 DIAGNOSIS — Z79891 Long term (current) use of opiate analgesic: Secondary | ICD-10-CM | POA: Diagnosis not present

## 2017-11-08 DIAGNOSIS — M542 Cervicalgia: Secondary | ICD-10-CM | POA: Diagnosis not present

## 2017-11-09 ENCOUNTER — Other Ambulatory Visit: Payer: Self-pay

## 2017-11-09 ENCOUNTER — Telehealth: Payer: Self-pay | Admitting: Endocrinology

## 2017-11-09 MED ORDER — LEVOTHYROXINE SODIUM 25 MCG PO TABS
ORAL_TABLET | ORAL | 2 refills | Status: DC
Start: 2017-11-09 — End: 2017-11-12

## 2017-11-09 NOTE — Telephone Encounter (Signed)
Medication sent to pharmacy  

## 2017-11-09 NOTE — Telephone Encounter (Signed)
Refill of levothyroxine Courtney [name] [facility] [address], [place]  Patient stated she is getting her blood work drawn at he primary care office asking if Dr Dwyane Dee would send her lab orders over So she will not have to be stuck twice.      Dr Laurann Montana  Phone Fax    [phone]

## 2017-11-12 ENCOUNTER — Telehealth: Payer: Self-pay | Admitting: Endocrinology

## 2017-11-12 ENCOUNTER — Telehealth: Payer: Self-pay

## 2017-11-12 ENCOUNTER — Other Ambulatory Visit: Payer: Self-pay

## 2017-11-12 MED ORDER — LEVOTHYROXINE SODIUM 25 MCG PO TABS: ORAL_TABLET | ORAL | 2 refills | Status: AC

## 2017-11-12 MED ORDER — LEVOTHYROXINE SODIUM 25 MCG PO TABS: ORAL_TABLET | ORAL | 0 refills | Status: DC

## 2017-11-12 NOTE — Telephone Encounter (Signed)
Patient called in on Friday to have RX for Levothyroxine sent to [facility] but instead it was called into [facility] (which Patient does not know how that would have happened-she specifically stated she uses the above [facility]. Does not use [facility]. Please send RX for above medication to the above [facility]. Seeing Dr. Deon Pilling [facility] in our bldgon 11/21/17-if Dr. Dwyane Dee wants any blood work for thyroid please let Dr. Sabra Heck know which tests since Dr. Sabra Heck is going to be running labs on patient for other things Patient wants to make sure we are sending the correct message since she called Friday and her message was not sent back correctly (e.g. Wrong pharmacy)

## 2017-11-12 NOTE — Telephone Encounter (Signed)
Pt called and detailed voicemail left telling pt that her Rx was sent to the proper pharmacy and at this time, she does not have any labs to be drawn or any existing appointments. If she would like to schedule either of the two, she can call back and schedule any time she likes.

## 2017-11-12 NOTE — Telephone Encounter (Signed)
See previous note

## 2017-11-20 DIAGNOSIS — M25551 Pain in right hip: Secondary | ICD-10-CM | POA: Diagnosis not present

## 2017-11-21 DIAGNOSIS — R7303 Prediabetes: Secondary | ICD-10-CM | POA: Diagnosis not present

## 2017-11-21 DIAGNOSIS — E7800 Pure hypercholesterolemia, unspecified: Secondary | ICD-10-CM | POA: Diagnosis not present

## 2017-11-21 DIAGNOSIS — F329 Major depressive disorder, single episode, unspecified: Secondary | ICD-10-CM | POA: Diagnosis not present

## 2017-11-21 DIAGNOSIS — E039 Hypothyroidism, unspecified: Secondary | ICD-10-CM | POA: Diagnosis not present

## 2017-11-21 DIAGNOSIS — M545 Low back pain: Secondary | ICD-10-CM | POA: Diagnosis not present

## 2017-11-26 DIAGNOSIS — E039 Hypothyroidism, unspecified: Secondary | ICD-10-CM | POA: Diagnosis not present

## 2017-11-26 DIAGNOSIS — R7309 Other abnormal glucose: Secondary | ICD-10-CM | POA: Diagnosis not present

## 2017-11-26 DIAGNOSIS — E7800 Pure hypercholesterolemia, unspecified: Secondary | ICD-10-CM | POA: Diagnosis not present

## 2017-12-18 DIAGNOSIS — M48061 Spinal stenosis, lumbar region without neurogenic claudication: Secondary | ICD-10-CM | POA: Diagnosis not present

## 2018-01-01 DIAGNOSIS — M7072 Other bursitis of hip, left hip: Secondary | ICD-10-CM | POA: Diagnosis not present

## 2018-03-05 DIAGNOSIS — M7072 Other bursitis of hip, left hip: Secondary | ICD-10-CM | POA: Diagnosis not present

## 2018-03-05 DIAGNOSIS — M25551 Pain in right hip: Secondary | ICD-10-CM | POA: Diagnosis not present

## 2018-03-06 ENCOUNTER — Other Ambulatory Visit: Payer: Self-pay | Admitting: Orthopedic Surgery

## 2018-03-06 DIAGNOSIS — M25551 Pain in right hip: Secondary | ICD-10-CM

## 2018-03-07 DIAGNOSIS — M48061 Spinal stenosis, lumbar region without neurogenic claudication: Secondary | ICD-10-CM | POA: Diagnosis not present

## 2018-03-07 DIAGNOSIS — M961 Postlaminectomy syndrome, not elsewhere classified: Secondary | ICD-10-CM | POA: Diagnosis not present

## 2018-03-07 DIAGNOSIS — Z79891 Long term (current) use of opiate analgesic: Secondary | ICD-10-CM | POA: Diagnosis not present

## 2018-03-07 DIAGNOSIS — M545 Low back pain: Secondary | ICD-10-CM | POA: Diagnosis not present

## 2018-03-08 DIAGNOSIS — M48061 Spinal stenosis, lumbar region without neurogenic claudication: Secondary | ICD-10-CM | POA: Diagnosis not present

## 2018-03-12 DIAGNOSIS — M25551 Pain in right hip: Secondary | ICD-10-CM | POA: Diagnosis not present

## 2018-04-12 DIAGNOSIS — Z23 Encounter for immunization: Secondary | ICD-10-CM | POA: Diagnosis not present

## 2018-04-23 DIAGNOSIS — S42252D Displaced fracture of greater tuberosity of left humerus, subsequent encounter for fracture with routine healing: Secondary | ICD-10-CM | POA: Diagnosis not present

## 2018-05-13 DIAGNOSIS — M25551 Pain in right hip: Secondary | ICD-10-CM | POA: Diagnosis not present

## 2018-05-28 DIAGNOSIS — M533 Sacrococcygeal disorders, not elsewhere classified: Secondary | ICD-10-CM | POA: Diagnosis not present

## 2018-06-10 DIAGNOSIS — M9904 Segmental and somatic dysfunction of sacral region: Secondary | ICD-10-CM | POA: Diagnosis not present

## 2018-06-19 DIAGNOSIS — M722 Plantar fascial fibromatosis: Secondary | ICD-10-CM | POA: Diagnosis not present

## 2018-06-19 DIAGNOSIS — G5762 Lesion of plantar nerve, left lower limb: Secondary | ICD-10-CM | POA: Diagnosis not present

## 2018-06-24 DIAGNOSIS — R040 Epistaxis: Secondary | ICD-10-CM | POA: Diagnosis not present

## 2018-06-24 DIAGNOSIS — H93299 Other abnormal auditory perceptions, unspecified ear: Secondary | ICD-10-CM | POA: Diagnosis not present

## 2018-06-24 DIAGNOSIS — M272 Inflammatory conditions of jaws: Secondary | ICD-10-CM | POA: Diagnosis not present

## 2018-06-24 DIAGNOSIS — K219 Gastro-esophageal reflux disease without esophagitis: Secondary | ICD-10-CM | POA: Diagnosis not present

## 2018-06-24 DIAGNOSIS — G4733 Obstructive sleep apnea (adult) (pediatric): Secondary | ICD-10-CM | POA: Diagnosis not present

## 2018-06-24 DIAGNOSIS — R6884 Jaw pain: Secondary | ICD-10-CM | POA: Diagnosis not present

## 2018-06-24 DIAGNOSIS — J310 Chronic rhinitis: Secondary | ICD-10-CM | POA: Diagnosis not present

## 2018-06-27 DIAGNOSIS — K219 Gastro-esophageal reflux disease without esophagitis: Secondary | ICD-10-CM | POA: Diagnosis not present

## 2018-06-27 DIAGNOSIS — E7800 Pure hypercholesterolemia, unspecified: Secondary | ICD-10-CM | POA: Diagnosis not present

## 2018-06-27 DIAGNOSIS — R0989 Other specified symptoms and signs involving the circulatory and respiratory systems: Secondary | ICD-10-CM | POA: Diagnosis not present

## 2018-06-27 DIAGNOSIS — Z0000 Encounter for general adult medical examination without abnormal findings: Secondary | ICD-10-CM | POA: Diagnosis not present

## 2018-06-27 DIAGNOSIS — R7303 Prediabetes: Secondary | ICD-10-CM | POA: Diagnosis not present

## 2018-06-27 DIAGNOSIS — Z01411 Encounter for gynecological examination (general) (routine) with abnormal findings: Secondary | ICD-10-CM | POA: Diagnosis not present

## 2018-06-27 DIAGNOSIS — J309 Allergic rhinitis, unspecified: Secondary | ICD-10-CM | POA: Diagnosis not present

## 2018-06-27 DIAGNOSIS — E039 Hypothyroidism, unspecified: Secondary | ICD-10-CM | POA: Diagnosis not present

## 2018-06-27 DIAGNOSIS — M2550 Pain in unspecified joint: Secondary | ICD-10-CM | POA: Diagnosis not present

## 2018-06-27 DIAGNOSIS — F324 Major depressive disorder, single episode, in partial remission: Secondary | ICD-10-CM | POA: Diagnosis not present

## 2018-06-27 DIAGNOSIS — M79671 Pain in right foot: Secondary | ICD-10-CM | POA: Diagnosis not present

## 2018-06-28 DIAGNOSIS — J310 Chronic rhinitis: Secondary | ICD-10-CM | POA: Diagnosis not present

## 2018-06-28 DIAGNOSIS — J321 Chronic frontal sinusitis: Secondary | ICD-10-CM | POA: Diagnosis not present

## 2018-06-28 DIAGNOSIS — M272 Inflammatory conditions of jaws: Secondary | ICD-10-CM | POA: Diagnosis not present

## 2018-07-01 ENCOUNTER — Other Ambulatory Visit (HOSPITAL_BASED_OUTPATIENT_CLINIC_OR_DEPARTMENT_OTHER): Payer: Self-pay

## 2018-07-01 DIAGNOSIS — M961 Postlaminectomy syndrome, not elsewhere classified: Secondary | ICD-10-CM | POA: Diagnosis not present

## 2018-07-01 DIAGNOSIS — G4733 Obstructive sleep apnea (adult) (pediatric): Secondary | ICD-10-CM

## 2018-07-01 DIAGNOSIS — G894 Chronic pain syndrome: Secondary | ICD-10-CM | POA: Diagnosis not present

## 2018-07-01 DIAGNOSIS — M542 Cervicalgia: Secondary | ICD-10-CM | POA: Diagnosis not present

## 2018-07-01 DIAGNOSIS — M545 Low back pain: Secondary | ICD-10-CM | POA: Diagnosis not present

## 2018-07-04 DIAGNOSIS — M79671 Pain in right foot: Secondary | ICD-10-CM | POA: Diagnosis not present

## 2018-07-15 ENCOUNTER — Ambulatory Visit (HOSPITAL_COMMUNITY): Payer: Self-pay | Admitting: Orthopedic Surgery

## 2018-07-16 ENCOUNTER — Ambulatory Visit (HOSPITAL_BASED_OUTPATIENT_CLINIC_OR_DEPARTMENT_OTHER): Payer: Medicare Other | Attending: Otolaryngology | Admitting: Internal Medicine

## 2018-07-16 VITALS — Ht 63.0 in | Wt 147.0 lb

## 2018-07-16 DIAGNOSIS — G4733 Obstructive sleep apnea (adult) (pediatric): Secondary | ICD-10-CM | POA: Diagnosis not present

## 2018-07-17 ENCOUNTER — Other Ambulatory Visit: Payer: Self-pay

## 2018-07-17 ENCOUNTER — Encounter (HOSPITAL_COMMUNITY)
Admission: RE | Admit: 2018-07-17 | Discharge: 2018-07-17 | Disposition: A | Discharge status: Home / Self Care | Payer: Medicare Other | Source: Ambulatory Visit | Attending: Orthopedic Surgery | Admitting: Orthopedic Surgery

## 2018-07-17 ENCOUNTER — Encounter (HOSPITAL_COMMUNITY): Payer: Self-pay

## 2018-07-17 DIAGNOSIS — Z01818 Encounter for other preprocedural examination: Secondary | ICD-10-CM | POA: Insufficient documentation

## 2018-07-17 LAB — CBC
HCT: 38.7 % (ref 36.0–46.0)
Hemoglobin: 12.7 g/dL (ref 12.0–15.0)
MCH: 31.2 pg (ref 26.0–34.0)
MCHC: 32.8 g/dL (ref 30.0–36.0)
MCV: 95.1 fL (ref 80.0–100.0)
NRBC: 0 % (ref 0.0–0.2)
PLATELETS: 179 10*3/uL (ref 150–400)
RBC: 4.07 MIL/uL (ref 3.87–5.11)
RDW: 11.4 % — ABNORMAL LOW (ref 11.5–15.5)
WBC: 5.7 10*3/uL (ref 4.0–10.5)

## 2018-07-17 LAB — BASIC METABOLIC PANEL
Anion gap: 9 (ref 5–15)
BUN: 17 mg/dL (ref 8–23)
CO2: 25 mmol/L (ref 22–32)
Calcium: 9.5 mg/dL (ref 8.9–10.3)
Chloride: 105 mmol/L (ref 98–111)
Creatinine, Ser: 0.7 mg/dL (ref 0.44–1.00)
GFR calc Af Amer: >60 mL/min (ref >60)
GFR calc non Af Amer: >60 mL/min (ref >60)
Glucose, Bld: 107 mg/dL — ABNORMAL HIGH (ref 70–99)
Potassium: 4.3 mmol/L (ref 3.5–5.1)
Sodium: 139 mmol/L (ref 135–145)

## 2018-07-17 LAB — TYPE AND SCREEN
ABO/RH(D): O POS
Antibody Screen: NEGATIVE

## 2018-07-17 LAB — ABO/RH: ABO/RH(D): O POS

## 2018-07-17 LAB — SURGICAL PCR SCREEN
MRSA, PCR: NEGATIVE
Staphylococcus aureus: NEGATIVE

## 2018-07-17 NOTE — Pre-Procedure Instructions (Signed)
Leonora Gores Seder  07/17/2018      [facility] DRUG STORE [place], [place] - [address] [place] [address] Phone: [phone] Fax: [phone]    Your procedure is scheduled on July 24, 2018  Report to [facility] Admitting at 630 AM.  Call this number if you have problems the morning of surgery:  [phone]   Remember:  Do not eat or drink after midnight.   Take these medicines the morning of surgery with A SIP OF WATER  DULoxetine (CYMBALTA)  fluticasone (FLONASE) 50 MCG/ACT nasal spray levothyroxine (SYNTHROID, LEVOTHROID) loratadine (CLARITIN)  Oxycodone HCl 10 MG-if needed for pain ondansetron (ZOFRAN ODT) -if needed for nausea  Follow your surgeon's instructions on when to hold/resume aspirin.  If no instructions were given call the office to determine how they would like to you take aspirin   7 days prior to surgery STOP taking any diclofenac (voltaren) gel, meloxicam (mobic), Aleve, Naproxen, Ibuprofen, Motrin, Advil, Goody's, BC's, all herbal medications, fish oil, and all vitamins   Do not wear jewelry, make-up or nail polish.  Do not wear lotions, powders, or perfumes, or deodorant.  Do not shave 48 hours prior to surgery.   Do not bring valuables to the hospital.  [facility] is not responsible for any belongings or valuables.  Contacts, dentures or bridgework may not be worn into surgery.  Leave your suitcase in the car.  After surgery it may be brought to your room.  For patients admitted to the hospital, discharge time will be determined by your treatment team.  Patients discharged the day of surgery will not be allowed to drive home.    [place]- Preparing For Surgery  Before surgery, you can play an important role. Because skin is not sterile, your skin needs to be as free of germs as possible. You can reduce the number of germs on your skin by washing with CHG  (chlorahexidine gluconate) Soap before surgery.  CHG is an antiseptic cleaner which kills germs and bonds with the skin to continue killing germs even after washing.    Oral Hygiene is also important to reduce your risk of infection.  Remember - BRUSH YOUR TEETH THE MORNING OF SURGERY WITH YOUR REGULAR TOOTHPASTE  Please do not use if you have an allergy to CHG or antibacterial soaps. If your skin becomes reddened/irritated stop using the CHG.  Do not shave (including legs and underarms) for at least 48 hours prior to first CHG shower. It is OK to shave your face.  Please follow these instructions carefully.   1. Shower the NIGHT BEFORE SURGERY and the MORNING OF SURGERY with CHG.   2. If you chose to wash your hair, wash your hair first as usual with your normal shampoo.  3. After you shampoo, rinse your hair and body thoroughly to remove the shampoo.  4. Use CHG as you would any other liquid soap. You can apply CHG directly to the skin and wash gently with a scrungie or a clean washcloth.   5. Apply the CHG Soap to your body ONLY FROM THE NECK DOWN.  Do not use on open wounds or open sores. Avoid contact with your eyes, ears, mouth and genitals (private parts). Wash Face and genitals (private parts)  with your normal soap.  6. Wash thoroughly, paying special attention to the area where your surgery will be performed.  7.  Thoroughly rinse your body with warm water from the neck down.  8. DO NOT shower/wash with your normal soap after using and rinsing off the CHG Soap.  9. Pat yourself dry with a CLEAN TOWEL.  10. Wear CLEAN PAJAMAS to bed the night before surgery, wear comfortable clothes the morning of surgery  11. Place CLEAN SHEETS on your bed the night of your first shower and DO NOT SLEEP WITH PETS.   Day of Surgery:  Do not apply any deodorants/lotions.  Please wear clean clothes to the hospital/surgery center.   Remember to brush your teeth WITH YOUR REGULAR  TOOTHPASTE.   Please read over the following fact sheets that you were given.

## 2018-07-17 NOTE — Progress Notes (Signed)
PCP - Kelton Pillar Cardiologist - Claiborne Billings hasnt seen in about 4 years   Chest x-ray - not needed EKG - 07/17/18 Stress Test - denies ECHO - 2016  Cardiac Cath - denies  Sleep Study - pending home sleep study - completed with [facility] turned in today CPAP - yes in the past   Aspirin Instructions: stop 5 days  Anesthesia review: yes, recent sleep study results  Patient denies shortness of breath, fever, cough and chest pain at PAT appointment   Patient verbalized understanding of instructions that were given to them at the PAT appointment. Patient was also instructed that they will need to review over the PAT instructions again at home before surgery.

## 2018-07-18 ENCOUNTER — Other Ambulatory Visit: Payer: Self-pay | Admitting: Otolaryngology

## 2018-07-18 ENCOUNTER — Other Ambulatory Visit (HOSPITAL_COMMUNITY): Payer: Self-pay | Admitting: Otolaryngology

## 2018-07-18 DIAGNOSIS — M79641 Pain in right hand: Secondary | ICD-10-CM | POA: Diagnosis not present

## 2018-07-18 DIAGNOSIS — M272 Inflammatory conditions of jaws: Secondary | ICD-10-CM

## 2018-07-18 NOTE — Anesthesia Preprocedure Evaluation (Addendum)
Anesthesia Evaluation  Patient identified by MRN, date of birth, ID band Patient awake    Reviewed: Allergy & Precautions, NPO status , Patient's Chart, lab work & pertinent test results  Airway Mallampati: II  TM Distance: >3 FB Neck ROM: Full    Dental no notable dental hx. (+) Teeth Intact, Dental Advisory Given   Pulmonary sleep apnea , former smoker,    Pulmonary exam normal breath sounds clear to auscultation       Cardiovascular Exercise Tolerance: Good Normal cardiovascular exam+ Valvular Problems/Murmurs  Rhythm:Regular Rate:Normal  Bicuspid AV   Neuro/Psych negative neurological ROS  negative psych ROS   GI/Hepatic Neg liver ROS, GERD  ,  Endo/Other  Hypothyroidism   Renal/GU      Musculoskeletal  (+) Arthritis , Fibromyalgia -  Abdominal   Peds  Hematology   Anesthesia Other Findings   Reproductive/Obstetrics                           Lab Results  Component Value Date   WBC 5.7 07/17/2018   HGB 12.7 07/17/2018   HCT 38.7 07/17/2018   MCV 95.1 07/17/2018   PLT 179 07/17/2018    Anesthesia Physical Anesthesia Plan  ASA: II  Anesthesia Plan: General   Post-op Pain Management:    Induction: Intravenous  PONV Risk Score and Plan: 3 and Treatment may vary due to age or medical condition, Ondansetron, Dexamethasone and Midazolam  Airway Management Planned: Oral ETT  Additional Equipment:   Intra-op Plan:   Post-operative Plan: Extubation in OR  Informed Consent: I have reviewed the patients History and Physical, chart, labs and discussed the procedure including the risks, benefits and alternatives for the proposed anesthesia with the patient or authorized representative who has indicated his/her understanding and acceptance.     Dental advisory given  Plan Discussed with: CRNA  Anesthesia Plan Comments: (PAT note written 07/18/2018 by Myra Gianotti, PA-C. )        Anesthesia Quick Evaluation

## 2018-07-18 NOTE — Progress Notes (Addendum)
Anesthesia Chart Review:  Case:  [id number] Date/Time:  07/24/18 0815   Procedure:  SACROILIAC JOINT FUSION (Right ) - 90 mins   Anesthesia type:  General   Pre-op diagnosis:  Right sacroiliac dysfunction   Location:  [facility] OR ROOM 04 / [facility] OR   Surgeon:  Melina Schools, MD      DISCUSSION: Patient is a 68 year old female scheduled for the above procedure.  History includes former smoker, hyperlipidemia, bicuspid aortic valve (no AS/AR, normal ascending aorta by 01/2015 echo), depression, HLD, fibromyalgia, GERD, OSA (does not wear CPAP), parathyroidectomy (2012), back surgeries (L3-4 decompression 04/21/15;, L5-S1 laminectomy/foraminotomy 06/20/17), C5-7 ACDF (06/28/16). Reports she is sensitive to anesthesia. BMI 25.   Patient with bicuspid AV with otherwise normal echo < 4 years ago. I called and spoke with her, and she denied chest pain, SOB, edema, dizziness, syncope. Occasional rare palpitations ("skipped" or "premature" beat) which have not been bothersome. She has not been as active as she would like since her spinal issues started ~ 03/2016, but she still gardens regularly. She can do stairs, but it is painful (RLE) for her. She tolerated three surgeries in just over three years. I did review one guideline (J Thorac Cardiovasc Surg. 2018 Aug: 156(2): T51-V61) that suggest thoracic aorta surveillance imaging every 3-5 years for previous normal thoracic aorta with bicuspid AV by TTE. I encouraged her to discuss further with her PCP or re-establish with cardiology for more definitive recommendations that are specific for her. I did review with anesthesiologist Hoy Morn, MD who agreed that if patient asymptomatic then would anticipate she could proceed as planned given echo findings < 4 years ago.   VS: BP (!) 119/56 Comment: rechecked R arm  Pulse 90   Temp 36.9 C   Resp 18   Ht 5\' 3"  (1.6 m)   Wt 64.4 kg   LMP  (LMP Unknown)   SpO2 97%   BMI 25.14 kg/m    PROVIDERS: Kelton Pillar, MD  is PCP  - She is not currently followed by a cardiologist, but she saw Shelva Majestic, MD on 01/27/15 for preoperative evaluation for findings of a mild murmur that showed trivial MR and trivial PR and a bicuspid AV without stenosis or regurgitation. The aortic root and ascending aorta were normal in size.   LABS: Labs reviewed: Acceptable for surgery. (all labs ordered are listed, but only abnormal results are displayed)  Labs Reviewed  BASIC METABOLIC PANEL - Abnormal; Notable for the following components:      Result Value   Glucose, Bld 107 (*)    All other components within normal limits  CBC - Abnormal; Notable for the following components:   RDW 11.4 (*)    All other components within normal limits  SURGICAL PCR SCREEN  TYPE AND SCREEN  ABO/RH    EKG: 07/17/18: NSR, non-specific ST/T wave abnormality. I think EKG appears stable when compared to 06/22/16 tracing.   CV: Echo 02/10/15: - Left ventricle: The cavity size was normal. Wall thickness was normal. Systolic function was normal. The estimated ejection fraction was in the range of 55% to 60%. Wall motion was normal; there were no regional wall motion abnormalities. - Aortic valve: Bicuspid; mildly thickened leaflets. Transvalvular velocity was within the normal range. There was no stenosis. There was no significant regurgitation. - Aorta: The aorta was without evidence of coarctation. There was no dilation of the aortic sinuses. The aortic root was normal in size. The ascending aorta was  normal in size.   Past Medical History:  Diagnosis Date  . Arthritis   . Bicuspid aortic valve    bicuspid AV with no stenosis or regurgitation 01/2015 echo   . Complication of anesthesia    slow to wake up and lingers for a long time  . Depression   . Family history of adverse reaction to anesthesia    mom had skin reaction to anesthesia but cant remember what it was or exactly what happened  . Fibromyalgia   . GERD (gastroesophageal  reflux disease)   . Hyperlipidemia   . Hypothyroidism   . Sleep apnea    does not wear CPAP    Past Surgical History:  Procedure Laterality Date  . ABDOMINOPLASTY    . ANTERIOR CERVICAL DECOMP/DISCECTOMY FUSION N/A 06/28/2016   Procedure: ACDF C5-7 ANTERIOR CERVICAL DECOMPRESSION/DISCECTOMY FUSION 2 LEVELS;  Surgeon: Melina Schools, MD;  Location: [facility];  Service: Orthopedics;  Laterality: N/A;  Requests 3 hrs  . COLONOSCOPY    . DENTAL SURGERY    . HERNIA REPAIR     times 2  . LUMBAR LAMINECTOMY/DECOMPRESSION MICRODISCECTOMY  04/21/2015   Procedure: DECOMPRESSION LUMBAR THREE-LUMBAR FOUR;  Surgeon: Melina Schools, MD;  Location: [facility];  Service: Orthopedics;;  . LUMBAR LAMINECTOMY/DECOMPRESSION MICRODISCECTOMY N/A 06/20/2017   Procedure: L4-5 decompression, L5-S1 left laminotomy/foraminotomy;  Surgeon: Melina Schools, MD;  Location: [facility];  Service: Orthopedics;  Laterality: N/A;  3 hrs  . PARATHYROIDECTOMY  2012  . SHOULDER ARTHROSCOPY W/ ACROMIAL REPAIR Right   . TONSILLECTOMY      MEDICATIONS: . Ascorbic Acid (VITAMIN C) 1000 MG tablet  . aspirin EC 81 MG tablet  . atorvastatin (LIPITOR) 10 MG tablet  . atorvastatin (LIPITOR) 40 MG tablet  . Cholecalciferol (VITAMIN D3) 2000 units TABS  . Coenzyme Q10 300 MG CAPS  . conjugated estrogens (PREMARIN) vaginal cream  . diclofenac sodium (VOLTAREN) 1 % GEL  . DULoxetine (CYMBALTA) 30 MG capsule  . EPINEPHrine (EPIPEN JR 2-PAK) 0.15 MG/0.3ML injection  . FERROUS BISGLYCINATE CHELATE PO  . fluticasone (FLONASE) 50 MCG/ACT nasal spray  . lactobacillus acidophilus (BACID) TABS tablet  . levothyroxine (SYNTHROID, LEVOTHROID) 25 MCG tablet  . levothyroxine (SYNTHROID, LEVOTHROID) 25 MCG tablet  . lidocaine (LIDODERM) 5 %  . loratadine (CLARITIN) 10 MG tablet  . MAGNESIUM CITRATE PO  . meloxicam (MOBIC) 15 MG tablet  . Multiple Vitamins-Minerals (MULTIVITAMIN WITH MINERALS) tablet  . Omega-3 Fatty Acids (OMEGA 3 PO)  . ondansetron  (ZOFRAN ODT) 4 MG disintegrating tablet  . Oxycodone HCl 10 MG TABS  . Potassium 99 MG TABS  . traZODone (DESYREL) 100 MG tablet  . urea (CARMOL) 40 % CREA  . vitamin E 400 UNIT capsule   No current facility-administered medications for this encounter.   She reported instructions to hold ASA 5 days prior to surgery.    Myra Gianotti, PA-C Surgical Short Stay/Anesthesiology [facility] Phone [phone] [facility] Phone [phone] 07/18/2018 3:45 PM

## 2018-07-19 ENCOUNTER — Other Ambulatory Visit: Payer: Self-pay | Admitting: Obstetrics and Gynecology

## 2018-07-19 ENCOUNTER — Other Ambulatory Visit (HOSPITAL_COMMUNITY)
Admission: RE | Admit: 2018-07-19 | Discharge: 2018-07-19 | Disposition: A | Discharge status: Home / Self Care | Payer: Medicare Other | Source: Ambulatory Visit | Attending: Obstetrics and Gynecology | Admitting: Obstetrics and Gynecology

## 2018-07-19 DIAGNOSIS — N904 Leukoplakia of vulva: Secondary | ICD-10-CM | POA: Diagnosis not present

## 2018-07-19 DIAGNOSIS — Z113 Encounter for screening for infections with a predominantly sexual mode of transmission: Secondary | ICD-10-CM | POA: Diagnosis not present

## 2018-07-19 DIAGNOSIS — Z01411 Encounter for gynecological examination (general) (routine) with abnormal findings: Secondary | ICD-10-CM | POA: Diagnosis not present

## 2018-07-19 DIAGNOSIS — Z01419 Encounter for gynecological examination (general) (routine) without abnormal findings: Secondary | ICD-10-CM | POA: Diagnosis not present

## 2018-07-19 DIAGNOSIS — Z124 Encounter for screening for malignant neoplasm of cervix: Secondary | ICD-10-CM | POA: Diagnosis not present

## 2018-07-19 DIAGNOSIS — L900 Lichen sclerosus et atrophicus: Secondary | ICD-10-CM | POA: Diagnosis not present

## 2018-07-19 DIAGNOSIS — N950 Postmenopausal bleeding: Secondary | ICD-10-CM | POA: Diagnosis not present

## 2018-07-19 DIAGNOSIS — N952 Postmenopausal atrophic vaginitis: Secondary | ICD-10-CM | POA: Diagnosis not present

## 2018-07-22 DIAGNOSIS — Z113 Encounter for screening for infections with a predominantly sexual mode of transmission: Secondary | ICD-10-CM | POA: Diagnosis not present

## 2018-07-22 DIAGNOSIS — N950 Postmenopausal bleeding: Secondary | ICD-10-CM | POA: Diagnosis not present

## 2018-07-22 DIAGNOSIS — Z01411 Encounter for gynecological examination (general) (routine) with abnormal findings: Secondary | ICD-10-CM | POA: Diagnosis not present

## 2018-07-22 LAB — CYTOLOGY - PAP
Diagnosis: NEGATIVE
HPV: NOT DETECTED

## 2018-07-24 ENCOUNTER — Encounter (HOSPITAL_COMMUNITY)
Admission: RE | Disposition: A | Discharge status: Home / Self Care | Payer: Self-pay | Source: Home / Self Care | Attending: Orthopedic Surgery

## 2018-07-24 ENCOUNTER — Ambulatory Visit (HOSPITAL_COMMUNITY): Payer: Medicare Other | Admitting: Physician Assistant

## 2018-07-24 ENCOUNTER — Observation Stay (HOSPITAL_COMMUNITY)
Admission: RE | Admit: 2018-07-24 | Discharge: 2018-07-25 | Disposition: A | Discharge status: Home / Self Care | Payer: Medicare Other | Attending: Orthopedic Surgery | Admitting: Orthopedic Surgery

## 2018-07-24 ENCOUNTER — Ambulatory Visit (HOSPITAL_COMMUNITY): Payer: Medicare Other | Admitting: Anesthesiology

## 2018-07-24 ENCOUNTER — Ambulatory Visit (HOSPITAL_COMMUNITY): Payer: Medicare Other

## 2018-07-24 ENCOUNTER — Encounter (HOSPITAL_COMMUNITY): Payer: Self-pay | Admitting: Anesthesiology

## 2018-07-24 DIAGNOSIS — E7800 Pure hypercholesterolemia, unspecified: Secondary | ICD-10-CM | POA: Insufficient documentation

## 2018-07-24 DIAGNOSIS — Z792 Long term (current) use of antibiotics: Secondary | ICD-10-CM | POA: Insufficient documentation

## 2018-07-24 DIAGNOSIS — G4730 Sleep apnea, unspecified: Secondary | ICD-10-CM | POA: Diagnosis not present

## 2018-07-24 DIAGNOSIS — Z87891 Personal history of nicotine dependence: Secondary | ICD-10-CM | POA: Insufficient documentation

## 2018-07-24 DIAGNOSIS — Z79890 Hormone replacement therapy (postmenopausal): Secondary | ICD-10-CM | POA: Insufficient documentation

## 2018-07-24 DIAGNOSIS — M961 Postlaminectomy syndrome, not elsewhere classified: Secondary | ICD-10-CM | POA: Insufficient documentation

## 2018-07-24 DIAGNOSIS — Z79891 Long term (current) use of opiate analgesic: Secondary | ICD-10-CM | POA: Insufficient documentation

## 2018-07-24 DIAGNOSIS — M533 Sacrococcygeal disorders, not elsewhere classified: Principal | ICD-10-CM | POA: Diagnosis present

## 2018-07-24 DIAGNOSIS — Z7951 Long term (current) use of inhaled steroids: Secondary | ICD-10-CM | POA: Insufficient documentation

## 2018-07-24 DIAGNOSIS — M4328 Fusion of spine, sacral and sacrococcygeal region: Secondary | ICD-10-CM | POA: Diagnosis not present

## 2018-07-24 DIAGNOSIS — G894 Chronic pain syndrome: Secondary | ICD-10-CM | POA: Insufficient documentation

## 2018-07-24 DIAGNOSIS — Q231 Congenital insufficiency of aortic valve: Secondary | ICD-10-CM | POA: Insufficient documentation

## 2018-07-24 DIAGNOSIS — Z79899 Other long term (current) drug therapy: Secondary | ICD-10-CM | POA: Diagnosis not present

## 2018-07-24 DIAGNOSIS — F329 Major depressive disorder, single episode, unspecified: Secondary | ICD-10-CM | POA: Insufficient documentation

## 2018-07-24 DIAGNOSIS — E785 Hyperlipidemia, unspecified: Secondary | ICD-10-CM | POA: Insufficient documentation

## 2018-07-24 DIAGNOSIS — M1990 Unspecified osteoarthritis, unspecified site: Secondary | ICD-10-CM | POA: Insufficient documentation

## 2018-07-24 DIAGNOSIS — M797 Fibromyalgia: Secondary | ICD-10-CM | POA: Insufficient documentation

## 2018-07-24 DIAGNOSIS — J45909 Unspecified asthma, uncomplicated: Secondary | ICD-10-CM | POA: Diagnosis not present

## 2018-07-24 DIAGNOSIS — E039 Hypothyroidism, unspecified: Secondary | ICD-10-CM | POA: Insufficient documentation

## 2018-07-24 DIAGNOSIS — Z419 Encounter for procedure for purposes other than remedying health state, unspecified: Secondary | ICD-10-CM

## 2018-07-24 HISTORY — PX: SACROILIAC JOINT FUSION: SHX6088

## 2018-07-24 SURGERY — SACROILIAC JOINT FUSION
Anesthesia: General | Site: Back | Laterality: Right

## 2018-07-24 MED ORDER — PHENYLEPHRINE 40 MCG/ML (10ML) SYRINGE FOR IV PUSH (FOR BLOOD PRESSURE SUPPORT)
PREFILLED_SYRINGE | INTRAVENOUS | Status: AC
  Filled 2018-07-24: qty 10

## 2018-07-24 MED ORDER — DEXAMETHASONE SODIUM PHOSPHATE 10 MG/ML IJ SOLN
INTRAMUSCULAR | Status: AC
  Filled 2018-07-24: qty 1

## 2018-07-24 MED ORDER — ONDANSETRON HCL 4 MG/2ML IJ SOLN
INTRAMUSCULAR | Status: DC | PRN
  Administered 2018-07-24: 4 mg via INTRAVENOUS

## 2018-07-24 MED ORDER — FENTANYL CITRATE (PF) 250 MCG/5ML IJ SOLN
INTRAMUSCULAR | Status: AC
  Filled 2018-07-24: qty 5

## 2018-07-24 MED ORDER — EPHEDRINE SULFATE-NACL 50-0.9 MG/10ML-% IV SOSY
PREFILLED_SYRINGE | INTRAVENOUS | Status: DC | PRN
  Administered 2018-07-24 (×2): 10 mg via INTRAVENOUS

## 2018-07-24 MED ORDER — SODIUM CHLORIDE 0.9% FLUSH: 3.0000 mL | INTRAVENOUS | Status: DC | PRN

## 2018-07-24 MED ORDER — CEFAZOLIN SODIUM-DEXTROSE 2-4 GM/100ML-% IV SOLN
2.0000 g | INTRAVENOUS | Status: AC
  Administered 2018-07-24: 2 g via INTRAVENOUS

## 2018-07-24 MED ORDER — DULOXETINE HCL 30 MG PO CPEP
60.0000 mg | ORAL_CAPSULE | Freq: Every day | ORAL | Status: DC
  Administered 2018-07-25: 60 mg via ORAL
  Filled 2018-07-24: qty 2

## 2018-07-24 MED ORDER — ROCURONIUM BROMIDE 10 MG/ML (PF) SYRINGE
PREFILLED_SYRINGE | INTRAVENOUS | Status: DC | PRN
  Administered 2018-07-24: 50 mg via INTRAVENOUS

## 2018-07-24 MED ORDER — PROPOFOL 10 MG/ML IV BOLUS
INTRAVENOUS | Status: DC | PRN
  Administered 2018-07-24: 150 mg via INTRAVENOUS

## 2018-07-24 MED ORDER — CEFAZOLIN SODIUM-DEXTROSE 2-4 GM/100ML-% IV SOLN
INTRAVENOUS | Status: AC
  Filled 2018-07-24: qty 100

## 2018-07-24 MED ORDER — ONDANSETRON HCL 4 MG/2ML IJ SOLN
INTRAMUSCULAR | Status: AC
  Filled 2018-07-24: qty 2

## 2018-07-24 MED ORDER — LACTATED RINGERS IV SOLN
INTRAVENOUS | Status: DC | PRN
  Administered 2018-07-24 (×2): via INTRAVENOUS

## 2018-07-24 MED ORDER — ACETAMINOPHEN 10 MG/ML IV SOLN: 1000.0000 mg | Freq: Once | INTRAVENOUS | Status: DC | PRN

## 2018-07-24 MED ORDER — EPHEDRINE 5 MG/ML INJ
INTRAVENOUS | Status: AC
  Filled 2018-07-24: qty 10

## 2018-07-24 MED ORDER — ONDANSETRON HCL 4 MG/2ML IJ SOLN: 4.0000 mg | Freq: Four times a day (QID) | INTRAMUSCULAR | Status: DC | PRN

## 2018-07-24 MED ORDER — ONDANSETRON HCL 4 MG/2ML IJ SOLN: 4.0000 mg | Freq: Once | INTRAMUSCULAR | Status: DC | PRN

## 2018-07-24 MED ORDER — OXYCODONE HCL 5 MG PO TABS
10.0000 mg | ORAL_TABLET | ORAL | Status: DC | PRN
  Administered 2018-07-24 – 2018-07-25 (×5): 10 mg via ORAL
  Filled 2018-07-24 (×6): qty 2

## 2018-07-24 MED ORDER — BUPIVACAINE LIPOSOME 1.3 % IJ SUSP
20.0000 mL | INTRAMUSCULAR | Status: AC
  Administered 2018-07-24: 20 mL
  Filled 2018-07-24: qty 20

## 2018-07-24 MED ORDER — ONDANSETRON HCL 4 MG PO TABS: 4.0000 mg | ORAL_TABLET | Freq: Three times a day (TID) | ORAL | 0 refills | Status: DC | PRN

## 2018-07-24 MED ORDER — ACETAMINOPHEN 325 MG PO TABS: 650.0000 mg | ORAL_TABLET | ORAL | Status: DC | PRN

## 2018-07-24 MED ORDER — HYDROCODONE-ACETAMINOPHEN 7.5-325 MG PO TABS
1.0000 | ORAL_TABLET | Freq: Once | ORAL | Status: AC | PRN
  Administered 2018-07-24: 1 via ORAL

## 2018-07-24 MED ORDER — LACTATED RINGERS IV SOLN
INTRAVENOUS | Status: DC
  Administered 2018-07-24: 13:00:00 via INTRAVENOUS

## 2018-07-24 MED ORDER — SODIUM CHLORIDE 0.9 % IV SOLN: 250.0000 mL | INTRAVENOUS | Status: DC

## 2018-07-24 MED ORDER — 0.9 % SODIUM CHLORIDE (POUR BTL) OPTIME
TOPICAL | Status: DC | PRN
  Administered 2018-07-24: 1000 mL

## 2018-07-24 MED ORDER — ATORVASTATIN CALCIUM 10 MG PO TABS
10.0000 mg | ORAL_TABLET | ORAL | Status: DC
  Filled 2018-07-24: qty 1

## 2018-07-24 MED ORDER — MEPERIDINE HCL 50 MG/ML IJ SOLN: 6.2500 mg | INTRAMUSCULAR | Status: DC | PRN

## 2018-07-24 MED ORDER — ONDANSETRON HCL 4 MG PO TABS: 4.0000 mg | ORAL_TABLET | Freq: Four times a day (QID) | ORAL | Status: DC | PRN

## 2018-07-24 MED ORDER — FENTANYL CITRATE (PF) 100 MCG/2ML IJ SOLN
INTRAMUSCULAR | Status: DC | PRN
  Administered 2018-07-24: 100 ug via INTRAVENOUS

## 2018-07-24 MED ORDER — OXYCODONE-ACETAMINOPHEN 10-325 MG PO TABS: 1.0000 | ORAL_TABLET | Freq: Four times a day (QID) | ORAL | 0 refills | Status: AC | PRN

## 2018-07-24 MED ORDER — SUGAMMADEX SODIUM 200 MG/2ML IV SOLN
INTRAVENOUS | Status: DC | PRN
  Administered 2018-07-24: 150 mg via INTRAVENOUS

## 2018-07-24 MED ORDER — HYDROCODONE-ACETAMINOPHEN 7.5-325 MG PO TABS
ORAL_TABLET | ORAL | Status: AC
  Administered 2018-07-24: 1 via ORAL
  Filled 2018-07-24: qty 1

## 2018-07-24 MED ORDER — TRAZODONE HCL 50 MG PO TABS
50.0000 mg | ORAL_TABLET | Freq: Every evening | ORAL | Status: DC | PRN
  Administered 2018-07-24: 100 mg via ORAL
  Filled 2018-07-24 (×2): qty 2
  Filled 2018-07-24: qty 1

## 2018-07-24 MED ORDER — EPINEPHRINE PF 1 MG/10ML IJ SOSY: 0.1500 mg | PREFILLED_SYRINGE | INTRAMUSCULAR | Status: DC | PRN

## 2018-07-24 MED ORDER — ACETAMINOPHEN 650 MG RE SUPP: 650.0000 mg | RECTAL | Status: DC | PRN

## 2018-07-24 MED ORDER — MENTHOL 3 MG MT LOZG: 1.0000 | LOZENGE | OROMUCOSAL | Status: DC | PRN

## 2018-07-24 MED ORDER — HYDROMORPHONE HCL 1 MG/ML IJ SOLN
0.2500 mg | INTRAMUSCULAR | Status: DC | PRN
  Administered 2018-07-24 (×2): 0.5 mg via INTRAVENOUS

## 2018-07-24 MED ORDER — ROCURONIUM BROMIDE 50 MG/5ML IV SOSY
PREFILLED_SYRINGE | INTRAVENOUS | Status: AC
  Filled 2018-07-24: qty 5

## 2018-07-24 MED ORDER — ACETAMINOPHEN 10 MG/ML IV SOLN
INTRAVENOUS | Status: AC
  Filled 2018-07-24: qty 100

## 2018-07-24 MED ORDER — SODIUM CHLORIDE 0.9% FLUSH: 3.0000 mL | Freq: Two times a day (BID) | INTRAVENOUS | Status: DC

## 2018-07-24 MED ORDER — DEXAMETHASONE SODIUM PHOSPHATE 10 MG/ML IJ SOLN
INTRAMUSCULAR | Status: DC | PRN
  Administered 2018-07-24: 10 mg via INTRAVENOUS

## 2018-07-24 MED ORDER — EPINEPHRINE 0.15 MG/0.3ML IJ SOAJ: 0.1500 mg | INTRAMUSCULAR | Status: DC | PRN

## 2018-07-24 MED ORDER — MIDAZOLAM HCL 2 MG/2ML IJ SOLN
INTRAMUSCULAR | Status: AC
  Filled 2018-07-24: qty 2

## 2018-07-24 MED ORDER — METHOCARBAMOL 500 MG PO TABS: 500.0000 mg | ORAL_TABLET | Freq: Three times a day (TID) | ORAL | 0 refills | Status: AC | PRN

## 2018-07-24 MED ORDER — CEFAZOLIN SODIUM-DEXTROSE 1-4 GM/50ML-% IV SOLN
1.0000 g | Freq: Three times a day (TID) | INTRAVENOUS | Status: AC
  Administered 2018-07-24 (×2): 1 g via INTRAVENOUS
  Filled 2018-07-24 (×2): qty 50

## 2018-07-24 MED ORDER — OXYCODONE HCL 5 MG PO TABS: 5.0000 mg | ORAL_TABLET | ORAL | Status: DC | PRN

## 2018-07-24 MED ORDER — LIDOCAINE 2% (20 MG/ML) 5 ML SYRINGE
INTRAMUSCULAR | Status: DC | PRN
  Administered 2018-07-24: 100 mg via INTRAVENOUS

## 2018-07-24 MED ORDER — PROPOFOL 10 MG/ML IV BOLUS
INTRAVENOUS | Status: AC
  Filled 2018-07-24: qty 20

## 2018-07-24 MED ORDER — BUPIVACAINE-EPINEPHRINE 0.25% -1:200000 IJ SOLN
INTRAMUSCULAR | Status: DC | PRN
  Administered 2018-07-24: 20 mL
  Administered 2018-07-24: 10 mL

## 2018-07-24 MED ORDER — PHENOL 1.4 % MT LIQD: 1.0000 | OROMUCOSAL | Status: DC | PRN

## 2018-07-24 MED ORDER — LIDOCAINE 2% (20 MG/ML) 5 ML SYRINGE
INTRAMUSCULAR | Status: AC
  Filled 2018-07-24: qty 5

## 2018-07-24 MED ORDER — PHENYLEPHRINE 40 MCG/ML (10ML) SYRINGE FOR IV PUSH (FOR BLOOD PRESSURE SUPPORT)
PREFILLED_SYRINGE | INTRAVENOUS | Status: DC | PRN
  Administered 2018-07-24 (×7): 80 ug via INTRAVENOUS

## 2018-07-24 MED ORDER — METHOCARBAMOL 1000 MG/10ML IJ SOLN
500.0000 mg | Freq: Four times a day (QID) | INTRAVENOUS | Status: DC | PRN
  Filled 2018-07-24: qty 5

## 2018-07-24 MED ORDER — POLYETHYLENE GLYCOL 3350 17 G PO PACK: 17.0000 g | PACK | Freq: Every day | ORAL | Status: DC | PRN

## 2018-07-24 MED ORDER — POTASSIUM 99 MG PO TABS: 99.0000 mg | ORAL_TABLET | Freq: Every evening | ORAL | Status: DC

## 2018-07-24 MED ORDER — HYDROMORPHONE HCL 1 MG/ML IJ SOLN
INTRAMUSCULAR | Status: AC
  Administered 2018-07-24: 0.5 mg via INTRAVENOUS
  Filled 2018-07-24: qty 1

## 2018-07-24 MED ORDER — LEVOTHYROXINE SODIUM 75 MCG PO TABS
37.5000 ug | ORAL_TABLET | Freq: Every day | ORAL | Status: DC
  Administered 2018-07-25: 37.5 ug via ORAL
  Filled 2018-07-24: qty 1

## 2018-07-24 MED ORDER — METHOCARBAMOL 500 MG PO TABS
500.0000 mg | ORAL_TABLET | Freq: Four times a day (QID) | ORAL | Status: DC | PRN
  Administered 2018-07-24 – 2018-07-25 (×3): 500 mg via ORAL
  Filled 2018-07-24 (×3): qty 1

## 2018-07-24 MED ORDER — ACETAMINOPHEN 10 MG/ML IV SOLN
1000.0000 mg | Freq: Once | INTRAVENOUS | Status: AC
  Administered 2018-07-24: 1000 mg via INTRAVENOUS

## 2018-07-24 SURGICAL SUPPLY — 48 items
BLADE SURG 11 STRL SS (BLADE) ×3 IMPLANT
CLOSURE STERI-STRIP 1/2X4 (GAUZE/BANDAGES/DRESSINGS) ×1
CLSR STERI-STRIP ANTIMIC 1/2X4 (GAUZE/BANDAGES/DRESSINGS) ×2 IMPLANT
COVER SURGICAL LIGHT HANDLE (MISCELLANEOUS) ×3 IMPLANT
COVER WAND RF STERILE (DRAPES) ×3 IMPLANT
DRAPE C-ARM 42X72 X-RAY (DRAPES) ×3 IMPLANT
DRAPE C-ARMOR (DRAPES) ×3 IMPLANT
DRAPE SURG 17X23 STRL (DRAPES) ×3 IMPLANT
DRAPE U-SHAPE 47X51 STRL (DRAPES) ×3 IMPLANT
DRSG OPSITE POSTOP 3X4 (GAUZE/BANDAGES/DRESSINGS) IMPLANT
DRSG OPSITE POSTOP 4X6 (GAUZE/BANDAGES/DRESSINGS) ×3 IMPLANT
DURAPREP 26ML APPLICATOR (WOUND CARE) ×3 IMPLANT
ELECT BLADE 4.0 EZ CLEAN MEGAD (MISCELLANEOUS) ×3
ELECT PENCIL ROCKER SW 15FT (MISCELLANEOUS) ×3 IMPLANT
ELECT REM PT RETURN 9FT ADLT (ELECTROSURGICAL) ×3
ELECTRODE BLDE 4.0 EZ CLN MEGD (MISCELLANEOUS) ×1 IMPLANT
ELECTRODE REM PT RTRN 9FT ADLT (ELECTROSURGICAL) ×1 IMPLANT
GLOVE BIOGEL PI IND STRL 8.5 (GLOVE) ×1 IMPLANT
GLOVE BIOGEL PI INDICATOR 8.5 (GLOVE) ×2
GLOVE SS BIOGEL STRL SZ 8.5 (GLOVE) ×1 IMPLANT
GLOVE SUPERSENSE BIOGEL SZ 8.5 (GLOVE) ×2
GLOVE SURG SS PI 7.0 STRL IVOR (GLOVE) ×3 IMPLANT
GOWN STRL REUS W/ TWL LRG LVL3 (GOWN DISPOSABLE) ×2 IMPLANT
GOWN STRL REUS W/TWL 2XL LVL3 (GOWN DISPOSABLE) ×3 IMPLANT
GOWN STRL REUS W/TWL LRG LVL3 (GOWN DISPOSABLE) ×4
IMPL IFUSE 3D 7X35 (Rod) ×1 IMPLANT
IMPL IFUSE 7.0X45 (Rod) ×1 IMPLANT
IMPL IFUSE 7.0X50 (Rod) ×1 IMPLANT
IMPLANT IFUSE 3D 7X35 (Rod) ×3 IMPLANT
IMPLANT IFUSE 7.0X45 (Rod) ×3 IMPLANT
IMPLANT IFUSE 7.0X50 (Rod) ×3 IMPLANT
KIT BASIN OR (CUSTOM PROCEDURE TRAY) ×3 IMPLANT
KIT TURNOVER KIT B (KITS) ×3 IMPLANT
NEEDLE 22X1 1/2 (OR ONLY) (NEEDLE) ×3 IMPLANT
NS IRRIG 1000ML POUR BTL (IV SOLUTION) ×3 IMPLANT
PACK LAMINECTOMY ORTHO (CUSTOM PROCEDURE TRAY) ×3 IMPLANT
PACK UNIVERSAL I (CUSTOM PROCEDURE TRAY) ×3 IMPLANT
PAD ARMBOARD 7.5X6 YLW CONV (MISCELLANEOUS) ×9 IMPLANT
POSITIONER HEAD PRONE TRACH (MISCELLANEOUS) ×3 IMPLANT
STAPLER VISISTAT 35W (STAPLE) ×3 IMPLANT
SUT MNCRL AB 3-0 PS2 18 (SUTURE) IMPLANT
SUT VIC AB 1 CT1 18XCR BRD 8 (SUTURE) ×1 IMPLANT
SUT VIC AB 1 CT1 8-18 (SUTURE) ×2
SUT VIC AB 2-0 CT1 18 (SUTURE) ×3 IMPLANT
SYR BULB IRRIGATION 50ML (SYRINGE) ×3 IMPLANT
SYR CONTROL 10ML LL (SYRINGE) ×3 IMPLANT
TOWEL OR 17X26 10 PK STRL BLUE (TOWEL DISPOSABLE) ×3 IMPLANT
YANKAUER SUCT BULB TIP NO VENT (SUCTIONS) ×3 IMPLANT

## 2018-07-24 NOTE — Anesthesia Procedure Notes (Signed)
Procedure Name: Intubation Date/Time: 07/24/2018 8:35 AM Performed by: Jenne Campus, CRNA Pre-anesthesia Checklist: Patient identified, Emergency Drugs available, Suction available and Patient being monitored Patient Re-evaluated:Patient Re-evaluated prior to induction Oxygen Delivery Method: Circle System Utilized Preoxygenation: Pre-oxygenation with 100% oxygen Induction Type: IV induction Ventilation: Mask ventilation without difficulty Laryngoscope Size: Miller and 2 Grade View: Grade II Tube type: Oral Tube size: 7.0 mm Number of attempts: 1 Airway Equipment and Method: Stylet and Oral airway Placement Confirmation: ETT inserted through vocal cords under direct vision,  positive ETCO2 and breath sounds checked- equal and bilateral Secured at: 21 cm Tube secured with: Tape Dental Injury: Teeth and Oropharynx as per pre-operative assessment

## 2018-07-24 NOTE — Op Note (Signed)
Operative report  Preoperative diagnosis: Right sacroiliac dysfunction  Postoperative diagnosis: Same  Operative procedure: Right sacroiliac fusion  Assistant: Cleta Alberts, PA  Implants: iFuse 3D implant system.  Top screw: 7.0 x 50 mm.  Middle screw: 7.0 x 45 mm.  Bottom: 7.0 x 35 mm  Complications: None  Condition: Stable  Indications: This is a very pleasant 68 year old woman is well-known to the practice.  She presented to me with complaints of significant right SI joint pain.  This was confirmed with diagnostic injection therapy.  Despite physical therapy, activity modification, and therapeutic injection therapy she continued to deteriorate.  As result in the loss of quality of life we elected to move forward with surgery.  All appropriate risks benefits and alternatives were discussed with the patient and consent was obtained.  Operative report patient is brought the operating room placed upon the operating room table.  After successful induction of general anesthesia and endotracheal ovation teds SCDs were applied she was turned prone onto the operative [facility] flat table.  A gel roll was placed into the pelvis and chest and all bony prominences were well-padded.  The right gluteal region was prepped and draped in a standard fashion.  Timeout was taken to confirm patient procedure and all other important data.  The anatomical bony landmarks of the sacroiliac joint were then marked out in the lateral plane.  Small stab incision was made and a guidepin was advanced percutaneously down to the lateral aspect of the iliac wing.  Using lateral fluoroscopy identified the proper starting position and using a mallet I advanced the guidepin into the iliac wing.  I then switched my view to the inlet and outlet view to confirm the overall trajectory.  As I near the actual SI joint I confirmed that I was going with into the mid substance of the sacrum and I was not deviating anteriorly or posteriorly.  I  also confirmed that as I entered into the sacrum I remained lateral to the foramen.  Once the first guidepin was placed I then used the targeting device and placed the second and third pin.  I used the same technique to advance the guide pins under fluoroscopic guidance.  Once all 3 guide pins were placed I confirmed with the inlet, outlet, and lateral view they were all properly positioned.  They remained lateral to the foramen and within the substance of the sacrum.  There was no anterior posterior deviation of the pins.  Using an 11 blade scalpel I then made a single incision connecting all 3 guide pins.  I then placed the targeting device over the top pin measured and then broached the iliac wing and across the joint and into the sacrum.  I then placed the 50 mm length 7.0 diameter I fuse 3D implant.  I malleted across the iliac wing and SI joint and into the sacrum.  I made sure to remain lateral to the foramen.  The guidepin was removed and I confirmed satisfactory position in all 3 planes.  I then repeated this exact procedure at the middle and bottom pins.  Once all 3 implants were in place I took final x-rays.  The inlet and outlet views demonstrated satisfactory positioning of all 3 implants.  They remained lateral to the foramen and all across the sacroiliac joint.  There was no breach of the sacrum into the pelvis or posteriorly into the canal.  In the lateral view they were all properly lined up properly.  At this  point time the wound was copiously irrigated with normal saline and the deep fascia was closed with interrupted #1 Vicryl sutures.  Using Marcaine quarter percent with epi mixed with Exparel I anesthetize the periosteum and the deep subcutaneous tissue for improved postoperative analgesia.  The superficial layer was closed with 2-0 Vicryl suture and the skin was closed with a 3-0 Monocryl.  Steri-Strips and a dry dressing were applied and the patient was ultimately extubated transfer the  PACU without incident.  The end of the case all needle sponge counts were correct.

## 2018-07-24 NOTE — H&P (Signed)
History of Present Illness The patient is here today for a pre-operative History and Physical. They are scheduled for right SI joint fusion on 07-24-18 with Dr. Rolena Infante at [facility]. She has obtained pre-operative clearance from her PCP. She has not set up an appointment with PT to learn how to use her walker yet. Currently on oxycodone 10 as prescribed by Dr. Nelva Bush. In addition to meloxicam.  Allergies CRAYFISH  TREE NUT  NKDA  Medications amoxicillin 500 mg capsule atorvastatin 40 mg tablet Climara Pro 0.045 mg-0.015 mg/24 hr transdermal patch DULoxetine 30 mg capsule,delayed release Fluzone High-Dose 2019-20 (PF) 180 mcg/0.5 mL intramuscular syringe levothyroxine 25 mcg tablet oxyCODONE 10 mg tablet Shingrix (PF) 50 mcg/0.5 mL intramuscular suspension, kit         Problems Chronic pain syndrome - Onset: 07/11/2017 Cervical post-laminectomy syndrome - Onset: 07/11/2017 Lumbar post-laminectomy syndrome - Onset: 07/11/2017 Chronic neck pain - Onset: 07/11/2017 Spinal stenosis of lumbar region - Onset: 06/19/2017 Chronic low back pain - Onset: 07/11/2017 Postoperative care - Onset: 07/02/2017 Osteoarthritis of joint of right shoulder region - Onset: 08/14/2017 Impingement syndrome of left shoulder region - Onset: 08/14/2017 Pain in right hip joint - Onset: 08/21/2017 Pain of left hand - Onset: 09/11/2017 Pain in right hand - Onset: 09/11/2017 Pain in right foot - Onset: 06/27/2018  Family History Mother - Arthritis   - Heart disease Father - Arthritis  Social History Tobacco Smoking Status: Former smoker  Surgical History Lumbar Laminectomy - 06/12/2017 GYN History Most Recent Bone Density: 06/12/2016. Most Recent Mammogram: 03/12/2017.  Past Medical History ADD/ADHD: Y Anemia: Y Asthma: Y Chronic Back Pain: Y Depression: Y Fibromyalgia: Y GERD/Reflux: Y Headaches: Y High Cholesterol: Y Joint Pain: Y Migraines: Y Numbness/Tingling: Y Osteoarthritis:  Y Previous Cortisone Injection(s): Y Previous Oral Steroid(s): Y Sleep apnea: Y Thyroid Problems/Goiter: Y  Physical Exam General: AAOX3, well developed and well nourished, NAD Heart: RRR, nbo rubs, murmers, or gallops  Lungs: CTAB  Abdomen: Normal bowel soundsX4, soft, non-tender, no hepatosplenomegaly.   ROM: -Spine: normal ROM, pain elicited with fwd flexion and extension.  - Knee: flexion and extension normal and pain free bilaterally.  - Ankle: Dorsiflexion, plantarflexion, inversion, eversion normal and pain free.   Dermatomes: Lower extremity sensation to light touch intact bilaterally with positive dysathesias on the left.  Myotomes:   - Hip Flexion: Left 5/5, Right 5/5  -Hip Adduction: Left 5/5, Right 5/5  - Knee Extension: Left 5/5, Right 5/5  - Knee Flexion: Left 5/5, Right 5/5  - Ankle Dorsiflextion: Left 5/5, Right 5/5  - Ankle Eversion: Left 5/5, Right 5/5  - Ankle Plantarflexion: Left 5/5, Right 5/5  Reflexes:   - Patella: Left2+, Right 2+  - Achilles: Left2+, Right 2+  - Babinski: Left Ngative, Right Negative  - Clonus: Negative  Special Tests:  -Faber + on Right side - Gleason test +  PV: Extremities warm and well profused. Posterior and dorsalis pedis pulse 2+ bilaterally, No pitting Edema, discoloration, calf tenderness, or palpable cords. Homan's negative bilaterally.   Assessment & Plan Diagnosis: Courtney [name] is a very pleasant 68 year old woman who is been under my care for some time now. She has had prior right SI marking only injections all with temporary near-complete relief of her symptoms. Despite doing self directed exercises and physical therapy her quality-of-life continues to deteriorate. She is expressing interest in moving forward with sacroiliac fusion.  I have gone over the surgical procedure as well as the risks and benefits with  her and all of her questions were addressed. Because of her underlying hand arthritis  postoperatively use a platform walker to allow her to be nonweightbearing on that right lower extremity for 6 weeks.  Risks and benefits of surgery were discussed with the patient. These include: Infection, bleeding, death, stroke, paralysis, ongoing or worse pain, need for additional surgery, nonunion, mal-position of the screws.  Goal of surgery: Decreased pain (not eliminate pain and), and improve her quality-of-life.  All questions were invited and answered

## 2018-07-24 NOTE — Brief Op Note (Signed)
07/24/2018  9:49 AM  PATIENT:  Courtney [name]  68 y.o. female  PRE-OPERATIVE DIAGNOSIS:  Right sacroiliac dysfunction  POST-OPERATIVE DIAGNOSIS:  Right sacroiliac dysfunction  PROCEDURE:  Procedure(s) with comments: SACROILIAC JOINT FUSION (Right) - 90 mins  SURGEON:  Surgeon(s) and Role:    Melina Schools, MD - Primary  PHYSICIAN ASSISTANT:   ASSISTANTS: Amanda Ward, PA   ANESTHESIA:   general  EBL:  10 mL   BLOOD ADMINISTERED:none  DRAINS: none   LOCAL MEDICATIONS USED:  MARCAINE    and OTHER exparel  SPECIMEN:  No Specimen  DISPOSITION OF SPECIMEN:  N/A  COUNTS:  YES  TOURNIQUET:  * No tourniquets in log *  DICTATION: .Dragon Dictation  PLAN OF CARE: Admit for overnight observation  PATIENT DISPOSITION:  PACU - hemodynamically stable.

## 2018-07-24 NOTE — Anesthesia Postprocedure Evaluation (Signed)
Anesthesia Post Note  Patient: Courtney [name]  Procedure(s) Performed: SACROILIAC JOINT FUSION (Right Back)     Patient location during evaluation: PACU Anesthesia Type: General Level of consciousness: awake and alert Pain management: pain level controlled Vital Signs Assessment: post-procedure vital signs reviewed and stable Respiratory status: spontaneous breathing, nonlabored ventilation, respiratory function stable and patient connected to nasal cannula oxygen Cardiovascular status: blood pressure returned to baseline and stable Postop Assessment: no apparent nausea or vomiting Anesthetic complications: no    Last Vitals:  Vitals:   07/24/18 1145 07/24/18 1204  BP: 99/60 (!) 111/53  Pulse: 76 74  Resp: 20 19  Temp: 36.6 C (!) 36.4 C  SpO2: 94% 100%    Last Pain:  Vitals:   07/24/18 1404  TempSrc:   PainSc: 7                  Barnet Glasgow

## 2018-07-24 NOTE — Transfer of Care (Signed)
Immediate Anesthesia Transfer of Care Note  Patient: Courtney [name]  Procedure(s) Performed: SACROILIAC JOINT FUSION (Right Back)  Patient Location: PACU  Anesthesia Type:General  Level of Consciousness: awake, oriented and patient cooperative  Airway & Oxygen Therapy: Patient Spontanous Breathing and Patient connected to nasal cannula oxygen  Post-op Assessment: Report given to RN and Post -op Vital signs reviewed and stable  Post vital signs: Reviewed  Last Vitals:  Vitals Value Taken Time  BP 120/51 07/24/2018 10:16 AM  Temp    Pulse 88 07/24/2018 10:17 AM  Resp 23 07/24/2018 10:17 AM  SpO2 100 % 07/24/2018 10:17 AM  Vitals shown include unvalidated device data.  Last Pain:  Vitals:   07/24/18 0736  TempSrc:   PainSc: 2          Complications: No apparent anesthesia complications

## 2018-07-24 NOTE — Discharge Instructions (Signed)
SI Fusion, Care After This sheet gives you information about how to care for yourself after your procedure. Your health care provider may also give you more specific instructions. If you have problems or questions, contact your health care provider. What can I expect after the procedure? After the procedure, it is common to have:  Some pain around your incision area.  Muscle tightening (spasms) across the back.   Follow these instructions at home: Incision care  Follow instructions from your health care provider about how to take care of your incision area. Make sure you: ? Wash your hands with soap and water before and after you apply medicine to the area or change your bandage (dressing). If soap and water are not available, use hand sanitizer. ? Change your dressing as told by your health care provider. ? Leave stitches (sutures), skin glue, or adhesive strips in place. These skin closures may need to stay in place for 2 weeks or longer. If adhesive strip edges start to loosen and curl up, you may trim the loose edges. Do not remove adhesive strips completely unless your health care provider tells you to do that.    Check your incision area every day for signs of infection. Check for: ? More redness, swelling, or pain. ? More fluid or blood. ? Warmth. ? Pus or a bad smell.  REMEMBER: CRUTCHES - DO NOT BEAR WEIGHT ON EFFECTED SIDE  Medicines  Take over-the-counter and prescription medicines only as told by your health care provider.  If you were prescribed an antibiotic medicine, use it as told by your health care provider. Do not stop using the antibiotic even if you start to feel better.  Pain medications have been electronically prescribed. Bathing  Do not take baths, swim, or use a hot tub for 6 weeks, or until your incision has healed completely.  If your health care provider approves, you may take showers after your dressing has been removed.  Ok to shower in five  (5) days Activity  Return to your normal activities as told by your health care provider. Ask your health care provider what activities are safe for you.  Avoid bending or twisting at your waist. Always bend at your knees.  Do not sit for more than 20-30 minutes at a time. Lie down or walk between periods of sitting.  Do not lift anything that is heavier than 10 lb (4.5 kg) or the limit that your health care provider tells you, until he or she says that it is safe.  Do not drive for 2 weeks after your procedure or for as long as your health care provider tells you.    Do not drive or use heavy machinery while taking prescription pain medicine.  DO NOT BEAR WEIGHT ON THE LEFT LEG General instructions  To prevent or treat constipation while you are taking prescription pain medicine, your health care provider may recommend that you: ? Drink enough fluid to keep your urine clear or pale yellow. ? Take over-the-counter or prescription medicines. ? Eat foods that are high in fiber, such as fresh fruits and vegetables, whole grains, and beans. ? Limit foods that are high in fat and processed sugars, such as fried and sweet foods.  Do breathing exercises as told.  Keep all follow-up visits as told by your health care provider. This is important. Contact a health care provider if:  You have more redness, swelling, or pain around your incision area.  Your incision feels warm to  the touch.  You are not able to return to activities or do exercises as told by your health care provider. Get help right away if:  You have: ? More fluid or blood coming from your incision area. ? Pus or a bad smell coming from your incision area. ? Chills or a fever. ? Episodes of dizziness or fainting while standing.  You develop a rash.  You develop shortness of breath or you have difficulty breathing.  You cannot control when you urinate or have a bowel movement.  You become weak.  You are not  able to use your legs. Summary  After the procedure, it is common to have some pain around your incision area. You may also have muscle tightening (spasms) across the back.  Follow instructions from your health care provider about how to care for your incision.  Do not lift anything that is heavier than 10 lb (4.5 kg) or the limit that your health care provider tells you, until he or she says that it is safe.  Contact your health care provider if you have more redness, swelling, or pain around your incision area or if your incision feels warm to the touch. These can be signs of infection. This information is not intended to replace advice given to you by your health care provider. Make sure you discuss any questions you have with your health care provider.

## 2018-07-25 ENCOUNTER — Encounter (HOSPITAL_COMMUNITY): Payer: Self-pay | Admitting: Orthopedic Surgery

## 2018-07-25 DIAGNOSIS — Q231 Congenital insufficiency of aortic valve: Secondary | ICD-10-CM | POA: Diagnosis not present

## 2018-07-25 DIAGNOSIS — F329 Major depressive disorder, single episode, unspecified: Secondary | ICD-10-CM | POA: Diagnosis not present

## 2018-07-25 DIAGNOSIS — M797 Fibromyalgia: Secondary | ICD-10-CM | POA: Diagnosis not present

## 2018-07-25 DIAGNOSIS — E7800 Pure hypercholesterolemia, unspecified: Secondary | ICD-10-CM | POA: Diagnosis not present

## 2018-07-25 DIAGNOSIS — E785 Hyperlipidemia, unspecified: Secondary | ICD-10-CM | POA: Diagnosis not present

## 2018-07-25 DIAGNOSIS — M533 Sacrococcygeal disorders, not elsewhere classified: Secondary | ICD-10-CM | POA: Diagnosis not present

## 2018-07-25 NOTE — Progress Notes (Signed)
OT Evaluation  Began education regarding ADL retraining and functional mobility regarding back precautions and NWB status RLE. Pt educated during ADL session. Will return prior to DC to complete education and demonstrate hoew to modify handles of crutches to increase hand comfort due to arthritis.    07/25/18 1300  OT Visit Information  Last OT Received On 07/25/18  Assistance Needed +1  History of Present Illness pt is a 68 yo female s/p right SI joint fusion. History includes former smoker, hyperlipidemia, bicuspid aortic valve (no AS/AR, normal ascending aorta by 01/2015 echo), depression, HLD, fibromyalgia, GERD, OSA (does not wear CPAP), parathyroidectomy (2012), back surgeries (L3-4 decompression 04/21/15;, L5-S1 laminectomy/foraminotomy 06/20/17), C5-7 ACDF (06/28/16).  Precautions  Precautions Back  Precaution Booklet Issued Yes (comment)  Precaution Comments no twisting; NWB RLE  Restrictions  Weight Bearing Restrictions Yes  RLE Weight Bearing NWB  Home Living  Family/patient expects to be discharged to: Private residence  Living Arrangements Alone;Spouse/significant other  Available Help at Discharge Family;Available 24 hours/day  Type of Home House  Home Access Stairs to enter  Entrance Stairs-Number of Steps 4 steps with handrail on side entrance, 2-3 small back patio steps without handrail for back entrance  Entrance Stairs-Rails Left  Home Layout Two level;Able to live on main level with bedroom/bathroom;Laundry or work area in basement (pt states she doesnt have to go down to basement for a while)  Alternate Level Stairs-Number of Steps flight  Alternate Level Stairs-Rails Left  Bathroom Shower/Tub Tub/shower unit;Walk-in shower  Bathroom Toilet Handicapped height  Bathroom Accessibility Yes  How Accessible Accessible via walker  Sandwich - 2 wheels;Cane - single point;BSC;Shower seat  Prior Function  Level of Independence Independent  Communication   Communication No difficulties  Pain Assessment  Pain Assessment 0-10  Pain Score 2  Pain Location right hip over incision and front of hip, left hip sore after ambulation secondary to RLE NWB  Pain Descriptors / Indicators Sore;Aching  Pain Intervention(s) Limited activity within patient's tolerance  Cognition  Arousal/Alertness Awake/alert  Behavior During Therapy WFL for tasks assessed/performed  Overall Cognitive Status Within Functional Limits for tasks assessed  Upper Extremity Assessment  Upper Extremity Assessment  (B hand arthritic changes but functional)  Lower Extremity Assessment  Lower Extremity Assessment Defer to PT evaluation  RLE Deficits / Details RLE NWB  RLE Unable to fully assess due to immobilization  Cervical / Trunk Assessment  Cervical / Trunk Assessment Other exceptions (SI fusion)  ADL  Overall ADL's  Needs assistance/impaired  Grooming Supervision/safety;Sitting  Upper Body Bathing Set up;Sitting  Lower Body Bathing Minimal assistance;Sit to/from stand  Upper Body Dressing  Set up;Sitting  Lower Body Dressing Moderate assistance;Sit to/from Environmental education officer guard;Ambulation;BSC  Toileting- Water quality scientist and Hygiene Supervision/safety  Tub/ Shower Transfer Minimal assistance  Functional mobility during ADLs Min guard;Rolling walker;Cueing for safety;Cueing for sequencing  General ADL Comments Educated pt on use of AE and DME during ADL session. Pt requires use o freacher, sock aid, long handled sponge and long handled shoe horn to comlete LB ADL. Also educated on safe shower techniques to increase safety with shower transfers. Recommend completing transfers only with friend present.  Recommend using 3in1 as shower seat due to handles for push off. Pt/friend able to return demonstrate. Pt states she feels more comfortable and safe using RW   Vision- History  Baseline Vision/History Wears glasses  Wears Glasses At all times  Bed Mobility   Overal bed mobility Modified  Independent  Transfers  Overall transfer level Needs assistance  Equipment used Rolling walker (2 wheeled);Lofstrands  Transfers Sit to/from Stand  Sit to [employer] transfer comment pt required cuing for hand placement on RW for sit to stand and stand to sit with crutches  Balance  Overall balance assessment Mild deficits observed, not formally tested  OT - End of Session  Equipment Utilized During Treatment Gait belt;Rolling walker  Activity Tolerance Patient tolerated treatment well  Patient left in bed;with call bell/phone within reach;with family/visitor present  Nurse Communication Mobility status;Other (comment) (DC plan)  OT Assessment  OT Recommendation/Assessment Patient needs continued OT Services  OT Visit Diagnosis Other abnormalities of gait and mobility (R26.89);Muscle weakness (generalized) (M62.81);Pain  Pain - Right/Left Right  Pain - part of body Hip  OT Problem List Decreased strength;Decreased range of motion;Decreased activity tolerance;Impaired balance (sitting and/or standing);Decreased safety awareness;Decreased knowledge of use of DME or AE;Decreased knowledge of precautions;Pain  OT Plan  OT Frequency (ACUTE ONLY) Min 2X/week  OT Treatment/Interventions (ACUTE ONLY) Self-care/ADL training;Therapeutic exercise;DME and/or AE instruction;Therapeutic activities;Patient/family education  AM-PAC OT "6 Clicks" Daily Activity Outcome Measure (Version 2)  Help from another person eating meals? 4  Help from another person taking care of personal grooming? 4  Help from another person toileting, which includes using toliet, bedpan, or urinal? 3  Help from another person bathing (including washing, rinsing, drying)? 3  Help from another person to put on and taking off regular upper body clothing? 4  Help from another person to put on and taking off regular lower body clothing? 3  6 Click Score 21  OT Recommendation  Follow Up  Recommendations No OT follow up;Supervision - Intermittent  OT Equipment 3 in 1 bedside commode;Other (comment) (RW)  Individuals Consulted  Consulted and Agree with Results and Recommendations Patient;Family member/caregiver  Family Member Consulted friend  Acute Rehab OT Goals  Patient Stated Goal be independentq  OT Goal Formulation With patient/family  Time For Goal Achievement 08/01/18  Potential to Achieve Goals Good  OT Time Calculation  OT Start Time (ACUTE ONLY) 1019  OT Stop Time (ACUTE ONLY) 1040  OT Time Calculation (min) 21 min  OT General Charges  $OT Visit 1 Visit  OT Evaluation  $OT Eval Low Complexity 1 Low  Written Expression  Dominant Hand Right  Maurie Boettcher, OT/L   Acute OT Clinical Specialist Acute Rehabilitation Services Pager [phone] Office [phone]

## 2018-07-25 NOTE — Progress Notes (Signed)
Pt doing well. Pt and family given D/C instructions with Rx's, verbal understanding was provided. Pt's incision is clean and dry with no sign of infection. Pt's IV was removed prior to D/C. Pt received RW and 3-n-1 from [place] prior to D/C. Pt D/C'd home via wheelchair @ 1200 per MD order. Pt is stable @ D/C and has no other needs at this time. Holli Humbles, RN

## 2018-07-25 NOTE — Progress Notes (Signed)
OT Treatment Note  Completed education regarding use of AE for ADL to maintain precautions. Pt issued foam tubing to adapt handles due to B hand weakness. Tubing dramatically helps. Also educated pt on availability of arthritic aids to assist with ADL. Pt very appreciative. No further OT needed at this time. Friend will be able to assist as needed.     07/25/18 1359  OT Visit Information  Last OT Received On 07/25/18  Assistance Needed +1  History of Present Illness pt is a 68 yo female s/p right SI joint fusion. History includes former smoker, hyperlipidemia, bicuspid aortic valve (no AS/AR, normal ascending aorta by 01/2015 echo), depression, HLD, fibromyalgia, GERD, OSA (does not wear CPAP), parathyroidectomy (2012), back surgeries (L3-4 decompression 04/21/15;, L5-S1 laminectomy/foraminotomy 06/20/17), C5-7 ACDF (06/28/16).  Precautions  Precautions Back  Precaution Booklet Issued Yes (comment)  Precaution Comments no twisting; NWB RLE  Pain Assessment  Pain Assessment 0-10  Pain Score 3  Pain Location right hip over incision and front of hip, left hip sore after ambulation secondary to RLE NWB  Pain Descriptors / Indicators Sore;Aching  Pain Intervention(s) Limited activity within patient's tolerance  Cognition  Arousal/Alertness Awake/alert  Behavior During Therapy WFL for tasks assessed/performed  Overall Cognitive Status Within Functional Limits for tasks assessed  Upper Extremity Assessment  Upper Extremity Assessment Generalized weakness  ADL  General ADL Comments Pt issued elastic showe strings due to being unable to bend over to tie shoes. verbalized understanding of use. Reviewed shower transfer technique; demonstrated how to adapt RW and loftstrand crutch handles to increase comofrt due to hand OA. Pt voicing conern about difficulty with other ADL tasks - pt given information on availability of AE to help with ADL tasks.   Restrictions  RLE Weight Bearing NWB  Exercises   Exercises Other exercises  Other Exercises  Other Exercises hand intrinsic strengthening  OT - End of Session  Equipment Utilized During Treatment Rolling walker  Activity Tolerance Patient tolerated treatment well  Patient left in bed;with call bell/phone within reach;with family/visitor present  Nurse Communication Other (comment) (DC needs)  OT Assessment/Plan  OT Plan All goals met and education completed, patient discharged from OT services  OT Visit Diagnosis Other abnormalities of gait and mobility (R26.89);Muscle weakness (generalized) (M62.81);Pain  Pain - Right/Left Right  Pain - part of body Hip  OT Frequency (ACUTE ONLY) Min 2X/week  Follow Up Recommendations No OT follow up;Supervision - Intermittent  OT Equipment 3 in 1 bedside commode;Other (comment)  AM-PAC OT "6 Clicks" Daily Activity Outcome Measure (Version 2)  Help from another person eating meals? 4  Help from another person taking care of personal grooming? 4  Help from another person toileting, which includes using toliet, bedpan, or urinal? 4  Help from another person bathing (including washing, rinsing, drying)? 4  Help from another person to put on and taking off regular upper body clothing? 4  Help from another person to put on and taking off regular lower body clothing? 4  6 Click Score 24  OT Goal Progression  Progress towards OT goals Goals met/education completed, patient discharged from OT  Acute Rehab OT Goals  Patient Stated Goal be independentq  OT Goal Formulation With patient/family  Time For Goal Achievement 08/01/18  Potential to Achieve Goals Good  OT Time Calculation  OT Start Time (ACUTE ONLY) 1100  OT Stop Time (ACUTE ONLY) 1115  OT Time Calculation (min) 15 min  OT General Charges  $OT Visit 1 Visit  OT Treatments  $Self Care/Home Management  8-22 mins  Maurie Boettcher, OT/L   Acute OT Clinical Specialist Acute Rehabilitation Services Pager [phone] Office [phone]

## 2018-07-25 NOTE — Progress Notes (Signed)
    Subjective: Procedure(s) (LRB): SACROILIAC JOINT FUSION (Right) 1 Day Post-Op  Patient reports pain as 2 on 0-10 scale.  Reports decreased leg pain reports incisional back pain   Positive void Negative bowel movement Positive flatus Negative chest pain or shortness of breath  Objective: Vital signs in last 24 hours: Temp:  [97.5 F (36.4 C)-98.7 F (37.1 C)] 98.7 F (37.1 C) (02/13 0719) Pulse Rate:  [59-76] 59 (02/13 0719) Resp:  [12-20] 16 (02/13 0719) BP: (85-116)/(39-65) 89/39 (02/13 0719) SpO2:  [90 %-100 %] 97 % (02/13 0719)  Intake/Output from previous day: 02/12 0701 - 02/13 0700 In: 2354.2 [P.O.:960; I.V.:1244.2; IV Piggyback:100] Out: 20 [Blood:20]  Labs: No results for input(s): WBC, RBC, HCT, PLT in the last 72 hours. No results for input(s): NA, K, CL, CO2, BUN, CREATININE, GLUCOSE, CALCIUM in the last 72 hours. No results for input(s): LABPT, INR in the last 72 hours.  Physical Exam: Neurologically intact ABD soft Intact pulses distally Incision: dressing C/D/I Compartment soft Body mass index is 24.98 kg/m.   Assessment/Plan: Patient stable  xrays n/a Continue mobilization with physical therapy Continue care  Advance diet Up with therapy  Doing well - plan on d/c to home  Melina Schools, MD [facility] [phone]

## 2018-07-25 NOTE — Evaluation (Signed)
Physical Therapy Evaluation Patient Details Name: Courtney [name] MRN: [id number] DOB: [birth year] Today's Date: 07/25/2018   History of Present Illness  pt is a 68 yo female s/p right SI joint fusion. PMH includes fibromyalgia, arthritis, shoulder arthroscopy and 2 lumbar laminectomies   Clinical Impression  Pt is a pleasant 68 yo female s/p SI joint fusion. Pt presents with pain, WB precautions and decreased functional mobility secondary to recent surgery. Pt was able to perform all mobility tasks during session while maintaining precautions. Ambulation and stair navigation was performed with both loftstrand crutches and RW with pt preferring the RW for ambulation and crutches for stairs. Pt was educated on techniques both types of DME with caregiver also present for session. Pt was given a gait belt for home use on stairs. Pt is safe to return home with her caregiver.     Follow Up Recommendations No PT follow up    Equipment Recommendations  None recommended by PT    Recommendations for Other Services OT consult     Precautions / Restrictions Precautions Precautions: Back Precaution Booklet Issued: Yes (comment) Restrictions Weight Bearing Restrictions: Yes RLE Weight Bearing: Non weight bearing      Mobility  Bed Mobility Overal bed mobility: Needs Assistance Bed Mobility: Rolling;Sidelying to Sit Rolling: Supervision Sidelying to sit: Supervision       General bed mobility comments: vc for maintaining precautions, using elbow to push up in sidelying   Transfers Overall transfer level: Needs assistance Equipment used: Rolling walker (2 wheeled);Lofstrands Transfers: Sit to/from [employer] to Stand: [employer] transfer comment: pt required cuing for hand placement on RW for sit to stand and stand to sit with crutches  Ambulation/Gait Ambulation/Gait assistance: Min guard Gait Distance (Feet): 30 Feet Assistive device: Rolling walker (2  wheeled);Lofstrands(trials with both RW and loftstrand crutches to assess pt comfort with both) Gait Pattern/deviations: Step-to pattern;Decreased stride length Gait velocity: decreased   General Gait Details: pt attempted ambulation with both RW and loftstrand crutches to assess pt safety and comfort with both devices while maintaining WB precautions, pt reported increased comfort and stability with RW but preferred and requested loftstrand crutches for up/down stairs  Stairs Stairs: Yes Stairs assistance: Min guard;+2 safety/equipment Stair Management: One rail Left;With crutches;Forwards;Step to pattern Number of Stairs: 4 General stair comments: pt required cuing for correct placement and negotiation of crutches on stairs, attempted trial of stairs with RW going backwards but pt did not feel comfortable to continue past one step and requested to just use crutches for stairs  Wheelchair Mobility    Modified Rankin (Stroke Patients Only)       Balance Overall balance assessment: Mild deficits observed, not formally tested                                           Pertinent Vitals/Pain Pain Assessment: Faces Faces Pain Scale: Hurts little more Pain Location: right hip over incision and front of hip, left hip sore after ambulation secondary to RLE NWB Pain Descriptors / Indicators: Sore;Aching Pain Intervention(s): Limited activity within patient's tolerance;Monitored during session;Patient requesting pain meds-RN notified    Home Living Family/patient expects to be discharged to:: Private residence Living Arrangements: Alone;Spouse/significant other Available Help at Discharge: Family;Available 24 hours/day Type of Home: House Home Access: Stairs to enter Entrance Stairs-Rails: Left Entrance Stairs-Number  of Steps: 4 steps with handrail on side entrance, 2-3 small back patio steps without handrail for back entrance Home Layout: Two level;Able to live on main  level with bedroom/bathroom;Laundry or work area in basement(pt states she doesnt have to go down to basement for a while) Home Equipment: Environmental consultant - 2 wheels;Cane - single point;Bedside commode;Shower seat      Prior Function Level of Independence: Independent with assistive device(s)               Hand Dominance        Extremity/Trunk Assessment   Upper Extremity Assessment Upper Extremity Assessment: Defer to OT evaluation    Lower Extremity Assessment Lower Extremity Assessment: RLE deficits/detail RLE Deficits / Details: RLE NWB RLE: Unable to fully assess due to immobilization       Communication   Communication: No difficulties  Cognition Arousal/Alertness: Awake/alert Behavior During Therapy: WFL for tasks assessed/performed Overall Cognitive Status: Within Functional Limits for tasks assessed                                        General Comments      Exercises     Assessment/Plan    PT Assessment Patient needs continued PT services  PT Problem List Decreased strength;Pain;Decreased activity tolerance;Decreased balance;Decreased mobility       PT Treatment Interventions Therapeutic exercise;DME instruction;Gait training;Balance training;Stair training;Neuromuscular re-education;Functional mobility training;Therapeutic activities;Patient/family education    PT Goals (Current goals can be found in the Care Plan section)  Acute Rehab PT Goals Patient Stated Goal: return home PT Goal Formulation: With patient Time For Goal Achievement: 08/01/18 Potential to Achieve Goals: Good    Frequency Min 5X/week   Barriers to discharge        Co-evaluation               AM-PAC PT "6 Clicks" Mobility  Outcome Measure Help needed turning from your back to your side while in a flat bed without using bedrails?: A Little Help needed moving from lying on your back to sitting on the side of a flat bed without using bedrails?: A  Little Help needed moving to and from a bed to a chair (including a wheelchair)?: A Little Help needed standing up from a chair using your arms (e.g., wheelchair or bedside chair)?: A Little Help needed to walk in hospital room?: A Little Help needed climbing 3-5 steps with a railing? : A Little 6 Click Score: 18    End of Session Equipment Utilized During Treatment: Gait belt Activity Tolerance: Patient tolerated treatment well Patient left: in bed;with call bell/phone within reach;with family/visitor present Nurse Communication: Mobility status PT Visit Diagnosis: Other abnormalities of gait and mobility (R26.89);Other symptoms and signs involving the nervous system (L38.101)    Time: 7510-2585 PT Time Calculation (min) (ACUTE ONLY): 36 min   Charges:   PT Evaluation $PT Eval Moderate Complexity: 1 Mod PT Treatments $Gait Training: 8-22 mins        Amya Hlad, [place] [phone]   Nathanie Ottley 07/25/2018, 11:13 AM

## 2018-07-28 DIAGNOSIS — G4733 Obstructive sleep apnea (adult) (pediatric): Secondary | ICD-10-CM | POA: Diagnosis not present

## 2018-07-28 NOTE — Procedures (Signed)
    Patient Name: Courtney [name], Courtney [name] Date: 07/17/2018 Gender: Female D.O.B: Jan 15, 1951 Age (years): 67 Referring Provider: Izora Gala Height (inches): 10 Interpreting Physician: Baird Lyons MD, ABSM Weight (lbs): 147 RPSGT: Jacolyn Reedy BMI: 26 MRN: [id number] Neck Size: 15.00  CLINICAL INFORMATION Sleep Study Type: HST Indication for sleep study: OSA Epworth Sleepiness Score: 9  SLEEP STUDY TECHNIQUE A multi-channel overnight portable sleep study was performed. The channels recorded were: nasal airflow, thoracic respiratory movement, and oxygen saturation with a pulse oximetry. Snoring was also monitored.  MEDICATIONS Patient self administered medications include: none reported  SLEEP ARCHITECTURE Patient was studied for 367.8 minutes. The sleep efficiency was 100.0 % and the patient was supine for 0%. The arousal index was 0.0 per hour.  RESPIRATORY PARAMETERS The overall AHI was 37.2 per hour, with a central apnea index of 0.0 per hour. The oxygen nadir was 82% during sleep.  CARDIAC DATA Mean heart rate during sleep was 58.7 bpm.  IMPRESSIONS - Severe obstructive sleep apnea occurred during this study (AHI = 37.2/h). - No significant central sleep apnea occurred during this study (CAI = 0.0/h). - Moderate oxygen desaturation was noted during this study (Min O2 = 82%). Mean 92%. - Patient snored.  DIAGNOSIS - Obstructive Sleep Apnea (327.23 [G47.33 ICD-10])  RECOMMENDATIONS - Suggest CPAP titration sleep study or autopap. Other options would be based on clinical judgment. - Be careful with alcohol, sedatives and other CNS depressants that may worsen sleep apnea and disrupt normal sleep architecture. - Sleep hygiene should be reviewed to assess factors that may improve sleep quality. - Weight management and regular exercise should be initiated or continued.  [Electronically signed] 07/28/2018 01:39 PM  Baird Lyons MD, [place], American  Board of Sleep Medicine   NPI: [id number]                         [place], [place] of Sleep [facility]  ELECTRONICALLY SIGNED ON:  07/28/2018, 1:37 PM [place] PH: [phone]   FX: [phone] [place]

## 2018-08-06 NOTE — Discharge Summary (Signed)
Patient ID: Courtney [name] MRN: [id number] DOB/AGE: [birth year] 68 y.o.  Admit date: 07/24/2018 Discharge date: 08/06/2018  Admission Diagnoses:  Active Problems:   SI (sacroiliac) pain   Discharge Diagnoses:  Active Problems:   SI (sacroiliac) pain  status post Procedure(s): SACROILIAC JOINT FUSION  Past Medical History:  Diagnosis Date  . Arthritis   . Bicuspid aortic valve    bicuspid AV with no stenosis or regurgitation 01/2015 echo   . Complication of anesthesia    slow to wake up and lingers for a long time  . Depression   . Family history of adverse reaction to anesthesia    mom had skin reaction to anesthesia but cant remember what it was or exactly what happened  . Fibromyalgia   . GERD (gastroesophageal reflux disease)   . Hyperlipidemia   . Hypothyroidism   . Sleep apnea    does not wear CPAP    Surgeries: Procedure(s): SACROILIAC JOINT FUSION on 07/24/2018   Consultants: None  Discharged Condition: Improved  Hospital Course: SHAELIN LALLEY is an 68 y.o. female who was admitted 07/24/2018 for operative treatment of Si joint dysfunction. Patient failed conservative treatments (please see the history and physical for the specifics) and had severe unremitting pain that affects sleep, daily activities and work/hobbies. After pre-op clearance, the patient was taken to the operating room on 07/24/2018 and underwent  Procedure(s): [place].    Patient was given perioperative antibiotics:  Anti-infectives (From admission, onward)   Start     Dose/Rate Route Frequency Ordered Stop   07/24/18 1215  ceFAZolin (ANCEF) IVPB 1 g/50 mL premix     1 g 100 mL/hr over 30 Minutes Intravenous Every 8 hours 07/24/18 1207 07/24/18 2030   07/24/18 0727  ceFAZolin (ANCEF) 2-4 GM/100ML-% IVPB    Note to Pharmacy:  Nyoka Cowden   : cabinet override      07/24/18 0727 07/24/18 0840   07/24/18 0721  ceFAZolin (ANCEF) IVPB 2g/100 mL premix     2 g 200  mL/hr over 30 Minutes Intravenous 30 min pre-op 07/24/18 0721 07/24/18 0855       Patient was given sequential compression devices and early ambulation to prevent DVT.   Patient benefited maximally from hospital stay and there were no complications. At the time of discharge, the patient was urinating/moving their bowels without difficulty, tolerating a regular diet, pain is controlled with oral pain medications and they have been cleared by PT/OT.   Hospital course was uneventful, no significant issues.   Recent vital signs: No data found.   Recent laboratory studies: No results for input(s): WBC, HGB, HCT, PLT, NA, K, CL, CO2, BUN, CREATININE, GLUCOSE, INR, CALCIUM in the last 72 hours.  Invalid input(s): PT, 2   Discharge Medications:   Allergies as of 07/25/2018      Reactions   Fish Allergy Anaphylaxis   Can tolerate shellfish    Peanuts [peanut Oil] Anaphylaxis   Can eat cashews, pistachios and almonds      Medication List    STOP taking these medications   aspirin EC 81 MG tablet   diclofenac sodium 1 % Gel Commonly known as:  VOLTAREN   lidocaine 5 % Commonly known as:  LIDODERM   meloxicam 15 MG tablet Commonly known as:  MOBIC   ondansetron 4 MG disintegrating tablet Commonly known as:  ZOFRAN ODT   Oxycodone HCl 10 MG Tabs     TAKE these medications   atorvastatin 10  MG tablet Commonly known as:  LIPITOR Take 10 mg by mouth every other day.   atorvastatin 40 MG tablet Commonly known as:  LIPITOR TAKE 1 TABLET DAILY   Coenzyme Q10 300 MG Caps Take 300 mg by mouth every other day.   conjugated estrogens vaginal cream Commonly known as:  PREMARIN Place 1 Applicatorful vaginally daily.   DULoxetine 30 MG capsule Commonly known as:  CYMBALTA Take 60 mg by mouth daily after breakfast.   EPIPEN JR 2-PAK 0.15 MG/0.3ML injection Generic drug:  EPINEPHrine Inject 0.15 mg into the muscle as needed for anaphylaxis.   FERROUS BISGLYCINATE CHELATE  PO Take 1 tablet by mouth every 3 (three) days.   fluticasone 50 MCG/ACT nasal spray Commonly known as:  FLONASE Place 2 sprays into both nostrils at bedtime.   lactobacillus acidophilus Tabs tablet Take 1 tablet by mouth daily.   levothyroxine 25 MCG tablet Commonly known as:  SYNTHROID, LEVOTHROID Take 1 1/2 tablets daily What changed:    how much to take  how to take this  when to take this  Another medication with the same name was removed. Continue taking this medication, and follow the directions you see here.   loratadine 10 MG tablet Commonly known as:  CLARITIN Take 10 mg by mouth daily as needed (for seasonal allergies (Spring/Summer)).   MAGNESIUM CITRATE PO Take 1,000 mg by mouth every evening.   multivitamin with minerals tablet Take 1 tablet by mouth every 3 (three) days.   OMEGA 3 PO Take 2,560 mg by mouth at bedtime.   ondansetron 4 MG tablet Commonly known as:  ZOFRAN Take 1 tablet (4 mg total) by mouth every 8 (eight) hours as needed for nausea or vomiting.   Potassium 99 MG Tabs Take 99 mg by mouth every evening.   traZODone 100 MG tablet Commonly known as:  DESYREL Take 50-100 mg by mouth at bedtime as needed for sleep.   urea 40 % Crea Commonly known as:  CARMOL Apply 1 application topically every other day.   vitamin C 1000 MG tablet Take 1,000 mg by mouth daily.   Vitamin D3 50 MCG (2000 UT) Tabs Take 2,000 Units by mouth every evening.   vitamin E 400 UNIT capsule Take 400 Units by mouth every 3 (three) days.     ASK your doctor about these medications   methocarbamol 500 MG tablet Commonly known as:  ROBAXIN Take 1 tablet (500 mg total) by mouth every 8 (eight) hours as needed for up to 5 days for muscle spasms. Ask about: Should I take this medication?   oxyCODONE-acetaminophen 10-325 MG tablet Commonly known as:  PERCOCET Take 1 tablet by mouth every 6 (six) hours as needed for up to 5 days for pain. Ask about: Should I  take this medication?       Diagnostic Studies: Dg Si Joints  Result Date: 07/24/2018 CLINICAL DATA:  RIGHT SI joint fusion. EXAM: DG C-ARM 61-120 MIN; BILATERAL SACROILIAC JOINTS - 3+ VIEW COMPARISON:  None. FINDINGS: Intraoperative views of the RIGHT SI joint are submitted postoperatively for interpretation. 3 screws are identified traversing the RIGHT SI joint. No gross complicating features identified. IMPRESSION: RIGHT SI joint fusion without definite complicating features. Electronically Signed   By: Margarette Canada M.D.   On: 07/24/2018 11:43   Dg C-arm 1-60 Min  Result Date: 07/24/2018 CLINICAL DATA:  RIGHT SI joint fusion. EXAM: DG C-ARM 61-120 MIN; BILATERAL SACROILIAC JOINTS - 3+ VIEW COMPARISON:  None. FINDINGS:  Intraoperative views of the RIGHT SI joint are submitted postoperatively for interpretation. 3 screws are identified traversing the RIGHT SI joint. No gross complicating features identified. IMPRESSION: RIGHT SI joint fusion without definite complicating features. Electronically Signed   By: Margarette Canada M.D.   On: 07/24/2018 11:43    Discharge Instructions    Incentive spirometry RT   Complete by:  As directed       Follow-up Information    Melina Schools, MD. Schedule an appointment as soon as possible for a visit in 2 weeks.   Specialty:  Orthopedic Surgery Why:  If symptoms worsen, For suture removal, For wound re-check Contact information: [address] [place] [address] [phone]           Discharge Plan:  discharge to home  Disposition: Stable    Signed: Yvonne Kendall Ward for [facility] PA-C [facility] [phone] 08/06/2018, 7:59 AM

## 2018-08-07 ENCOUNTER — Other Ambulatory Visit: Payer: Self-pay | Admitting: Endocrinology

## 2018-08-07 ENCOUNTER — Other Ambulatory Visit (HOSPITAL_COMMUNITY): Payer: Medicare Other

## 2018-08-07 ENCOUNTER — Encounter (HOSPITAL_COMMUNITY): Payer: Medicare Other

## 2018-08-21 ENCOUNTER — Encounter (HOSPITAL_COMMUNITY): Payer: Medicare Other

## 2018-08-21 ENCOUNTER — Encounter (HOSPITAL_COMMUNITY): Payer: Self-pay

## 2018-09-30 DIAGNOSIS — Z79891 Long term (current) use of opiate analgesic: Secondary | ICD-10-CM | POA: Diagnosis not present

## 2018-10-24 DIAGNOSIS — M533 Sacrococcygeal disorders, not elsewhere classified: Secondary | ICD-10-CM | POA: Diagnosis not present

## 2018-10-24 DIAGNOSIS — M25552 Pain in left hip: Secondary | ICD-10-CM | POA: Diagnosis not present

## 2018-11-13 ENCOUNTER — Telehealth: Payer: Self-pay | Admitting: Neurology

## 2018-11-13 NOTE — Telephone Encounter (Signed)
°  Due to current COVID 19 pandemic, our office is severely reducing in office visits until further notice, in order to minimize the risk to our patients and healthcare providers.    Called patient and scheduled a virtual visit with Dr. Jaynee Eagles for 6/23. Patient verbalized understanding of the doxy.me process and I have sent an e-mail to [email or web address] with link and instructions as well as my name and our office number. Patient understands that they will receive a call from RN to update chart.   Pt understands that although there may be some limitations with this type of visit, we will take all precautions to reduce any security or privacy concerns.  Pt understands that this will be treated like an in office visit and we will file with pt's insurance, and there may be a patient responsible charge related to this service.

## 2018-11-18 DIAGNOSIS — M533 Sacrococcygeal disorders, not elsewhere classified: Secondary | ICD-10-CM | POA: Diagnosis not present

## 2018-11-18 DIAGNOSIS — G894 Chronic pain syndrome: Secondary | ICD-10-CM | POA: Diagnosis not present

## 2018-11-18 DIAGNOSIS — M542 Cervicalgia: Secondary | ICD-10-CM | POA: Diagnosis not present

## 2018-11-18 DIAGNOSIS — M25552 Pain in left hip: Secondary | ICD-10-CM | POA: Diagnosis not present

## 2018-11-18 DIAGNOSIS — Z79899 Other long term (current) drug therapy: Secondary | ICD-10-CM | POA: Diagnosis not present

## 2018-11-18 DIAGNOSIS — M545 Low back pain: Secondary | ICD-10-CM | POA: Diagnosis not present

## 2018-11-18 DIAGNOSIS — M7062 Trochanteric bursitis, left hip: Secondary | ICD-10-CM | POA: Diagnosis not present

## 2018-11-18 DIAGNOSIS — M961 Postlaminectomy syndrome, not elsewhere classified: Secondary | ICD-10-CM | POA: Diagnosis not present

## 2018-11-18 DIAGNOSIS — Z5181 Encounter for therapeutic drug level monitoring: Secondary | ICD-10-CM | POA: Diagnosis not present

## 2018-12-02 ENCOUNTER — Encounter: Payer: Self-pay | Admitting: Neurology

## 2018-12-02 NOTE — Telephone Encounter (Signed)
Spoke with the patient and discussed the appt tomorrow. Pt will plan to come into the office for the visit. Her questions were answered. She passed the [place] screening questions, understands the check-in process, and will bring her own mask. She understands if she changes her mind before the end of the day we can revert back to doxy visit. Pt verbalized appreciation for the call.   COVID19 risk score 2.

## 2018-12-02 NOTE — Telephone Encounter (Signed)
Dr. Jaynee Eagles would like to see pt in office if pt willing to come in. I called her and her phone was busy. Will try again this afternoon.

## 2018-12-03 ENCOUNTER — Telehealth: Payer: Self-pay | Admitting: Neurology

## 2018-12-03 ENCOUNTER — Encounter: Payer: Self-pay | Admitting: Neurology

## 2018-12-03 ENCOUNTER — Other Ambulatory Visit: Payer: Self-pay

## 2018-12-03 ENCOUNTER — Ambulatory Visit (INDEPENDENT_AMBULATORY_CARE_PROVIDER_SITE_OTHER): Payer: Medicare Other | Admitting: Neurology

## 2018-12-03 VITALS — BP 117/67 | HR 63 | Temp 98.2°F | Ht 64.0 in | Wt 133.0 lb

## 2018-12-03 DIAGNOSIS — R102 Pelvic and perineal pain: Secondary | ICD-10-CM

## 2018-12-03 DIAGNOSIS — G5721 Lesion of femoral nerve, right lower limb: Secondary | ICD-10-CM | POA: Diagnosis not present

## 2018-12-03 DIAGNOSIS — R29898 Other symptoms and signs involving the musculoskeletal system: Secondary | ICD-10-CM

## 2018-12-03 DIAGNOSIS — R200 Anesthesia of skin: Secondary | ICD-10-CM | POA: Diagnosis not present

## 2018-12-03 DIAGNOSIS — R1907 Generalized intra-abdominal and pelvic swelling, mass and lump: Secondary | ICD-10-CM

## 2018-12-03 NOTE — Patient Instructions (Signed)
EM[facility]/NCS CT Abdomen and Pelvis

## 2018-12-03 NOTE — Telephone Encounter (Signed)
Request made today to [facility] requesting pt Cd.

## 2018-12-03 NOTE — Telephone Encounter (Signed)
Medicare/aarp order sent to GI. No auth they will reach out to the patient to schedule.  

## 2018-12-03 NOTE — Progress Notes (Signed)
[facility]    Provider:  Dr Jaynee Eagles Requesting Provider: Suella Broad MD Primary Care Provider:  Kelton Pillar, MD  CC:  Paresthesias  HPI:  Courtney [name] is a 68 y.o. female here as requested by Suella Broad MD for paresthesias.  She has a past medical history chronic pain syndrome, cervical postlaminectomy syndrome, lumbar postlaminectomy syndrome, chronic neck pain, spinal stenosis of lumbar region, chronic low back pain, osteoarthritis, impingement syndrome of the left shoulder region, pain in left and right hand, hip pain, sacroiliac joint pain.  She has had lumbar laminectomy in January 2019.3 years ago in October, she was gardening in the sun, long day, she tried to get up and her right leg was weak. She "toppled over". Dr. Rolena Infante checked her for stroke, MRI brain was negative. She couldn't lift the right leg, she couldn't drive,proximal weakness, numbness and tingling in the groin area and all the way down to the foot, sharp pain inner thigh. Over time it faded, the right leg is swollen, always colder often very cold and her foot is cold, it feels a little colder. Her foot feels cold. She had chronic back pain that preceded it all. The sensations were a feeling of numbness. She has been to a hip specialist to evaluate her right hip. She can still feel something (not pain) in the medial upper thigh, not shooting pain now but she does feel sensory changes in down to the right foot to the toes. Dr. Rolena Infante performed surgery on her low back for radiculopathy. The pain improved but not the sensations. Abdominal discomfort.   Reviewed notes, labs and imaging from outside physicians, which showed:  Personally reviewed images dg spine 06/20/2017: and agree Localizing instruments projecting over the L4 pedicle and S1-S2 level. Prior L3-L4 posterior decompression. Mild disc height loss and trace anterolisthesis at L3-L4. Moderate to severe disc height loss at L5-S1. No acute  osseous abnormality.  Reviewed emg/ncs report which was normal (see scanned)  I reviewed notes from Dr. Nelva Bush.  She is on Percocet and methocarbamol for pain management.  She is had hip injections as well into the SI joint due to sacrococcygeal disorders and pain in the left hip.  She is also on Cymbalta which can be used for pain.  She is a former smoker.  Patient underwent SI joint fusion by Dr. Rolena Infante.  Patient continues to have pain.  She is on chronic oxycodone.  Neuro intact with no focal deficits.  Improvement in the sciatic nerve pain she was having post and preoperatively.  Patient is ambulating.  Nonweightbearing on the right lower extremity she was using crutches.  Dr. Rolena Infante was very happy with the outcome.  Review of Systems: Patient complains of symptoms per HPI as well as the following symptoms: leg weakness and sensory changes, difficulty with gait. Pertinent negatives and positives per HPI. All others negative.   Social History   Socioeconomic History  . Marital status: Widowed    Spouse name: Not on file  . Number of children: 2  . Years of education: Master's   . Highest education level: Not on file  Occupational History  . Occupation: Retired   Scientific laboratory technician  . Financial resource strain: Not on file  . Food insecurity    Worry: Not on file    Inability: Not on file  . Transportation needs    Medical: Not on file    Non-medical: Not on file  Tobacco Use  . Smoking status: Former Research scientist (life sciences)  .  Smokeless tobacco: Never Used  . Tobacco comment: social smoker stop at age 65  Substance and Sexual Activity  . Alcohol use: Yes    Alcohol/week: 1.0 standard drinks    Types: 1 Glasses of wine per week    Comment: socially  . Drug use: No  . Sexual activity: Never  Lifestyle  . Physical activity    Days per week: Not on file    Minutes per session: Not on file  . Stress: Not on file  Relationships  . Social Herbalist on phone: Not on file    Gets  together: Not on file    Attends religious service: Not on file    Active member of club or organization: Not on file    Attends meetings of clubs or organizations: Not on file    Relationship status: Not on file  . Intimate partner violence    Fear of current or ex partner: Not on file    Emotionally abused: Not on file    Physically abused: Not on file    Forced sexual activity: Not on file  Other Topics Concern  . Not on file  Social History Narrative   Lives alone   Caffeine use: 1 cup coffee/day    Family History  Problem Relation Age of Onset  . COPD Mother   . Cancer Mother   . Hypertension Mother   . Hyperlipidemia Mother   . Heart disease Mother   . Thyroid disease Mother   . Thyroid disease Sister   . Thyroid disease Daughter     Past Medical History:  Diagnosis Date  . ADHD    ADD  . Allergic to pets    pet dander  . Allergies   . Anemia   . Arthritis   . Asthma   . Bicuspid aortic valve    bicuspid AV with no stenosis or regurgitation 01/2015 echo   . Complication of anesthesia    slow to wake up and lingers for a long time  . Concussion   . Depression   . Family history of adverse reaction to anesthesia    mom had skin reaction to anesthesia but cant remember what it was or exactly what happened  . Fatigue   . Fibromyalgia   . GERD (gastroesophageal reflux disease)   . Hyperlipidemia   . Hypothyroidism   . Irregular periods   . Joint pain   . Joint stiffness   . Menopause   . Migraines   . Numbness and tingling   . Pollen allergy   . Ringing in ears   . Sleep apnea    does not wear CPAP  . Snoring     Patient Active Problem List   Diagnosis Date Noted  . SI (sacroiliac) pain 07/24/2018  . Status post lumbar spine surgery for decompression of spinal cord 06/20/2017  . ADD (attention deficit disorder) 01/05/2017  . Asthma, extrinsic 01/05/2017  . GERD (gastroesophageal reflux disease) 01/05/2017  . Hypercalcemia 01/05/2017  . Insomnia  01/05/2017  . Internal hemorrhoids 01/05/2017  . Mixed incontinence urge and stress 11/22/2016  . Neck pain 06/28/2016  . Spinal stenosis 04/21/2015  . Preoperative clearance 01/29/2015  . Hyperlipidemia 01/29/2015  . H/O cardiac murmur 01/29/2015  . Lumbar facet arthropathy 10/27/2014  . Lumbar spondylosis with myelopathy 06/23/2014  . Spinal stenosis, lumbar region, with neurogenic claudication 06/23/2014  . Degenerative cervical disc 06/23/2014  . Cervical spondylosis without myelopathy 06/23/2014  . High  risk medication use 10/15/2013  . Facet arthropathy, cervical 08/28/2013  . Fatty liver 04/26/2013  . Hemangioma 04/09/2013  . DDD (degenerative disc disease), lumbosacral 12/31/2012  . Left hip pain 12/31/2012  . Lumbar radiculopathy 12/12/2012  . Left shoulder pain 08/07/2012  . Low back pain 08/07/2012  . H/O thyroid nodule 06/08/2012  . ADHD (attention deficit hyperactivity disorder) 12/24/2011  . Depression 07/11/2011  . Fatigue 07/11/2011    Past Surgical History:  Procedure Laterality Date  . ABDOMINOPLASTY    . ANTERIOR CERVICAL DECOMP/DISCECTOMY FUSION N/A 06/28/2016   Procedure: ACDF C5-7 ANTERIOR CERVICAL DECOMPRESSION/DISCECTOMY FUSION 2 LEVELS;  Surgeon: Melina Schools, MD;  Location: [place];  Service: Orthopedics;  Laterality: N/A;  Requests 3 hrs  . COLONOSCOPY    . DENTAL SURGERY    . HERNIA REPAIR     times 2  . LUMBAR LAMINECTOMY/DECOMPRESSION MICRODISCECTOMY  04/21/2015   Procedure: DECOMPRESSION LUMBAR THREE-LUMBAR FOUR;  Surgeon: Melina Schools, MD;  Location: [place];  Service: Orthopedics;;  . LUMBAR LAMINECTOMY/DECOMPRESSION MICRODISCECTOMY N/A 06/20/2017   Procedure: L4-5 decompression, L5-S1 left laminotomy/foraminotomy;  Surgeon: Melina Schools, MD;  Location: [place];  Service: Orthopedics;  Laterality: N/A;  3 hrs  . PARATHYROIDECTOMY  2012  . SACROILIAC JOINT FUSION Right 07/24/2018   Procedure: SACROILIAC JOINT FUSION;  Surgeon: Melina Schools, MD;   Location: [place];  Service: Orthopedics;  Laterality: Right;  90 mins  . SHOULDER ARTHROSCOPY W/ ACROMIAL REPAIR Right   . TONSILLECTOMY      Current Outpatient Medications  Medication Sig Dispense Refill  . Ascorbic Acid (VITAMIN C) 1000 MG tablet Take 1,000 mg by mouth daily.     Marland Kitchen atorvastatin (LIPITOR) 10 MG tablet Take 10 mg by mouth every other day.     . Cholecalciferol (VITAMIN D3) 2000 units TABS Take 2,000 Units by mouth every evening.    . Coenzyme Q10 300 MG CAPS Take 300 mg by mouth every other day.     . conjugated estrogens (PREMARIN) vaginal cream Place 1 Applicatorful vaginally. As needed    . diclofenac sodium (VOLTAREN) 1 % GEL Apply topically as needed.    . DULoxetine (CYMBALTA) 30 MG capsule Take 30 mg by mouth daily after breakfast.     . FERROUS BISGLYCINATE CHELATE PO Take 1 tablet by mouth every 3 (three) days.     . fluticasone (FLONASE) 50 MCG/ACT nasal spray Place 2 sprays into both nostrils at bedtime.    . lactobacillus acidophilus (BACID) TABS tablet Take 1 tablet by mouth daily.    Marland Kitchen levothyroxine (SYNTHROID, LEVOTHROID) 25 MCG tablet Take 1 1/2 tablets daily (Patient taking differently: Take 37.5 mcg by mouth daily before breakfast. Take 1 1/2 tablets daily) 130 tablet 2  . loratadine (CLARITIN) 10 MG tablet Take 10 mg by mouth daily as needed (for seasonal allergies (Spring/Summer)).     Marland Kitchen MAGNESIUM CITRATE PO Take 1,000 mg by mouth every evening.     . meloxicam (MOBIC) 15 MG tablet Take 15 mg by mouth daily.    . Multiple Vitamins-Minerals (MULTIVITAMIN WITH MINERALS) tablet Take 1 tablet by mouth every 3 (three) days.     . Omega-3 Fatty Acids (OMEGA 3 PO) Take 2,560 mg by mouth at bedtime.     . Potassium 99 MG TABS Take 99 mg by mouth every evening.    . traZODone (DESYREL) 100 MG tablet Take 33-50 mg by mouth at bedtime as needed for sleep.     . urea (CARMOL) 40 %  CREA Apply 1 application topically every other day.     . vitamin E 400 UNIT capsule  Take 400 Units by mouth every 3 (three) days.     Marland Kitchen EPINEPHrine (EPIPEN JR 2-PAK) 0.15 MG/0.3ML injection Inject 0.15 mg into the muscle as needed for anaphylaxis.     No current facility-administered medications for this visit.     Allergies as of 12/03/2018 - Review Complete 12/03/2018  Allergen Reaction Noted  . Fish allergy Anaphylaxis 06/23/2014  . Peanuts [peanut oil] Anaphylaxis 06/23/2014    Vitals: BP 117/67 (BP Location: Left Arm, Patient Position: Sitting)   Pulse 63   Temp 98.2 F (36.8 C)   Ht 5\' 4"  (1.626 m)   Wt 133 lb (60.3 kg)   LMP  (LMP Unknown)   BMI 22.83 kg/m  Last Weight:  Wt Readings from Last 1 Encounters:  12/03/18 133 lb (60.3 kg)   Last Height:   Ht Readings from Last 1 Encounters:  12/03/18 5\' 4"  (1.626 m)     Physical exam: Exam: Gen: NAD, conversant, well nourised, well groomed                     CV: RRR, no MRG. No Carotid Bruits. No peripheral edema, warm, nontender Eyes: Conjunctivae clear without exudates or hemorrhage  Neuro: Detailed Neurologic Exam  Speech:    Speech is normal; fluent and spontaneous with normal comprehension.  Cognition:    The patient is oriented to person, place, and time;     recent and remote memory intact;     language fluent;     normal attention, concentration,     fund of knowledge Cranial Nerves:    The pupils are equal, round, and reactive to light. The fundi are normal and spontaneous venous pulsations are present. Visual fields are full to finger confrontation. Extraocular movements are intact. Trigeminal sensation is intact and the muscles of mastication are normal. The face is symmetric. The palate elevates in the midline. Hearing intact. Voice is normal. Shoulder shrug is normal. The tongue has normal motion without fasciculations.   Coordination:    Normal finger to nose and heel to shin.   Gait:    Heel-toe and tandem gait are normal.   Motor Observation:    No asymmetry, no atrophy,  and no involuntary movements noted. Tone:    Normal muscle tone.    Posture:    Posture is normal. normal erect    Strength:    Hip flexion weakness right leg     Sensation: sensory changes to pin prick to femoral nerve distribution also more distally in L5/S1     Reflex Exam:  DTR's:    Absent Ajs, brisk patellar on the right. Otherwise deep tendon reflexes in the upper and lower extremities are normal bilaterally.   Toes:    The toes are downgoing bilaterally.   Clonus:    Clonus is absent.    Assessment/Plan:   68 y.o. female here as requested by Suella Broad MD for paresthesias.  She has a past medical history chronic pain syndrome, cervical postlaminectomy syndrome, lumbar postlaminectomy syndrome, chronic neck pain, spinal stenosis of lumbar region, chronic low back pain, osteoarthritis, impingement syndrome of the left shoulder region, pain in left and right hand, hip pain, sacroiliac joint pain.  She has had lumbar laminectomy in January 2019.  - I suspect patient's symptoms are residual from radiculopathy and spinal stenosis. Explained that sometimes the symptoms do not get better  with surgery but we prevent further damage by proceeding with intervention, often nerves can be permanently damaged prior to surgery. - Patient needs to discuss right leg feeling cold with Kelton Pillar for vascular studies as clinically warranted by Dr. Laurann Montana - emg/ncs to the bilateral lower extremities - CT abdomen and pelvis to evaluate for mass that can be compressing the femoral nerve as it travels through the pelvis due to tenderness in the pelvic area and pelvic discomfort - need prior mri lumbar spine CD images  Orders Placed This Encounter  Procedures  . CT ABDOMEN PELVIS W WO CONTRAST  . Basic Metabolic Panel  . NCV with EMG(electromyography)    Cc: Kelton Pillar, MD,    Sarina Ill, MD  [facility] [address] [place], [place]  [address]  Phone [phone] Fax [phone]

## 2018-12-03 NOTE — Telephone Encounter (Signed)
Courtney [name], can you get a CD with imaging of MRI Lumbar spine from [facility]? They referred patient but I don;t have these images. thanks

## 2018-12-04 LAB — BASIC METABOLIC PANEL
BUN/Creatinine Ratio: 30 — ABNORMAL HIGH (ref 12–28)
BUN: 22 mg/dL (ref 8–27)
CO2: 25 mmol/L (ref 20–29)
Calcium: 9.6 mg/dL (ref 8.7–10.3)
Chloride: 102 mmol/L (ref 96–106)
Creatinine, Ser: 0.74 mg/dL (ref 0.57–1.00)
GFR calc Af Amer: 97 mL/min/{1.73_m2} (ref >59)
GFR calc non Af Amer: 84 mL/min/{1.73_m2} (ref >59)
Glucose: 89 mg/dL (ref 65–99)
Potassium: 4.4 mmol/L (ref 3.5–5.2)
Sodium: 140 mmol/L (ref 134–144)

## 2018-12-10 NOTE — Telephone Encounter (Signed)
Noted, ready for Dr. Cathren Laine review.

## 2018-12-10 NOTE — Telephone Encounter (Signed)
R/c cd and report from [facility], cd and report on [place] desk.

## 2018-12-18 ENCOUNTER — Other Ambulatory Visit: Payer: Self-pay | Admitting: Family Medicine

## 2018-12-18 DIAGNOSIS — Z1231 Encounter for screening mammogram for malignant neoplasm of breast: Secondary | ICD-10-CM

## 2018-12-27 ENCOUNTER — Other Ambulatory Visit: Payer: Medicare Other

## 2018-12-30 DIAGNOSIS — M7062 Trochanteric bursitis, left hip: Secondary | ICD-10-CM | POA: Diagnosis not present

## 2019-01-01 ENCOUNTER — Ambulatory Visit
Admission: RE | Admit: 2019-01-01 | Discharge: 2019-01-01 | Disposition: A | Discharge status: Home / Self Care | Payer: Medicare Other | Source: Ambulatory Visit | Attending: Neurology | Admitting: Neurology

## 2019-01-01 DIAGNOSIS — N281 Cyst of kidney, acquired: Secondary | ICD-10-CM | POA: Diagnosis not present

## 2019-01-01 DIAGNOSIS — R29898 Other symptoms and signs involving the musculoskeletal system: Secondary | ICD-10-CM

## 2019-01-01 DIAGNOSIS — G5721 Lesion of femoral nerve, right lower limb: Secondary | ICD-10-CM

## 2019-01-01 DIAGNOSIS — R208 Other disturbances of skin sensation: Secondary | ICD-10-CM | POA: Diagnosis not present

## 2019-01-01 DIAGNOSIS — R200 Anesthesia of skin: Secondary | ICD-10-CM

## 2019-01-01 DIAGNOSIS — R102 Pelvic and perineal pain: Secondary | ICD-10-CM

## 2019-01-01 DIAGNOSIS — R1907 Generalized intra-abdominal and pelvic swelling, mass and lump: Secondary | ICD-10-CM

## 2019-01-01 MED ORDER — IOPAMIDOL (ISOVUE-300) INJECTION 61%
100.0000 mL | Freq: Once | INTRAVENOUS | Status: AC | PRN
  Administered 2019-01-01: 100 mL via INTRAVENOUS

## 2019-01-02 ENCOUNTER — Ambulatory Visit (INDEPENDENT_AMBULATORY_CARE_PROVIDER_SITE_OTHER): Payer: Medicare Other | Admitting: Neurology

## 2019-01-02 ENCOUNTER — Other Ambulatory Visit: Payer: Self-pay

## 2019-01-02 DIAGNOSIS — Z0289 Encounter for other administrative examinations: Secondary | ICD-10-CM

## 2019-01-02 DIAGNOSIS — R29898 Other symptoms and signs involving the musculoskeletal system: Secondary | ICD-10-CM

## 2019-01-02 DIAGNOSIS — M5416 Radiculopathy, lumbar region: Secondary | ICD-10-CM

## 2019-01-02 DIAGNOSIS — R208 Other disturbances of skin sensation: Secondary | ICD-10-CM

## 2019-01-02 NOTE — Progress Notes (Signed)
History: 68 year old female here from Dr. Nelva Bush for evaluation of continued right leg symptoms. She had L4-L5 decompression, L5-S1 left laminotomy/foraminotomy in 06/2017. She had chronic low back pain, spinal stenosis, spondylolisthesis, lumbosacral degenerative disk disease. Her symptoms have been ongoing for 3 years. I have a long talk with patient. I see chronic changes but nothing acute/ongoing. Changes seen on emg are  from prior lumbar radiculopathy and lumbar stenosis. After this long the changes are likely chronic and she does report her symptoms have been stable for 2 years not worsening or improving. The first year she improved as far as motor and sensory, she couldn;t even initially lift the leg and now she can drive and walk but still has sensory changes and subjective weakness. No acute/ongoing findings and I recommend medical and conservative management.  A total of 25 minutes was spent face-to-face with this patient. Over half this time was spent on counseling patient on the  1. Lumbar radiculopathy   2. Right leg weakness    diagnosis and different diagnostic and therapeutic options, counseling and coordination of care, risks ans benefits of management, compliance, or risk factor reduction and education.  This does not include time spent on emg/ncs procedure.

## 2019-01-05 NOTE — Progress Notes (Signed)
Full Name: Courtney [name] Gender: Female MRN #: [id number] Date of Birth: 1950/09/16    Visit Date: 01/02/2019 09:57 Age: 68 Years 39 Months Old Examining Physician: Sarina Ill, MD  Referring Physician: Suella Broad, MD  History: 68 y.o. female here as requested by Suella Broad MD for paresthesias.  She has a past medical history chronic pain syndrome, cervical postlaminectomy syndrome, lumbar postlaminectomy syndrome, spinal stenosis of lumbar region, chronic low back pain, osteoartritis, hip pain, sacroiliac joint pain.  She has had lumbar laminectomy in January 2019. Sheis here for 3 years of stable right leg sensory changes and weakness likely residual from above  Summary: EMG/NCS performed on the lower extremities. All nerves (as indicated in the following tables) were within normal limits.  The right medial gastrocnemius and right tibialis anterior showed diminished motor unit recruitment.  The right medial gastrocnemius also showed increased motor unit amplitude. All remaining muscles (as indicated in the following tables) were within normal limits.       Conclusion: Nerve conductions studies normal. The needle emg examination showed chronic neurogenic changes in distal right L5/S1 muscles. No denervation to suggest acute/ongoing right lower extremity radiculopathy.  Sarina Ill M.D.  [facility] [place], [place] [address] Tel: [phone] Fax: [phone]        [facility]    Nerve / Sites Muscle Latency Ref. Amplitude Ref. Rel Amp Segments Distance Velocity Ref. Area    ms ms mV mV %  cm m/s m/s mVms  R Peroneal - EDB     Ankle EDB 5.2 ?6.5 3.2 ?2.0 100 Ankle - EDB 9   13.1     Fib head EDB 10.7  2.8  89.7 Fib head - Ankle 25 45 ?44 13.3     Pop fossa EDB 12.9  2.7  95.8 Pop fossa - Fib head 10 45 ?44 13.1         Pop fossa - Ankle      L Peroneal - EDB     Ankle EDB 4.9 ?6.5 4.7 ?2.0 100 Ankle - EDB 9   13.8     Fib head EDB 10.5   4.3  92.1 Fib head - Ankle 25 44 ?44 13.4     Pop fossa EDB 12.8  4.0  92.8 Pop fossa - Fib head 10 44 ?44 13.1         Pop fossa - Ankle      R Tibial - AH     Ankle AH 4.5 ?5.8 9.7 ?4.0 100 Ankle - AH 9   19.7     Pop fossa AH 12.3  7.0  72 Pop fossa - Ankle 33 42 ?41 17.5  L Tibial - AH     Ankle AH 4.6 ?5.8 9.8 ?4.0 100 Ankle - AH 9   17.7     Pop fossa AH 12.4  6.0  61.2 Pop fossa - Ankle 32 41 ?41 16.3             SNC    Nerve / Sites Rec. Site Peak Lat Ref.  Amp Ref. Segments Distance    ms ms V V  cm  R Sural - Ankle (Calf)     Calf Ankle 3.4 ?4.4 17 ?6 Calf - Ankle 14  L Sural - Ankle (Calf)     Calf Ankle 3.5 ?4.4 13 ?6 Calf - Ankle 14  R Superficial peroneal - Ankle     Lat leg Ankle  3.7 ?4.4 6 ?6 Lat leg - Ankle 14  L Superficial peroneal - Ankle     Lat leg Ankle 4.2 ?4.4 6 ?6 Lat leg - Ankle 14              F  Wave    Nerve F Lat Ref.   ms ms  R Tibial - AH 52.7 ?56.0  L Tibial - AH 52.6 ?56.0         EMG full       EMG Summary Table    Spontaneous MUAP Recruitment  Muscle IA Fib PSW Fasc Other Amp Dur. Poly Pattern  R. Gastrocnemius (Medial head) Normal None None None _______ Increased Normal Normal Reduced  R. Vastus medialis Normal None None None _______ Normal Normal Normal Normal  R. Iliopsoas Normal None None None _______ Normal Normal Normal Normal  R. Tibialis anterior Normal None None None _______ Normal Normal Normal Reduced  R. Biceps femoris (long head) Normal None None None _______ Normal Normal Normal Normal  R. Gluteus maximus Normal None None None _______ Normal Normal Normal Normal  R. Gluteus medius Normal None None None _______ Normal Normal Normal Normal

## 2019-01-05 NOTE — Progress Notes (Signed)
See procedure note.

## 2019-01-05 NOTE — Procedures (Signed)
Full Name: Courtney [name] Gender: Female MRN #: [id number] Date of Birth: 07/22/1950    Visit Date: 01/02/2019 09:57 Age: 68 Years 25 Months Old Examining Physician: Sarina Ill, MD  Referring Physician: Suella Broad, MD  History: 68 y.o. female here as requested by Suella Broad MD for paresthesias.  She has a past medical history chronic pain syndrome, cervical postlaminectomy syndrome, lumbar postlaminectomy syndrome, spinal stenosis of lumbar region, chronic low back pain, osteoartritis, hip pain, sacroiliac joint pain.  She has had lumbar laminectomy in January 2019. Sheis here for 3 years of stable right leg sensory changes and weakness likely residual from above  Summary: EMG/NCS performed on the lower extremities. All nerves (as indicated in the following tables) were within normal limits.  The right medial gastrocnemius and right tibialis anterior showed diminished motor unit recruitment.  The right medial gastrocnemius also showed increased motor unit amplitude. All remaining muscles (as indicated in the following tables) were within normal limits.       Conclusion: Nerve conductions studies normal. The needle emg examination showed chronic neurogenic changes in distal right L5/S1 muscles. No denervation to suggest acute/ongoing right lower extremity radiculopathy.  Sarina Ill M.D.  [facility] [place], [place] [address] Tel: [phone] Fax: [phone]        [facility]    Nerve / Sites Muscle Latency Ref. Amplitude Ref. Rel Amp Segments Distance Velocity Ref. Area    ms ms mV mV %  cm m/s m/s mVms  R Peroneal - EDB     Ankle EDB 5.2 ?6.5 3.2 ?2.0 100 Ankle - EDB 9   13.1     Fib head EDB 10.7  2.8  89.7 Fib head - Ankle 25 45 ?44 13.3     Pop fossa EDB 12.9  2.7  95.8 Pop fossa - Fib head 10 45 ?44 13.1         Pop fossa - Ankle      L Peroneal - EDB     Ankle EDB 4.9 ?6.5 4.7 ?2.0 100 Ankle - EDB 9   13.8     Fib head EDB 10.5   4.3  92.1 Fib head - Ankle 25 44 ?44 13.4     Pop fossa EDB 12.8  4.0  92.8 Pop fossa - Fib head 10 44 ?44 13.1         Pop fossa - Ankle      R Tibial - AH     Ankle AH 4.5 ?5.8 9.7 ?4.0 100 Ankle - AH 9   19.7     Pop fossa AH 12.3  7.0  72 Pop fossa - Ankle 33 42 ?41 17.5  L Tibial - AH     Ankle AH 4.6 ?5.8 9.8 ?4.0 100 Ankle - AH 9   17.7     Pop fossa AH 12.4  6.0  61.2 Pop fossa - Ankle 32 41 ?41 16.3             SNC    Nerve / Sites Rec. Site Peak Lat Ref.  Amp Ref. Segments Distance    ms ms V V  cm  R Sural - Ankle (Calf)     Calf Ankle 3.4 ?4.4 17 ?6 Calf - Ankle 14  L Sural - Ankle (Calf)     Calf Ankle 3.5 ?4.4 13 ?6 Calf - Ankle 14  R Superficial peroneal - Ankle     Lat leg Ankle  3.7 ?4.4 6 ?6 Lat leg - Ankle 14  L Superficial peroneal - Ankle     Lat leg Ankle 4.2 ?4.4 6 ?6 Lat leg - Ankle 14              F  Wave    Nerve F Lat Ref.   ms ms  R Tibial - AH 52.7 ?56.0  L Tibial - AH 52.6 ?56.0         EMG full       EMG Summary Table    Spontaneous MUAP Recruitment  Muscle IA Fib PSW Fasc Other Amp Dur. Poly Pattern  R. Gastrocnemius (Medial head) Normal None None None _______ Increased Normal Normal Reduced  R. Vastus medialis Normal None None None _______ Normal Normal Normal Normal  R. Iliopsoas Normal None None None _______ Normal Normal Normal Normal  R. Tibialis anterior Normal None None None _______ Normal Normal Normal Reduced  R. Biceps femoris (long head) Normal None None None _______ Normal Normal Normal Normal  R. Gluteus maximus Normal None None None _______ Normal Normal Normal Normal  R. Gluteus medius Normal None None None _______ Normal Normal Normal Normal

## 2019-01-13 DIAGNOSIS — E039 Hypothyroidism, unspecified: Secondary | ICD-10-CM | POA: Diagnosis not present

## 2019-01-13 DIAGNOSIS — M8580 Other specified disorders of bone density and structure, unspecified site: Secondary | ICD-10-CM | POA: Diagnosis not present

## 2019-01-13 DIAGNOSIS — R7303 Prediabetes: Secondary | ICD-10-CM | POA: Diagnosis not present

## 2019-01-13 DIAGNOSIS — G629 Polyneuropathy, unspecified: Secondary | ICD-10-CM | POA: Diagnosis not present

## 2019-01-13 DIAGNOSIS — F324 Major depressive disorder, single episode, in partial remission: Secondary | ICD-10-CM | POA: Diagnosis not present

## 2019-01-13 DIAGNOSIS — E7800 Pure hypercholesterolemia, unspecified: Secondary | ICD-10-CM | POA: Diagnosis not present

## 2019-01-13 DIAGNOSIS — N950 Postmenopausal bleeding: Secondary | ICD-10-CM | POA: Diagnosis not present

## 2019-01-14 ENCOUNTER — Other Ambulatory Visit: Payer: Self-pay | Admitting: Family Medicine

## 2019-01-14 DIAGNOSIS — G603 Idiopathic progressive neuropathy: Secondary | ICD-10-CM

## 2019-01-14 DIAGNOSIS — M8580 Other specified disorders of bone density and structure, unspecified site: Secondary | ICD-10-CM

## 2019-01-15 DIAGNOSIS — E039 Hypothyroidism, unspecified: Secondary | ICD-10-CM | POA: Diagnosis not present

## 2019-01-15 DIAGNOSIS — R7303 Prediabetes: Secondary | ICD-10-CM | POA: Diagnosis not present

## 2019-01-15 DIAGNOSIS — E7800 Pure hypercholesterolemia, unspecified: Secondary | ICD-10-CM | POA: Diagnosis not present

## 2019-01-16 DIAGNOSIS — M25551 Pain in right hip: Secondary | ICD-10-CM | POA: Diagnosis not present

## 2019-01-16 DIAGNOSIS — M25552 Pain in left hip: Secondary | ICD-10-CM | POA: Diagnosis not present

## 2019-01-21 DIAGNOSIS — M25552 Pain in left hip: Secondary | ICD-10-CM | POA: Diagnosis not present

## 2019-01-21 DIAGNOSIS — M25551 Pain in right hip: Secondary | ICD-10-CM | POA: Diagnosis not present

## 2019-01-22 ENCOUNTER — Ambulatory Visit
Admission: RE | Admit: 2019-01-22 | Discharge: 2019-01-22 | Disposition: A | Discharge status: Home / Self Care | Payer: Medicare Other | Source: Ambulatory Visit | Attending: Family Medicine | Admitting: Family Medicine

## 2019-01-22 DIAGNOSIS — I70211 Atherosclerosis of native arteries of extremities with intermittent claudication, right leg: Secondary | ICD-10-CM | POA: Diagnosis not present

## 2019-01-22 DIAGNOSIS — G603 Idiopathic progressive neuropathy: Secondary | ICD-10-CM

## 2019-01-23 DIAGNOSIS — M25551 Pain in right hip: Secondary | ICD-10-CM | POA: Diagnosis not present

## 2019-01-23 DIAGNOSIS — M25552 Pain in left hip: Secondary | ICD-10-CM | POA: Diagnosis not present

## 2019-01-28 DIAGNOSIS — M25551 Pain in right hip: Secondary | ICD-10-CM | POA: Diagnosis not present

## 2019-01-28 DIAGNOSIS — M25552 Pain in left hip: Secondary | ICD-10-CM | POA: Diagnosis not present

## 2019-01-30 DIAGNOSIS — M25551 Pain in right hip: Secondary | ICD-10-CM | POA: Diagnosis not present

## 2019-01-30 DIAGNOSIS — M25552 Pain in left hip: Secondary | ICD-10-CM | POA: Diagnosis not present

## 2019-01-31 ENCOUNTER — Ambulatory Visit
Admission: RE | Admit: 2019-01-31 | Discharge: 2019-01-31 | Disposition: A | Discharge status: Home / Self Care | Payer: Medicare Other | Source: Ambulatory Visit | Attending: Family Medicine | Admitting: Family Medicine

## 2019-01-31 ENCOUNTER — Other Ambulatory Visit: Payer: Self-pay

## 2019-01-31 DIAGNOSIS — Z1231 Encounter for screening mammogram for malignant neoplasm of breast: Secondary | ICD-10-CM | POA: Diagnosis not present

## 2019-02-18 DIAGNOSIS — M25551 Pain in right hip: Secondary | ICD-10-CM | POA: Diagnosis not present

## 2019-02-18 DIAGNOSIS — M25552 Pain in left hip: Secondary | ICD-10-CM | POA: Diagnosis not present

## 2019-02-25 DIAGNOSIS — G894 Chronic pain syndrome: Secondary | ICD-10-CM | POA: Diagnosis not present

## 2019-02-27 ENCOUNTER — Encounter: Payer: Medicare Other | Admitting: Neurology

## 2019-03-25 ENCOUNTER — Other Ambulatory Visit: Payer: Medicare Other

## 2019-05-30 ENCOUNTER — Other Ambulatory Visit: Payer: Medicare Other

## 2019-07-02 DIAGNOSIS — Z23 Encounter for immunization: Secondary | ICD-10-CM | POA: Diagnosis not present

## 2019-07-14 DIAGNOSIS — G894 Chronic pain syndrome: Secondary | ICD-10-CM | POA: Diagnosis not present

## 2019-07-30 DIAGNOSIS — Z23 Encounter for immunization: Secondary | ICD-10-CM | POA: Diagnosis not present

## 2019-08-28 DIAGNOSIS — E039 Hypothyroidism, unspecified: Secondary | ICD-10-CM | POA: Diagnosis not present

## 2019-08-28 DIAGNOSIS — G629 Polyneuropathy, unspecified: Secondary | ICD-10-CM | POA: Diagnosis not present

## 2019-08-28 DIAGNOSIS — J309 Allergic rhinitis, unspecified: Secondary | ICD-10-CM | POA: Diagnosis not present

## 2019-08-28 DIAGNOSIS — R7303 Prediabetes: Secondary | ICD-10-CM | POA: Diagnosis not present

## 2019-08-28 DIAGNOSIS — E7800 Pure hypercholesterolemia, unspecified: Secondary | ICD-10-CM | POA: Diagnosis not present

## 2019-08-28 DIAGNOSIS — F324 Major depressive disorder, single episode, in partial remission: Secondary | ICD-10-CM | POA: Diagnosis not present

## 2019-08-28 DIAGNOSIS — R011 Cardiac murmur, unspecified: Secondary | ICD-10-CM | POA: Diagnosis not present

## 2019-08-28 DIAGNOSIS — K219 Gastro-esophageal reflux disease without esophagitis: Secondary | ICD-10-CM | POA: Diagnosis not present

## 2019-08-28 DIAGNOSIS — Z0000 Encounter for general adult medical examination without abnormal findings: Secondary | ICD-10-CM | POA: Diagnosis not present

## 2019-08-28 DIAGNOSIS — M4800 Spinal stenosis, site unspecified: Secondary | ICD-10-CM | POA: Diagnosis not present

## 2019-08-28 DIAGNOSIS — Z1389 Encounter for screening for other disorder: Secondary | ICD-10-CM | POA: Diagnosis not present

## 2019-08-28 DIAGNOSIS — Z23 Encounter for immunization: Secondary | ICD-10-CM | POA: Diagnosis not present

## 2019-09-08 DIAGNOSIS — M545 Low back pain: Secondary | ICD-10-CM | POA: Diagnosis not present

## 2019-09-08 DIAGNOSIS — M5136 Other intervertebral disc degeneration, lumbar region: Secondary | ICD-10-CM | POA: Diagnosis not present

## 2019-09-08 DIAGNOSIS — M961 Postlaminectomy syndrome, not elsewhere classified: Secondary | ICD-10-CM | POA: Diagnosis not present

## 2019-09-08 DIAGNOSIS — M4310 Spondylolisthesis, site unspecified: Secondary | ICD-10-CM | POA: Diagnosis not present

## 2019-09-08 DIAGNOSIS — Z9889 Other specified postprocedural states: Secondary | ICD-10-CM | POA: Diagnosis not present

## 2019-09-08 DIAGNOSIS — M5137 Other intervertebral disc degeneration, lumbosacral region: Secondary | ICD-10-CM | POA: Diagnosis not present

## 2019-09-09 DIAGNOSIS — M25552 Pain in left hip: Secondary | ICD-10-CM | POA: Diagnosis not present

## 2019-09-11 DIAGNOSIS — M5136 Other intervertebral disc degeneration, lumbar region: Secondary | ICD-10-CM | POA: Diagnosis not present

## 2019-09-16 DIAGNOSIS — M25552 Pain in left hip: Secondary | ICD-10-CM | POA: Diagnosis not present

## 2019-09-23 DIAGNOSIS — M25552 Pain in left hip: Secondary | ICD-10-CM | POA: Diagnosis not present

## 2019-09-25 ENCOUNTER — Other Ambulatory Visit: Payer: Self-pay

## 2019-09-25 ENCOUNTER — Encounter: Payer: Self-pay | Admitting: Cardiovascular Disease

## 2019-09-25 ENCOUNTER — Ambulatory Visit (INDEPENDENT_AMBULATORY_CARE_PROVIDER_SITE_OTHER): Payer: Medicare Other | Admitting: Cardiovascular Disease

## 2019-09-25 VITALS — BP 114/66 | HR 70 | Ht 64.0 in | Wt 137.2 lb

## 2019-09-25 DIAGNOSIS — Q2381 Bicuspid aortic valve: Secondary | ICD-10-CM | POA: Insufficient documentation

## 2019-09-25 DIAGNOSIS — R1907 Generalized intra-abdominal and pelvic swelling, mass and lump: Secondary | ICD-10-CM

## 2019-09-25 DIAGNOSIS — Q231 Congenital insufficiency of aortic valve: Secondary | ICD-10-CM

## 2019-09-25 NOTE — Progress Notes (Signed)
Cardiology Office Note:    Date:  09/25/2019   ID:  Courtney [name], DOB 06-Jul-1950, MRN [id number]  PCP:  Kelton Pillar, MD  Cardiologist:   Electrophysiologist:  None   Referring MD: Kelton Pillar, MD   Chief Complaint  Patient presents with  . Hyperlipidemia    History of Present Illness:    Courtney [name] is a 69 y.o. female with a hx of hyperlipidemia.  We were asked to see her by Dr.  Laurann Montana for further evaluation of her hyperlipidemia  And a heart murmur  Echo August, 2016  showed normal LV function  a bicuspid AV   Has had a heart murmur for years.  Discovered at age 40  Needs to have spine surgery - fairly extensive 2 day surgery  - she wanted to make sure she was ok for the surgery .    Has orthostasis when gardening  -  Occasional palps   No Cp, no dyspnea  Tries to walk but is limited by her back  Can do recumbant bike    Past Medical History:  Diagnosis Date  . ADHD    ADD  . Allergic to pets    pet dander  . Allergies   . Anemia   . Arthritis   . Asthma   . Bicuspid aortic valve    bicuspid AV with no stenosis or regurgitation 01/2015 echo   . Complication of anesthesia    slow to wake up and lingers for a long time  . Concussion   . Depression   . Family history of adverse reaction to anesthesia    mom had skin reaction to anesthesia but cant remember what it was or exactly what happened  . Fatigue   . Fibromyalgia   . GERD (gastroesophageal reflux disease)   . Hyperlipidemia   . Hypothyroidism   . Irregular periods   . Joint pain   . Joint stiffness   . Menopause   . Migraines   . Numbness and tingling   . Pollen allergy   . Ringing in ears   . Sleep apnea    does not wear CPAP  . Snoring     Past Surgical History:  Procedure Laterality Date  . ABDOMINOPLASTY    . ANTERIOR CERVICAL DECOMP/DISCECTOMY FUSION N/A 06/28/2016   Procedure: ACDF C5-7 ANTERIOR CERVICAL DECOMPRESSION/DISCECTOMY FUSION 2 LEVELS;  Surgeon: Melina Schools, MD;  Location: [facility];  Service: Orthopedics;  Laterality: N/A;  Requests 3 hrs  . COLONOSCOPY    . DENTAL SURGERY    . HERNIA REPAIR     times 2  . LUMBAR LAMINECTOMY/DECOMPRESSION MICRODISCECTOMY  04/21/2015   Procedure: DECOMPRESSION LUMBAR THREE-LUMBAR FOUR;  Surgeon: Melina Schools, MD;  Location: [facility];  Service: Orthopedics;;  . LUMBAR LAMINECTOMY/DECOMPRESSION MICRODISCECTOMY N/A 06/20/2017   Procedure: L4-5 decompression, L5-S1 left laminotomy/foraminotomy;  Surgeon: Melina Schools, MD;  Location: [facility];  Service: Orthopedics;  Laterality: N/A;  3 hrs  . PARATHYROIDECTOMY  2012  . SACROILIAC JOINT FUSION Right 07/24/2018   Procedure: SACROILIAC JOINT FUSION;  Surgeon: Melina Schools, MD;  Location: [facility];  Service: Orthopedics;  Laterality: Right;  90 mins  . SHOULDER ARTHROSCOPY W/ ACROMIAL REPAIR Right   . TONSILLECTOMY      Current Medications: Current Meds  Medication Sig  . amoxicillin (AMOXIL) 500 MG capsule Take 4 capsules by mouth as needed. For dental appt  . Ascorbic Acid (VITAMIN C) 1000 MG tablet Take 1,000 mg by mouth daily.   Marland Kitchen  atorvastatin (LIPITOR) 10 MG tablet Take 10 mg by mouth every other day.   . Cholecalciferol (VITAMIN D3) 2000 units TABS Take 2,000 Units by mouth every evening.  . clobetasol ointment (TEMOVATE) [id number] % Apply 1 application topically every 30 (thirty) days.  . Coenzyme Q10 300 MG CAPS Take 300 mg by mouth every other day.   . conjugated estrogens (PREMARIN) vaginal cream Place 1 Applicatorful vaginally. As needed  . diclofenac sodium (VOLTAREN) 1 % GEL Apply topically as needed.  . DULoxetine (CYMBALTA) 30 MG capsule Take 30 mg by mouth daily after breakfast.   . EPINEPHrine (EPIPEN JR 2-PAK) 0.15 MG/0.3ML injection Inject 0.15 mg into the muscle as needed for anaphylaxis.  Marland Kitchen estradiol (ESTRACE) 0.1 MG/GM vaginal cream Place 0.1 g vaginally as needed. 2x a week  . FERROUS BISGLYCINATE CHELATE PO Take 1 tablet by mouth every 3 (three)  days.   . fluticasone (FLONASE) 50 MCG/ACT nasal spray Place 2 sprays into both nostrils at bedtime.  . lactobacillus acidophilus (BACID) TABS tablet Take 1 tablet by mouth daily.  Marland Kitchen levothyroxine (SYNTHROID, LEVOTHROID) 25 MCG tablet Take 1 1/2 tablets daily  . Lidocaine 4 % PTCH Apply 1 patch topically daily as needed.  . loratadine (CLARITIN) 10 MG tablet Take 10 mg by mouth daily as needed (for seasonal allergies (Spring/Summer)).   Marland Kitchen MAGNESIUM CITRATE PO Take 1,000 mg by mouth every evening.   . meloxicam (MOBIC) 15 MG tablet Take 15 mg by mouth daily.  . Multiple Vitamins-Minerals (MULTIVITAMIN WITH MINERALS) tablet Take 1 tablet by mouth every 3 (three) days.   . Omega-3 Fatty Acids (OMEGA 3 PO) Take 2,560 mg by mouth at bedtime.   . Oxycodone HCl 10 MG TABS Take 7.5 mg by mouth 3 (three) times daily.  . Potassium 99 MG TABS Take 99 mg by mouth every evening.  . traZODone (DESYREL) 100 MG tablet Take 33-50 mg by mouth at bedtime as needed for sleep.   . urea (CARMOL) 40 % CREA Apply 1 application topically every other day.   . vitamin E 400 UNIT capsule Take 400 Units by mouth every 3 (three) days.      Allergies:   Fish allergy and Peanuts [peanut oil]   Social History   Socioeconomic History  . Marital status: Widowed    Spouse name: Not on file  . Number of children: 2  . Years of education: Master's   . Highest education level: Not on file  Occupational History  . Occupation: Retired   Tobacco Use  . Smoking status: Former Research scientist (life sciences)  . Smokeless tobacco: Never Used  . Tobacco comment: social smoker stop at age 23  Substance and Sexual Activity  . Alcohol use: Yes    Alcohol/week: 1.0 standard drinks    Types: 1 Glasses of wine per week    Comment: socially  . Drug use: No  . Sexual activity: Never  Other Topics Concern  . Not on file  Social History Narrative   Lives alone   Caffeine use: 1 cup coffee/day   Social Determinants of Health   Financial Resource  Strain:   . Difficulty of Paying Living Expenses:   Food Insecurity:   . Worried About Charity fundraiser in the Last Year:   . Arboriculturist in the Last Year:   Transportation Needs:   . Film/video editor (Medical):   Marland Kitchen Lack of Transportation (Non-Medical):   Physical Activity:   . Days of Exercise per  Week:   . Minutes of Exercise per Session:   Stress:   . Feeling of Stress :   Social Connections:   . Frequency of Communication with Friends and Family:   . Frequency of Social Gatherings with Friends and Family:   . Attends Religious Services:   . Active Member of Clubs or Organizations:   . Attends Archivist Meetings:   Marland Kitchen Marital Status:      Family History: The patient's family history includes COPD in her mother; Cancer in her mother; Heart disease in her mother; Hyperlipidemia in her mother; Hypertension in her mother; Thyroid disease in her daughter, mother, and sister.  Maternal grandfather had MI.  Strokes    ROS:   Please see the history of present illness.     All other systems reviewed and are negative.  EKGs/Labs/Other Studies Reviewed:    The following studies were reviewed today:   EKG:    September 25 2019   NSR at 64.   NS / T changes.   Recent Labs: 12/03/2018: BUN 22; Creatinine, Ser 0.74; Potassium 4.4; Sodium 140  Recent Lipid Panel No results found for: CHOL, TRIG, HDL, CHOLHDL, VLDL, LDLCALC, LDLDIRECT  Physical Exam:    VS:  BP 114/66   Pulse 70   Ht 5\' 4"  (1.626 m)   Wt 137 lb 4 oz (62.3 kg)   LMP  (LMP Unknown)   SpO2 97%   BMI 23.56 kg/m     Wt Readings from Last 3 Encounters:  09/25/19 137 lb 4 oz (62.3 kg)  12/03/18 133 lb (60.3 kg)  07/24/18 141 lb (64 kg)     GEN:  Well nourished, well developed in no acute distress HEENT: Normal NECK: No JVD; No carotid bruits LYMPHATICS: No lymphadenopathy CARDIAC: Regular rate, S1-S2.  She has a very soft murmur.  There is no diastolic murmur. RESPIRATORY:  Clear to  auscultation without rales, wheezing or rhonchi  ABDOMEN: Soft, non-tender, non-distended MUSCULOSKELETAL:  No edema; No deformity  SKIN: Warm and dry NEUROLOGIC:  Alert and oriented x 3 PSYCHIATRIC:  Normal affect   ASSESSMENT:    1. Bicuspid aortic valve   2. Generalized intra-abdominal and pelvic swelling, mass and lump    PLAN:    In order of problems listed above:  1. Bicuspid aortic valve.  I reviewed her echocardiogram from 2016.  She does indeed have a bicuspid aortic valve but her exam is nearly normal.  She has a very soft systolic murmur.  I did not hear any diastolic murmur.  I would like to repeat her echocardiogram for further assessment of her LV function and her valve.  Because bicuspid aortic valves are associated with aortic aneurysms, we will also do a CT angiogram of her entire aorta.  Her ascending aorta was normal size during her last echocardiogram.  I would recommend that she see me every 2 years or so.  Surprised that her aortic valve sounds are good for someone who may is age 45 but this is great news for her.  We will continue to follow her regularly.  She needs to have a fairly extensive back surgery.  At this point I do not see any high risk features from her exam or her history.  Assuming that the echocardiogram does not show any significant changes, she should be at low risk for her upcoming back surgery.   Medication Adjustments/Labs and Tests Ordered: Current medicines are reviewed at length with the patient today.  Concerns regarding medicines are outlined above.  Orders Placed This Encounter  Procedures  . CT ANGIO CHEST AORTA W/CM & OR WO/CM  . CT ANGIO ABDOMEN W &/OR WO CONTRAST  . EKG 12-Lead  . ECHOCARDIOGRAM COMPLETE   No orders of the defined types were placed in this encounter.   Patient Instructions  Medication Instructions:  Your physician recommends that you continue on your current medications as directed. Please refer to the  Current Medication list given to you today.  *If you need a refill on your cardiac medications before your next appointment, please call your pharmacy*   Lab Work: None Ordered If you have labs (blood work) drawn today and your tests are completely normal, you will receive your results only by: Marland Kitchen MyChart Message (if you have MyChart) OR . A paper copy in the mail If you have any lab test that is abnormal or we need to change your treatment, we will call you to review the results.   Testing/Procedures: Your physician has requested that you have an echocardiogram. Echocardiography is a painless test that uses sound waves to create images of your heart. It provides your doctor with information about the size and shape of your heart and how well your heart's chambers and valves are working. This procedure takes approximately one hour. There are no restrictions for this procedure.  Non-Cardiac CT Angiography (CTA), is a special type of CT scan that uses a computer to produce multi-dimensional views of major blood vessels throughout the body. In CT angiography, a contrast material is injected through an IV to help visualize the blood vessels (**SEE ORDERS - need 2 CTs at same time)   Follow-Up: At [facility], you and your health needs are our priority.  As part of our continuing mission to provide you with exceptional heart care, we have created designated Provider Care Teams.  These Care Teams include your primary Cardiologist (physician) and Advanced Practice Providers (APPs -  Physician Assistants and Nurse Practitioners) who all work together to provide you with the care you need, when you need it.   Your next appointment:   2 year(s)  The format for your next appointment:   In Person  Provider:   You may see [name], MD or one of the following Advanced Practice Providers on your designated Care Team:    Richardson Dopp, PA-C  [place], [place]  Daune Perch, [place]         Signed, [name], MD  09/25/2019 5:57 PM    [place]

## 2019-09-25 NOTE — Patient Instructions (Addendum)
Medication Instructions:  Your physician recommends that you continue on your current medications as directed. Please refer to the Current Medication list given to you today.  *If you need a refill on your cardiac medications before your next appointment, please call your pharmacy*   Lab Work: None Ordered If you have labs (blood work) drawn today and your tests are completely normal, you will receive your results only by: Marland Kitchen MyChart Message (if you have MyChart) OR . A paper copy in the mail If you have any lab test that is abnormal or we need to change your treatment, we will call you to review the results.   Testing/Procedures: Your physician has requested that you have an echocardiogram. Echocardiography is a painless test that uses sound waves to create images of your heart. It provides your doctor with information about the size and shape of your heart and how well your heart's chambers and valves are working. This procedure takes approximately one hour. There are no restrictions for this procedure.  Non-Cardiac CT Angiography (CTA), is a special type of CT scan that uses a computer to produce multi-dimensional views of major blood vessels throughout the body. In CT angiography, a contrast material is injected through an IV to help visualize the blood vessels (**SEE ORDERS - need 2 CTs at same time)   Follow-Up: At [facility], you and your health needs are our priority.  As part of our continuing mission to provide you with exceptional heart care, we have created designated Provider Care Teams.  These Care Teams include your primary Cardiologist (physician) and Advanced Practice Providers (APPs -  Physician Assistants and Nurse Practitioners) who all work together to provide you with the care you need, when you need it.   Your next appointment:   2 year(s)  The format for your next appointment:   In Person  Provider:   You may see [name], MD or one of the following  Advanced Practice Providers on your designated Care Team:    Richardson Dopp, PA-C  [place], [place]  Daune Perch, [place]

## 2019-09-30 DIAGNOSIS — M25552 Pain in left hip: Secondary | ICD-10-CM | POA: Diagnosis not present

## 2019-10-14 ENCOUNTER — Ambulatory Visit (HOSPITAL_BASED_OUTPATIENT_CLINIC_OR_DEPARTMENT_OTHER): Payer: Medicare Other

## 2019-10-14 ENCOUNTER — Ambulatory Visit (HOSPITAL_COMMUNITY)
Admission: RE | Admit: 2019-10-14 | Discharge: 2019-10-14 | Disposition: A | Discharge status: Home / Self Care | Payer: Medicare Other | Source: Ambulatory Visit | Attending: Cardiovascular Disease | Admitting: Cardiovascular Disease

## 2019-10-14 ENCOUNTER — Other Ambulatory Visit: Payer: Self-pay

## 2019-10-14 ENCOUNTER — Telehealth: Payer: Self-pay

## 2019-10-14 ENCOUNTER — Telehealth: Payer: Self-pay | Admitting: Nurse Practitioner

## 2019-10-14 DIAGNOSIS — Q231 Congenital insufficiency of aortic valve: Secondary | ICD-10-CM

## 2019-10-14 DIAGNOSIS — I700 Atherosclerosis of aorta: Secondary | ICD-10-CM | POA: Diagnosis not present

## 2019-10-14 DIAGNOSIS — R1907 Generalized intra-abdominal and pelvic swelling, mass and lump: Secondary | ICD-10-CM | POA: Diagnosis not present

## 2019-10-14 MED ORDER — IOHEXOL 350 MG/ML SOLN
100.0000 mL | Freq: Once | INTRAVENOUS | Status: AC | PRN
  Administered 2019-10-14: 100 mL via INTRAVENOUS

## 2019-10-14 NOTE — Telephone Encounter (Signed)
Reviewed results of echocardiogram with patient and questions were answered about diastolic dysfunction to her satisfaction. She will call prior to her scheduled 2 year follow-up if she has any concerns or worsening symptoms. She thanked me for the call.

## 2019-10-14 NOTE — Telephone Encounter (Signed)
Spoke with Janelle in [place] asking for most recent labs as pt is on table for CT scan.  Janelle advised last labs in Epic show as 11/2018.

## 2019-10-14 NOTE — Telephone Encounter (Signed)
-----   Message from Thayer Headings, MD sent at 10/14/2019  5:32 PM EDT ----- Normal LV function. Grade 2 diastolic dysfunction Bicuspid AV

## 2019-10-20 DIAGNOSIS — M545 Low back pain: Secondary | ICD-10-CM | POA: Diagnosis not present

## 2019-10-24 DIAGNOSIS — M545 Low back pain: Secondary | ICD-10-CM | POA: Diagnosis not present

## 2019-10-27 DIAGNOSIS — Z5181 Encounter for therapeutic drug level monitoring: Secondary | ICD-10-CM | POA: Diagnosis not present

## 2019-10-27 DIAGNOSIS — M4696 Unspecified inflammatory spondylopathy, lumbar region: Secondary | ICD-10-CM | POA: Diagnosis not present

## 2019-10-27 DIAGNOSIS — Z79899 Other long term (current) drug therapy: Secondary | ICD-10-CM | POA: Diagnosis not present

## 2019-10-27 DIAGNOSIS — M47896 Other spondylosis, lumbar region: Secondary | ICD-10-CM | POA: Diagnosis not present

## 2019-10-27 DIAGNOSIS — M47816 Spondylosis without myelopathy or radiculopathy, lumbar region: Secondary | ICD-10-CM | POA: Diagnosis not present

## 2019-11-04 DIAGNOSIS — M545 Low back pain: Secondary | ICD-10-CM | POA: Diagnosis not present

## 2019-11-04 DIAGNOSIS — M533 Sacrococcygeal disorders, not elsewhere classified: Secondary | ICD-10-CM | POA: Diagnosis not present

## 2019-11-04 DIAGNOSIS — N3941 Urge incontinence: Secondary | ICD-10-CM | POA: Diagnosis not present

## 2019-11-04 DIAGNOSIS — M6281 Muscle weakness (generalized): Secondary | ICD-10-CM | POA: Diagnosis not present

## 2019-11-04 DIAGNOSIS — M25551 Pain in right hip: Secondary | ICD-10-CM | POA: Diagnosis not present

## 2019-11-04 DIAGNOSIS — N393 Stress incontinence (female) (male): Secondary | ICD-10-CM | POA: Diagnosis not present

## 2019-11-04 DIAGNOSIS — K5900 Constipation, unspecified: Secondary | ICD-10-CM | POA: Diagnosis not present

## 2019-11-04 DIAGNOSIS — M62838 Other muscle spasm: Secondary | ICD-10-CM | POA: Diagnosis not present

## 2019-11-06 ENCOUNTER — Ambulatory Visit
Admission: RE | Admit: 2019-11-06 | Discharge: 2019-11-06 | Disposition: A | Discharge status: Home / Self Care | Payer: Medicare Other | Source: Ambulatory Visit | Attending: Family Medicine | Admitting: Family Medicine

## 2019-11-06 ENCOUNTER — Other Ambulatory Visit: Payer: Self-pay

## 2019-11-06 DIAGNOSIS — M8580 Other specified disorders of bone density and structure, unspecified site: Secondary | ICD-10-CM

## 2019-11-06 DIAGNOSIS — M85852 Other specified disorders of bone density and structure, left thigh: Secondary | ICD-10-CM | POA: Diagnosis not present

## 2019-11-06 DIAGNOSIS — Z780 Asymptomatic menopausal state: Secondary | ICD-10-CM | POA: Diagnosis not present

## 2019-11-20 DIAGNOSIS — G8929 Other chronic pain: Secondary | ICD-10-CM | POA: Diagnosis not present

## 2019-11-20 DIAGNOSIS — M545 Low back pain: Secondary | ICD-10-CM | POA: Diagnosis not present

## 2019-11-24 ENCOUNTER — Other Ambulatory Visit: Payer: Self-pay | Admitting: Student

## 2019-11-24 DIAGNOSIS — M461 Sacroiliitis, not elsewhere classified: Secondary | ICD-10-CM

## 2019-11-26 ENCOUNTER — Telehealth: Payer: Self-pay

## 2019-11-26 NOTE — Progress Notes (Signed)
Abstract information from [facility] for upcoming Alif procedure. Minimal information given to enter into chart - updated new meds and problem list.   Courtney [name]

## 2019-11-26 NOTE — Telephone Encounter (Signed)
   Primary Cardiologist: [name], MD  Chart reviewed as part of pre-operative protocol coverage. Given past medical history and time since last visit, based on ACC/AHA guidelines, Courtney [name] would be at acceptable risk for the planned procedure without further cardiovascular testing.   I will route this recommendation to the requesting party via Epic fax function and remove from pre-op pool.  Please call with questions.  Jossie Ng. Maalik Pinn NP-C    11/26/2019, 10:21 AM [address] [address] Office [phone] Fax [phone]

## 2019-11-26 NOTE — Telephone Encounter (Signed)
   [facility] Pre-operative Risk Assessment    HEARTCARE STAFF: - Please ensure there is not already an duplicate clearance open for this procedure. - Under Visit Info/Reason for Call, type in Other and utilize the format Clearance MM/DD/YY or Clearance TBD. Do not use dashes or single digits. - If request is for dental extraction, please clarify the # of teeth to be extracted.  Request for surgical clearance:  1. What type of surgery is being performed?  L4-5 Anterior Lumbar Fusion, with posterior instrumentation    2. When is this surgery scheduled? TBD   3. What type of clearance is required (medical clearance vs. Pharmacy clearance to hold med vs. Both)?   4. Are there any medications that need to be held prior to surgery and how long? None   5. Practice name and name of physician performing surgery? [facility], MD   6. What is the office phone number? [phone]   7.   What is the office fax number? [place]   Anesthesia type (None, local, MAC, general) ? General    Mendel Ryder 11/26/2019, 9:14 AM  _________________________________________________________________   (provider comments below)

## 2019-11-27 DIAGNOSIS — M6281 Muscle weakness (generalized): Secondary | ICD-10-CM | POA: Diagnosis not present

## 2019-11-27 DIAGNOSIS — N3941 Urge incontinence: Secondary | ICD-10-CM | POA: Diagnosis not present

## 2019-11-27 DIAGNOSIS — M25551 Pain in right hip: Secondary | ICD-10-CM | POA: Diagnosis not present

## 2019-11-27 DIAGNOSIS — M545 Low back pain: Secondary | ICD-10-CM | POA: Diagnosis not present

## 2019-11-27 DIAGNOSIS — M62838 Other muscle spasm: Secondary | ICD-10-CM | POA: Diagnosis not present

## 2019-11-27 DIAGNOSIS — N393 Stress incontinence (female) (male): Secondary | ICD-10-CM | POA: Diagnosis not present

## 2019-11-27 DIAGNOSIS — K5900 Constipation, unspecified: Secondary | ICD-10-CM | POA: Diagnosis not present

## 2019-12-10 ENCOUNTER — Other Ambulatory Visit: Payer: Self-pay

## 2019-12-10 ENCOUNTER — Ambulatory Visit
Admission: RE | Admit: 2019-12-10 | Discharge: 2019-12-10 | Disposition: A | Discharge status: Home / Self Care | Payer: Medicare Other | Source: Ambulatory Visit | Attending: Student | Admitting: Student

## 2019-12-10 DIAGNOSIS — M461 Sacroiliitis, not elsewhere classified: Secondary | ICD-10-CM

## 2019-12-24 DIAGNOSIS — D485 Neoplasm of uncertain behavior of skin: Secondary | ICD-10-CM | POA: Diagnosis not present

## 2019-12-24 DIAGNOSIS — D3612 Benign neoplasm of peripheral nerves and autonomic nervous system, upper limb, including shoulder: Secondary | ICD-10-CM | POA: Diagnosis not present

## 2020-01-14 DIAGNOSIS — M79641 Pain in right hand: Secondary | ICD-10-CM | POA: Diagnosis not present

## 2020-01-14 DIAGNOSIS — M13842 Other specified arthritis, left hand: Secondary | ICD-10-CM | POA: Diagnosis not present

## 2020-01-14 DIAGNOSIS — M13841 Other specified arthritis, right hand: Secondary | ICD-10-CM | POA: Diagnosis not present

## 2020-01-15 DIAGNOSIS — M47896 Other spondylosis, lumbar region: Secondary | ICD-10-CM | POA: Diagnosis not present

## 2020-01-15 DIAGNOSIS — M47816 Spondylosis without myelopathy or radiculopathy, lumbar region: Secondary | ICD-10-CM | POA: Diagnosis not present

## 2020-01-19 DIAGNOSIS — N816 Rectocele: Secondary | ICD-10-CM | POA: Diagnosis not present

## 2020-01-19 DIAGNOSIS — Z800 Family history of malignant neoplasm of digestive organs: Secondary | ICD-10-CM | POA: Diagnosis not present

## 2020-01-19 DIAGNOSIS — Z803 Family history of malignant neoplasm of breast: Secondary | ICD-10-CM | POA: Diagnosis not present

## 2020-01-19 DIAGNOSIS — L900 Lichen sclerosus et atrophicus: Secondary | ICD-10-CM | POA: Diagnosis not present

## 2020-01-19 DIAGNOSIS — D259 Leiomyoma of uterus, unspecified: Secondary | ICD-10-CM | POA: Diagnosis not present

## 2020-01-19 DIAGNOSIS — N8110 Cystocele, unspecified: Secondary | ICD-10-CM | POA: Diagnosis not present

## 2020-01-19 DIAGNOSIS — N3946 Mixed incontinence: Secondary | ICD-10-CM | POA: Diagnosis not present

## 2020-01-19 DIAGNOSIS — N898 Other specified noninflammatory disorders of vagina: Secondary | ICD-10-CM | POA: Diagnosis not present

## 2020-02-05 DIAGNOSIS — M47816 Spondylosis without myelopathy or radiculopathy, lumbar region: Secondary | ICD-10-CM | POA: Diagnosis not present

## 2020-02-05 DIAGNOSIS — M47896 Other spondylosis, lumbar region: Secondary | ICD-10-CM | POA: Diagnosis not present

## 2020-02-20 DIAGNOSIS — M25341 Other instability, right hand: Secondary | ICD-10-CM | POA: Diagnosis not present

## 2020-02-20 DIAGNOSIS — M19041 Primary osteoarthritis, right hand: Secondary | ICD-10-CM | POA: Diagnosis not present

## 2020-02-20 DIAGNOSIS — G8918 Other acute postprocedural pain: Secondary | ICD-10-CM | POA: Diagnosis not present

## 2020-03-03 ENCOUNTER — Other Ambulatory Visit: Payer: Self-pay | Admitting: Neurological Surgery

## 2020-03-03 DIAGNOSIS — M13841 Other specified arthritis, right hand: Secondary | ICD-10-CM | POA: Diagnosis not present

## 2020-03-03 DIAGNOSIS — M13842 Other specified arthritis, left hand: Secondary | ICD-10-CM | POA: Diagnosis not present

## 2020-03-03 DIAGNOSIS — Z4789 Encounter for other orthopedic aftercare: Secondary | ICD-10-CM | POA: Diagnosis not present

## 2020-03-10 ENCOUNTER — Other Ambulatory Visit: Payer: Self-pay

## 2020-03-23 DIAGNOSIS — M25641 Stiffness of right hand, not elsewhere classified: Secondary | ICD-10-CM | POA: Diagnosis not present

## 2020-03-24 DIAGNOSIS — Z23 Encounter for immunization: Secondary | ICD-10-CM | POA: Diagnosis not present

## 2020-03-24 DIAGNOSIS — M13841 Other specified arthritis, right hand: Secondary | ICD-10-CM | POA: Diagnosis not present

## 2020-03-24 DIAGNOSIS — M13842 Other specified arthritis, left hand: Secondary | ICD-10-CM | POA: Diagnosis not present

## 2020-03-24 DIAGNOSIS — M79641 Pain in right hand: Secondary | ICD-10-CM | POA: Diagnosis not present

## 2020-03-24 DIAGNOSIS — Z4789 Encounter for other orthopedic aftercare: Secondary | ICD-10-CM | POA: Diagnosis not present

## 2020-04-01 DIAGNOSIS — M25641 Stiffness of right hand, not elsewhere classified: Secondary | ICD-10-CM | POA: Diagnosis not present

## 2020-04-04 DIAGNOSIS — Z23 Encounter for immunization: Secondary | ICD-10-CM | POA: Diagnosis not present

## 2020-04-06 ENCOUNTER — Ambulatory Visit (INDEPENDENT_AMBULATORY_CARE_PROVIDER_SITE_OTHER): Payer: Medicare Other | Admitting: Vascular Surgery

## 2020-04-06 ENCOUNTER — Encounter: Payer: Self-pay | Admitting: Vascular Surgery

## 2020-04-06 ENCOUNTER — Other Ambulatory Visit: Payer: Self-pay

## 2020-04-06 VITALS — BP 119/50 | HR 77 | Temp 97.9°F | Resp 16 | Ht 64.0 in | Wt 132.0 lb

## 2020-04-06 DIAGNOSIS — M5416 Radiculopathy, lumbar region: Secondary | ICD-10-CM

## 2020-04-06 NOTE — Progress Notes (Signed)
Patient name: Courtney [name] MRN: [id number] DOB: May 26, 1951 Sex: female  REASON FOR CONSULT: Evaluate for L4-L5 ALIF  HPI: Courtney [name] is a 69 y.o. female with history of hyperlipidemia and bicuspid aortic valve that presents for preop evaluation of planned L4-L5 ALIF.  Patient describes at least 10 years of chronic lower back pain that has progressively gotten worse over time.  She is having radiculopathy in the right leg at this time.  She has had previous anterior cervical disc fusion as well as an L3-4 laminectomy in 2016 and an L4-5 L5-S1 laminectomy in 2019 and a right SI joint fusion 2020 by Dr. Rolena Infante.  She is now being seen by Dr. Ronnald Ramp.  He has recommended an L4-L5 ALIF.  Previous abdominal surgery includes bilateral open inguinal hernia repairs and an abdominoplasty.  Past Medical History:  Diagnosis Date  . ADHD    ADD  . Allergic to pets    pet dander  . Allergies   . Anemia   . Arthritis   . Asthma   . Bicuspid aortic valve    bicuspid AV with no stenosis or regurgitation 01/2015 echo   . Complication of anesthesia    slow to wake up and lingers for a long time  . Concussion   . Depression   . Family history of adverse reaction to anesthesia    mom had skin reaction to anesthesia but cant remember what it was or exactly what happened  . Fatigue   . Fibromyalgia   . GERD (gastroesophageal reflux disease)   . Hyperlipidemia   . Hypothyroidism   . Irregular periods   . Joint pain   . Joint stiffness   . Lower back pain   . Menopause   . Migraines   . Numbness and tingling   . Pollen allergy   . Ringing in ears   . Sleep apnea    does not wear CPAP  . Snoring     Past Surgical History:  Procedure Laterality Date  . ABDOMINOPLASTY    . ANTERIOR CERVICAL DECOMP/DISCECTOMY FUSION N/A 06/28/2016   Procedure: ACDF C5-7 ANTERIOR CERVICAL DECOMPRESSION/DISCECTOMY FUSION 2 LEVELS;  Surgeon: Melina Schools, MD;  Location: [facility];  Service: Orthopedics;   Laterality: N/A;  Requests 3 hrs  . COLONOSCOPY    . DENTAL SURGERY    . HERNIA REPAIR     times 2  . LUMBAR LAMINECTOMY/DECOMPRESSION MICRODISCECTOMY  04/21/2015   Procedure: DECOMPRESSION LUMBAR THREE-LUMBAR FOUR;  Surgeon: Melina Schools, MD;  Location: [facility];  Service: Orthopedics;;  . LUMBAR LAMINECTOMY/DECOMPRESSION MICRODISCECTOMY N/A 06/20/2017   Procedure: L4-5 decompression, L5-S1 left laminotomy/foraminotomy;  Surgeon: Melina Schools, MD;  Location: [facility];  Service: Orthopedics;  Laterality: N/A;  3 hrs  . PARATHYROIDECTOMY  2012  . SACROILIAC JOINT FUSION Right 07/24/2018   Procedure: SACROILIAC JOINT FUSION;  Surgeon: Melina Schools, MD;  Location: [facility];  Service: Orthopedics;  Laterality: Right;  90 mins  . SHOULDER ARTHROSCOPY W/ ACROMIAL REPAIR Right   . TONSILLECTOMY      Family History  Problem Relation Age of Onset  . COPD Mother   . Cancer Mother   . Hypertension Mother   . Hyperlipidemia Mother   . Heart disease Mother   . Thyroid disease Mother   . Thyroid disease Sister   . Thyroid disease Daughter     SOCIAL HISTORY: Social History   Socioeconomic History  . Marital status: Widowed    Spouse name: Not on  file  . Number of children: 2  . Years of education: Master's   . Highest education level: Not on file  Occupational History  . Occupation: Retired   Tobacco Use  . Smoking status: Former Research scientist (life sciences)  . Smokeless tobacco: Never Used  . Tobacco comment: social smoker stop at age 4  Vaping Use  . Vaping Use: Never used  Substance and Sexual Activity  . Alcohol use: Yes    Alcohol/week: 1.0 standard drink    Types: 1 Glasses of wine per week    Comment: socially  . Drug use: No  . Sexual activity: Never  Other Topics Concern  . Not on file  Social History Narrative   Lives alone   Caffeine use: 1 cup coffee/day   Social Determinants of Health   Financial Resource Strain:   . Difficulty of Paying Living Expenses: Not on file  Food Insecurity:    . Worried About Charity fundraiser in the Last Year: Not on file  . Ran Out of Food in the Last Year: Not on file  Transportation Needs:   . Lack of Transportation (Medical): Not on file  . Lack of Transportation (Non-Medical): Not on file  Physical Activity:   . Days of Exercise per Week: Not on file  . Minutes of Exercise per Session: Not on file  Stress:   . Feeling of Stress : Not on file  Social Connections:   . Frequency of Communication with Friends and Family: Not on file  . Frequency of Social Gatherings with Friends and Family: Not on file  . Attends Religious Services: Not on file  . Active Member of Clubs or Organizations: Not on file  . Attends Archivist Meetings: Not on file  . Marital Status: Not on file  Intimate Partner Violence:   . Fear of Current or Ex-Partner: Not on file  . Emotionally Abused: Not on file  . Physically Abused: Not on file  . Sexually Abused: Not on file    Allergies  Allergen Reactions  . Fish Allergy Anaphylaxis    Can tolerate shellfish. Allergic to fish with shells and fins.  . Peanuts [Peanut Oil] Anaphylaxis    Can eat cashews, pistachios, and almonds    Current Outpatient Medications  Medication Sig Dispense Refill  . acetaminophen (TYLENOL) 500 MG tablet Take 500 mg by mouth daily.    . Artificial Tear Solution (GENTEAL TEARS OP) Place 1 drop into both eyes daily as needed (dry eyes).    . Ascorbic Acid (VITAMIN C) 1000 MG tablet Take 1,000 mg by mouth daily.     Marland Kitchen atorvastatin (LIPITOR) 10 MG tablet Take 10 mg by mouth every other day. In the evening    . Carboxymethylcellul-Glycerin (REFRESH OPTIVE OP) Place 1 drop into both eyes daily as needed (dry eyes).    . cholecalciferol (VITAMIN D3) 25 MCG (1000 UNIT) tablet Take 1,000 Units by mouth 2 (two) times daily.    . clobetasol ointment (TEMOVATE) 7.06 % Apply 1 application topically every 30 (thirty) days.    . Coenzyme Q10 300 MG CAPS Take 300 mg by mouth daily.      . diclofenac sodium (VOLTAREN) 1 % GEL Apply 1 application topically daily as needed (pain).     Marland Kitchen docusate sodium (COLACE) 100 MG capsule Take 100 mg by mouth 2 (two) times daily.    . DULoxetine (CYMBALTA) 30 MG capsule Take 30 mg by mouth daily after breakfast.     .  EPINEPHrine (EPIPEN JR 2-PAK) 0.15 MG/0.3ML injection Inject 0.15 mg into the muscle as needed for anaphylaxis.    Marland Kitchen estradiol (ESTRACE) 0.1 MG/GM vaginal cream Place 0.1 g vaginally every 3 (three) days.     Lyndle Herrlich BISGLYCINATE CHELATE PO Take 25 mg by mouth daily.     . fluticasone (FLONASE) 50 MCG/ACT nasal spray Place 1 spray into both nostrils at bedtime.     . lactobacillus acidophilus (BACID) TABS tablet Take 1 tablet by mouth daily.    Marland Kitchen levothyroxine (SYNTHROID, LEVOTHROID) 25 MCG tablet Take 1 1/2 tablets daily (Patient taking differently: Take 37.5 mcg by mouth daily before breakfast. ) 130 tablet 2  . Lidocaine 4 % PTCH Apply 1 patch topically daily as needed (pain).     Marland Kitchen loratadine (CLARITIN) 10 MG tablet Take 10 mg by mouth daily as needed (for seasonal allergies (Spring/Summer)).     Marland Kitchen MAGNESIUM CITRATE PO Take 1,000 mg by mouth 2 (two) times daily.     . meloxicam (MOBIC) 15 MG tablet Take 15 mg by mouth daily.    . Multiple Vitamins-Minerals (MULTIVITAMIN WITH MINERALS) tablet Take 1 tablet by mouth every other day.     . naproxen sodium (ALEVE) 220 MG tablet Take 220 mg by mouth daily as needed (pain).    . Omega-3 Fatty Acids (OMEGA 3 PO) Take 2,560 mg by mouth at bedtime.     . Oxycodone HCl 10 MG TABS Take 7.5 mg by mouth 3 (three) times daily.    . Potassium 99 MG TABS Take 99 mg by mouth 2 (two) times daily.     . traZODone (DESYREL) 100 MG tablet Take 33-50 mg by mouth at bedtime.     . urea (CARMOL) 40 % CREA Apply 1 application topically once a week.     . vitamin E 400 UNIT capsule Take 400 Units by mouth every 3 (three) days.     Marland Kitchen amoxicillin (AMOXIL) 500 MG capsule Take 2,000 mg by mouth See  admin instructions. Take 2000 mg 1 hour prior to dental appt (Patient not taking: Reported on 04/06/2020)     No current facility-administered medications for this visit.    REVIEW OF SYSTEMS:  [X]  denotes positive finding, [ ]  denotes negative finding Cardiac  Comments:  Chest pain or chest pressure:    Shortness of breath upon exertion:    Short of breath when lying flat:    Irregular heart rhythm:        Vascular    Pain in calf, thigh, or hip brought on by ambulation:    Pain in feet at night that wakes you up from your sleep:     Blood clot in your veins:    Leg swelling:         Pulmonary    Oxygen at home:    Productive cough:     Wheezing:         Neurologic    Sudden weakness in arms or legs:     Sudden numbness in arms or legs:     Sudden onset of difficulty speaking or slurred speech:    Temporary loss of vision in one eye:     Problems with dizziness:         Gastrointestinal    Blood in stool:     Vomited blood:         Genitourinary    Burning when urinating:     Blood in urine:  Psychiatric    Major depression:         Hematologic    Bleeding problems:    Problems with blood clotting too easily:        Skin    Rashes or ulcers:        Constitutional    Fever or chills:      PHYSICAL EXAM: Vitals:   04/06/20 1129  BP: (!) 119/50  Pulse: 77  Resp: 16  Temp: 97.9 F (36.6 C)  TempSrc: Temporal  SpO2: 94%  Weight: 132 lb (59.9 kg)  Height: 5\' 4"  (1.626 m)    GENERAL: The patient is a well-nourished female, in no acute distress. The vital signs are documented above. CARDIAC: There is a regular rate and rhythm.  VASCULAR:  Palpable femoral pulses bilaterally Palpable DP pulses bilaterally PULMONARY: No respiratory distress. ABDOMEN: Soft and non-tender.  Open bilateral groin incisions from previous hernia repair.  Lower abdominoplasty incision. MUSCULOSKELETAL: There are no major deformities or cyanosis. NEUROLOGIC: No focal  weakness or paresthesias are detected. SKIN: There are no ulcers or rashes noted. PSYCHIATRIC: The patient has a normal affect.  DATA:   Reviewed her MRI lumbar spine as well as a previous CTA and no significant iliac artery calcification.  MRI 10/20/2019 shows iliac vein bifurcation at L4 with a nice plane posteriorly  Assessment/Plan:  69 year old female presents for preop evaluation of L4-L5 ALIF in the setting of chronic lower back pain.  Discussed that given her previous abdominoplasty incision would likely be a paramedian incision over the left rectus muscle.  Discussed entering the retroperitoneal space lateral to the muscle and mobilizing the intestines as well as peritoneum and left ureter across midline.  Discussed mobilizing the left iliac artery and vein to expose L4-L5 disc space.  Talked about risk of injury to the above structures.  After review of her MRI and her abdomen I think she would be a good candidate for anterior approach and look forward to assisting Dr. Ronnald Ramp.  Currently scheduled for Monday.   [name], MD [facility] Office: [phone]

## 2020-04-07 NOTE — Pre-Procedure Instructions (Signed)
[facility] [place], [address] [place] [address] Phone: [phone] Fax: [phone]  [place], [address], [address] [place], [address] [place] [address] Phone: [phone] Fax: [phone]    Your procedure is scheduled on Mon., Nov. 1, 2021 from 7:30AM-1:39PM  Report to [facility]" at 5:30AM  Call this number if you have problems the morning of surgery:  [phone]   Remember:  Do not eat or drink after midnight on Oct. 31st    Take these medicines the morning of surgery with A SIP OF WATER: Acetaminophen (TYLENOL)     Docusate sodium (COLACE)   DULoxetine (CYMBALTA) Levothyroxine (SYNTHROID, LEVOTHROID) Oxycodone HCl  If Needed: Artificial Tear Solution (GENTEAL TEARS OP) Carboxymethylcellul-Glycerin (REFRESH OPTIVE OP) EPINEPHrine (EPIPEN JR) Loratadine (CLARITIN)  As of today, STOP taking all Aspirin (unless instructed by your doctor) and Other Aspirin containing products, Vitamins, Fish oils, and Herbal medications. Also stop all NSAIDS i.e. Advil, Ibuprofen, Motrin, Aleve, Anaprox, Naproxen, BC, Goody Powders, and all Supplements. Including: Meloxicam (MOBIC)   No Smoking of any kind, Tobacco/Vaping, or Alcohol products 24 hours prior to your procedure. If you use a Cpap at night, you may bring all equipment for your overnight stay.   Special instructions:  [facility]- Preparing For Surgery  Before surgery, you can play an important role. Because skin is not sterile, your skin needs to be as free of germs as possible. You can reduce the number of germs on your skin by washing with CHG (chlorahexidine gluconate) Soap before surgery.  CHG is an antiseptic cleaner which kills germs and bonds with the skin to continue killing germs even after washing.    Please do not use if you have an allergy to  CHG or antibacterial soaps. If your skin becomes reddened/irritated stop using the CHG.  Do not shave (including legs and underarms) for at least 48 hours prior to first CHG shower. It is OK to shave your face.  Please follow these instructions carefully.   1. Shower the NIGHT BEFORE SURGERY and the MORNING OF SURGERY with CHG.   2. If you chose to wash your hair, wash your hair first as usual with your normal shampoo.  3. After you shampoo, rinse your hair and body thoroughly to remove the shampoo.  4. Use CHG as you would any other liquid soap. You can apply CHG directly to the skin and wash gently with a scrungie or a clean washcloth.   5. Apply the CHG Soap to your body ONLY FROM THE NECK DOWN.  Do not use on open wounds or open sores. Avoid contact with your eyes, ears, mouth and genitals (private parts). Wash Face and genitals (private parts)  with your normal soap.  6. Wash thoroughly, paying special attention to the area where your surgery will be performed.  7. Thoroughly rinse your body with warm water from the neck down.  8. DO NOT shower/wash with your normal soap after using and rinsing off the CHG Soap.  9. Pat yourself dry with a CLEAN TOWEL.  10. Wear CLEAN PAJAMAS to bed the night before surgery, wear comfortable clothes the morning of surgery  11. Place CLEAN SHEETS on your bed the night of your first shower and DO NOT SLEEP WITH PETS.   Day of Surgery:  Remember to brush your teeth WITH YOUR REGULAR TOOTHPASTE.  Do not wear jewelry, make-up or nail polish.  Do not wear lotions, powders, or perfumes, or deodorant.  Do not shave 48 hours prior to surgery.   Do not bring valuables to the hospital.  [facility] is not responsible for any belongings or valuables.  Contacts, dentures or bridgework may not be worn into surgery.    For patients admitted to the hospital, discharge time will be determined by your treatment team.  Patients discharged the day  of surgery will not be allowed to drive home, and someone age 19 and over needs to stay with them for 24 hours.   Please wear clean clothes to the hospital/surgery center.    Please read over the following fact sheets that you were given.

## 2020-04-08 ENCOUNTER — Encounter (HOSPITAL_COMMUNITY): Payer: Self-pay

## 2020-04-08 ENCOUNTER — Other Ambulatory Visit (HOSPITAL_COMMUNITY)
Admission: RE | Admit: 2020-04-08 | Discharge: 2020-04-08 | Disposition: A | Discharge status: Home / Self Care | Payer: Medicare Other | Source: Ambulatory Visit | Attending: Neurological Surgery | Admitting: Neurological Surgery

## 2020-04-08 ENCOUNTER — Encounter (HOSPITAL_COMMUNITY)
Admission: RE | Admit: 2020-04-08 | Discharge: 2020-04-08 | Disposition: A | Discharge status: Home / Self Care | Payer: Medicare Other | Source: Ambulatory Visit

## 2020-04-08 ENCOUNTER — Ambulatory Visit (HOSPITAL_COMMUNITY)
Admission: RE | Admit: 2020-04-08 | Discharge: 2020-04-08 | Disposition: A | Discharge status: Home / Self Care | Payer: Medicare Other | Source: Ambulatory Visit | Attending: Neurological Surgery | Admitting: Neurological Surgery

## 2020-04-08 ENCOUNTER — Other Ambulatory Visit: Payer: Self-pay

## 2020-04-08 DIAGNOSIS — M961 Postlaminectomy syndrome, not elsewhere classified: Secondary | ICD-10-CM

## 2020-04-08 DIAGNOSIS — R918 Other nonspecific abnormal finding of lung field: Secondary | ICD-10-CM | POA: Diagnosis not present

## 2020-04-08 DIAGNOSIS — Z01818 Encounter for other preprocedural examination: Secondary | ICD-10-CM | POA: Diagnosis not present

## 2020-04-08 DIAGNOSIS — Z20822 Contact with and (suspected) exposure to covid-19: Secondary | ICD-10-CM | POA: Diagnosis not present

## 2020-04-08 LAB — CBC WITH DIFFERENTIAL/PLATELET
Abs Immature Granulocytes: 0 10*3/uL (ref 0.00–0.07)
Basophils Absolute: 0.1 10*3/uL (ref 0.0–0.1)
Basophils Relative: 2 %
Eosinophils Absolute: 0.2 10*3/uL (ref 0.0–0.5)
Eosinophils Relative: 3 %
HCT: 36.3 % (ref 36.0–46.0)
Hemoglobin: 12.1 g/dL (ref 12.0–15.0)
Immature Granulocytes: 0 %
Lymphocytes Relative: 36 %
Lymphs Abs: 1.7 10*3/uL (ref 0.7–4.0)
MCH: 32.4 pg (ref 26.0–34.0)
MCHC: 33.3 g/dL (ref 30.0–36.0)
MCV: 97.1 fL (ref 80.0–100.0)
Monocytes Absolute: 0.5 10*3/uL (ref 0.1–1.0)
Monocytes Relative: 10 %
Neutro Abs: 2.4 10*3/uL (ref 1.7–7.7)
Neutrophils Relative %: 49 %
Platelets: 189 10*3/uL (ref 150–400)
RBC: 3.74 MIL/uL — ABNORMAL LOW (ref 3.87–5.11)
RDW: 12.3 % (ref 11.5–15.5)
WBC: 4.7 10*3/uL (ref 4.0–10.5)
nRBC: 0 % (ref 0.0–0.2)

## 2020-04-08 LAB — BASIC METABOLIC PANEL
Anion gap: 11 (ref 5–15)
BUN: 17 mg/dL (ref 8–23)
CO2: 28 mmol/L (ref 22–32)
Calcium: 9.6 mg/dL (ref 8.9–10.3)
Chloride: 100 mmol/L (ref 98–111)
Creatinine, Ser: 0.76 mg/dL (ref 0.44–1.00)
GFR, Estimated: >60 mL/min (ref >60)
Glucose, Bld: 102 mg/dL — ABNORMAL HIGH (ref 70–99)
Potassium: 4.2 mmol/L (ref 3.5–5.1)
Sodium: 139 mmol/L (ref 135–145)

## 2020-04-08 LAB — TYPE AND SCREEN
ABO/RH(D): O POS
Antibody Screen: NEGATIVE

## 2020-04-08 LAB — SURGICAL PCR SCREEN
MRSA, PCR: NEGATIVE
Staphylococcus aureus: NEGATIVE

## 2020-04-08 LAB — PROTIME-INR
INR: 1 (ref 0.8–1.2)
Prothrombin Time: 12.7 seconds (ref 11.4–15.2)

## 2020-04-08 LAB — SARS CORONAVIRUS 2 (TAT 6-24 HRS): SARS Coronavirus 2: NEGATIVE

## 2020-04-08 NOTE — Progress Notes (Addendum)
PCP - Dr. Laurann Montana Cardiologist - Dr. Acie Fredrickson   Chest x-ray - 04/08/20 EKG - 09/25/19 Stress Test - pt denies ECHO - 10/14/19 Cardiac Cath - pt denies  Sleep Study - yes CPAP - no    Blood Thinner Instructions: n/a Aspirin Instructions: n/a  ERAS Protcol - n/a PRE-SURGERY Ensure or G2- n/a  COVID TEST- 04/08/20   Anesthesia review: n/a  Patient denies shortness of breath, fever, cough and chest pain at PAT appointment   All instructions explained to the patient, with a verbal understanding of the material. Patient agrees to go over the instructions while at home for a better understanding. Patient also instructed to self quarantine after being tested for COVID-19. The opportunity to ask questions was provided.

## 2020-04-09 DIAGNOSIS — M25641 Stiffness of right hand, not elsewhere classified: Secondary | ICD-10-CM | POA: Diagnosis not present

## 2020-04-12 ENCOUNTER — Encounter (HOSPITAL_COMMUNITY): Payer: Self-pay | Admitting: Neurological Surgery

## 2020-04-12 ENCOUNTER — Inpatient Hospital Stay (HOSPITAL_COMMUNITY): Payer: Medicare Other

## 2020-04-12 ENCOUNTER — Inpatient Hospital Stay (HOSPITAL_COMMUNITY): Payer: Medicare Other | Admitting: Anesthesiology

## 2020-04-12 ENCOUNTER — Inpatient Hospital Stay (HOSPITAL_COMMUNITY)
Admission: RE | Admit: 2020-04-12 | Discharge: 2020-04-14 | DRG: 454 | Disposition: A | Discharge status: Home / Self Care | Payer: Medicare Other | Attending: Neurological Surgery | Admitting: Neurological Surgery

## 2020-04-12 ENCOUNTER — Other Ambulatory Visit: Payer: Self-pay

## 2020-04-12 ENCOUNTER — Encounter (HOSPITAL_COMMUNITY)
Admission: RE | Disposition: A | Discharge status: Home / Self Care | Payer: Self-pay | Source: Home / Self Care | Attending: Neurological Surgery

## 2020-04-12 DIAGNOSIS — M797 Fibromyalgia: Secondary | ICD-10-CM | POA: Diagnosis not present

## 2020-04-12 DIAGNOSIS — E039 Hypothyroidism, unspecified: Secondary | ICD-10-CM | POA: Diagnosis present

## 2020-04-12 DIAGNOSIS — G4730 Sleep apnea, unspecified: Secondary | ICD-10-CM | POA: Diagnosis present

## 2020-04-12 DIAGNOSIS — M48062 Spinal stenosis, lumbar region with neurogenic claudication: Secondary | ICD-10-CM | POA: Diagnosis not present

## 2020-04-12 DIAGNOSIS — E785 Hyperlipidemia, unspecified: Secondary | ICD-10-CM | POA: Diagnosis present

## 2020-04-12 DIAGNOSIS — M4716 Other spondylosis with myelopathy, lumbar region: Secondary | ICD-10-CM | POA: Diagnosis not present

## 2020-04-12 DIAGNOSIS — Z8349 Family history of other endocrine, nutritional and metabolic diseases: Secondary | ICD-10-CM | POA: Diagnosis not present

## 2020-04-12 DIAGNOSIS — K219 Gastro-esophageal reflux disease without esophagitis: Secondary | ICD-10-CM | POA: Diagnosis not present

## 2020-04-12 DIAGNOSIS — Z79899 Other long term (current) drug therapy: Secondary | ICD-10-CM

## 2020-04-12 DIAGNOSIS — Y838 Other surgical procedures as the cause of abnormal reaction of the patient, or of later complication, without mention of misadventure at the time of the procedure: Secondary | ICD-10-CM | POA: Diagnosis present

## 2020-04-12 DIAGNOSIS — Z79891 Long term (current) use of opiate analgesic: Secondary | ICD-10-CM | POA: Diagnosis not present

## 2020-04-12 DIAGNOSIS — Z09 Encounter for follow-up examination after completed treatment for conditions other than malignant neoplasm: Secondary | ICD-10-CM

## 2020-04-12 DIAGNOSIS — M961 Postlaminectomy syndrome, not elsewhere classified: Principal | ICD-10-CM | POA: Diagnosis present

## 2020-04-12 DIAGNOSIS — Z83438 Family history of other disorder of lipoprotein metabolism and other lipidemia: Secondary | ICD-10-CM | POA: Diagnosis not present

## 2020-04-12 DIAGNOSIS — Q231 Congenital insufficiency of aortic valve: Secondary | ICD-10-CM

## 2020-04-12 DIAGNOSIS — F909 Attention-deficit hyperactivity disorder, unspecified type: Secondary | ICD-10-CM | POA: Diagnosis present

## 2020-04-12 DIAGNOSIS — Z87891 Personal history of nicotine dependence: Secondary | ICD-10-CM

## 2020-04-12 DIAGNOSIS — M5416 Radiculopathy, lumbar region: Secondary | ICD-10-CM | POA: Diagnosis present

## 2020-04-12 DIAGNOSIS — Z91010 Allergy to peanuts: Secondary | ICD-10-CM

## 2020-04-12 DIAGNOSIS — J45909 Unspecified asthma, uncomplicated: Secondary | ICD-10-CM | POA: Diagnosis present

## 2020-04-12 DIAGNOSIS — G8929 Other chronic pain: Secondary | ICD-10-CM | POA: Diagnosis present

## 2020-04-12 DIAGNOSIS — Z79890 Hormone replacement therapy (postmenopausal): Secondary | ICD-10-CM

## 2020-04-12 DIAGNOSIS — D62 Acute posthemorrhagic anemia: Secondary | ICD-10-CM | POA: Diagnosis not present

## 2020-04-12 DIAGNOSIS — Z791 Long term (current) use of non-steroidal anti-inflammatories (NSAID): Secondary | ICD-10-CM

## 2020-04-12 DIAGNOSIS — M4316 Spondylolisthesis, lumbar region: Secondary | ICD-10-CM | POA: Diagnosis present

## 2020-04-12 DIAGNOSIS — Z91013 Allergy to seafood: Secondary | ICD-10-CM

## 2020-04-12 DIAGNOSIS — M4326 Fusion of spine, lumbar region: Secondary | ICD-10-CM | POA: Diagnosis not present

## 2020-04-12 DIAGNOSIS — Z825 Family history of asthma and other chronic lower respiratory diseases: Secondary | ICD-10-CM

## 2020-04-12 DIAGNOSIS — F32A Depression, unspecified: Secondary | ICD-10-CM | POA: Diagnosis present

## 2020-04-12 DIAGNOSIS — Z981 Arthrodesis status: Secondary | ICD-10-CM

## 2020-04-12 DIAGNOSIS — S30850A Superficial foreign body of lower back and pelvis, initial encounter: Secondary | ICD-10-CM | POA: Diagnosis not present

## 2020-04-12 DIAGNOSIS — M545 Low back pain, unspecified: Secondary | ICD-10-CM | POA: Diagnosis not present

## 2020-04-12 HISTORY — PX: ANTERIOR LUMBAR FUSION: SHX1170

## 2020-04-12 HISTORY — PX: LAMINECTOMY WITH POSTERIOR LATERAL ARTHRODESIS LEVEL 1: SHX6335

## 2020-04-12 HISTORY — PX: ABDOMINAL EXPOSURE: SHX5708

## 2020-04-12 LAB — CBC
HCT: 28.3 % — ABNORMAL LOW (ref 36.0–46.0)
Hemoglobin: 9.4 g/dL — ABNORMAL LOW (ref 12.0–15.0)
MCH: 32.6 pg (ref 26.0–34.0)
MCHC: 33.2 g/dL (ref 30.0–36.0)
MCV: 98.3 fL (ref 80.0–100.0)
Platelets: 173 10*3/uL (ref 150–400)
RBC: 2.88 MIL/uL — ABNORMAL LOW (ref 3.87–5.11)
RDW: 12 % (ref 11.5–15.5)
WBC: 13.3 10*3/uL — ABNORMAL HIGH (ref 4.0–10.5)
nRBC: 0 % (ref 0.0–0.2)

## 2020-04-12 SURGERY — ANTERIOR LUMBAR FUSION 1 LEVEL
Anesthesia: General | Site: Back

## 2020-04-12 MED ORDER — ORAL CARE MOUTH RINSE: 15.0000 mL | Freq: Once | OROMUCOSAL | Status: AC

## 2020-04-12 MED ORDER — PROMETHAZINE HCL 25 MG/ML IJ SOLN: 6.2500 mg | INTRAMUSCULAR | Status: DC | PRN

## 2020-04-12 MED ORDER — THROMBIN 5000 UNITS EX SOLR
OROMUCOSAL | Status: DC | PRN
  Administered 2020-04-12: 5 mL via TOPICAL

## 2020-04-12 MED ORDER — LIDOCAINE 2% (20 MG/ML) 5 ML SYRINGE
INTRAMUSCULAR | Status: AC
  Filled 2020-04-12: qty 5

## 2020-04-12 MED ORDER — ACETAMINOPHEN 650 MG RE SUPP: 650.0000 mg | RECTAL | Status: DC | PRN

## 2020-04-12 MED ORDER — DEXAMETHASONE SODIUM PHOSPHATE 10 MG/ML IJ SOLN
10.0000 mg | Freq: Once | INTRAMUSCULAR | Status: AC
  Administered 2020-04-12: 10 mg via INTRAVENOUS
  Filled 2020-04-12: qty 1

## 2020-04-12 MED ORDER — FENTANYL CITRATE (PF) 250 MCG/5ML IJ SOLN
INTRAMUSCULAR | Status: DC | PRN
  Administered 2020-04-12 (×2): 100 ug via INTRAVENOUS
  Administered 2020-04-12: 50 ug via INTRAVENOUS

## 2020-04-12 MED ORDER — MENTHOL 3 MG MT LOZG: 1.0000 | LOZENGE | OROMUCOSAL | Status: DC | PRN

## 2020-04-12 MED ORDER — CHLORHEXIDINE GLUCONATE CLOTH 2 % EX PADS: 6.0000 | MEDICATED_PAD | Freq: Once | CUTANEOUS | Status: DC

## 2020-04-12 MED ORDER — ALBUMIN HUMAN 5 % IV SOLN
12.5000 g | Freq: Once | INTRAVENOUS | Status: AC
  Administered 2020-04-12: 12.5 g via INTRAVENOUS

## 2020-04-12 MED ORDER — SENNA 8.6 MG PO TABS
1.0000 | ORAL_TABLET | Freq: Two times a day (BID) | ORAL | Status: DC
  Administered 2020-04-12 – 2020-04-14 (×4): 8.6 mg via ORAL
  Filled 2020-04-12 (×4): qty 1

## 2020-04-12 MED ORDER — 0.9 % SODIUM CHLORIDE (POUR BTL) OPTIME
TOPICAL | Status: DC | PRN
  Administered 2020-04-12 (×2): 1000 mL

## 2020-04-12 MED ORDER — PHENYLEPHRINE 40 MCG/ML (10ML) SYRINGE FOR IV PUSH (FOR BLOOD PRESSURE SUPPORT)
PREFILLED_SYRINGE | INTRAVENOUS | Status: AC
  Filled 2020-04-12: qty 10

## 2020-04-12 MED ORDER — POTASSIUM CHLORIDE IN NACL 20-0.9 MEQ/L-% IV SOLN: INTRAVENOUS | Status: DC

## 2020-04-12 MED ORDER — POTASSIUM 99 MG PO TABS: 99.0000 mg | ORAL_TABLET | Freq: Two times a day (BID) | ORAL | Status: DC

## 2020-04-12 MED ORDER — HYDROMORPHONE HCL 1 MG/ML IJ SOLN
INTRAMUSCULAR | Status: AC
  Filled 2020-04-12: qty 1

## 2020-04-12 MED ORDER — FERROUS GLUCONATE 324 (38 FE) MG PO TABS
324.0000 mg | ORAL_TABLET | Freq: Every day | ORAL | Status: DC
  Filled 2020-04-12 (×3): qty 1

## 2020-04-12 MED ORDER — METHOCARBAMOL 1000 MG/10ML IJ SOLN
500.0000 mg | Freq: Four times a day (QID) | INTRAVENOUS | Status: DC | PRN
  Filled 2020-04-12: qty 5

## 2020-04-12 MED ORDER — OXYCODONE HCL 5 MG PO TABS
ORAL_TABLET | ORAL | Status: AC
  Filled 2020-04-12: qty 1

## 2020-04-12 MED ORDER — SODIUM CHLORIDE 0.9 % IV SOLN: 250.0000 mL | INTRAVENOUS | Status: DC

## 2020-04-12 MED ORDER — ACETAMINOPHEN 325 MG PO TABS
650.0000 mg | ORAL_TABLET | ORAL | Status: DC | PRN
  Filled 2020-04-12: qty 2

## 2020-04-12 MED ORDER — FENTANYL CITRATE (PF) 100 MCG/2ML IJ SOLN
25.0000 ug | INTRAMUSCULAR | Status: DC | PRN
  Administered 2020-04-12: 25 ug via INTRAVENOUS

## 2020-04-12 MED ORDER — PHENOL 1.4 % MT LIQD: 1.0000 | OROMUCOSAL | Status: DC | PRN

## 2020-04-12 MED ORDER — MIDAZOLAM HCL 2 MG/2ML IJ SOLN
INTRAMUSCULAR | Status: AC
  Filled 2020-04-12: qty 2

## 2020-04-12 MED ORDER — ROCURONIUM BROMIDE 10 MG/ML (PF) SYRINGE
PREFILLED_SYRINGE | INTRAVENOUS | Status: AC
  Filled 2020-04-12: qty 10

## 2020-04-12 MED ORDER — VANCOMYCIN HCL 1000 MG IV SOLR
INTRAVENOUS | Status: DC | PRN
  Administered 2020-04-12: 1000 mg

## 2020-04-12 MED ORDER — ACETAMINOPHEN 10 MG/ML IV SOLN
INTRAVENOUS | Status: DC | PRN
  Administered 2020-04-12: 1000 mg via INTRAVENOUS

## 2020-04-12 MED ORDER — OXYCODONE HCL 5 MG PO TABS
5.0000 mg | ORAL_TABLET | Freq: Once | ORAL | Status: AC | PRN
  Administered 2020-04-12: 5 mg via ORAL

## 2020-04-12 MED ORDER — ACETAMINOPHEN 10 MG/ML IV SOLN
INTRAVENOUS | Status: AC
  Filled 2020-04-12: qty 100

## 2020-04-12 MED ORDER — CHLORHEXIDINE GLUCONATE 4 % EX LIQD: 60.0000 mL | Freq: Once | CUTANEOUS | Status: DC

## 2020-04-12 MED ORDER — LIDOCAINE 2% (20 MG/ML) 5 ML SYRINGE
INTRAMUSCULAR | Status: DC | PRN
  Administered 2020-04-12: 100 mg via INTRAVENOUS

## 2020-04-12 MED ORDER — SODIUM CHLORIDE 0.9% FLUSH
3.0000 mL | Freq: Two times a day (BID) | INTRAVENOUS | Status: DC
  Administered 2020-04-12 – 2020-04-13 (×2): 3 mL via INTRAVENOUS

## 2020-04-12 MED ORDER — CHLORHEXIDINE GLUCONATE 0.12 % MT SOLN
15.0000 mL | Freq: Once | OROMUCOSAL | Status: AC
  Administered 2020-04-12: 15 mL via OROMUCOSAL
  Filled 2020-04-12: qty 15

## 2020-04-12 MED ORDER — ONDANSETRON HCL 4 MG PO TABS: 4.0000 mg | ORAL_TABLET | Freq: Four times a day (QID) | ORAL | Status: DC | PRN

## 2020-04-12 MED ORDER — DULOXETINE HCL 30 MG PO CPEP
30.0000 mg | ORAL_CAPSULE | Freq: Every day | ORAL | Status: DC
  Administered 2020-04-13 – 2020-04-14 (×2): 30 mg via ORAL
  Filled 2020-04-12 (×2): qty 1

## 2020-04-12 MED ORDER — PROPOFOL 10 MG/ML IV BOLUS
INTRAVENOUS | Status: DC | PRN
  Administered 2020-04-12: 120 mg via INTRAVENOUS

## 2020-04-12 MED ORDER — LACTATED RINGERS IV SOLN: INTRAVENOUS | Status: DC | PRN

## 2020-04-12 MED ORDER — ONDANSETRON HCL 4 MG/2ML IJ SOLN
INTRAMUSCULAR | Status: AC
  Filled 2020-04-12: qty 2

## 2020-04-12 MED ORDER — ONDANSETRON HCL 4 MG/2ML IJ SOLN: 4.0000 mg | Freq: Four times a day (QID) | INTRAMUSCULAR | Status: DC | PRN

## 2020-04-12 MED ORDER — PHENYLEPHRINE HCL-NACL 10-0.9 MG/250ML-% IV SOLN
INTRAVENOUS | Status: DC | PRN
  Administered 2020-04-12: 25 ug/min via INTRAVENOUS

## 2020-04-12 MED ORDER — THROMBIN 20000 UNITS EX SOLR
CUTANEOUS | Status: AC
  Filled 2020-04-12: qty 20000

## 2020-04-12 MED ORDER — LIDOCAINE IN D5W 4-5 MG/ML-% IV SOLN
INTRAVENOUS | Status: DC | PRN
  Administered 2020-04-12: 25 ug/kg/min via INTRAVENOUS

## 2020-04-12 MED ORDER — HYDROMORPHONE HCL 1 MG/ML IJ SOLN: 0.2500 mg | INTRAMUSCULAR | Status: DC | PRN

## 2020-04-12 MED ORDER — SUGAMMADEX SODIUM 200 MG/2ML IV SOLN
INTRAVENOUS | Status: DC | PRN
  Administered 2020-04-12: 120 mg via INTRAVENOUS

## 2020-04-12 MED ORDER — BUPIVACAINE HCL (PF) 0.25 % IJ SOLN
INTRAMUSCULAR | Status: DC | PRN
  Administered 2020-04-12: 5 mL

## 2020-04-12 MED ORDER — HYDROMORPHONE HCL 1 MG/ML IJ SOLN
0.5000 mg | INTRAMUSCULAR | Status: DC | PRN
  Administered 2020-04-12: 0.5 mg via INTRAVENOUS
  Filled 2020-04-12: qty 0.5

## 2020-04-12 MED ORDER — MIDAZOLAM HCL 5 MG/5ML IJ SOLN
INTRAMUSCULAR | Status: DC | PRN
  Administered 2020-04-12: 2 mg via INTRAVENOUS

## 2020-04-12 MED ORDER — EPHEDRINE SULFATE-NACL 50-0.9 MG/10ML-% IV SOSY
PREFILLED_SYRINGE | INTRAVENOUS | Status: DC | PRN
  Administered 2020-04-12: 10 mg via INTRAVENOUS

## 2020-04-12 MED ORDER — ONDANSETRON HCL 4 MG/2ML IJ SOLN
INTRAMUSCULAR | Status: DC | PRN
  Administered 2020-04-12: 4 mg via INTRAVENOUS

## 2020-04-12 MED ORDER — EPHEDRINE 5 MG/ML INJ
INTRAVENOUS | Status: AC
  Filled 2020-04-12: qty 10

## 2020-04-12 MED ORDER — BACID PO TABS
1.0000 | ORAL_TABLET | Freq: Every day | ORAL | Status: DC
  Administered 2020-04-12 – 2020-04-14 (×3): 1 via ORAL
  Filled 2020-04-12 (×3): qty 1

## 2020-04-12 MED ORDER — FENTANYL CITRATE (PF) 250 MCG/5ML IJ SOLN
INTRAMUSCULAR | Status: AC
  Filled 2020-04-12: qty 5

## 2020-04-12 MED ORDER — METHOCARBAMOL 500 MG PO TABS
ORAL_TABLET | ORAL | Status: AC
  Filled 2020-04-12: qty 1

## 2020-04-12 MED ORDER — ASCORBIC ACID 500 MG PO TABS
1000.0000 mg | ORAL_TABLET | Freq: Every day | ORAL | Status: DC
  Administered 2020-04-12 – 2020-04-14 (×3): 1000 mg via ORAL
  Filled 2020-04-12 (×3): qty 2

## 2020-04-12 MED ORDER — VITAMIN D 25 MCG (1000 UNIT) PO TABS
1000.0000 [IU] | ORAL_TABLET | Freq: Two times a day (BID) | ORAL | Status: DC
  Administered 2020-04-12 – 2020-04-14 (×4): 1000 [IU] via ORAL
  Filled 2020-04-12 (×5): qty 1

## 2020-04-12 MED ORDER — KETAMINE HCL 10 MG/ML IJ SOLN
INTRAMUSCULAR | Status: DC | PRN
  Administered 2020-04-12: 10 mg via INTRAVENOUS
  Administered 2020-04-12: 20 mg via INTRAVENOUS
  Administered 2020-04-12: 10 mg via INTRAVENOUS

## 2020-04-12 MED ORDER — ROCURONIUM BROMIDE 10 MG/ML (PF) SYRINGE
PREFILLED_SYRINGE | INTRAVENOUS | Status: DC | PRN
  Administered 2020-04-12: 20 mg via INTRAVENOUS
  Administered 2020-04-12: 40 mg via INTRAVENOUS
  Administered 2020-04-12: 50 mg via INTRAVENOUS

## 2020-04-12 MED ORDER — THROMBIN 5000 UNITS EX SOLR
CUTANEOUS | Status: AC
  Filled 2020-04-12: qty 5000

## 2020-04-12 MED ORDER — PHENYLEPHRINE 40 MCG/ML (10ML) SYRINGE FOR IV PUSH (FOR BLOOD PRESSURE SUPPORT)
PREFILLED_SYRINGE | INTRAVENOUS | Status: DC | PRN
  Administered 2020-04-12 (×6): 80 ug via INTRAVENOUS

## 2020-04-12 MED ORDER — THROMBIN 20000 UNITS EX SOLR
CUTANEOUS | Status: DC | PRN
  Administered 2020-04-12: 20 mL via TOPICAL

## 2020-04-12 MED ORDER — POLYVINYL ALCOHOL 1.4 % OP SOLN
1.0000 [drp] | OPHTHALMIC | Status: DC | PRN
  Filled 2020-04-12: qty 15

## 2020-04-12 MED ORDER — VANCOMYCIN HCL 1000 MG IV SOLR
INTRAVENOUS | Status: AC
  Filled 2020-04-12: qty 1000

## 2020-04-12 MED ORDER — METHOCARBAMOL 500 MG PO TABS
500.0000 mg | ORAL_TABLET | Freq: Four times a day (QID) | ORAL | Status: DC | PRN
  Administered 2020-04-12 – 2020-04-13 (×4): 500 mg via ORAL
  Filled 2020-04-12 (×3): qty 1

## 2020-04-12 MED ORDER — CEFAZOLIN SODIUM-DEXTROSE 2-4 GM/100ML-% IV SOLN
2.0000 g | INTRAVENOUS | Status: AC
  Administered 2020-04-12: 2 g via INTRAVENOUS
  Filled 2020-04-12: qty 100

## 2020-04-12 MED ORDER — ADULT MULTIVITAMIN W/MINERALS CH
1.0000 | ORAL_TABLET | ORAL | Status: DC
  Administered 2020-04-12 – 2020-04-14 (×2): 1 via ORAL
  Filled 2020-04-12 (×2): qty 1

## 2020-04-12 MED ORDER — LACTATED RINGERS IV SOLN: INTRAVENOUS | Status: DC

## 2020-04-12 MED ORDER — LEVOTHYROXINE SODIUM 75 MCG PO TABS
37.5000 ug | ORAL_TABLET | Freq: Every day | ORAL | Status: DC
  Administered 2020-04-13 – 2020-04-14 (×2): 37.5 ug via ORAL
  Filled 2020-04-12 (×2): qty 1

## 2020-04-12 MED ORDER — DOCUSATE SODIUM 100 MG PO CAPS
100.0000 mg | ORAL_CAPSULE | Freq: Two times a day (BID) | ORAL | Status: DC
  Administered 2020-04-12 – 2020-04-14 (×4): 100 mg via ORAL
  Filled 2020-04-12 (×4): qty 1

## 2020-04-12 MED ORDER — FERROUS BISGLYCINATE CHELATE 28 MG PO CAPS
25.0000 mg | ORAL_CAPSULE | Freq: Every day | ORAL | Status: DC
Start: 2020-04-12 — End: 2020-04-12

## 2020-04-12 MED ORDER — HYDROMORPHONE HCL 1 MG/ML IJ SOLN
0.2500 mg | INTRAMUSCULAR | Status: DC | PRN
  Administered 2020-04-12 (×3): 0.5 mg via INTRAVENOUS

## 2020-04-12 MED ORDER — CELECOXIB 200 MG PO CAPS
200.0000 mg | ORAL_CAPSULE | Freq: Two times a day (BID) | ORAL | Status: DC
  Administered 2020-04-12 – 2020-04-14 (×4): 200 mg via ORAL
  Filled 2020-04-12 (×4): qty 1

## 2020-04-12 MED ORDER — SODIUM CHLORIDE 0.9% FLUSH: 3.0000 mL | INTRAVENOUS | Status: DC | PRN

## 2020-04-12 MED ORDER — FLUTICASONE PROPIONATE 50 MCG/ACT NA SUSP
1.0000 | Freq: Every day | NASAL | Status: DC
  Filled 2020-04-12: qty 16

## 2020-04-12 MED ORDER — OXYCODONE HCL 5 MG PO TABS
10.0000 mg | ORAL_TABLET | ORAL | Status: DC | PRN
  Administered 2020-04-12: 10 mg via ORAL
  Administered 2020-04-13 – 2020-04-14 (×7): 5 mg via ORAL
  Administered 2020-04-14: 10 mg via ORAL
  Filled 2020-04-12 (×10): qty 2

## 2020-04-12 MED ORDER — PROPOFOL 10 MG/ML IV BOLUS
INTRAVENOUS | Status: AC
  Filled 2020-04-12: qty 20

## 2020-04-12 MED ORDER — OXYCODONE HCL 5 MG/5ML PO SOLN: 5.0000 mg | Freq: Once | ORAL | Status: AC | PRN

## 2020-04-12 MED ORDER — PHENYLEPHRINE HCL-NACL 10-0.9 MG/250ML-% IV SOLN: 0.0000 ug/min | INTRAVENOUS | Status: DC

## 2020-04-12 MED ORDER — KETAMINE HCL 50 MG/5ML IJ SOSY
PREFILLED_SYRINGE | INTRAMUSCULAR | Status: AC
  Filled 2020-04-12: qty 5

## 2020-04-12 MED ORDER — CARBOXYMETHYLCELLUL-GLYCERIN 0.5-0.9 % OP SOLN: 1.0000 [drp] | Freq: Three times a day (TID) | OPHTHALMIC | Status: DC | PRN

## 2020-04-12 MED ORDER — FENTANYL CITRATE (PF) 100 MCG/2ML IJ SOLN
INTRAMUSCULAR | Status: AC
  Filled 2020-04-12: qty 2

## 2020-04-12 MED ORDER — CEFAZOLIN SODIUM-DEXTROSE 2-4 GM/100ML-% IV SOLN
2.0000 g | Freq: Three times a day (TID) | INTRAVENOUS | Status: AC
  Administered 2020-04-12 – 2020-04-13 (×2): 2 g via INTRAVENOUS
  Filled 2020-04-12 (×2): qty 100

## 2020-04-12 MED ORDER — BUPIVACAINE HCL (PF) 0.25 % IJ SOLN
INTRAMUSCULAR | Status: AC
  Filled 2020-04-12: qty 30

## 2020-04-12 MED ORDER — POTASSIUM CHLORIDE CRYS ER 10 MEQ PO TBCR
5.0000 meq | EXTENDED_RELEASE_TABLET | Freq: Every day | ORAL | Status: DC
  Administered 2020-04-13 – 2020-04-14 (×2): 5 meq via ORAL
  Filled 2020-04-12 (×2): qty 1

## 2020-04-12 SURGICAL SUPPLY — 102 items
ANCHOR LUMBAR 25 MIS (Anchor) ×12 IMPLANT
APPLIER CLIP 11 MED OPEN (CLIP) ×4
BASKET BONE COLLECTION (BASKET) ×4 IMPLANT
BENZOIN TINCTURE PRP APPL 2/3 (GAUZE/BANDAGES/DRESSINGS) ×4 IMPLANT
BLADE CLIPPER SURG (BLADE) ×4 IMPLANT
BONE CANC CHIPS 40CC CAN1/2 (Bone Implant) ×4 IMPLANT
BUR BARREL STRAIGHT FLUTE 4.0 (BURR) ×4 IMPLANT
BUR CARBIDE MATCH 3.0 (BURR) ×4 IMPLANT
CANISTER SUCT 3000ML PPV (MISCELLANEOUS) ×8 IMPLANT
CHIPS CANC BONE 40CC CAN1/2 (Bone Implant) ×2 IMPLANT
CLIP APPLIE 11 MED OPEN (CLIP) ×2 IMPLANT
CLIP LIGATING EXTRA MED SLVR (CLIP) IMPLANT
CLIP LIGATING EXTRA SM BLUE (MISCELLANEOUS) IMPLANT
CLOSURE WOUND 1/2 X4 (GAUZE/BANDAGES/DRESSINGS) ×1
CNTNR URN SCR LID CUP LEK RST (MISCELLANEOUS) ×2 IMPLANT
CONT SPEC 4OZ STRL OR WHT (MISCELLANEOUS) ×2
COVER BACK TABLE 60X90IN (DRAPES) ×4 IMPLANT
COVER WAND RF STERILE (DRAPES) ×12 IMPLANT
DERMABOND ADVANCED (GAUZE/BANDAGES/DRESSINGS) ×4
DERMABOND ADVANCED .7 DNX12 (GAUZE/BANDAGES/DRESSINGS) ×4 IMPLANT
DIFFUSER DRILL AIR PNEUMATIC (MISCELLANEOUS) ×4 IMPLANT
DRAPE C-ARM 42X72 X-RAY (DRAPES) ×8 IMPLANT
DRAPE C-ARMOR (DRAPES) ×4 IMPLANT
DRAPE LAPAROTOMY 100X72X124 (DRAPES) ×8 IMPLANT
DRAPE SURG 17X23 STRL (DRAPES) ×4 IMPLANT
DRSG OPSITE POSTOP 4X6 (GAUZE/BANDAGES/DRESSINGS) ×4 IMPLANT
DURAPREP 26ML APPLICATOR (WOUND CARE) ×8 IMPLANT
ELECT BLADE 4.0 EZ CLEAN MEGAD (MISCELLANEOUS) ×8
ELECT REM PT RETURN 9FT ADLT (ELECTROSURGICAL) ×8
ELECTRODE BLDE 4.0 EZ CLN MEGD (MISCELLANEOUS) ×4 IMPLANT
ELECTRODE REM PT RTRN 9FT ADLT (ELECTROSURGICAL) ×4 IMPLANT
EVACUATOR 1/8 PVC DRAIN (DRAIN) IMPLANT
GAUZE 4X4 16PLY RFD (DISPOSABLE) IMPLANT
GAUZE SPONGE 4X4 12PLY STRL (GAUZE/BANDAGES/DRESSINGS) ×4 IMPLANT
GLOVE BIO SURGEON STRL SZ7 (GLOVE) ×4 IMPLANT
GLOVE BIO SURGEON STRL SZ7.5 (GLOVE) ×4 IMPLANT
GLOVE BIO SURGEON STRL SZ8 (GLOVE) ×16 IMPLANT
GLOVE BIOGEL PI IND STRL 7.0 (GLOVE) ×2 IMPLANT
GLOVE BIOGEL PI IND STRL 8 (GLOVE) ×2 IMPLANT
GLOVE BIOGEL PI INDICATOR 7.0 (GLOVE) ×2
GLOVE BIOGEL PI INDICATOR 8 (GLOVE) ×2
GLOVE SS BIOGEL STRL SZ 7.5 (GLOVE) ×2 IMPLANT
GLOVE SUPERSENSE BIOGEL SZ 7.5 (GLOVE) ×2
GOWN STRL REUS W/ TWL LRG LVL3 (GOWN DISPOSABLE) ×4 IMPLANT
GOWN STRL REUS W/ TWL XL LVL3 (GOWN DISPOSABLE) ×6 IMPLANT
GOWN STRL REUS W/TWL 2XL LVL3 (GOWN DISPOSABLE) ×4 IMPLANT
GOWN STRL REUS W/TWL LRG LVL3 (GOWN DISPOSABLE) ×4
GOWN STRL REUS W/TWL XL LVL3 (GOWN DISPOSABLE) ×6
GRAFT BONE PROTEIOS MED 2.5CC (Orthopedic Implant) ×4 IMPLANT
GRAFT TRINITY ELITE LGE HUMAN (Tissue) ×4 IMPLANT
HEMOSTAT POWDER KIT SURGIFOAM (HEMOSTASIS) ×4 IMPLANT
HEMOSTAT SNOW SURGICEL 2X4 (HEMOSTASIS) IMPLANT
INSERT FOGARTY 61MM (MISCELLANEOUS) IMPLANT
INSERT FOGARTY SM (MISCELLANEOUS) IMPLANT
KIT BASIN OR (CUSTOM PROCEDURE TRAY) ×8 IMPLANT
KIT TURNOVER KIT B (KITS) ×8 IMPLANT
LOOP VESSEL MAXI BLUE (MISCELLANEOUS) IMPLANT
LOOP VESSEL MINI RED (MISCELLANEOUS) IMPLANT
NEEDLE HYPO 25X1 1.5 SAFETY (NEEDLE) ×8 IMPLANT
NEEDLE SPNL 18GX3.5 QUINCKE PK (NEEDLE) ×4 IMPLANT
NS IRRIG 1000ML POUR BTL (IV SOLUTION) ×8 IMPLANT
PACK LAMINECTOMY NEURO (CUSTOM PROCEDURE TRAY) ×8 IMPLANT
PAD ARMBOARD 7.5X6 YLW CONV (MISCELLANEOUS) IMPLANT
ROD LORD LIPPED TI 5.5X60 (Rod) ×8 IMPLANT
SCREW CANC SHANK MOD 6.5X35 (Screw) ×8 IMPLANT
SCREW CORT SHANK MOD 6.5X40 (Screw) ×16 IMPLANT
SCREW POLYAXIAL TULIP (Screw) ×24 IMPLANT
SET SCREW (Screw) ×12 IMPLANT
SET SCREW SPNE (Screw) ×12 IMPLANT
SPACER HEDRON IA 26X34X15 15D (Spacer) ×4 IMPLANT
SPONGE INTESTINAL PEANUT (DISPOSABLE) ×8 IMPLANT
SPONGE LAP 18X18 RF (DISPOSABLE) ×4 IMPLANT
SPONGE LAP 4X18 RFD (DISPOSABLE) IMPLANT
SPONGE SURGIFOAM ABS GEL 100 (HEMOSTASIS) ×4 IMPLANT
STAPLER VISISTAT 35W (STAPLE) IMPLANT
STRIP CLOSURE SKIN 1/2X4 (GAUZE/BANDAGES/DRESSINGS) ×3 IMPLANT
SUT PDS AB 1 CTX 36 (SUTURE) ×4 IMPLANT
SUT PROLENE 4 0 RB 1 (SUTURE)
SUT PROLENE 4-0 RB1 .5 CRCL 36 (SUTURE) IMPLANT
SUT PROLENE 5 0 CC1 (SUTURE) IMPLANT
SUT PROLENE 6 0 C 1 30 (SUTURE) ×4 IMPLANT
SUT PROLENE 6 0 CC (SUTURE) IMPLANT
SUT SILK 0 TIES 10X30 (SUTURE) ×4 IMPLANT
SUT SILK 2 0 TIES 10X30 (SUTURE) ×4 IMPLANT
SUT SILK 2 0SH CR/8 30 (SUTURE) IMPLANT
SUT SILK 3 0 TIES 17X18 (SUTURE) ×2
SUT SILK 3 0SH CR/8 30 (SUTURE) IMPLANT
SUT SILK 3-0 18XBRD TIE BLK (SUTURE) ×2 IMPLANT
SUT VIC AB 0 CT1 18XCR BRD8 (SUTURE) ×2 IMPLANT
SUT VIC AB 0 CT1 27 (SUTURE) ×2
SUT VIC AB 0 CT1 27XBRD ANBCTR (SUTURE) ×2 IMPLANT
SUT VIC AB 0 CT1 8-18 (SUTURE) ×2
SUT VIC AB 2-0 CP2 18 (SUTURE) ×8 IMPLANT
SUT VIC AB 3-0 SH 27 (SUTURE) ×6
SUT VIC AB 3-0 SH 27X BRD (SUTURE) ×6 IMPLANT
SUT VIC AB 3-0 SH 8-18 (SUTURE) ×4 IMPLANT
SUT VICRYL 4-0 PS2 18IN ABS (SUTURE) ×4 IMPLANT
TOWEL GREEN STERILE (TOWEL DISPOSABLE) ×12 IMPLANT
TOWEL GREEN STERILE FF (TOWEL DISPOSABLE) ×16 IMPLANT
TRAY FOLEY MTR SLVR 14FR STAT (SET/KITS/TRAYS/PACK) ×4 IMPLANT
TRAY FOLEY MTR SLVR 16FR STAT (SET/KITS/TRAYS/PACK) IMPLANT
WATER STERILE IRR 1000ML POUR (IV SOLUTION) ×8 IMPLANT

## 2020-04-12 NOTE — Anesthesia Procedure Notes (Signed)
Procedure Name: Intubation Date/Time: 04/12/2020 8:03 AM Performed by: Renato Shin, CRNA Pre-anesthesia Checklist: Patient identified, Emergency Drugs available, Suction available and Patient being monitored Patient Re-evaluated:Patient Re-evaluated prior to induction Oxygen Delivery Method: Circle system utilized Preoxygenation: Pre-oxygenation with 100% oxygen Induction Type: IV induction Ventilation: Mask ventilation without difficulty Laryngoscope Size: Miller and 2 Grade View: Grade I Tube type: Oral Tube size: 7.0 mm Number of attempts: 1 Airway Equipment and Method: Stylet and Oral airway Placement Confirmation: ETT inserted through vocal cords under direct vision,  positive ETCO2 and breath sounds checked- equal and bilateral Secured at: 21 cm Tube secured with: Tape Dental Injury: Teeth and Oropharynx as per pre-operative assessment

## 2020-04-12 NOTE — Op Note (Signed)
Date: April 12, 2020  Preoperative diagnosis: Chronic lower back pain  Postoperative diagnosis: Same  Procedure: Anterior retroperitoneal spine exposure at the L4-L5 disc space for L4-L5 ALIF  Surgeon: Dr. [name], MD  Co-surgeon: Dr. Sherley Bounds, MD  Indications: Patient is a 69 year old female with chronic lower back pain including radiculopathy down the right leg.  She has had multiple previous interventions including L3-L4 laminectomy as well as L4-L5 and L5-S1 laminectomy.  She presents for planned L4-L5 ALIF after evaluation by Dr. Ronnald Ramp with neurosurgery.  Vascular surgery has been asked to assist with anterior spine exposure.  Risks and benefits have been discussed in detail with the patient.  Details: Left paramedian incision over the left rectus muscle to stay out of her previous transverse abdominoplasty incision.  Ultimately the left rectus muscle circumferentially mobilized and part of the posterior rectus sheath was taken down above the arcuate line.  The retroperitoneal space was entered lateral the peritoneum and left ureter was mobilized across midline.  There were a lot of adhesions to her previous inguinal hernia repair in the left groin that we left in place.  Her peritoneum was very thin.  Ultimately one iliolumbar vein was ligated between 2-0 silk ties and divided and one segmental branch was ligated between 2-0 silk ties and divided.  Ultimately after fully mobilizing the left iliac vein and left iliac artery, fixed retractors were placed at the L4-L5 disc space and we confirmed on lateral fluoroscopy that we were at the correct level.  Anesthesia: General  Details: Patient was taken to the operating room after informed consent was obtained.  Placed on the operative table in supine position.  General endotracheal anesthesia was induced.  Fluoroscopic C arm was used in the lateral position to identify the L4-L5 disc space that was then marked on the anterior  abdominal wall over the left rectus muscle.  Ultimately planned a left paramedian incision given her previous transverse abdominoplasty incision.  Abdominal wall was prepped draped usual sterile fashion.  Timeout was performed.  Paramedian incision was made over the left rectus muscle at our preoperative mark with a scalpel.  Dissected through the subcutaneous tissue with Bovie cautery until we encountered the anterior rectus sheath.  Cerebellum retractors were used for added visualization.  The anterior rectus sheath on the left was then opened longitudinally as well.  Hemostats were used to raise flaps underneath the anterior rectus sheath.  The left rectus muscle was circumferentially mobilized.  Entered the retroperitoneal space lateral to the rectus muscle and also took down peritoneum off the posterior rectus sheath bluntly with a Kd.  Part of the posterior rectus sheath was opened above the arcuate line with Metzenbaum scissors.  I did make a tear in her peritoneum given that this was very thin and frail.  Ultimately I was able to identify the peritoneum and mobilize this out of the retroperitoneum as well as the left ureter there was a fair amount of adhesions of the peritoneum down to the left inguinal hernia repair.  I elected to leave this alone given I thought I could still get enough mobility for L4-L5 exposure.  That point in time Dr. Ronnald Ramp used hand-held Wiley retractors to pull the peritoneum and left ureter across midline.  Identified the left iliac artery and this was bluntly mobilized with a Kd as well as the left iliac vein toward midline.  I did place a Balfour retractor with a lap pad for added visualization in the wound.  One iliolumbar vein was identified ligated with 2-0 silk ties and divided.  One segmental branch was identified ligated between 2-0 silk ties and divided.  Ultimately once we had fully mobilized across the disc space and I thought we had enough exposure a Thompson retractor  was placed on the field.  150 reverse lips were placed on each side of the vertebral body.  A 140 malleable was placed cranial.  Spinal needle was placed in the disc space and on lateral fluoroscopy confirmed we are at the correct level.  Case was turned over to Dr. Ronnald Ramp.  Please see his dictation for the remainder the case.  I did scrub back in at the end of the case and repaired several holes in the peritoneum with 3-0 Vicryl running.  The peritoneum was very thin and frail.    Complications: None  Condition: Stable  [name], MD [facility] Office: [place]

## 2020-04-12 NOTE — Anesthesia Postprocedure Evaluation (Signed)
Anesthesia Post Note  Patient: Courtney [name]  Procedure(s) Performed: Anterior Lumbar Interbody Fusion - Lumbar four-Lumbar five , posterior instrumented fusion Lumbar four-five lumbar three -four (N/A ) Posterior Instrumented Fusion Lumbar three to lumbar five (N/A Back) ABDOMINAL EXPOSURE (N/A )     Patient location during evaluation: PACU Anesthesia Type: General Level of consciousness: awake and alert Pain management: pain level controlled Vital Signs Assessment: post-procedure vital signs reviewed and stable Respiratory status: spontaneous breathing, nonlabored ventilation, respiratory function stable and patient connected to nasal cannula oxygen Cardiovascular status: blood pressure returned to baseline and stable Postop Assessment: no apparent nausea or vomiting Anesthetic complications: no   No complications documented.  Last Vitals:  Vitals:   04/12/20 1615 04/12/20 1630  BP: (!) 99/56 (!) 102/57  Pulse: 86 76  Resp: 18 15  Temp:  36.7 C  SpO2: 99% 98%    Last Pain:  Vitals:   04/12/20 1630  TempSrc:   PainSc: Asleep                 [name] S

## 2020-04-12 NOTE — Op Note (Signed)
04/12/2020  12:24 PM  PATIENT:  Courtney [name]  69 y.o. female  PRE-OPERATIVE DIAGNOSIS: Postlaminectomy spondylolisthesis L3-4 L4-5, back and leg pain  POST-OPERATIVE DIAGNOSIS:  same  PROCEDURE:  1.  Anterior lumbar interbody fusion L4-5 utilizing a titanium interbody cage packed with morselized allograft, 2.  Posterior lateral arthrodesis L3-4 L4-5 utilizing morselized allograft, 3.  Segmental fixation L3-L5 inclusive utilizing Alphatec cortical pedicle screws  SURGEON:  Sherley Bounds, MD  Co-surgeon: Dr. Fortunato Curling  ASSISTANTS: Juanda Bond, FNP  ANESTHESIA:   General  EBL: 50 ml  Total I/O In: 2000 [I.V.:1800; IV Piggyback:200] Out: 2458 [Urine:1000; Blood:50]  BLOOD ADMINISTERED: none  DRAINS: Medium Hemovac  SPECIMEN:  none  INDICATION FOR PROCEDURE: This patient presented with neck and leg pain after previous decompressive laminectomy L3-4 and L4-5 by another surgeon in the remote past. Imaging showed dynamic spondylolisthesis L3-4 and L4-5. The patient tried conservative measures without relief. Pain was debilitating. Recommended anterior lumbar interbody fusion L4-5 with posterior fusion with segmental fixation L3-L5. Patient understood the risks, benefits, and alternatives and potential outcomes and wished to proceed.  PROCEDURE DETAILS: The patient was taken to the operating room and after induction of adequate generalized endotracheal anesthesia she was placed in the supine position with operating table.  Her exposure was performed by Dr. Monica Martinez of vascular surgery and this will be described in a separate operative report  Once the exposure was completed we placed a needle in the disc base and marked the midline of the L4-5 disc and confirmed our level.  All in the room agreed.  I then incised the disc space and perform the initial discectomy with pituitary rongeurs after releasing the disc from the endplates with a Cobb elevator.  I then used curved  curettes to remove remainder disc from the endplates.  Used a high-speed drill to drill the endplates to prepare for arthrodesis.  I drilled down to the level of the posterior disc base and posterior longitudinal ligament.  This was done under lateral fluoroscopy.  I then used sequential trials into the 15 mm 15 degree medium size trial fit the best.  Therefore we packed a corresponding globus cage with morselized allograft, and inserted into the disc base utilizing lateral fluoroscopy and an inserter.  Once it was flush with the vertebral body we placed 1 anchor up into L4 and 2 down and L5.  We removed the inserter and locked our anchors into place.  We checked our final construct with AP and lateral fluoroscopy.  Dr. Carlis Abbott returned and we removed the retractor and found no bleeding but there were a couple of holes in the peritoneum which were repaired by Dr. Carlis Abbott.  We then closed the rectus fascia with a running 2-0 Prolene.  We closed the subcutaneous tissue with 2-0 Vicryl in the subcu particular tissue with 3-0 Vicryl.  The skin was closed with Dermabond.  We did check a final AP x-ray and the radiologist called about a small metallic object on the left just above the L5-S1 disc base which is exactly where we had placed a metal Hemoclip.  There was no retained sponge or instrument.  The patient was then moved over to the stretcher and then repositioned in the prone position on chest rolls and all pressure points were padded.  The lumbar region was cleaned with Betadine and then prepped with DuraPrep and then draped in the usual sterile fashion.  5 cc of local anesthesia was injected and a dorsal midline  incision was made through her old incision and carried down to the fascia was opened and the paraspinous musculature was taken down in a subperiosteal fashion to expose L3-4 and L4-5.  Intraoperative fluoroscopy confirmed our level and then we dissected out over the transverse processes of L3 and L4 and L5  bilaterally.  We removed scar tissue from the pars at each level and then localized our pedicle screw entry zone with AP and lateral fluoroscopy.  We marked this.  We then drilled into the cortex and then used a 6.5 tap/ awl and tapped in an upward and outward direction into the pedicle utilizing lateral fluoroscopy.  We then palpated with a ball probe to assure no break through the cortex.  In place 6.5 x 40 mm cortical pedicle screws at L3-L4 and L5 bilaterally.  We then decorticated the transverse processes and placed our mixture of morselized allograft to perform arthrodesis L3-L5 bilaterally.  We then placed our tulip heads on our cortical Shanks, placed lordotic rods into the tulip heads and locked these in position with locking caps and antitorque device.  We checked our final construct with AP and lateral fluoroscopy.  The left L3 screw seem to be a little low in the little lateral and therefore we checked with AP fluoroscopy using an owls eye view in the opposite view, and felt that our screw was fine.  It also palpated with a ball probe prior to placing the screw and felt confident that her screw was well placed.  For decided not to take the construct apart to recheck the screw.  We irrigated with saline solution containing bacitracin.  Not all bleeding points.  Placed a medium Hemovac drain through a separate stab incision.  Placed powdered vancomycin into the wound.  We then closed the fascia with interrupted 0 Vicryl.  We closed the subcutaneous tissues with interrupted 2-0 Vicryl and the subcuticular tissue with interrupted 3-0 Vicryl.  Skin was then closed with Dermabond and benzoin and Steri-Strips.  A sterile dressing was applied.  The patient was awakened from general anesthesia and transferred to the covering was stable condition.  At the end of the procedure all sponge needle and instrument counts were correct.   PLAN OF CARE: Admit to inpatient   PATIENT DISPOSITION:  PACU - hemodynamically  stable.   Delay start of Pharmacological VTE agent (>24hrs) due to surgical blood loss or risk of bleeding:  yes

## 2020-04-12 NOTE — H&P (Signed)
History and Physical Interval Note:  04/12/2020 7:31 AM  Courtney [name]  has presented today for surgery, with the diagnosis of post-laminectomy spondylolisthesis.  The various methods of treatment have been discussed with the patient and family. After consideration of risks, benefits and other options for treatment, the patient has consented to  Procedure(s): ALIF - L4-L5 with posterior instrumented fusion L4-5 (N/A) Posterior Instrumented Fusion L4-5 (N/A) ABDOMINAL EXPOSURE (N/A) as a surgical intervention.  The patient's history has been reviewed, patient examined, no change in status, stable for surgery.  I have reviewed the patient's chart and labs.  Questions were answered to the patient's satisfaction.    L4-L5 ALIF  Courtney [name]  Patient name: Courtney [name]        MRN: [id number]        DOB: 18-Jul-1950        Sex: female  REASON FOR CONSULT: Evaluate for L4-L5 ALIF  HPI: Courtney [name] is a 69 y.o. female with history of hyperlipidemia and bicuspid aortic valve that presents for preop evaluation of planned L4-L5 ALIF.  Patient describes at least 10 years of chronic lower back pain that has progressively gotten worse over time.  She is having radiculopathy in the right leg at this time.  She has had previous anterior cervical disc fusion as well as an L3-4 laminectomy in 2016 and an L4-5 L5-S1 laminectomy in 2019 and a right SI joint fusion 2020 by Dr. Rolena Infante.  She is now being seen by Dr. Ronnald Ramp.  He has recommended an L4-L5 ALIF.  Previous abdominal surgery includes bilateral open inguinal hernia repairs and an abdominoplasty.      Past Medical History:  Diagnosis Date  . ADHD    ADD  . Allergic to pets    pet dander  . Allergies   . Anemia   . Arthritis   . Asthma   . Bicuspid aortic valve    bicuspid AV with no stenosis or regurgitation 01/2015 echo   . Complication of anesthesia    slow to wake up and lingers for a long time  . Concussion    . Depression   . Family history of adverse reaction to anesthesia    mom had skin reaction to anesthesia but cant remember what it was or exactly what happened  . Fatigue   . Fibromyalgia   . GERD (gastroesophageal reflux disease)   . Hyperlipidemia   . Hypothyroidism   . Irregular periods   . Joint pain   . Joint stiffness   . Lower back pain   . Menopause   . Migraines   . Numbness and tingling   . Pollen allergy   . Ringing in ears   . Sleep apnea    does not wear CPAP  . Snoring          Past Surgical History:  Procedure Laterality Date  . ABDOMINOPLASTY    . ANTERIOR CERVICAL DECOMP/DISCECTOMY FUSION N/A 06/28/2016   Procedure: ACDF C5-7 ANTERIOR CERVICAL DECOMPRESSION/DISCECTOMY FUSION 2 LEVELS;  Surgeon: Melina Schools, MD;  Location: [facility];  Service: Orthopedics;  Laterality: N/A;  Requests 3 hrs  . COLONOSCOPY    . DENTAL SURGERY    . HERNIA REPAIR     times 2  . LUMBAR LAMINECTOMY/DECOMPRESSION MICRODISCECTOMY  04/21/2015   Procedure: DECOMPRESSION LUMBAR THREE-LUMBAR FOUR;  Surgeon: Melina Schools, MD;  Location: [facility];  Service: Orthopedics;;  . LUMBAR LAMINECTOMY/DECOMPRESSION MICRODISCECTOMY N/A 06/20/2017   Procedure: L4-5 decompression, L5-S1  left laminotomy/foraminotomy;  Surgeon: Melina Schools, MD;  Location: [facility];  Service: Orthopedics;  Laterality: N/A;  3 hrs  . PARATHYROIDECTOMY  2012  . SACROILIAC JOINT FUSION Right 07/24/2018   Procedure: SACROILIAC JOINT FUSION;  Surgeon: Melina Schools, MD;  Location: [facility];  Service: Orthopedics;  Laterality: Right;  90 mins  . SHOULDER ARTHROSCOPY W/ ACROMIAL REPAIR Right   . TONSILLECTOMY           Family History  Problem Relation Age of Onset  . COPD Mother   . Cancer Mother   . Hypertension Mother   . Hyperlipidemia Mother   . Heart disease Mother   . Thyroid disease Mother   . Thyroid disease Sister   . Thyroid disease Daughter     SOCIAL  HISTORY: Social History        Socioeconomic History  . Marital status: Widowed    Spouse name: Not on file  . Number of children: 2  . Years of education: Master's   . Highest education level: Not on file  Occupational History  . Occupation: Retired   Tobacco Use  . Smoking status: Former Research scientist (life sciences)  . Smokeless tobacco: Never Used  . Tobacco comment: social smoker stop at age 32  Vaping Use  . Vaping Use: Never used  Substance and Sexual Activity  . Alcohol use: Yes    Alcohol/week: 1.0 standard drink    Types: 1 Glasses of wine per week    Comment: socially  . Drug use: No  . Sexual activity: Never  Other Topics Concern  . Not on file  Social History Narrative   Lives alone   Caffeine use: 1 cup coffee/day   Social Determinants of Health      Financial Resource Strain:   . Difficulty of Paying Living Expenses: Not on file  Food Insecurity:   . Worried About Charity fundraiser in the Last Year: Not on file  . Ran Out of Food in the Last Year: Not on file  Transportation Needs:   . Lack of Transportation (Medical): Not on file  . Lack of Transportation (Non-Medical): Not on file  Physical Activity:   . Days of Exercise per Week: Not on file  . Minutes of Exercise per Session: Not on file  Stress:   . Feeling of Stress : Not on file  Social Connections:   . Frequency of Communication with Friends and Family: Not on file  . Frequency of Social Gatherings with Friends and Family: Not on file  . Attends Religious Services: Not on file  . Active Member of Clubs or Organizations: Not on file  . Attends Archivist Meetings: Not on file  . Marital Status: Not on file  Intimate Partner Violence:   . Fear of Current or Ex-Partner: Not on file  . Emotionally Abused: Not on file  . Physically Abused: Not on file  . Sexually Abused: Not on file         Allergies  Allergen Reactions  . Fish Allergy Anaphylaxis    Can tolerate shellfish.  Allergic to fish with shells and fins.  . Peanuts [Peanut Oil] Anaphylaxis    Can eat cashews, pistachios, and almonds    Current Outpatient Medications  Medication Sig Dispense Refill  . acetaminophen (TYLENOL) 500 MG tablet Take 500 mg by mouth daily.    . Artificial Tear Solution (GENTEAL TEARS OP) Place 1 drop into both eyes daily as needed (dry eyes).    Marland Kitchen  Ascorbic Acid (VITAMIN C) 1000 MG tablet Take 1,000 mg by mouth daily.     Marland Kitchen atorvastatin (LIPITOR) 10 MG tablet Take 10 mg by mouth every other day. In the evening    . Carboxymethylcellul-Glycerin (REFRESH OPTIVE OP) Place 1 drop into both eyes daily as needed (dry eyes).    . cholecalciferol (VITAMIN D3) 25 MCG (1000 UNIT) tablet Take 1,000 Units by mouth 2 (two) times daily.    . clobetasol ointment (TEMOVATE) 1.32 % Apply 1 application topically every 30 (thirty) days.    . Coenzyme Q10 300 MG CAPS Take 300 mg by mouth daily.     . diclofenac sodium (VOLTAREN) 1 % GEL Apply 1 application topically daily as needed (pain).     Marland Kitchen docusate sodium (COLACE) 100 MG capsule Take 100 mg by mouth 2 (two) times daily.    . DULoxetine (CYMBALTA) 30 MG capsule Take 30 mg by mouth daily after breakfast.     . EPINEPHrine (EPIPEN JR 2-PAK) 0.15 MG/0.3ML injection Inject 0.15 mg into the muscle as needed for anaphylaxis.    Marland Kitchen estradiol (ESTRACE) 0.1 MG/GM vaginal cream Place 0.1 g vaginally every 3 (three) days.     Lyndle Herrlich BISGLYCINATE CHELATE PO Take 25 mg by mouth daily.     . fluticasone (FLONASE) 50 MCG/ACT nasal spray Place 1 spray into both nostrils at bedtime.     . lactobacillus acidophilus (BACID) TABS tablet Take 1 tablet by mouth daily.    Marland Kitchen levothyroxine (SYNTHROID, LEVOTHROID) 25 MCG tablet Take 1 1/2 tablets daily (Patient taking differently: Take 37.5 mcg by mouth daily before breakfast. ) 130 tablet 2  . Lidocaine 4 % PTCH Apply 1 patch topically daily as needed (pain).     Marland Kitchen loratadine  (CLARITIN) 10 MG tablet Take 10 mg by mouth daily as needed (for seasonal allergies (Spring/Summer)).     Marland Kitchen MAGNESIUM CITRATE PO Take 1,000 mg by mouth 2 (two) times daily.     . meloxicam (MOBIC) 15 MG tablet Take 15 mg by mouth daily.    . Multiple Vitamins-Minerals (MULTIVITAMIN WITH MINERALS) tablet Take 1 tablet by mouth every other day.     . naproxen sodium (ALEVE) 220 MG tablet Take 220 mg by mouth daily as needed (pain).    . Omega-3 Fatty Acids (OMEGA 3 PO) Take 2,560 mg by mouth at bedtime.     . Oxycodone HCl 10 MG TABS Take 7.5 mg by mouth 3 (three) times daily.    . Potassium 99 MG TABS Take 99 mg by mouth 2 (two) times daily.     . traZODone (DESYREL) 100 MG tablet Take 33-50 mg by mouth at bedtime.     . urea (CARMOL) 40 % CREA Apply 1 application topically once a week.     . vitamin E 400 UNIT capsule Take 400 Units by mouth every 3 (three) days.     Marland Kitchen amoxicillin (AMOXIL) 500 MG capsule Take 2,000 mg by mouth See admin instructions. Take 2000 mg 1 hour prior to dental appt (Patient not taking: Reported on 04/06/2020)     No current facility-administered medications for this visit.    REVIEW OF SYSTEMS:  [X]  denotes positive finding, [ ]  denotes negative finding Cardiac  Comments:  Chest pain or chest pressure:    Shortness of breath upon exertion:    Short of breath when lying flat:    Irregular heart rhythm:        Vascular    Pain  in calf, thigh, or hip brought on by ambulation:    Pain in feet at night that wakes you up from your sleep:     Blood clot in your veins:    Leg swelling:         Pulmonary    Oxygen at home:    Productive cough:     Wheezing:         Neurologic    Sudden weakness in arms or legs:     Sudden numbness in arms or legs:     Sudden onset of difficulty speaking or slurred speech:    Temporary loss of vision in one eye:     Problems with dizziness:          Gastrointestinal    Blood in stool:     Vomited blood:         Genitourinary    Burning when urinating:     Blood in urine:        Psychiatric    Major depression:         Hematologic    Bleeding problems:    Problems with blood clotting too easily:        Skin    Rashes or ulcers:        Constitutional    Fever or chills:      PHYSICAL EXAM:    Vitals:   04/06/20 1129  BP: (!) 119/50  Pulse: 77  Resp: 16  Temp: 97.9 F (36.6 C)  TempSrc: Temporal  SpO2: 94%  Weight: 132 lb (59.9 kg)  Height: 5\' 4"  (1.626 m)    GENERAL: The patient is a well-nourished female, in no acute distress. The vital signs are documented above. CARDIAC: There is a regular rate and rhythm.  VASCULAR:  Palpable femoral pulses bilaterally Palpable DP pulses bilaterally PULMONARY: No respiratory distress. ABDOMEN: Soft and non-tender.  Open bilateral groin incisions from previous hernia repair.  Lower abdominoplasty incision. MUSCULOSKELETAL: There are no major deformities or cyanosis. NEUROLOGIC: No focal weakness or paresthesias are detected. SKIN: There are no ulcers or rashes noted. PSYCHIATRIC: The patient has a normal affect.  DATA:   Reviewed her MRI lumbar spine as well as a previous CTA and no significant iliac artery calcification.  MRI 10/20/2019 shows iliac vein bifurcation at L4 with a nice plane posteriorly  Assessment/Plan:  69 year old female presents for preop evaluation of L4-L5 ALIF in the setting of chronic lower back pain.  Discussed that given her previous abdominoplasty incision would likely be a paramedian incision over the left rectus muscle.  Discussed entering the retroperitoneal space lateral to the muscle and mobilizing the intestines as well as peritoneum and left ureter across midline.  Discussed mobilizing the left iliac artery and vein to expose L4-L5 disc space.  Talked about risk of injury to the  above structures.  After review of her MRI and her abdomen I think she would be a good candidate for anterior approach and look forward to assisting Dr. Ronnald Ramp.  Currently scheduled for Monday.   Courtney Heck, MD [facility] Office: [phone]

## 2020-04-12 NOTE — Transfer of Care (Signed)
Immediate Anesthesia Transfer of Care Note  Patient: NATASHA BURDA  Procedure(s) Performed: Anterior Lumbar Interbody Fusion - Lumbar four-Lumbar five , posterior instrumented fusion Lumbar four-five lumbar three -four (N/A ) Posterior Instrumented Fusion Lumbar three to lumbar five (N/A Back) ABDOMINAL EXPOSURE (N/A )  Patient Location: PACU  Anesthesia Type:General  Level of Consciousness: drowsy and patient cooperative  Airway & Oxygen Therapy: Patient Spontanous Breathing and Patient connected to face mask oxygen  Post-op Assessment: Report given to RN, Post -op Vital signs reviewed and stable, Patient moving all extremities X 4, Patient able to stick tongue midline and on Neo gtt  Post vital signs: Reviewed and stable  Last Vitals:  Vitals Value Taken Time  BP 76/40 04/12/20 1250  Temp    Pulse 63 04/12/20 1251  Resp 11 04/12/20 1251  SpO2 100 % 04/12/20 1251  Vitals shown include unvalidated device data.  Last Pain:  Vitals:   04/12/20 0604  TempSrc:   PainSc: 5       Patients Stated Pain Goal: 3 (04/12/27 1188)  Complications: No complications documented.

## 2020-04-12 NOTE — H&P (Signed)
Subjective: Patient is a 69 y.o. female admitted for spondylolisthesis. Onset of symptoms was several months ago, gradually worsening since that time.  The pain is rated severe, and is located at the across the lower back and radiates to RLE. The pain is described as aching and occurs all day. The symptoms have been progressive. Symptoms are exacerbated by exercise. MRI or CT showed post-lami spondylolisthesis L3-4 L4-5, dynamic at L4-5   Past Medical History:  Diagnosis Date  . ADHD    ADD  . Allergic to pets    pet dander  . Allergies   . Anemia   . Arthritis   . Asthma   . Bicuspid aortic valve    bicuspid AV with no stenosis or regurgitation 01/2015 echo   . Complication of anesthesia    slow to wake up and lingers for a long time  . Concussion   . Depression   . Family history of adverse reaction to anesthesia    mom had skin reaction to anesthesia but cant remember what it was or exactly what happened  . Fatigue   . Fibromyalgia   . GERD (gastroesophageal reflux disease)   . Hyperlipidemia   . Hypothyroidism   . Irregular periods   . Joint pain   . Joint stiffness   . Lower back pain   . Menopause   . Migraines   . Numbness and tingling   . Pollen allergy   . Ringing in ears   . Sleep apnea    does not wear CPAP  . Snoring     Past Surgical History:  Procedure Laterality Date  . ABDOMINOPLASTY    . ANTERIOR CERVICAL DECOMP/DISCECTOMY FUSION N/A 06/28/2016   Procedure: ACDF C5-7 ANTERIOR CERVICAL DECOMPRESSION/DISCECTOMY FUSION 2 LEVELS;  Surgeon: Melina Schools, MD;  Location: [place];  Service: Orthopedics;  Laterality: N/A;  Requests 3 hrs  . COLONOSCOPY    . DENTAL SURGERY    . HERNIA REPAIR     times 2  . LUMBAR LAMINECTOMY/DECOMPRESSION MICRODISCECTOMY  04/21/2015   Procedure: DECOMPRESSION LUMBAR THREE-LUMBAR FOUR;  Surgeon: Melina Schools, MD;  Location: [place];  Service: Orthopedics;;  . LUMBAR LAMINECTOMY/DECOMPRESSION MICRODISCECTOMY N/A 06/20/2017    Procedure: L4-5 decompression, L5-S1 left laminotomy/foraminotomy;  Surgeon: Melina Schools, MD;  Location: [place];  Service: Orthopedics;  Laterality: N/A;  3 hrs  . PARATHYROIDECTOMY  2012  . SACROILIAC JOINT FUSION Right 07/24/2018   Procedure: SACROILIAC JOINT FUSION;  Surgeon: Melina Schools, MD;  Location: [place];  Service: Orthopedics;  Laterality: Right;  90 mins  . SHOULDER ARTHROSCOPY W/ ACROMIAL REPAIR Right   . TONSILLECTOMY      Prior to Admission medications   Medication Sig Start Date End Date Taking? Authorizing Provider  acetaminophen (TYLENOL) 500 MG tablet Take 500 mg by mouth daily.   Yes [provider]  amoxicillin (AMOXIL) 500 MG capsule Take 2,000 mg by mouth See admin instructions. Take 2000 mg 1 hour prior to dental appt Patient not taking: Reported on 04/06/2020   Yes [provider]  Artificial Tear Solution (GENTEAL TEARS OP) Place 1 drop into both eyes daily as needed (dry eyes).   Yes [provider]  Ascorbic Acid (VITAMIN C) 1000 MG tablet Take 1,000 mg by mouth daily.    Yes [provider]  atorvastatin (LIPITOR) 10 MG tablet Take 10 mg by mouth every other day. In the evening   Yes [provider]  Carboxymethylcellul-Glycerin (REFRESH OPTIVE OP) Place 1 drop  into both eyes daily as needed (dry eyes).   Yes [provider]  cholecalciferol (VITAMIN D3) 25 MCG (1000 UNIT) tablet Take 1,000 Units by mouth 2 (two) times daily.   Yes [provider]  clobetasol ointment (TEMOVATE) 0.73 % Apply 1 application topically every 30 (thirty) days.   Yes [provider]  Coenzyme Q10 300 MG CAPS Take 300 mg by mouth daily.    Yes [provider]  diclofenac sodium (VOLTAREN) 1 % GEL Apply 1 application topically daily as needed (pain).    Yes [provider]  docusate sodium (COLACE) 100 MG capsule Take 100 mg by mouth 2 (two) times daily.   Yes [provider]  DULoxetine  (CYMBALTA) 30 MG capsule Take 30 mg by mouth daily after breakfast.  12/09/14  Yes [provider]  EPINEPHrine (EPIPEN JR 2-PAK) 0.15 MG/0.3ML injection Inject 0.15 mg into the muscle as needed for anaphylaxis.   Yes [provider]  estradiol (ESTRACE) 0.1 MG/GM vaginal cream Place 0.1 g vaginally every 3 (three) days.    Yes [provider]  FERROUS BISGLYCINATE CHELATE PO Take 25 mg by mouth daily.    Yes [provider]  fluticasone (FLONASE) 50 MCG/ACT nasal spray Place 1 spray into both nostrils at bedtime.    Yes [provider]  lactobacillus acidophilus (BACID) TABS tablet Take 1 tablet by mouth daily.   Yes [provider]  levothyroxine (SYNTHROID, LEVOTHROID) 25 MCG tablet Take 1 1/2 tablets daily Patient taking differently: Take 37.5 mcg by mouth daily before breakfast.  11/12/17  Yes Elayne Snare, MD  Lidocaine 4 % PTCH Apply 1 patch topically daily as needed (pain).    Yes [provider]  MAGNESIUM CITRATE PO Take 1,000 mg by mouth 2 (two) times daily.    Yes [provider]  meloxicam (MOBIC) 15 MG tablet Take 15 mg by mouth daily.   Yes [provider]  Multiple Vitamins-Minerals (MULTIVITAMIN WITH MINERALS) tablet Take 1 tablet by mouth every other day.  09/02/09  Yes [provider]  naproxen sodium (ALEVE) 220 MG tablet Take 220 mg by mouth daily as needed (pain).   Yes [provider]  Omega-3 Fatty Acids (OMEGA 3 PO) Take 2,560 mg by mouth at bedtime.    Yes [provider]  Oxycodone HCl 10 MG TABS Take 7.5 mg by mouth 3 (three) times daily.   Yes [provider]  Potassium 99 MG TABS Take 99 mg by mouth 2 (two) times daily.    Yes [provider]  traZODone (DESYREL) 100 MG tablet Take 33-50 mg by mouth at bedtime.    Yes [provider]  urea (CARMOL) 40 % CREA Apply 1 application topically once a week.  02/05/14  Yes [provider]   vitamin E 400 UNIT capsule Take 400 Units by mouth every 3 (three) days.    Yes [provider]  loratadine (CLARITIN) 10 MG tablet Take 10 mg by mouth daily as needed (for seasonal allergies (Spring/Summer)).     [provider]   Allergies  Allergen Reactions  . Fish Allergy Anaphylaxis    Can tolerate shellfish. Allergic to fish with shells and fins.  . Peanuts [Peanut Oil] Anaphylaxis    Can eat cashews, pistachios, and almonds    Social History   Tobacco Use  . Smoking status: Former Research scientist (life sciences)  . Smokeless tobacco: Never Used  . Tobacco comment: social smoker stop at age 80  Substance Use Topics  . Alcohol use: Yes    Alcohol/week: 1.0 standard drink    Types: 1 Glasses of wine per week    Comment: socially    Family History  Problem Relation Age of Onset  . COPD Mother   . Cancer Mother   . Hypertension Mother   . Hyperlipidemia Mother   . Heart disease Mother   . Thyroid disease Mother   . Thyroid disease Sister   . Thyroid disease Daughter      Review of Systems  Positive ROS: neg  All other systems have been reviewed and were otherwise negative with the exception of those mentioned in the HPI and as above.  Objective: Vital signs in last 24 hours: Temp:  [97.7 F (36.5 C)] 97.7 F (36.5 C) (11/01 0553) Pulse Rate:  [65] 65 (11/01 0553) Resp:  [18] 18 (11/01 0553) BP: (118)/(43) 118/43 (11/01 0553) SpO2:  [98 %] 98 % (11/01 0553) Weight:  [59.9 kg] 59.9 kg (11/01 0553)  General Appearance: Alert, cooperative, no distress, appears stated age Head: Normocephalic, without obvious abnormality, atraumatic Eyes: PERRL, conjunctiva/corneas clear, EOM's intact    Neck: Supple, symmetrical, trachea midline Back: Symmetric, no curvature, ROM normal, no CVA tenderness Lungs:  respirations unlabored Heart: Regular rate and rhythm Abdomen: Soft, non-tender Extremities: Extremities normal, atraumatic, no cyanosis or edema Pulses: 2+ and symmetric  all extremities Skin: Skin color, texture, turgor normal, no rashes or lesions  NEUROLOGIC:   Mental status: Alert and oriented x4,  no aphasia, good attention span, fund of knowledge, and memory Motor Exam - grossly normal Sensory Exam - grossly normal Reflexes: 1+ Coordination - grossly normal Gait - grossly normal Balance - grossly normal Cranial Nerves: I: smell Not tested  II: visual acuity  OS: nl    OD: nl  II: visual fields Full to confrontation  II: pupils Equal, round, reactive to light  III,VII: ptosis None  III,IV,VI: extraocular muscles  Full ROM  V: mastication Normal  V: facial light touch sensation  Normal  V,VII: corneal reflex  Present  VII: facial muscle function - upper  Normal  VII: facial muscle function - lower Normal  VIII: hearing Not tested  IX: soft palate elevation  Normal  IX,X: gag reflex Present  XI: trapezius strength  5/5  XI: sternocleidomastoid strength 5/5  XI: neck flexion strength  5/5  XII: tongue strength  Normal    Data Review Lab Results  Component Value Date   WBC 4.7 04/08/2020   HGB 12.1 04/08/2020   HCT 36.3 04/08/2020   MCV 97.1 04/08/2020   PLT 189 04/08/2020   Lab Results  Component Value Date   NA 139 04/08/2020   K 4.2 04/08/2020   CL 100 04/08/2020   CO2 28 04/08/2020   BUN 17 04/08/2020   CREATININE 0.76 04/08/2020   GLUCOSE 102 (H) 04/08/2020   Lab Results  Component Value Date   INR 1.0 04/08/2020    Assessment/Plan:  Estimated body mass index is 22.66 kg/m as calculated from the following:   Height as of this encounter: 5\' 4"  (1.626 m).   Weight as of this encounter: 59.9 kg. Patient admitted for ALIF /PLF L4-5, possibly extending the posterior fusion to L3-4. Patient has failed a reasonable attempt at conservative therapy.  I explained the condition and procedure to the patient and answered any questions.  Patient wishes to proceed with procedure as planned. Understands risks/ benefits and  typical outcomes of procedure.  Eustace Moore 04/12/2020 7:37 AM

## 2020-04-12 NOTE — Anesthesia Preprocedure Evaluation (Signed)
Anesthesia Evaluation  Patient identified by MRN, date of birth, ID band Patient awake    Reviewed: Allergy & Precautions, NPO status , Patient's Chart, lab work & pertinent test results  Airway Mallampati: II  TM Distance: >3 FB Neck ROM: Full    Dental no notable dental hx.    Pulmonary sleep apnea , former smoker,    Pulmonary exam normal breath sounds clear to auscultation       Cardiovascular negative cardio ROS Normal cardiovascular exam Rhythm:Regular Rate:Normal     Neuro/Psych negative neurological ROS  negative psych ROS   GI/Hepatic negative GI ROS, Neg liver ROS,   Endo/Other  Hypothyroidism   Renal/GU negative Renal ROS  negative genitourinary   Musculoskeletal  (+) Arthritis ,   Abdominal   Peds negative pediatric ROS (+)  Hematology negative hematology ROS (+)   Anesthesia Other Findings   Reproductive/Obstetrics negative OB ROS                             Anesthesia Physical Anesthesia Plan  ASA: II  Anesthesia Plan: General   Post-op Pain Management:    Induction: Intravenous  PONV Risk Score and Plan: 3 and Ondansetron, Dexamethasone and Treatment may vary due to age or medical condition  Airway Management Planned: Oral ETT  Additional Equipment:   Intra-op Plan:   Post-operative Plan: Extubation in OR  Informed Consent: I have reviewed the patients History and Physical, chart, labs and discussed the procedure including the risks, benefits and alternatives for the proposed anesthesia with the patient or authorized representative who has indicated his/her understanding and acceptance.     Dental advisory given  Plan Discussed with: CRNA and Surgeon  Anesthesia Plan Comments:         Anesthesia Quick Evaluation

## 2020-04-13 ENCOUNTER — Encounter (HOSPITAL_COMMUNITY): Payer: Self-pay | Admitting: Neurological Surgery

## 2020-04-13 NOTE — Progress Notes (Signed)
[facility]  Subjective  -passing gas.  Tolerating p.o.  Abdomen feels okay.   Objective (!) 104/46 83 98.7 F (37.1 C) (Oral) 18 97%  Intake/Output Summary (Last 24 hours) at 04/13/2020 0756 Last data filed at 04/13/2020 0532 Gross per 24 hour  Intake 3230 ml  Output 2000 ml  Net 1230 ml    Appropriate postop abdominal incisional tenderness. No rebound or guarding. Left DP palpable.  Laboratory Lab Results: Recent Labs    04/12/20 1513  WBC 13.3*  HGB 9.4*  HCT 28.3*  PLT 173   BMET No results for input(s): NA, K, CL, CO2, GLUCOSE, BUN, CREATININE, CALCIUM in the last 72 hours.  COAG Lab Results  Component Value Date   INR 1.0 04/08/2020   No results found for: PTT  Assessment/Planning: POD #1 status post L4-L5 ALIF.  Doing well this morning.  Palpable pedal pulse.  Appropriate postop incisional tenderness.  Tolerating p.o. and passing gas.  No concerns from vascular standpoint.  Please call if questions or concerns.  [name] 04/13/2020 7:56 AM --

## 2020-04-13 NOTE — Progress Notes (Signed)
Subjective: Patient reports moderate back soreness but doing ok  Objective: Vital signs in last 24 hours: Temp:  [98 F (36.7 C)-99.1 F (37.3 C)] 98.7 F (37.1 C) (11/02 0437) Pulse Rate:  [65-96] 83 (11/02 0437) Resp:  [9-22] 18 (11/02 0437) BP: (75-113)/(44-76) 104/46 (11/02 0437) SpO2:  [95 %-100 %] 97 % (11/02 0437)  Intake/Output from previous day: 11/01 0701 - 11/02 0700 In: 3230 [P.O.:480; I.V.:1800; IV Piggyback:450] Out: 2000 [Urine:1650; Drains:300; Blood:50] Intake/Output this shift: No intake/output data recorded.  Neurologic: Grossly normal  Lab Results: Lab Results  Component Value Date   WBC 13.3 (H) 04/12/2020   HGB 9.4 (L) 04/12/2020   HCT 28.3 (L) 04/12/2020   MCV 98.3 04/12/2020   PLT 173 04/12/2020   Lab Results  Component Value Date   INR 1.0 04/08/2020   BMET Lab Results  Component Value Date   NA 139 04/08/2020   K 4.2 04/08/2020   CL 100 04/08/2020   CO2 28 04/08/2020   GLUCOSE 102 (H) 04/08/2020   BUN 17 04/08/2020   CREATININE 0.76 04/08/2020   CALCIUM 9.6 04/08/2020    Studies/Results: DG Lumbar Spine 2-3 Views  Result Date: 04/12/2020 CLINICAL DATA:  Provided history: Surgery follow-up examination. Additional history provided: Anterior lumbar interbody fusion, lumbar 4-lumbar 5, posterior instrumented fusion lumbar 4-5 lumbar 3-4. Posterior instrumented fusion lumbar 3 2 lumbar 5. Provided fluoroscopy time 99.2 seconds (51.2 mGy). EXAM: LUMBAR SPINE - 2-3 VIEW; DG C-ARM 1-60 MIN COMPARISON:  Lumbar spine radiographs 11/20/2019. FINDINGS: PA and lateral view intraoperative fluoroscopic images of the lumbar spine are submitted, 2 images total. The images demonstrate anterior interbody fusion hardware at the L4-L5 level. Additionally, there is a posterior spinal fusion construct spanning the L3-L5 levels, utilizing bilateral pedicle screws and vertical interconnecting rods. Redemonstrated hardware within the sacrum. IMPRESSION: Two  intraoperative fluoroscopic images of the lumbar spine as described. Electronically Signed   By: Kellie Simmering DO   On: 04/12/2020 13:35   DG C-Arm 1-60 Min  Result Date: 04/12/2020 CLINICAL DATA:  Provided history: Surgery follow-up examination. Additional history provided: Anterior lumbar interbody fusion, lumbar 4-lumbar 5, posterior instrumented fusion lumbar 4-5 lumbar 3-4. Posterior instrumented fusion lumbar 3 2 lumbar 5. Provided fluoroscopy time 99.2 seconds (51.2 mGy). EXAM: LUMBAR SPINE - 2-3 VIEW; DG C-ARM 1-60 MIN COMPARISON:  Lumbar spine radiographs 11/20/2019. FINDINGS: PA and lateral view intraoperative fluoroscopic images of the lumbar spine are submitted, 2 images total. The images demonstrate anterior interbody fusion hardware at the L4-L5 level. Additionally, there is a posterior spinal fusion construct spanning the L3-L5 levels, utilizing bilateral pedicle screws and vertical interconnecting rods. Redemonstrated hardware within the sacrum. IMPRESSION: Two intraoperative fluoroscopic images of the lumbar spine as described. Electronically Signed   By: Kellie Simmering DO   On: 04/12/2020 13:35   DG OR LOCAL ABDOMEN  Result Date: 04/12/2020 CLINICAL DATA:  Lumbar spine surgery, a LIF EXAM: OR LOCAL ABDOMEN COMPARISON:  Pelvic radiograph 06/10/2018 FINDINGS: Plate and screws identified at L4-L5. Fixation devices traverse the RIGHT SI joint. Bones demineralized. Tiny rounded radiopaque foreign body 2 mm diameter projects over LEFT L5-S1 facet joint, uncertain etiology; this is not identified on a prior pelvic film from 2019. No other radiopaque foreign bodies. IMPRESSION: 2 mm rounded radiopaque foreign body projects over LEFT L5-S1 facet joint, uncertain etiology but new since 2019. Findings discussed with Dr. Ronnald Ramp on 04/12/2020 at 1015 hours. Electronically Signed   By: Lavonia Dana M.D.   On: 04/12/2020  10:16    Assessment/Plan: Continue therapies and pain management today. Will plan for  discharge tomorrow likely   LOS: 1 day    Ocie Cornfield [facility] 04/13/2020, 7:52 AM

## 2020-04-13 NOTE — Evaluation (Signed)
Occupational Therapy Evaluation Patient Details Name: Courtney [name] MRN: [id number] DOB: 1950/09/08 Today's Date: 04/13/2020    History of Present Illness Pt is a 69 yo female s/p ALIF L4-5. PMHx: ACDF, Lumbar sx.   Clinical Impression   Pt PTA: Pt living alone; spouse lives nearby. Spouse plans to stay x1-2 weeks in order to assist. Pt reports independent with ADL and mobility with SPC prior. Pt currently, miguardA overall for mobility. Pt modA with LB dressing and aware of AE and plans to trial LB dressing tomorrow. Pt supervisionA for UB ADL when standing at sink. Pt limited by pain and low BP this session 70/55. RN aware. Pt would benefit from continued OT skilled services for LB ADL and transfers in/out of shower. OT following acutely.     Follow Up Recommendations  Home health OT;Follow surgeon's recommendation for DC plan and follow-up therapies    Equipment Recommendations  None recommended by OT    Recommendations for Other Services       Precautions / Restrictions Precautions Precautions: Back Precaution Booklet Issued: Yes (comment) Precaution Comments: verbalized 2/3 back precautions; was able to recall at conclusion of session. Required Braces or Orthoses: Spinal Brace Restrictions Weight Bearing Restrictions: No      Mobility Bed Mobility Overal bed mobility: Needs Assistance Bed Mobility: Sit to Supine;Supine to Sit;Sit to Sidelying     Supine to sit: Min assist Sit to supine: Min assist Sit to sidelying: Supervision General bed mobility comments: MinA overall for trunk stability and movement    Transfers Overall transfer level: Needs assistance Equipment used: Rolling walker (2 wheeled) Transfers: Sit to/from [facility] Sit to Stand: Min guard Stand pivot transfers: Min guard;Supervision       General transfer comment: supervision to get on/off commode; minguardA from bed or recliner    Balance Overall balance assessment:  Mild deficits observed, not formally tested                                         ADL either performed or assessed with clinical judgement   ADL Overall ADL's : Needs assistance/impaired Eating/Feeding: Modified independent;Sitting   Grooming: Set up;Standing   Upper Body Bathing: Supervision/ safety;Standing   Lower Body Bathing: Minimal assistance;With adaptive equipment;Cueing for safety;Sitting/lateral leans;Sit to/from stand;Cueing for back precautions   Upper Body Dressing : Supervision/safety;Sitting;Standing   Lower Body Dressing: Minimal assistance;Sitting/lateral leans;Sit to/from stand;Cueing for safety;With adaptive equipment Lower Body Dressing Details (indicate cue type and reason): unable to perform figure 4 technique; requiring assist with  Toilet Transfer: Supervision/safety   Toileting- Clothing Manipulation and Hygiene: Supervision/safety   Tub/ Shower Transfer: Min guard   Functional mobility during ADLs: Min guard General ADL Comments: Pt unable to perform figure 4 technique and requires assist for LB ADL. Hip kit/AE introduced for LB ADL tasks and pt reported that she had the materials at home. Pt plans to use shower chair and have supervision for showers. Pt's spouse available as needed, but technically [place] the [place].     Vision Baseline Vision/History: Wears glasses Wears Glasses: At all times Patient Visual Report: No change from baseline Vision Assessment?: No apparent visual deficits     Perception     Praxis      Pertinent Vitals/Pain Pain Assessment: 0-10 Pain Score: 4  Pain Location: lower abdomen and low back Pain Descriptors / Indicators: Discomfort;Sore Pain  Intervention(s): Monitored during session;Premedicated before session;Repositioned     Hand Dominance Right   Extremity/Trunk Assessment Upper Extremity Assessment Upper Extremity Assessment: Generalized weakness   Lower Extremity  Assessment Lower Extremity Assessment: Generalized weakness   Cervical / Trunk Assessment Cervical / Trunk Assessment: Other exceptions Cervical / Trunk Exceptions: recent back sx   Communication Communication Communication: No difficulties   Cognition Arousal/Alertness: Awake/alert Behavior During Therapy: WFL for tasks assessed/performed Overall Cognitive Status: Within Functional Limits for tasks assessed                                     General Comments  no family present    Exercises     Shoulder Instructions      Home Living Family/patient expects to be discharged to:: Private residence Living Arrangements: Spouse/significant other Available Help at Discharge: Family;Available PRN/intermittently Type of Home: House Home Access: Stairs to enter [employer] of Steps: 3 in front b/l rails; 5 on side can reach both rails; 3 to deck can reach both Entrance Stairs-Rails: Can reach both Home Layout: One level;Laundry or work area in basement;Other (Comment) (has chair lift to attic and to basement)     Bathroom Shower/Tub: Tub/shower unit;Walk-in shower   Bathroom Toilet: Handicapped height     Home Equipment: Environmental consultant - 2 wheels;Adaptive equipment Adaptive Equipment: Reacher;Sock aid;Long-handled shoe horn        Prior Functioning/Environment Level of Independence: Independent with assistive device(s)                 OT Problem List: Decreased strength;Decreased activity tolerance;Impaired balance (sitting and/or standing);Decreased safety awareness;Pain;Decreased knowledge of use of DME or AE;Decreased knowledge of precautions      OT Treatment/Interventions: Self-care/ADL training;Therapeutic exercise;Energy conservation;DME and/or AE instruction;Therapeutic activities;Patient/family education;Balance training    OT Goals(Current goals can be found in the care plan section) Acute Rehab OT Goals Patient Stated Goal: to go  home OT Goal Formulation: With patient Time For Goal Achievement: 04/27/20 Potential to Achieve Goals: Good ADL Goals Pt Will Perform Lower Body Dressing: with modified independence;sitting/lateral leans;sit to/from stand;with adaptive equipment Pt Will Perform Tub/Shower Transfer: with supervision;ambulating;Shower transfer;tub bench  OT Frequency: Min 2X/week   Barriers to D/C:            Co-evaluation              AM-PAC OT "6 Clicks" Daily Activity     Outcome Measure Help from another person eating meals?: None Help from another person taking care of personal grooming?: None Help from another person toileting, which includes using toliet, bedpan, or urinal?: A Little Help from another person bathing (including washing, rinsing, drying)?: A Little Help from another person to put on and taking off regular upper body clothing?: None Help from another person to put on and taking off regular lower body clothing?: A Little 6 Click Score: 21   End of Session Equipment Utilized During Treatment: Gait belt;Back brace Nurse Communication: Mobility status  Activity Tolerance: Patient tolerated treatment well;Patient limited by pain Patient left: in bed;with call bell/phone within reach  OT Visit Diagnosis: Unsteadiness on feet (R26.81);Muscle weakness (generalized) (M62.81);Pain                Time: 4401-0272 OT Time Calculation (min): 39 min Charges:  OT General Charges $OT Visit: 1 Visit OT Evaluation $OT Eval Moderate Complexity: 1 Mod OT Treatments $Self Care/Home Management : 23-37 mins  Jefferey Pica, OTR/L Acute Rehabilitation Services Pager: [phone] Office: [phone]   Niquita Digioia C 04/13/2020, 11:24 AM

## 2020-04-13 NOTE — Evaluation (Addendum)
Physical Therapy Evaluation Patient Details Name: Courtney [name] MRN: [id number] DOB: 1951-05-14 Today's Date: 04/13/2020   History of Present Illness  Pt is a 69 yo female s/p ALIF L4-5. PMHx: ACDF, Lumbar sx.  Clinical Impression   Pt admitted with above diagnosis. Pt currently with functional limitations due to the deficits listed below (see PT Problem List). At the time of PT eval, pt was able to demonstrate transfers with gross min guard assist and no AD. Session limited by orthostatic hypotension and BP was closely monitored throughout session. See below for specific readings. We did not progress ambulation this session for safety. Anticipate as BP stabilizes pt will progress well with mobility. Pt will benefit from skilled PT to increase their independence and safety with mobility to allow discharge to the venue listed below.  Orthostatic BPs  Supine 90/36  Sitting (HOB 25) 87/43  Sitting after 3 min (HOB 25) 93/41  Standing EOB 90/41  Standing after 3 min 84/43         Follow Up Recommendations No PT follow up;Supervision - Intermittent    Equipment Recommendations  Rolling walker with 5" wheels;3in1 (PT)    Recommendations for Other Services       Precautions / Restrictions Precautions Precautions: Back Precaution Booklet Issued: Yes (comment) Precaution Comments: verbalized 2/3 back precautions; was able to recall at conclusion of session. Required Braces or Orthoses: Spinal Brace Restrictions Weight Bearing Restrictions: No      Mobility  Bed Mobility   Bed Mobility Overal bed mobility: Needs Assistance Bed Mobility: Rolling;Sidelying to Sit;Sit to Sidelying Rolling: Supervision Sidelying to sit: Min guard Sit to sidelying: Min guard Details: VC's for proper log roll technique. No assist required however close guard for safety provided due to dizziness.   Transfers Overall transfer level: Needs assistance Equipment used: None Transfers: Sit  to/from Stand Sit to Stand: Min guard General transfer comment: Pt demonstrated proper hand placement on seated surface for safety.   Ambulation/Gait General Gait Details: Not tested due to orthostatic hypotension  Stairs            Wheelchair Mobility    Modified Rankin (Stroke Patients Only)       Balance Overall balance assessment: Mild deficits observed, not formally tested                                           Pertinent Vitals/Pain Pain Assessment: Faces Faces Pain Scale: Hurts little more Pain Location: lower abdomen and low back Pain Descriptors / Indicators: Discomfort;Sore Pain Intervention(s): Limited activity within patient's tolerance;Monitored during session;Repositioned    Home Living Family/patient expects to be discharged to:: Private residence Living Arrangements: Spouse/significant other Available Help at Discharge: Family;Available PRN/intermittently Type of Home: House Home Access: Stairs to enter Entrance Stairs-Rails: Can reach both Entrance Stairs-Number of Steps: 3 in front b/l rails; 5 on side can reach both rails; 3 to deck can reach both Home Layout: One level;Laundry or work area in basement;Other (Comment) (has chair lift to attic and to basement) Home Equipment: Walker - 2 wheels;Adaptive equipment      Prior Function Level of Independence: Independent with assistive device(s)               Hand Dominance   Dominant Hand: Right    Extremity/Trunk Assessment   Upper Extremity Assessment Upper Extremity Assessment: Defer to OT evaluation  Lower Extremity Assessment Lower Extremity Assessment: Generalized weakness    Cervical / Trunk Assessment Cervical / Trunk Assessment: Other exceptions Cervical / Trunk Exceptions: s/p surgery  Communication   Communication: No difficulties  Cognition Arousal/Alertness: Awake/alert Behavior During Therapy: Flat affect Overall Cognitive Status: Within  Functional Limits for tasks assessed                                        General Comments      Exercises     Assessment/Plan    PT Assessment Patient needs continued PT services  PT Problem List Decreased strength;Decreased activity tolerance;Decreased balance;Decreased mobility;Decreased safety awareness;Decreased knowledge of use of DME;Decreased knowledge of precautions;Pain       PT Treatment Interventions DME instruction;Gait training;Functional mobility training;Therapeutic activities;Therapeutic exercise;Neuromuscular re-education;Patient/family education    PT Goals (Current goals can be found in the Care Plan section)  Acute Rehab PT Goals Patient Stated Goal: to go home PT Goal Formulation: With patient Time For Goal Achievement: 04/20/20 Potential to Achieve Goals: Good    Frequency Min 5X/week   Barriers to discharge        Co-evaluation               AM-PAC PT "6 Clicks" Mobility  Outcome Measure Help needed turning from your back to your side while in a flat bed without using bedrails?: None Help needed moving from lying on your back to sitting on the side of a flat bed without using bedrails?: A Little Help needed moving to and from a bed to a chair (including a wheelchair)?: A Little Help needed standing up from a chair using your arms (e.g., wheelchair or bedside chair)?: A Little Help needed to walk in hospital room?: A Little Help needed climbing 3-5 steps with a railing? : A Little 6 Click Score: 19    End of Session Equipment Utilized During Treatment: Gait belt;Back brace Activity Tolerance: Patient tolerated treatment well Patient left: in bed;with call bell/phone within reach;with family/visitor present Nurse Communication: Mobility status PT Visit Diagnosis: Unsteadiness on feet (R26.81);Pain Pain - part of body:  (Abdomen)    Time: 5681-2751 PT Time Calculation (min) (ACUTE ONLY): 29 min   Charges:   PT  Evaluation $PT Eval Low Complexity: 1 Low PT Treatments $Gait Training: 8-22 mins        Rolinda Roan, PT, DPT Acute Rehabilitation Services Pager: [phone] Office: [place] 04/13/2020, 3:40 PM

## 2020-04-14 MED ORDER — METHOCARBAMOL 500 MG PO TABS: 500.0000 mg | ORAL_TABLET | Freq: Three times a day (TID) | ORAL | 1 refills | Status: DC | PRN

## 2020-04-14 MED ORDER — OXYCODONE HCL 7.5 MG PO TABS: 7.5000 mg | ORAL_TABLET | ORAL | 0 refills | Status: DC | PRN

## 2020-04-14 MED FILL — Heparin Sodium (Porcine) Inj 1000 Unit/ML: INTRAMUSCULAR | Qty: 30 | Status: AC

## 2020-04-14 MED FILL — Sodium Chloride IV Soln 0.9%: INTRAVENOUS | Qty: 1000 | Status: AC

## 2020-04-14 NOTE — Progress Notes (Signed)
Physical Therapy Treatment Patient Details Name: Courtney [name] MRN: [id number] DOB: [birth year] Today's Date: 04/14/2020    History of Present Illness Pt is a 69 yo female s/p ALIF L4-5. PMHx: ACDF, Lumbar sx.    PT Comments    Pt progressing well with post-op mobility. She was able to demonstrate transfers and ambulation with gross min guard assist to supervision for safety and RW for support. Pt continues to be orthostatic (mildly symptomatic), with BP 80/33 prior to ambulation and 88/34 s/p ambulation. RN and MD aware. Pt was educated on precautions, brace application/wearing schedule, appropriate activity progression, and car transfer. Will continue to follow.      Follow Up Recommendations  No PT follow up;Supervision - Intermittent     Equipment Recommendations  Rolling walker with 5" wheels;3in1 (PT)    Recommendations for Other Services       Precautions / Restrictions Precautions Precautions: Back Precaution Booklet Issued: Yes (comment) Precaution Comments: verbalized 2/3 back precautions; was able to recall at conclusion of session. Required Braces or Orthoses: Spinal Brace Restrictions Weight Bearing Restrictions: No    Mobility  Bed Mobility Overal bed mobility: Needs Assistance Bed Mobility: Sit to Sidelying;Rolling;Sidelying to Sit Rolling: Min guard Sidelying to sit: Min assist     Sit to sidelying: Min assist General bed mobility comments: Pt sitting EOB when PT arrived  Transfers Overall transfer level: Needs assistance Equipment used: Rolling walker (2 wheeled) Transfers: Sit to/from Stand Sit to Stand: Supervision Stand pivot transfers: Supervision       General transfer comment: About to stand without UE support but when walker is in front of her she wants to pull up from it. VC's for hand placement on seated surface for safety.   Ambulation/Gait Ambulation/Gait assistance: Min guard Gait Distance (Feet): 200 Feet Assistive device:  Rolling walker (2 wheeled) Gait Pattern/deviations: Step-through pattern;Decreased stride length;Trunk flexed;Narrow base of support Gait velocity: Decreased Gait velocity interpretation: <1.31 ft/sec, indicative of household ambulator General Gait Details: Slow but generally steady with the RW. Reports mild dizziness but remained unchanged throughout gait training.    Stairs             Wheelchair Mobility    Modified Rankin (Stroke Patients Only)       Balance Overall balance assessment: Mild deficits observed, not formally tested                                          Cognition Arousal/Alertness: Awake/alert Behavior During Therapy: Flat affect Overall Cognitive Status: Within Functional Limits for tasks assessed                                        Exercises      General Comments        Pertinent Vitals/Pain Pain Assessment: Faces Faces Pain Scale: Hurts little more Pain Location: lower abdomen and low back Pain Descriptors / Indicators: Discomfort;Sore Pain Intervention(s): Limited activity within patient's tolerance;Monitored during session;Repositioned    Home Living                      Prior Function            PT Goals (current goals can now be found in the care plan section) Acute Rehab PT Goals Patient  Stated Goal: to go home PT Goal Formulation: With patient Time For Goal Achievement: 04/20/20 Potential to Achieve Goals: Good Progress towards PT goals: Progressing toward goals    Frequency    Min 5X/week      PT Plan Current plan remains appropriate    Co-evaluation              AM-PAC PT "6 Clicks" Mobility   Outcome Measure  Help needed turning from your back to your side while in a flat bed without using bedrails?: None Help needed moving from lying on your back to sitting on the side of a flat bed without using bedrails?: None Help needed moving to and from a bed to a  chair (including a wheelchair)?: None Help needed standing up from a chair using your arms (e.g., wheelchair or bedside chair)?: None Help needed to walk in hospital room?: None Help needed climbing 3-5 steps with a railing? : A Little 6 Click Score: 23    End of Session Equipment Utilized During Treatment: Gait belt;Back brace Activity Tolerance: Patient tolerated treatment well Patient left: in bed;with call bell/phone within reach;with family/visitor present Nurse Communication: Mobility status PT Visit Diagnosis: Unsteadiness on feet (R26.81);Pain Pain - part of body:  (Abdomen)     Time: 9379-0240 PT Time Calculation (min) (ACUTE ONLY): 26 min  Charges:  $Gait Training: 23-37 mins                     Rolinda Roan, PT, DPT Acute Rehabilitation Services Pager: [phone] Office: [phone]    Thelma Comp 04/14/2020, 12:25 PM

## 2020-04-14 NOTE — Progress Notes (Signed)
[facility]  Subjective  - no complaints.  Tolerating PO, no nausea, had BM x2.   Objective (!) 80/33 82 98.5 F (36.9 C) (Oral) 18 98%  Intake/Output Summary (Last 24 hours) at 04/14/2020 0841 Last data filed at 04/14/2020 0500 Gross per 24 hour  Intake 600 ml  Output 140 ml  Net 460 ml    Left DP palpable Abdominal brace on and working with PT  Laboratory Lab Results: Recent Labs    04/12/20 1513  WBC 13.3*  HGB 9.4*  HCT 28.3*  PLT 173   BMET No results for input(s): NA, K, CL, CO2, GLUCOSE, BUN, CREATININE, CALCIUM in the last 72 hours.  COAG Lab Results  Component Value Date   INR 1.0 04/08/2020   No results found for: PTT  Assessment/Planning: POD #2 s.p L4-L5 ALIF.  Continues to do well.  Tolerating PO and had bowel movement.  Palpable left DP.  Call vascular with questions or concerns.  [name] 04/14/2020 8:41 AM --

## 2020-04-14 NOTE — Plan of Care (Signed)
Pt and husband given D/C instructions with verbal understanding. Rx's were sent to the pharmacy by MD. Pt's incision is clean and dry with no sign of infection. Pt's IV and Hemovac were removed prior to D/C. Pt D/C'd home via wheelchair per MD order. Pt is stable @ D/C and has no other needs at this time. Barnes Florek, RN  

## 2020-04-14 NOTE — Progress Notes (Signed)
Occupational Therapy Treatment Patient Details Name: Courtney [name] MRN: [id number] DOB: 19-Jan-1951 Today's Date: 04/14/2020    History of present illness Pt is a 69 yo female s/p ALIF L4-5. PMHx: ACDF, Lumbar sx.   OT comments  OT treatment session with focus on patient education on LB bathing/dressing with use of AD, ADL transfers, and bed mobility. Simulated walk-in shower transfer with supervision A. Patient reports having a shower chair, grab bars, a hand held showerhead, and non-slip mat outside of shower. Patient in agreement with having family provide supervision A initially for shower transfers upon return home. Patient also able to demonstrate figure-4 position to doff/don footwear this date. Patient currently demonstrates supervision A for sit to stand transfers with use of RW, is able to verbalize 3/3 back precautions, and demonstrates good safety awareness. Recommendation updated to no follow-up OT with intermittent supervision/assist from family upon d/c.    Follow Up Recommendations  No OT follow up;Supervision - Intermittent    Equipment Recommendations  None recommended by OT    Recommendations for Other Services      Precautions / Restrictions Precautions Precautions: Back Precaution Booklet Issued: Yes (comment) Precaution Comments: verbalized 2/3 back precautions; was able to recall at conclusion of session. Required Braces or Orthoses: Spinal Brace Restrictions Weight Bearing Restrictions: No       Mobility Bed Mobility Overal bed mobility: Needs Assistance Bed Mobility: Sit to Sidelying;Rolling;Sidelying to Sit Rolling: Min guard Sidelying to sit: Min assist     Sit to sidelying: Min assist General bed mobility comments: Assist for trunk elevation to full sitting position as well as for LE elevation back up into bed at end of session.   Transfers Overall transfer level: Needs assistance Equipment used: Rolling walker (2 wheeled) Transfers: Sit  to/from [employer] Sit to Stand: Supervision Stand pivot transfers: Supervision            Balance Overall balance assessment: Mild deficits observed, not formally tested                                         ADL either performed or assessed with clinical judgement   ADL                       Lower Body Dressing: Min guard;Sitting/lateral leans;Sit to/from stand Lower Body Dressing Details (indicate cue type and reason): Patient able to attain and maintain figure-4 position to doff/don footwear seated EOB.          Tub/ Shower Transfer: Medical sales representative Arousal/Alertness: Awake/alert Behavior During Therapy: Flat affect Overall Cognitive Status: Within Functional Limits for tasks assessed                                          Exercises     Shoulder Instructions       General Comments      Pertinent Vitals/ Pain       Pain Assessment: Faces Faces Pain Scale: Hurts little more Pain Location: lower abdomen and low back Pain Descriptors / Indicators: Discomfort;Sore Pain Intervention(s): Limited activity within patient's tolerance;Monitored during session  Home Living                                          Prior Functioning/Environment              Frequency  Min 2X/week        Progress Toward Goals  OT Goals(current goals can now be found in the care plan section)  Progress towards OT goals: Progressing toward goals  Acute Rehab OT Goals Patient Stated Goal: to go home OT Goal Formulation: With patient Time For Goal Achievement: 04/27/20 Potential to Achieve Goals: Good ADL Goals Pt Will Perform Lower Body Dressing: with modified independence;sitting/lateral leans;sit to/from stand;with adaptive equipment Pt Will Perform Tub/Shower Transfer: with  supervision;ambulating;Shower transfer;tub bench  Plan Discharge plan remains appropriate    Co-evaluation                 AM-PAC OT "6 Clicks" Daily Activity     Outcome Measure   Help from another person eating meals?: None Help from another person taking care of personal grooming?: None Help from another person toileting, which includes using toliet, bedpan, or urinal?: A Little Help from another person bathing (including washing, rinsing, drying)?: A Little Help from another person to put on and taking off regular upper body clothing?: None Help from another person to put on and taking off regular lower body clothing?: A Little 6 Click Score: 21    End of Session Equipment Utilized During Treatment: Gait belt;Back brace  OT Visit Diagnosis: Unsteadiness on feet (R26.81);Muscle weakness (generalized) (M62.81);Pain Pain - part of body:  (back)   Activity Tolerance Patient tolerated treatment well;Patient limited by pain   Patient Left in bed;with call bell/phone within reach   Nurse Communication          Time: 8325-4982 OT Time Calculation (min): 23 min  Charges: OT General Charges $OT Visit: 1 Visit OT Treatments $Self Care/Home Management : 23-37 mins  Courtney [name] H. OTR/L Supplemental OT, Department of rehab services [phone]   Courtney [name] H. 04/14/2020, 10:55 AM

## 2020-04-14 NOTE — Progress Notes (Signed)
Physical Therapy Treatment Patient Details Name: Courtney [name] MRN: [id number] DOB: [birth year] Today's Date: 04/14/2020    History of Present Illness Pt is a 69 yo female s/p ALIF L4-5. PMHx: ACDF, Lumbar sx.    PT Comments    Pt seen for a second session per pt request for stair training. Due to hypotension this morning, we deferred stair training for safety. She  was able to demonstrate stair negotiation with min guard assist and single rail for support to simulate home environment. Pt anticipates d/c this afternoon and we reinforced education on precautions, brace application/wearing schedule, appropriate activity progression, and car transfer. Will continue to follow.      Follow Up Recommendations  No PT follow up;Supervision - Intermittent     Equipment Recommendations  Rolling walker with 5" wheels;3in1 (PT)    Recommendations for Other Services       Precautions / Restrictions Precautions Precautions: Back Precaution Booklet Issued: Yes (comment) Precaution Comments: verbalized 2/3 back precautions; was able to recall at conclusion of session. Required Braces or Orthoses: Spinal Brace Restrictions Weight Bearing Restrictions: No    Mobility  Bed Mobility Overal bed mobility: Needs Assistance Bed Mobility: Sit to Sidelying;Rolling;Sidelying to Sit Rolling: Min guard Sidelying to sit: Min assist     Sit to sidelying: Min assist General bed mobility comments: Pt sitting EOB when PT arrived  Transfers Overall transfer level: Needs assistance Equipment used: Rolling walker (2 wheeled) Transfers: Sit to/from Stand Sit to Stand: Supervision Stand pivot transfers: Supervision       General transfer comment: About to stand without UE support but when walker is in front of her she wants to pull up from it. VC's for hand placement on seated surface for safety.   Ambulation/Gait Ambulation/Gait assistance: Supervision Gait Distance (Feet): 250 Feet Assistive  device: Rolling walker (2 wheeled) Gait Pattern/deviations: Step-through pattern;Decreased stride length;Trunk flexed;Narrow base of support Gait velocity: Decreased Gait velocity interpretation: <1.8 ft/sec, indicate of risk for recurrent falls General Gait Details: Pt reports no dizziness this session with improved gait speed and no overt LOB.    Stairs Stairs: Yes Stairs assistance: Min guard Stair Management: One rail Left;Alternating pattern;Forwards Number of Stairs: 4 General stair comments: VC's for sequencing and general safety. No assist required however close guard provided for safety.    Wheelchair Mobility    Modified Rankin (Stroke Patients Only)       Balance Overall balance assessment: Mild deficits observed, not formally tested                                          Cognition Arousal/Alertness: Awake/alert Behavior During Therapy: Flat affect Overall Cognitive Status: Within Functional Limits for tasks assessed                                        Exercises      General Comments        Pertinent Vitals/Pain Pain Assessment: Faces Faces Pain Scale: Hurts little more Pain Location: lower abdomen and low back Pain Descriptors / Indicators: Discomfort;Sore Pain Intervention(s): Limited activity within patient's tolerance;Monitored during session;Repositioned    Home Living                      Prior Function  PT Goals (current goals can now be found in the care plan section) Acute Rehab PT Goals Patient Stated Goal: to go home PT Goal Formulation: With patient Time For Goal Achievement: 04/20/20 Potential to Achieve Goals: Good Progress towards PT goals: Progressing toward goals    Frequency    Min 5X/week      PT Plan Current plan remains appropriate    Co-evaluation              AM-PAC PT "6 Clicks" Mobility   Outcome Measure  Help needed turning from your back to  your side while in a flat bed without using bedrails?: None Help needed moving from lying on your back to sitting on the side of a flat bed without using bedrails?: None Help needed moving to and from a bed to a chair (including a wheelchair)?: None Help needed standing up from a chair using your arms (e.g., wheelchair or bedside chair)?: None Help needed to walk in hospital room?: None Help needed climbing 3-5 steps with a railing? : A Little 6 Click Score: 23    End of Session Equipment Utilized During Treatment: Gait belt;Back brace Activity Tolerance: Patient tolerated treatment well Patient left: in bed;with call bell/phone within reach;with family/visitor present Nurse Communication: Mobility status PT Visit Diagnosis: Unsteadiness on feet (R26.81);Pain Pain - part of body:  (Abdomen)     Time: 4010-2725 PT Time Calculation (min) (ACUTE ONLY): 11 min  Charges:  $Gait Training: 8-22 mins                     Rolinda Roan, PT, DPT Acute Rehabilitation Services Pager: [phone] Office: [phone]    Thelma Comp 04/14/2020, 12:31 PM

## 2020-04-14 NOTE — Discharge Summary (Signed)
Physician Discharge Summary  Patient ID: Courtney [name] MRN: [id number] DOB/AGE: April 09, 1951 69 y.o.  Admit date: 04/12/2020 Discharge date: 04/14/2020  Admission Diagnoses: Post laminectomy spondylolisthesis L3-4 L4-5    Discharge Diagnoses: Same status post lumbar fusion   Discharged Condition: good  Hospital Course: The patient was admitted on 04/12/2020 and taken to the operating room where the patient underwent lumbar fusion L3-L5. The patient tolerated the procedure well and was taken to the recovery room and then to the floor in stable condition. The hospital course was routine. There were no complications. The wound remained clean dry and intact. Pt had appropriate back soreness. No complaints of leg pain or new N/T/W. The patient remained afebrile with stable vital signs, and tolerated a regular diet. The patient continued to increase activities, and pain was well controlled with oral pain medications.   Consults: None  Significant Diagnostic Studies:  Results for orders placed or performed during the hospital encounter of 04/12/20  CBC  Result Value Ref Range   WBC 13.3 (H) 4.0 - 10.5 K/uL   RBC 2.88 (L) 3.87 - 5.11 MIL/uL   Hemoglobin 9.4 (L) 12.0 - 15.0 g/dL   HCT 28.3 (L) 36 - 46 %   MCV 98.3 80.0 - 100.0 fL   MCH 32.6 26.0 - 34.0 pg   MCHC 33.2 30.0 - 36.0 g/dL   RDW 12.0 11.5 - 15.5 %   Platelets 173 150 - 400 K/uL   nRBC 0.0 0.0 - 0.2 %    Chest 2 View  Result Date: 04/10/2020 CLINICAL DATA:  69 year old female with pending lumbar spine surgery EXAM: CHEST - 2 VIEW COMPARISON:  10/14/2019 FINDINGS: Cardiomediastinal silhouette within normal limits in size and contour. No pneumothorax. No pleural effusion. No confluent airspace disease. Coarsened interstitial markings. Surgical changes of the cervical region, new from the comparison plain film. No acute displaced fracture. IMPRESSION: Negative for acute cardiopulmonary disease Electronically Signed   By: Corrie Mckusick D.O.   On: 04/10/2020 11:58   DG Lumbar Spine 2-3 Views  Result Date: 04/12/2020 CLINICAL DATA:  Provided history: Surgery follow-up examination. Additional history provided: Anterior lumbar interbody fusion, lumbar 4-lumbar 5, posterior instrumented fusion lumbar 4-5 lumbar 3-4. Posterior instrumented fusion lumbar 3 2 lumbar 5. Provided fluoroscopy time 99.2 seconds (51.2 mGy). EXAM: LUMBAR SPINE - 2-3 VIEW; DG C-ARM 1-60 MIN COMPARISON:  Lumbar spine radiographs 11/20/2019. FINDINGS: PA and lateral view intraoperative fluoroscopic images of the lumbar spine are submitted, 2 images total. The images demonstrate anterior interbody fusion hardware at the L4-L5 level. Additionally, there is a posterior spinal fusion construct spanning the L3-L5 levels, utilizing bilateral pedicle screws and vertical interconnecting rods. Redemonstrated hardware within the sacrum. IMPRESSION: Two intraoperative fluoroscopic images of the lumbar spine as described. Electronically Signed   By: Kellie Simmering DO   On: 04/12/2020 13:35   DG C-Arm 1-60 Min  Result Date: 04/12/2020 CLINICAL DATA:  Provided history: Surgery follow-up examination. Additional history provided: Anterior lumbar interbody fusion, lumbar 4-lumbar 5, posterior instrumented fusion lumbar 4-5 lumbar 3-4. Posterior instrumented fusion lumbar 3 2 lumbar 5. Provided fluoroscopy time 99.2 seconds (51.2 mGy). EXAM: LUMBAR SPINE - 2-3 VIEW; DG C-ARM 1-60 MIN COMPARISON:  Lumbar spine radiographs 11/20/2019. FINDINGS: PA and lateral view intraoperative fluoroscopic images of the lumbar spine are submitted, 2 images total. The images demonstrate anterior interbody fusion hardware at the L4-L5 level. Additionally, there is a posterior spinal fusion construct spanning the L3-L5 levels, utilizing bilateral pedicle screws and  vertical interconnecting rods. Redemonstrated hardware within the sacrum. IMPRESSION: Two intraoperative fluoroscopic images of the lumbar  spine as described. Electronically Signed   By: Kellie Simmering DO   On: 04/12/2020 13:35   DG OR LOCAL ABDOMEN  Result Date: 04/12/2020 CLINICAL DATA:  Lumbar spine surgery, a LIF EXAM: OR LOCAL ABDOMEN COMPARISON:  Pelvic radiograph 06/10/2018 FINDINGS: Plate and screws identified at L4-L5. Fixation devices traverse the RIGHT SI joint. Bones demineralized. Tiny rounded radiopaque foreign body 2 mm diameter projects over LEFT L5-S1 facet joint, uncertain etiology; this is not identified on a prior pelvic film from 2019. No other radiopaque foreign bodies. IMPRESSION: 2 mm rounded radiopaque foreign body projects over LEFT L5-S1 facet joint, uncertain etiology but new since 2019. Findings discussed with Dr. Ronnald Ramp on 04/12/2020 at 1015 hours. Electronically Signed   By: Lavonia Dana M.D.   On: 04/12/2020 10:16    Antibiotics:  Anti-infectives (From admission, onward)   Start     Dose/Rate Route Frequency Ordered Stop   04/12/20 1830  ceFAZolin (ANCEF) IVPB 2g/100 mL premix        2 g 200 mL/hr over 30 Minutes Intravenous Every 8 hours 04/12/20 1732 04/13/20 0253   04/12/20 1206  vancomycin (VANCOCIN) powder  Status:  Discontinued          As needed 04/12/20 1207 04/12/20 1241   04/12/20 0600  ceFAZolin (ANCEF) IVPB 2g/100 mL premix        2 g 200 mL/hr over 30 Minutes Intravenous On call to O.R. 04/12/20 0550 04/12/20 0834      Discharge Exam: Blood pressure (!) 80/33, pulse 82, temperature 98.5 F (36.9 C), temperature source Oral, resp. rate 18, height 5\' 4"  (1.626 m), weight 59.9 kg, SpO2 98 %. Neurologic: Grossly normal Dressing is dry  Discharge Medications:   Allergies as of 04/14/2020      Reactions   Fish Allergy Anaphylaxis   Can tolerate shellfish. Allergic to fish with shells and fins.   Peanuts [peanut Oil] Anaphylaxis   Can eat cashews, pistachios, and almonds      Medication List    STOP taking these medications   amoxicillin 500 MG capsule Commonly known as: AMOXIL    naproxen sodium 220 MG tablet Commonly known as: ALEVE     TAKE these medications   acetaminophen 500 MG tablet Commonly known as: TYLENOL Take 500 mg by mouth daily.   atorvastatin 10 MG tablet Commonly known as: LIPITOR Take 10 mg by mouth every other day. In the evening   cholecalciferol 25 MCG (1000 UNIT) tablet Commonly known as: VITAMIN D3 Take 1,000 Units by mouth 2 (two) times daily.   clobetasol ointment 0.05 % Commonly known as: TEMOVATE Apply 1 application topically every 30 (thirty) days.   Coenzyme Q10 300 MG Caps Take 300 mg by mouth daily.   diclofenac sodium 1 % Gel Commonly known as: VOLTAREN Apply 1 application topically daily as needed (pain).   docusate sodium 100 MG capsule Commonly known as: COLACE Take 100 mg by mouth 2 (two) times daily.   DULoxetine 30 MG capsule Commonly known as: CYMBALTA Take 30 mg by mouth daily after breakfast.   EpiPen Jr 2-Pak 0.15 MG/0.3ML injection Generic drug: EPINEPHrine Inject 0.15 mg into the muscle as needed for anaphylaxis.   estradiol 0.1 MG/GM vaginal cream Commonly known as: ESTRACE Place 0.1 g vaginally every 3 (three) days.   FERROUS BISGLYCINATE CHELATE PO Take 25 mg by mouth daily.   fluticasone 50  MCG/ACT nasal spray Commonly known as: FLONASE Place 1 spray into both nostrils at bedtime.   GENTEAL TEARS OP Place 1 drop into both eyes daily as needed (dry eyes).   lactobacillus acidophilus Tabs tablet Take 1 tablet by mouth daily.   levothyroxine 25 MCG tablet Commonly known as: SYNTHROID Take 1 1/2 tablets daily What changed:   how much to take  how to take this  when to take this  additional instructions   Lidocaine 4 % Ptch Apply 1 patch topically daily as needed (pain).   loratadine 10 MG tablet Commonly known as: CLARITIN Take 10 mg by mouth daily as needed (for seasonal allergies (Spring/Summer)).   MAGNESIUM CITRATE PO Take 1,000 mg by mouth 2 (two) times daily.    meloxicam 15 MG tablet Commonly known as: MOBIC Take 15 mg by mouth daily.   methocarbamol 500 MG tablet Commonly known as: ROBAXIN Take 1 tablet (500 mg total) by mouth every 8 (eight) hours as needed for muscle spasms.   multivitamin with minerals tablet Take 1 tablet by mouth every other day.   OMEGA 3 PO Take 2,560 mg by mouth at bedtime.   oxyCODONE HCl 7.5 MG Tabs Take 7.5 mg by mouth every 4 (four) hours as needed for severe pain ((score 7 to 10)). What changed:   medication strength  when to take this  reasons to take this   Potassium 99 MG Tabs Take 99 mg by mouth 2 (two) times daily.   REFRESH OPTIVE OP Place 1 drop into both eyes daily as needed (dry eyes).   traZODone 100 MG tablet Commonly known as: DESYREL Take 33-50 mg by mouth at bedtime.   urea 40 % Crea Commonly known as: CARMOL Apply 1 application topically once a week.   vitamin C 1000 MG tablet Take 1,000 mg by mouth daily.   vitamin E 180 MG (400 UNITS) capsule Take 400 Units by mouth every 3 (three) days.            Durable Medical Equipment  (From admission, onward)         Start     Ordered   04/12/20 1732  DME Walker rolling  Once       Question:  Patient needs a walker to treat with the following condition  Answer:  S/P lumbar fusion   04/12/20 1732   04/12/20 1732  DME 3 n 1  Once        04/12/20 1732          Disposition: Home   Final Dx: ALIF L4-5, PLF with segmental instrumentation L3-5  Discharge Instructions    Call MD for:  difficulty breathing, headache or visual disturbances   Complete by: As directed    Call MD for:  persistant nausea and vomiting   Complete by: As directed    Call MD for:  redness, tenderness, or signs of infection (pain, swelling, redness, odor or green/yellow discharge around incision site)   Complete by: As directed    Call MD for:  severe uncontrolled pain   Complete by: As directed    Call MD for:  temperature >100.4   Complete  by: As directed    Diet - low sodium heart healthy   Complete by: As directed    Increase activity slowly   Complete by: As directed    Remove dressing in 48 hours   Complete by: As directed        Follow-up Information  Eustace Moore, MD. Schedule an appointment as soon as possible for a visit in 2 week(s).   Specialty: Neurosurgery Contact information: [address] [place] [address] [phone]                Signed: Eustace Moore 04/14/2020, 8:45 AM

## 2020-04-29 DIAGNOSIS — M13842 Other specified arthritis, left hand: Secondary | ICD-10-CM | POA: Diagnosis not present

## 2020-04-29 DIAGNOSIS — M25641 Stiffness of right hand, not elsewhere classified: Secondary | ICD-10-CM | POA: Diagnosis not present

## 2020-04-29 DIAGNOSIS — M13841 Other specified arthritis, right hand: Secondary | ICD-10-CM | POA: Diagnosis not present

## 2020-04-29 DIAGNOSIS — M79641 Pain in right hand: Secondary | ICD-10-CM | POA: Diagnosis not present

## 2020-04-29 DIAGNOSIS — Z4789 Encounter for other orthopedic aftercare: Secondary | ICD-10-CM | POA: Diagnosis not present

## 2020-05-13 DIAGNOSIS — M5459 Other low back pain: Secondary | ICD-10-CM | POA: Diagnosis not present

## 2020-05-18 ENCOUNTER — Other Ambulatory Visit: Payer: Self-pay | Admitting: Family Medicine

## 2020-05-18 DIAGNOSIS — Z1231 Encounter for screening mammogram for malignant neoplasm of breast: Secondary | ICD-10-CM

## 2020-05-19 DIAGNOSIS — Z9889 Other specified postprocedural states: Secondary | ICD-10-CM | POA: Diagnosis not present

## 2020-05-19 DIAGNOSIS — N3946 Mixed incontinence: Secondary | ICD-10-CM | POA: Diagnosis not present

## 2020-05-19 DIAGNOSIS — Z4689 Encounter for fitting and adjustment of other specified devices: Secondary | ICD-10-CM | POA: Diagnosis not present

## 2020-05-19 DIAGNOSIS — N812 Incomplete uterovaginal prolapse: Secondary | ICD-10-CM | POA: Diagnosis not present

## 2020-05-19 DIAGNOSIS — N952 Postmenopausal atrophic vaginitis: Secondary | ICD-10-CM | POA: Diagnosis not present

## 2020-05-19 DIAGNOSIS — L900 Lichen sclerosus et atrophicus: Secondary | ICD-10-CM | POA: Diagnosis not present

## 2020-05-19 DIAGNOSIS — N8111 Cystocele, midline: Secondary | ICD-10-CM | POA: Diagnosis not present

## 2020-05-27 DIAGNOSIS — M25551 Pain in right hip: Secondary | ICD-10-CM | POA: Diagnosis not present

## 2020-05-27 DIAGNOSIS — M4316 Spondylolisthesis, lumbar region: Secondary | ICD-10-CM | POA: Diagnosis not present

## 2020-05-27 DIAGNOSIS — R030 Elevated blood-pressure reading, without diagnosis of hypertension: Secondary | ICD-10-CM | POA: Diagnosis not present

## 2020-06-10 DIAGNOSIS — M1611 Unilateral primary osteoarthritis, right hip: Secondary | ICD-10-CM | POA: Diagnosis not present

## 2020-06-10 DIAGNOSIS — M7061 Trochanteric bursitis, right hip: Secondary | ICD-10-CM | POA: Diagnosis not present

## 2020-06-30 ENCOUNTER — Other Ambulatory Visit: Payer: Self-pay

## 2020-06-30 ENCOUNTER — Ambulatory Visit
Admission: RE | Admit: 2020-06-30 | Discharge: 2020-06-30 | Disposition: A | Discharge status: Home / Self Care | Payer: Medicare Other | Source: Ambulatory Visit | Attending: Family Medicine | Admitting: Family Medicine

## 2020-06-30 DIAGNOSIS — M25551 Pain in right hip: Secondary | ICD-10-CM | POA: Diagnosis not present

## 2020-06-30 DIAGNOSIS — Z1231 Encounter for screening mammogram for malignant neoplasm of breast: Secondary | ICD-10-CM

## 2020-07-01 DIAGNOSIS — M13842 Other specified arthritis, left hand: Secondary | ICD-10-CM | POA: Diagnosis not present

## 2020-07-01 DIAGNOSIS — M13841 Other specified arthritis, right hand: Secondary | ICD-10-CM | POA: Diagnosis not present

## 2020-07-29 DIAGNOSIS — M4316 Spondylolisthesis, lumbar region: Secondary | ICD-10-CM | POA: Diagnosis not present

## 2020-08-06 DIAGNOSIS — M1611 Unilateral primary osteoarthritis, right hip: Secondary | ICD-10-CM | POA: Diagnosis not present

## 2020-08-12 ENCOUNTER — Telehealth: Payer: Self-pay | Admitting: *Deleted

## 2020-08-12 NOTE — Telephone Encounter (Signed)
1st attempt to reach pt.  Call place to pt regarding surgical clearance and the need for an appointment.  Per pt's voicemail, gave permission to leave a detailed message, I left a message for pt to call and make an appointment in order for surgical clearance to be addressed.

## 2020-08-12 NOTE — Telephone Encounter (Signed)
   Primary Cardiologist: [name], MD  Chart reviewed as part of pre-operative protocol coverage. Because of Courtney [name]'s past medical history and time since last visit, she will require a follow-up visit in order to better assess preoperative cardiovascular risk.  Pre-op covering staff: - Please schedule appointment and call patient to inform them. If patient already had an upcoming appointment within acceptable timeframe, please add "pre-op clearance" to the appointment notes so provider is aware. - Please contact requesting surgeon's office via preferred method (i.e, phone, fax) to inform them of need for appointment prior to surgery.  If applicable, this message will also be routed to pharmacy pool and/or primary cardiologist for input on holding anticoagulant/antiplatelet agent as requested below so that this information is available to the clearing provider at time of patient's appointment.   [place], [place]  08/12/2020, 4:00 PM

## 2020-08-12 NOTE — Telephone Encounter (Signed)
   [facility] Pre-operative Risk Assessment    HEARTCARE STAFF: - Please ensure there is not already an duplicate clearance open for this procedure. - Under Visit Info/Reason for Call, type in Other and utilize the format Clearance MM/DD/YY or Clearance TBD. Do not use dashes or single digits. - If request is for dental extraction, please clarify the # of teeth to be extracted.  Request for surgical clearance:  1. What type of surgery is being performed? RIGHT TOTAL HIP ARTHROPLASTY-ANTERIOR   2. When is this surgery scheduled? 11/10/20   3. What type of clearance is required (medical clearance vs. Pharmacy clearance to hold med vs. Both)? MEDICAL  4. Are there any medications that need to be held prior to surgery and how long? NONE LISTED   5. Practice name and name of physician performing surgery? [facility]; DR. FRANK ALUISIO   6. What is the office phone number? [phone]   7.   What is the office fax number? [phone] ATTN: [place]  8.   Anesthesia type (None, local, MAC, general) ? CHOICE   Julaine Hua 08/12/2020, 1:38 PM  _________________________________________________________________   (provider comments below)

## 2020-08-13 NOTE — Telephone Encounter (Signed)
Left VM requesting she call to schedule appt 08/13/20 at 1:40PM.   Loel Dubonnet, NP

## 2020-08-13 NOTE — Telephone Encounter (Signed)
I left a detail message (DPR on File and vm says its okay) for the patient to return my call

## 2020-08-16 NOTE — Telephone Encounter (Signed)
Per pt's voicemail, gave permission to leave a detailed message, I left a message for pt to call and make an appointment in order for surgical clearance to be addressed.

## 2020-08-16 NOTE — Telephone Encounter (Signed)
Pt called in and stated that she would for heartcare not to call her anymore about the clearance appt.  She stated that she does not agree she needs to have one.  She is "pushing back " on [facility] about about this .  She made one in April just in case but she stated "she she was a big girl and would call us if she needed to make the appt.  There was no need to call her"

## 2020-08-30 ENCOUNTER — Other Ambulatory Visit: Payer: Self-pay

## 2020-08-30 ENCOUNTER — Ambulatory Visit (INDEPENDENT_AMBULATORY_CARE_PROVIDER_SITE_OTHER): Payer: Medicare Other | Admitting: Cardiovascular Disease

## 2020-08-30 ENCOUNTER — Encounter: Payer: Self-pay | Admitting: Cardiovascular Disease

## 2020-08-30 VITALS — BP 124/76 | HR 64 | Ht 64.0 in | Wt 136.8 lb

## 2020-08-30 DIAGNOSIS — Q231 Congenital insufficiency of aortic valve: Secondary | ICD-10-CM

## 2020-08-30 NOTE — Progress Notes (Signed)
Cardiology Office Note:    Date:  08/30/2020   ID:  Courtney [name], DOB 1950-12-22, MRN [id number]  PCP:  Kelton Pillar, MD  Cardiologist:   Electrophysiologist:  None   Referring MD: Kelton Pillar, MD   Chief Complaint  Patient presents with  . bicuspid aortic valve    Previous notes;   Courtney [name] is a 70 y.o. female with a hx of hyperlipidemia.  We were asked to see her by Dr.  Laurann Montana for further evaluation of her hyperlipidemia  And a heart murmur  Echo August, 2016  showed normal LV function  a bicuspid AV   Has had a heart murmur for years.  Discovered at age 44  Needs to have spine surgery - fairly extensive 2 day surgery  - she wanted to make sure she was ok for the surgery .    Has orthostasis when gardening  -  Occasional palps   No Cp, no dyspnea  Tries to walk but is limited by her back  Can do recumbant bike    August 30, 2020: Courtney [name] is seen today for follow up of her bicuspid AV. She is here for pre-op clearence for right total hip replacement witih Dr. Maureen Ralphs.  No CP, , no syncope  BP is sometimes low She has stopped her ASA      Past Medical History:  Diagnosis Date  . ADHD    ADD  . Allergic to pets    pet dander  . Allergies   . Anemia   . Arthritis   . Asthma   . Bicuspid aortic valve    bicuspid AV with no stenosis or regurgitation 01/2015 echo   . Complication of anesthesia    slow to wake up and lingers for a long time  . Concussion   . Depression   . Family history of adverse reaction to anesthesia    mom had skin reaction to anesthesia but cant remember what it was or exactly what happened  . Fatigue   . Fibromyalgia   . GERD (gastroesophageal reflux disease)   . Hyperlipidemia   . Hypothyroidism   . Irregular periods   . Joint pain   . Joint stiffness   . Lower back pain   . Menopause   . Migraines   . Numbness and tingling   . Pollen allergy   . Ringing in ears   . Sleep apnea    does not wear CPAP  .  Snoring     Past Surgical History:  Procedure Laterality Date  . ABDOMINAL EXPOSURE N/A 04/12/2020   Procedure: ABDOMINAL EXPOSURE;  Surgeon: [name], MD;  Location: [place];  Service: Vascular;  Laterality: N/A;  . ABDOMINOPLASTY    . ANTERIOR CERVICAL DECOMP/DISCECTOMY FUSION N/A 06/28/2016   Procedure: ACDF C5-7 ANTERIOR CERVICAL DECOMPRESSION/DISCECTOMY FUSION 2 LEVELS;  Surgeon: Melina Schools, MD;  Location: [place];  Service: Orthopedics;  Laterality: N/A;  Requests 3 hrs  . ANTERIOR LUMBAR FUSION N/A 04/12/2020   Procedure: Anterior Lumbar Interbody Fusion - Lumbar four-Lumbar five , posterior instrumented fusion Lumbar four-five lumbar three -four;  Surgeon: Eustace Moore, MD;  Location: [place];  Service: Neurosurgery;  Laterality: N/A;  . COLONOSCOPY    . DENTAL SURGERY    . HERNIA REPAIR     times 2  . LAMINECTOMY WITH POSTERIOR LATERAL ARTHRODESIS LEVEL 1 N/A 04/12/2020   Procedure: Posterior Instrumented Fusion Lumbar three to lumbar five;  Surgeon: Eustace Moore,  MD;  Location: [place];  Service: Neurosurgery;  Laterality: N/A;  . LUMBAR LAMINECTOMY/DECOMPRESSION MICRODISCECTOMY  04/21/2015   Procedure: DECOMPRESSION LUMBAR THREE-LUMBAR FOUR;  Surgeon: Melina Schools, MD;  Location: [place];  Service: Orthopedics;;  . LUMBAR LAMINECTOMY/DECOMPRESSION MICRODISCECTOMY N/A 06/20/2017   Procedure: L4-5 decompression, L5-S1 left laminotomy/foraminotomy;  Surgeon: Melina Schools, MD;  Location: [place];  Service: Orthopedics;  Laterality: N/A;  3 hrs  . PARATHYROIDECTOMY  2012  . SACROILIAC JOINT FUSION Right 07/24/2018   Procedure: SACROILIAC JOINT FUSION;  Surgeon: Melina Schools, MD;  Location: [place];  Service: Orthopedics;  Laterality: Right;  90 mins  . SHOULDER ARTHROSCOPY W/ ACROMIAL REPAIR Right   . TONSILLECTOMY      Current Medications: Current Meds  Medication Sig  . acetaminophen (TYLENOL) 500 MG tablet Take 500 mg by mouth daily.  . Artificial Tear Solution (GENTEAL  TEARS OP) Place 1 drop into both eyes daily as needed (dry eyes).  . Ascorbic Acid (VITAMIN C) 1000 MG tablet Take 1,000 mg by mouth daily.   Marland Kitchen aspirin 81 MG chewable tablet Chew 1 tablet by mouth as needed.  Marland Kitchen atorvastatin (LIPITOR) 10 MG tablet Take 10 mg by mouth every other day. In the evening  . Carboxymethylcellul-Glycerin (REFRESH OPTIVE OP) Place 1 drop into both eyes daily as needed (dry eyes).  . cholecalciferol (VITAMIN D3) 25 MCG (1000 UNIT) tablet Take 1,000 Units by mouth 2 (two) times daily.  . clobetasol ointment (TEMOVATE) 8.93 % Apply 1 application topically every 30 (thirty) days.  . Coenzyme Q10 300 MG CAPS Take 300 mg by mouth daily.   . diclofenac sodium (VOLTAREN) 1 % GEL Apply 1 application topically daily as needed (pain).   Marland Kitchen docusate sodium (COLACE) 100 MG capsule Take 100 mg by mouth 2 (two) times daily.  . DULoxetine (CYMBALTA) 30 MG capsule Take 30 mg by mouth daily after breakfast.   . EPINEPHrine (EPIPEN JR) 0.15 MG/0.3ML injection Inject 0.15 mg into the muscle as needed for anaphylaxis.  Marland Kitchen estradiol (ESTRACE) 0.1 MG/GM vaginal cream Place 0.1 g vaginally every 3 (three) days.   Lyndle Herrlich BISGLYCINATE CHELATE PO Take 25 mg by mouth daily.   . fluticasone (FLONASE) 50 MCG/ACT nasal spray Place 1 spray into both nostrils at bedtime.   . lactobacillus acidophilus (BACID) TABS tablet Take 1 tablet by mouth daily.  . Lactobacillus TABS Take 1 tablet by mouth daily.  Marland Kitchen levothyroxine (SYNTHROID, LEVOTHROID) 25 MCG tablet Take 1 1/2 tablets daily  . Lidocaine 4 % PTCH Apply 1 patch topically daily as needed (pain).   Marland Kitchen loratadine (CLARITIN) 10 MG tablet Take 10 mg by mouth daily as needed (for seasonal allergies (Spring/Summer)).   Marland Kitchen MAGNESIUM CITRATE PO Take 1,000 mg by mouth 2 (two) times daily.   . meloxicam (MOBIC) 15 MG tablet Take 15 mg by mouth daily.  . methocarbamol (ROBAXIN) 500 MG tablet Take 1 tablet (500 mg total) by mouth every 8 (eight) hours as needed  for muscle spasms.  . Multiple Vitamins-Minerals (MULTIVITAMIN WITH MINERALS) tablet Take 1 tablet by mouth every other day.   . naproxen (NAPROSYN) 250 MG tablet Take 1 tablet by mouth 2 (two) times daily.  . Omega-3 Fatty Acids (OMEGA 3 PO) Take 2,560 mg by mouth at bedtime.   . Oxycodone HCl 10 MG TABS Take 10 mg by mouth 4 (four) times daily as needed. for pain  . Oyster Shell Calcium 500 MG TABS Take 2 tablets by mouth daily.  Marland Kitchen  Potassium 99 MG TABS Take 99 mg by mouth 2 (two) times daily.   . Psyllium Husk POWD Take 1 capsule by mouth 2 (two) times daily.  . traZODone (DESYREL) 100 MG tablet Take 33-50 mg by mouth at bedtime.  . urea (CARMOL) 40 % CREA Apply 1 application topically once a week.   . vitamin E 400 UNIT capsule Take 400 Units by mouth every 3 (three) days.   . [DISCONTINUED] oxyCODONE 7.5 MG TABS Take 7.5 mg by mouth every 4 (four) hours as needed for severe pain ((score 7 to 10)).     Allergies:   Fish allergy and Peanuts [peanut oil]   Social History   Socioeconomic History  . Marital status: Widowed    Spouse name: Not on file  . Number of children: 2  . Years of education: Master's   . Highest education level: Not on file  Occupational History  . Occupation: Retired   Tobacco Use  . Smoking status: Former Research scientist (life sciences)  . Smokeless tobacco: Never Used  . Tobacco comment: social smoker stop at age 69  Vaping Use  . Vaping Use: Never used  Substance and Sexual Activity  . Alcohol use: Yes    Alcohol/week: 1.0 standard drink    Types: 1 Glasses of wine per week    Comment: socially  . Drug use: No  . Sexual activity: Never  Other Topics Concern  . Not on file  Social History Narrative   Lives alone   Caffeine use: 1 cup coffee/day   Social Determinants of Health   Financial Resource Strain: Not on file  Food Insecurity: Not on file  Transportation Needs: Not on file  Physical Activity: Not on file  Stress: Not on file  Social Connections: Not on file      Family History: The patient's family history includes COPD in her mother; Cancer in her mother; Heart disease in her mother; Hyperlipidemia in her mother; Hypertension in her mother; Thyroid disease in her daughter, mother, and sister.  Maternal grandfather had MI.  Strokes    ROS:   Please see the history of present illness.     All other systems reviewed and are negative.  EKGs/Labs/Other Studies Reviewed:    The following studies were reviewed today:     Recent Labs: 04/08/2020: BUN 17; Creatinine, Ser 0.76; Potassium 4.2; Sodium 139 04/12/2020: Hemoglobin 9.4; Platelets 173  Recent Lipid Panel No results found for: CHOL, TRIG, HDL, CHOLHDL, VLDL, LDLCALC, LDLDIRECT  Physical Exam:    Physical Exam: Blood pressure 124/76, pulse 64, height 5\' 4"  (1.626 m), weight 136 lb 12.8 oz (62.1 kg), SpO2 95 %.  GEN:  Well nourished, well developed in no acute distress HEENT: Normal NECK: No JVD; No carotid bruits LYMPHATICS: No lymphadenopathy CARDIAC: RRR , no murmurs, rubs, gallops RESPIRATORY:  Clear to auscultation without rales, wheezing or rhonchi  ABDOMEN: Soft, non-tender, non-distended MUSCULOSKELETAL:  No edema; No deformity  SKIN: Warm and dry NEUROLOGIC:  Alert and oriented x 3  EKG:    August 30, 2020:  NSR at 73.   No ST or T wave changes. .    ASSESSMENT:    1. Bicuspid aortic valve    PLAN:      Bicuspid aortic valve. Her valve sounds great. I suspect she has a partial fusion of 2 leaflts and not a true bicupsid AV No significant AS or AI  She is at low risk for her upcoming hip replacement  I will see  her in 1 year for follow up visit  .   Medication Adjustments/Labs and Tests Ordered: Current medicines are reviewed at length with the patient today.  Concerns regarding medicines are outlined above.  Orders Placed This Encounter  Procedures  . EKG 12-Lead   No orders of the defined types were placed in this encounter.   Patient  Instructions  Medication Instructions:  Your physician recommends that you continue on your current medications as directed. Please refer to the Current Medication list given to you today.  *If you need a refill on your cardiac medications before your next appointment, please call your pharmacy*   Lab Work: none If you have labs (blood work) drawn today and your tests are completely normal, you will receive your results only by: Marland Kitchen MyChart Message (if you have MyChart) OR . A paper copy in the mail If you have any lab test that is abnormal or we need to change your treatment, we will call you to review the results.   Testing/Procedures: none   Follow-Up: At [facility], you and your health needs are our priority.  As part of our continuing mission to provide you with exceptional heart care, we have created designated Provider Care Teams.  These Care Teams include your primary Cardiologist (physician) and Advanced Practice Providers (APPs -  Physician Assistants and Nurse Practitioners) who all work together to provide you with the care you need, when you need it.   Your next appointment:   1 year(s)  The format for your next appointment:   In Person  Provider:   You may see [name], MD or one of the following Advanced Practice Providers on your designated Care Team:    Richardson Dopp, PA-C  Robbie Lis, [place]      Signed, [name], MD  08/30/2020 4:51 PM    [place]

## 2020-08-30 NOTE — Patient Instructions (Signed)

## 2020-08-30 NOTE — Telephone Encounter (Signed)
   Primary Cardiologist: [name], MD  Chart reviewed as part of pre-operative protocol coverage.   Per Dr. Acie Fredrickson, "Courtney [name] is at low risk for her upcoming hip replacement. She has already stopped her ASA and does not need to restart from a cardiac standpoint. I will see her in 1 year."  I will route this message and his office note to requesting surgeon via [place] fax function. Please call with questions.    Charlie Pitter, PA-C 08/30/2020, 4:57 PM

## 2020-08-30 NOTE — Telephone Encounter (Signed)
Courtney [name] is at low risk for her upcoming hip replacement. She has already stopped her ASA and does not need to restart from a cardiac standpoint.  I will see her in 1 year.    [name], MD  08/30/2020 4:53 PM    [facility] [address],  [address], [place]  [address] Phone: [phone]; Fax: [phone]

## 2020-08-31 DIAGNOSIS — J309 Allergic rhinitis, unspecified: Secondary | ICD-10-CM | POA: Diagnosis not present

## 2020-08-31 DIAGNOSIS — E039 Hypothyroidism, unspecified: Secondary | ICD-10-CM | POA: Diagnosis not present

## 2020-08-31 DIAGNOSIS — E7800 Pure hypercholesterolemia, unspecified: Secondary | ICD-10-CM | POA: Diagnosis not present

## 2020-08-31 DIAGNOSIS — Z0000 Encounter for general adult medical examination without abnormal findings: Secondary | ICD-10-CM | POA: Diagnosis not present

## 2020-08-31 DIAGNOSIS — Z01818 Encounter for other preprocedural examination: Secondary | ICD-10-CM | POA: Diagnosis not present

## 2020-08-31 DIAGNOSIS — M2550 Pain in unspecified joint: Secondary | ICD-10-CM | POA: Diagnosis not present

## 2020-08-31 DIAGNOSIS — M1611 Unilateral primary osteoarthritis, right hip: Secondary | ICD-10-CM | POA: Diagnosis not present

## 2020-08-31 DIAGNOSIS — R7303 Prediabetes: Secondary | ICD-10-CM | POA: Diagnosis not present

## 2020-08-31 DIAGNOSIS — I700 Atherosclerosis of aorta: Secondary | ICD-10-CM | POA: Diagnosis not present

## 2020-08-31 DIAGNOSIS — G4733 Obstructive sleep apnea (adult) (pediatric): Secondary | ICD-10-CM | POA: Diagnosis not present

## 2020-08-31 DIAGNOSIS — G629 Polyneuropathy, unspecified: Secondary | ICD-10-CM | POA: Diagnosis not present

## 2020-09-15 DIAGNOSIS — M5416 Radiculopathy, lumbar region: Secondary | ICD-10-CM | POA: Diagnosis not present

## 2020-09-22 ENCOUNTER — Ambulatory Visit: Payer: Medicare Other | Admitting: Cardiovascular Disease

## 2020-09-30 DIAGNOSIS — M5416 Radiculopathy, lumbar region: Secondary | ICD-10-CM | POA: Diagnosis not present

## 2020-10-19 DIAGNOSIS — Z20822 Contact with and (suspected) exposure to covid-19: Secondary | ICD-10-CM | POA: Diagnosis not present

## 2020-10-27 NOTE — Patient Instructions (Addendum)
DUE TO COVID-19 ONLY ONE VISITOR IS ALLOWED TO COME WITH YOU AND STAY IN THE WAITING ROOM ONLY DURING PRE OP AND PROCEDURE DAY OF SURGERY. THE 2 VISITORS  MAY VISIT WITH YOU AFTER SURGERY IN YOUR PRIVATE ROOM DURING VISITING HOURS ONLY!                Courtney [name]    Your procedure is scheduled on: 11/10/20   Report to [facility] Main  Entrance   Report to admitting at  7:00 AM     Call this number if you have problems the morning of surgery [phone]   . BRUSH YOUR TEETH MORNING OF SURGERY AND RINSE YOUR MOUTH OUT, NO CHEWING GUM CANDY OR MINTS.   No food after midnight  .  You may have clear liquid until64:30 AM.    At 6:00 AM drink pre surgery drink.   Nothing by mouth after 6:30 AM.    Take these medicines the morning of surgery with A SIP OF WATER: Cymbalta, Synthroid                                 You may not have any metal on your body including hair pins and              piercings  Do not wear jewelry, make-up, lotions, powders or perfumes, deodorant             Do not wear nail polish on your fingernails.  Do not shave  48 hours prior to surgery.  .   Do not bring valuables to the hospital. [place].  Contacts, dentures or bridgework may not be worn into surgery.      _____________________________________________________________________             [facility] - Preparing for Surgery Before surgery, you can play an important role.  Because skin is not sterile, your skin needs to be as free of germs as possible.  You can reduce the number of germs on your skin by washing with CHG (chlorahexidine gluconate) soap before surgery.  CHG is an antiseptic cleaner which kills germs and bonds with the skin to continue killing germs even after washing. Please DO NOT use if you have an allergy to CHG or antibacterial soaps.  If your skin becomes reddened/irritated stop using the CHG and inform your nurse  when you arrive at Short Stay. Do not shave (including legs and underarms) for at least 48 hours prior to the first CHG shower.   . Please follow these instructions carefully:  1.  Shower with CHG Soap the night before surgery and the  morning of Surgery.  2.  If you choose to wash your hair, wash your hair first as usual with your  normal  shampoo.  3.  After you shampoo, rinse your hair and body thoroughly to remove the  shampoo.                                        4.  Use CHG as you would any other liquid soap.  You can apply chg directly  to the skin and wash  Gently with a scrungie or clean washcloth.  5.  Apply the CHG Soap to your body ONLY FROM THE NECK DOWN.   Do not use on face/ open                           Wound or open sores. Avoid contact with eyes, ears mouth and genitals (private parts).                       Wash face,  Genitals (private parts) with your normal soap.             6.  Wash thoroughly, paying special attention to the area where your surgery  will be performed.  7.  Thoroughly rinse your body with warm water from the neck down.  8.  DO NOT shower/wash with your normal soap after using and rinsing off  the CHG Soap.             9.  Pat yourself dry with a clean towel.            10.  Wear clean pajamas.            11.  Place clean sheets on your bed the night of your first shower and do not  sleep with pets. Day of Surgery : Do not apply any lotions/deodorants the morning of surgery.  Please wear clean clothes to the hospital/surgery center.  FAILURE TO FOLLOW THESE INSTRUCTIONS MAY RESULT IN THE CANCELLATION OF YOUR SURGERY PATIENT SIGNATURE_________________________________  NURSE SIGNATURE__________________________________  ________________________________________________________________________   Courtney [name]  An incentive spirometer is a tool that can help keep your lungs clear and active. This tool measures how well you  are filling your lungs with each breath. Taking long deep breaths may help reverse or decrease the chance of developing breathing (pulmonary) problems (especially infection) following:  A long period of time when you are unable to move or be active. BEFORE THE PROCEDURE   If the spirometer includes an indicator to show your best effort, your nurse or respiratory therapist will set it to a desired goal.  If possible, sit up straight or lean slightly forward. Try not to slouch.  Hold the incentive spirometer in an upright position. INSTRUCTIONS FOR USE  1. Sit on the edge of your bed if possible, or sit up as far as you can in bed or on a chair. 2. Hold the incentive spirometer in an upright position. 3. Breathe out normally. 4. Place the mouthpiece in your mouth and seal your lips tightly around it. 5. Breathe in slowly and as deeply as possible, raising the piston or the ball toward the top of the column. 6. Hold your breath for 3-5 seconds or for as long as possible. Allow the piston or ball to fall to the bottom of the column. 7. Remove the mouthpiece from your mouth and breathe out normally. 8. Rest for a few seconds and repeat Steps 1 through 7 at least 10 times every 1-2 hours when you are awake. Take your time and take a few normal breaths between deep breaths. 9. The spirometer may include an indicator to show your best effort. Use the indicator as a goal to work toward during each repetition. 10. After each set of 10 deep breaths, practice coughing to be sure your lungs are clear. If you have an incision (the cut made at the time of surgery), support your incision  when coughing by placing a pillow or rolled up towels firmly against it. Once you are able to get out of bed, walk around indoors and cough well. You may stop using the incentive spirometer when instructed by your caregiver.  RISKS AND COMPLICATIONS  Take your time so you do not get dizzy or light-headed.  If you are in  pain, you may need to take or ask for pain medication before doing incentive spirometry. It is harder to take a deep breath if you are having pain. AFTER USE  Rest and breathe slowly and easily.  It can be helpful to keep track of a log of your progress. Your caregiver can provide you with a simple table to help with this. If you are using the spirometer at home, follow these instructions: [place] IF:   You are having difficultly using the spirometer.  You have trouble using the spirometer as often as instructed.  Your pain medication is not giving enough relief while using the spirometer.  You develop fever of 100.5 F (38.1 C) or higher. SEEK IMMEDIATE MEDICAL CARE IF:   You cough up bloody sputum that had not been present before.  You develop fever of 102 F (38.9 C) or greater.  You develop worsening pain at or near the incision site. MAKE SURE YOU:   Understand these instructions.  Will watch your condition.  Will get help right away if you are not doing well or get worse. Document Released: 10/09/2006 Document Revised: 08/21/2011 Document Reviewed: 12/10/2006 [facility] Patient Information 2014 [place], [place].   ________________________________________________________________________

## 2020-10-28 ENCOUNTER — Encounter (HOSPITAL_COMMUNITY)
Admission: RE | Admit: 2020-10-28 | Discharge: 2020-10-28 | Disposition: A | Discharge status: Home / Self Care | Payer: Medicare Other | Source: Ambulatory Visit | Attending: Orthopedic Surgery | Admitting: Orthopedic Surgery

## 2020-10-28 ENCOUNTER — Other Ambulatory Visit: Payer: Self-pay

## 2020-10-28 ENCOUNTER — Encounter (HOSPITAL_COMMUNITY): Payer: Self-pay

## 2020-10-28 DIAGNOSIS — Q6589 Other specified congenital deformities of hip: Secondary | ICD-10-CM | POA: Diagnosis not present

## 2020-10-28 DIAGNOSIS — G4733 Obstructive sleep apnea (adult) (pediatric): Secondary | ICD-10-CM | POA: Insufficient documentation

## 2020-10-28 DIAGNOSIS — Z01812 Encounter for preprocedural laboratory examination: Secondary | ICD-10-CM | POA: Insufficient documentation

## 2020-10-28 DIAGNOSIS — Z79899 Other long term (current) drug therapy: Secondary | ICD-10-CM | POA: Insufficient documentation

## 2020-10-28 HISTORY — DX: Anxiety disorder, unspecified: F41.9

## 2020-10-28 HISTORY — DX: Presence of urogenital implants: Z96.0

## 2020-10-28 LAB — APTT: aPTT: 30 seconds (ref 24–36)

## 2020-10-28 LAB — COMPREHENSIVE METABOLIC PANEL
ALT: 19 U/L (ref 0–44)
AST: 23 U/L (ref 15–41)
Albumin: 4.4 g/dL (ref 3.5–5.0)
Alkaline Phosphatase: 60 U/L (ref 38–126)
Anion gap: 5 (ref 5–15)
BUN: 24 mg/dL — ABNORMAL HIGH (ref 8–23)
CO2: 28 mmol/L (ref 22–32)
Calcium: 9.7 mg/dL (ref 8.9–10.3)
Chloride: 108 mmol/L (ref 98–111)
Creatinine, Ser: 0.68 mg/dL (ref 0.44–1.00)
GFR, Estimated: >60 mL/min (ref >60)
Glucose, Bld: 105 mg/dL — ABNORMAL HIGH (ref 70–99)
Potassium: 5.4 mmol/L — ABNORMAL HIGH (ref 3.5–5.1)
Sodium: 141 mmol/L (ref 135–145)
Total Bilirubin: 0.4 mg/dL (ref 0.3–1.2)
Total Protein: 6.8 g/dL (ref 6.5–8.1)

## 2020-10-28 LAB — CBC
HCT: 35.3 % — ABNORMAL LOW (ref 36.0–46.0)
Hemoglobin: 11.4 g/dL — ABNORMAL LOW (ref 12.0–15.0)
MCH: 32.4 pg (ref 26.0–34.0)
MCHC: 32.3 g/dL (ref 30.0–36.0)
MCV: 100.3 fL — ABNORMAL HIGH (ref 80.0–100.0)
Platelets: 162 10*3/uL (ref 150–400)
RBC: 3.52 MIL/uL — ABNORMAL LOW (ref 3.87–5.11)
RDW: 12.1 % (ref 11.5–15.5)
WBC: 3.7 10*3/uL — ABNORMAL LOW (ref 4.0–10.5)
nRBC: 0 % (ref 0.0–0.2)

## 2020-10-28 LAB — TYPE AND SCREEN
ABO/RH(D): O POS
Antibody Screen: NEGATIVE

## 2020-10-28 LAB — PROTIME-INR
INR: 1 (ref 0.8–1.2)
Prothrombin Time: 12.7 seconds (ref 11.4–15.2)

## 2020-10-28 LAB — SURGICAL PCR SCREEN
MRSA, PCR: NEGATIVE
Staphylococcus aureus: NEGATIVE

## 2020-10-28 NOTE — Progress Notes (Signed)
COVID Vaccine Completed:Yes 07/30/19-Booster 04/04/20, 09/15/20 Date COVID Vaccine completed: COVID vaccine manufacturer:    Moderna      PCP - Dr. Lady Deutscher Cardiologist - Dr. Joaquim Nam  Chest x-ray - 04/10/20-epic EKG - 08/30/20-epic Stress Test -no  ECHO - 08/30/20-epic Cardiac Cath - no Pacemaker/ICD device last checked:NA  Sleep Study - yes CPAP - no. She tapes her mouth shut with paper tape  Fasting Blood Sugar - NA Checks Blood Sugar _____ times a day  Blood Thinner Instructions:NA Aspirin Instructions: Last Dose:  Anesthesia review:   Patient denies shortness of breath, fever, cough and chest pain at PAT appointment Yes. Pt has no SOB with any activities. She reports that the bicuspid, aortic valve is "less than Dr. Acie Fredrickson thought"  Patient verbalized understanding of instructions that were given to them at the PAT appointment. Patient was also instructed that they will need to review over the PAT instructions again at home before surgery.Yes  Pt was covid + 10/03/20 with a home test. Paxlovid was called to the pharmacy for her.

## 2020-11-01 ENCOUNTER — Encounter (HOSPITAL_COMMUNITY): Payer: Self-pay

## 2020-11-01 NOTE — Progress Notes (Signed)
Anesthesia Chart Review:   Case: [id number] Date/Time: 11/10/20 0915   Procedure: TOTAL HIP ARTHROPLASTY ANTERIOR APPROACH (Right Hip) - 138min   Anesthesia type: Choice   Pre-op diagnosis: right hip dysplasia   Location: [facility]OM 09 / [facility]   Surgeons: Gaynelle Arabian, MD      DISCUSSION: Pt is 70 years old with hx bicuspid aortic valve, OSA (not using CPAP)   VS: BP 117/65   Pulse 72   Temp 36.9 C (Oral)   Resp 20   Ht 5\' 3"  (1.6 m)   Wt 60.3 kg   LMP  (LMP Unknown)   SpO2 100%   BMI 23.56 kg/m   PROVIDERS: PCP is Kelton Pillar, MD  Cardiologist is [name], MD who cleared pt for surgery at last office visit 08/30/20   LABS: Labs reviewed: Acceptable for surgery. (all labs ordered are listed, but only abnormal results are displayed)  Labs Reviewed  CBC - Abnormal; Notable for the following components:      Result Value   WBC 3.7 (*)    RBC 3.52 (*)    Hemoglobin 11.4 (*)    HCT 35.3 (*)    MCV 100.3 (*)    All other components within normal limits  COMPREHENSIVE METABOLIC PANEL - Abnormal; Notable for the following components:   Potassium 5.4 (*)    Glucose, Bld 105 (*)    BUN 24 (*)    All other components within normal limits  SURGICAL PCR SCREEN  PROTIME-INR  APTT  TYPE AND SCREEN     IMAGES: CXR 04/10/20: Negative for acute cardiopulmonary disease   EKG 08/30/20: NSR   CV: Echo 10/14/19:  1. Left ventricular ejection fraction, by estimation, is 60 to 65%. The left ventricle has normal function. The left ventricle has no regional wall motion abnormalities. There is mild left ventricular hypertrophy. Left ventricular diastolic parameters are consistent with Grade II diastolic dysfunction (pseudonormalization).  2. Right ventricular systolic function is normal. The right ventricular size is normal. There is normal pulmonary artery systolic pressure.  3. The mitral valve is normal in structure. Mild to moderate mitral valve regurgitation. No  evidence of mitral stenosis.  4. The aortic valve is bicuspid. Aortic valve regurgitation is not visualized. Mild to moderate aortic valve sclerosis/calcification is present, without any evidence of aortic stenosis.  5. The inferior vena cava is normal in size with greater than 50% respiratory variability, suggesting right atrial pressure of 3 mmHg.    Past Medical History:  Diagnosis Date  . ADHD    ADD  . Allergic to pets    pet dander  . Allergies   . Anemia    take iron  . Anxiety   . Arthritis    hands, hip, shoulders back,  . Asthma    as a child  . Bicuspid aortic valve    bicuspid AV with no stenosis or regurgitation 01/2015 echo   . Complication of anesthesia    slow to wake up and lingers for a long time  . Concussion   . Depression   . Family history of adverse reaction to anesthesia    mom had skin reaction to anesthesia but cant remember what it was or exactly what happened  . Fatigue   . Fibromyalgia   . Hyperlipidemia   . Hypothyroidism   . Irregular periods   . Joint pain   . Joint stiffness   . Lower back pain   . Menopause   .  Migraines    no longer  . Numbness and tingling   . Pollen allergy   . Ringing in ears   . Sleep apnea    does not wear CPAP tapes mouth at night  . Snoring     Past Surgical History:  Procedure Laterality Date  . ABDOMINAL EXPOSURE N/A 04/12/2020   Procedure: ABDOMINAL EXPOSURE;  Surgeon: [name], MD;  Location: [place];  Service: Vascular;  Laterality: N/A;  . ABDOMINOPLASTY    . ANTERIOR CERVICAL DECOMP/DISCECTOMY FUSION N/A 06/28/2016   Procedure: ACDF C5-7 ANTERIOR CERVICAL DECOMPRESSION/DISCECTOMY FUSION 2 LEVELS;  Surgeon: Melina Schools, MD;  Location: [place];  Service: Orthopedics;  Laterality: N/A;  Requests 3 hrs  . ANTERIOR LUMBAR FUSION N/A 04/12/2020   Procedure: Anterior Lumbar Interbody Fusion - Lumbar four-Lumbar five , posterior instrumented fusion Lumbar four-five lumbar three -four;  Surgeon:  Eustace Moore, MD;  Location: [place];  Service: Neurosurgery;  Laterality: N/A;  . CARPAL TUNNEL RELEASE Bilateral 1996  . COLONOSCOPY    . DENTAL SURGERY    . HERNIA REPAIR     times 2  . LAMINECTOMY WITH POSTERIOR LATERAL ARTHRODESIS LEVEL 1 N/A 04/12/2020   Procedure: Posterior Instrumented Fusion Lumbar three to lumbar five;  Surgeon: Eustace Moore, MD;  Location: [place];  Service: Neurosurgery;  Laterality: N/A;  . LUMBAR LAMINECTOMY/DECOMPRESSION MICRODISCECTOMY  04/21/2015   Procedure: DECOMPRESSION LUMBAR THREE-LUMBAR FOUR;  Surgeon: Melina Schools, MD;  Location: [place];  Service: Orthopedics;;  . LUMBAR LAMINECTOMY/DECOMPRESSION MICRODISCECTOMY N/A 06/20/2017   Procedure: L4-5 decompression, L5-S1 left laminotomy/foraminotomy;  Surgeon: Melina Schools, MD;  Location: [place];  Service: Orthopedics;  Laterality: N/A;  3 hrs  . PARATHYROIDECTOMY  2012  . SACROILIAC JOINT FUSION Right 07/24/2018   Procedure: SACROILIAC JOINT FUSION;  Surgeon: Melina Schools, MD;  Location: [place];  Service: Orthopedics;  Laterality: Right;  90 mins  . SHOULDER ARTHROSCOPY W/ ACROMIAL REPAIR Right   . TONSILLECTOMY      MEDICATIONS: . acetaminophen (TYLENOL) 500 MG tablet  . amoxicillin (AMOXIL) 500 MG tablet  . Artificial Tear Solution ([place] OP)  . Ascorbic Acid (VITAMIN C) 1000 MG tablet  . atorvastatin (LIPITOR) 10 MG tablet  . CALCIUM CITRATE PO  . Carboxymethylcellul-Glycerin ([place] OP)  . cholecalciferol (VITAMIN D3) 25 MCG (1000 UNIT) tablet  . clobetasol ointment (TEMOVATE) 0.05 %  . Coenzyme Q10 300 MG CAPS  . diclofenac sodium (VOLTAREN) 1 % GEL  . docusate sodium (COLACE) 100 MG capsule  . DULoxetine (CYMBALTA) 30 MG capsule  . EPINEPHrine (EPIPEN JR) 0.15 MG/0.3ML injection  . estradiol (ESTRACE) 0.1 MG/GM vaginal cream  . FERROUS BISGLYCINATE CHELATE PO  . fluticasone (FLONASE) 50 MCG/ACT nasal spray  . lactobacillus acidophilus (BACID) TABS tablet  . levothyroxine  (SYNTHROID, LEVOTHROID) 25 MCG tablet  . Lidocaine 4 % PTCH  . loratadine (CLARITIN) 10 MG tablet  . MAGNESIUM CITRATE PO  . meloxicam (MOBIC) 15 MG tablet  . methocarbamol (ROBAXIN) 500 MG tablet  . Multiple Vitamins-Minerals (MULTIVITAMIN WITH MINERALS) tablet  . naproxen sodium (ALEVE) 220 MG tablet  . Omega-3 Fatty Acids (OMEGA 3 PO)  . Oxycodone HCl 10 MG TABS  . Potassium 99 MG TABS  . Psyllium Husk POWD  . traZODone (DESYREL) 100 MG tablet  . urea (CARMOL) 40 % CREA  . vitamin E 400 UNIT capsule  . Zinc Sulfate (ZINC 15 PO)   No current facility-administered medications for this encounter.  If no changes, I anticipate pt can proceed with surgery as scheduled.   Willeen Cass, PhD, FNP-BC [facility]/Anesthesiology Phone: [phone] 11/01/2020 3:44 PM

## 2020-11-01 NOTE — Anesthesia Preprocedure Evaluation (Addendum)
Anesthesia Evaluation  Patient identified by MRN, date of birth, ID band Patient awake    Reviewed: Allergy & Precautions, NPO status , Patient's Chart, lab work & pertinent test results  Airway Mallampati: II  TM Distance: >3 FB Neck ROM: Full    Dental no notable dental hx.    Pulmonary sleep apnea , former smoker,    Pulmonary exam normal breath sounds clear to auscultation       Cardiovascular negative cardio ROS Normal cardiovascular exam Rhythm:Regular Rate:Normal     Neuro/Psych negative neurological ROS  negative psych ROS   GI/Hepatic negative GI ROS, Neg liver ROS,   Endo/Other  Hypothyroidism   Renal/GU negative Renal ROS  negative genitourinary   Musculoskeletal  (+) Arthritis ,   Abdominal   Peds negative pediatric ROS (+)  Hematology negative hematology ROS (+)   Anesthesia Other Findings   Reproductive/Obstetrics negative OB ROS                            Anesthesia Physical Anesthesia Plan  ASA: II  Anesthesia Plan: Spinal   Post-op Pain Management:    Induction: Intravenous  PONV Risk Score and Plan: 2 and Ondansetron, Dexamethasone and Treatment may vary due to age or medical condition  Airway Management Planned: Simple Face Mask  Additional Equipment:   Intra-op Plan:   Post-operative Plan:   Informed Consent: I have reviewed the patients History and Physical, chart, labs and discussed the procedure including the risks, benefits and alternatives for the proposed anesthesia with the patient or authorized representative who has indicated his/her understanding and acceptance.     Dental advisory given  Plan Discussed with: CRNA and Surgeon  Anesthesia Plan Comments: (See APP note by Durel Salts, FNP   Patient has had multiple back procedures. Discussed R/B of SAB, will attempt at approximately L2.  She is fine with General Anesthesia if unsuccesful)        Anesthesia Quick Evaluation

## 2020-11-04 NOTE — H&P (Signed)
TOTAL HIP ADMISSION H&P  Patient is admitted for right total hip arthroplasty.  Subjective:  Chief Complaint: Right hip pain  HPI: Courtney [name], 70 y.o. female, has a history of pain and functional disability in the right hip due to arthritis and patient has failed non-surgical conservative treatments for greater than 12 weeks to include NSAID's and/or analgesics and activity modification. Onset of symptoms was gradual, starting several years ago with gradually worsening course since that time. The patient noted no past surgery on the right hip. Patient currently rates pain in the right hip at 8 out of 10 with activity. Patient has night pain, worsening of pain with activity and weight bearing, pain that interfers with activities of daily living and pain with passive range of motion. Patient has evidence of severe end-stage arthritis of the right hip with acetabular dysplasia and large osteophyte formation. She has some cystic changes in the femoral head by imaging studies. This condition presents safety issues increasing the risk of falls. There is no current active infection.  Patient Active Problem List   Diagnosis Date Noted  . S/P lumbar fusion 04/12/2020  . Bicuspid aortic valve 09/25/2019  . SI (sacroiliac) pain 07/24/2018  . Status post lumbar spine surgery for decompression of spinal cord 06/20/2017  . ADD (attention deficit disorder) 01/05/2017  . Asthma, extrinsic 01/05/2017  . GERD (gastroesophageal reflux disease) 01/05/2017  . Hypercalcemia 01/05/2017  . Insomnia 01/05/2017  . Internal hemorrhoids 01/05/2017  . Mixed incontinence urge and stress 11/22/2016  . Neck pain 06/28/2016  . Spinal stenosis 04/21/2015  . Preoperative clearance 01/29/2015  . Hyperlipidemia 01/29/2015  . H/O cardiac murmur 01/29/2015  . Lumbar facet arthropathy 10/27/2014  . Lumbar spondylosis with myelopathy 06/23/2014  . Spinal stenosis, lumbar region, with neurogenic claudication 06/23/2014   . Degenerative cervical disc 06/23/2014  . Cervical spondylosis without myelopathy 06/23/2014  . High risk medication use 10/15/2013  . Facet arthropathy, cervical 08/28/2013  . Fatty liver 04/26/2013  . Hemangioma 04/09/2013  . DDD (degenerative disc disease), lumbosacral 12/31/2012  . Left hip pain 12/31/2012  . Lumbar radiculopathy 12/12/2012  . Left shoulder pain 08/07/2012  . Low back pain 08/07/2012  . H/O thyroid nodule 06/08/2012  . ADHD (attention deficit hyperactivity disorder) 12/24/2011  . Depression 07/11/2011  . Fatigue 07/11/2011    Past Medical History:  Diagnosis Date  . ADHD    ADD  . Allergic to pets    pet dander  . Allergies   . Anemia    take iron  . Anxiety   . Arthritis    hands, hip, shoulders back,  . Asthma    as a child  . Bicuspid aortic valve    bicuspid AV with no stenosis or regurgitation 01/2015 echo   . Complication of anesthesia    slow to wake up and lingers for a long time  . Concussion   . Depression   . Family history of adverse reaction to anesthesia    mom had skin reaction to anesthesia but cant remember what it was or exactly what happened  . Fatigue   . Fibromyalgia   . Hyperlipidemia   . Hypothyroidism   . Irregular periods   . Joint pain   . Joint stiffness   . Lower back pain   . Menopause   . Migraines    no longer  . Numbness and tingling   . Pollen allergy   . Presence of pessary   . Ringing in ears   .  Sleep apnea    does not wear CPAP tapes mouth at night  . Snoring     Past Surgical History:  Procedure Laterality Date  . ABDOMINAL EXPOSURE N/A 04/12/2020   Procedure: ABDOMINAL EXPOSURE;  Surgeon: [name], MD;  Location: [place];  Service: Vascular;  Laterality: N/A;  . ABDOMINOPLASTY    . ANTERIOR CERVICAL DECOMP/DISCECTOMY FUSION N/A 06/28/2016   Procedure: ACDF C5-7 ANTERIOR CERVICAL DECOMPRESSION/DISCECTOMY FUSION 2 LEVELS;  Surgeon: Melina Schools, MD;  Location: [place];  Service:  Orthopedics;  Laterality: N/A;  Requests 3 hrs  . ANTERIOR LUMBAR FUSION N/A 04/12/2020   Procedure: Anterior Lumbar Interbody Fusion - Lumbar four-Lumbar five , posterior instrumented fusion Lumbar four-five lumbar three -four;  Surgeon: Eustace Moore, MD;  Location: [place];  Service: Neurosurgery;  Laterality: N/A;  . CARPAL TUNNEL RELEASE Bilateral 1996  . COLONOSCOPY    . DENTAL SURGERY    . HERNIA REPAIR     times 2  . LAMINECTOMY WITH POSTERIOR LATERAL ARTHRODESIS LEVEL 1 N/A 04/12/2020   Procedure: Posterior Instrumented Fusion Lumbar three to lumbar five;  Surgeon: Eustace Moore, MD;  Location: [place];  Service: Neurosurgery;  Laterality: N/A;  . LUMBAR LAMINECTOMY/DECOMPRESSION MICRODISCECTOMY  04/21/2015   Procedure: DECOMPRESSION LUMBAR THREE-LUMBAR FOUR;  Surgeon: Melina Schools, MD;  [place];  Service: Orthopedics;;  . LUMBAR LAMINECTOMY/DECOMPRESSION MICRODISCECTOMY N/A 06/20/2017   Procedure: L4-5 decompression, L5-S1 left laminotomy/foraminotomy;  Surgeon: Melina Schools, MD;  Location: [place];  Service: Orthopedics;  Laterality: N/A;  3 hrs  . PARATHYROIDECTOMY  2012  . SACROILIAC JOINT FUSION Right 07/24/2018   Procedure: SACROILIAC JOINT FUSION;  Surgeon: Melina Schools, MD;  Location: [place];  Service: Orthopedics;  Laterality: Right;  90 mins  . SHOULDER ARTHROSCOPY W/ ACROMIAL REPAIR Right   . TONSILLECTOMY      Prior to Admission medications   Medication Sig Start Date End Date Taking? Authorizing Provider  acetaminophen (TYLENOL) 500 MG tablet Take 500 mg by mouth in the morning and at bedtime.   Yes [provider]  amoxicillin (AMOXIL) 500 MG tablet Take 2,000 mg by mouth See admin instructions. Dental procedures   Yes [provider]  Artificial Tear Solution (GENTE[place] TEARS OP) Place 1 drop into both eyes daily as needed (dry eyes).   Yes [provider]  Ascorbic Acid (VITAMIN C) 1000 MG tablet Take 1,000 mg by mouth in the morning and  at bedtime.   Yes [provider]  atorvastatin (LIPITOR) 10 MG tablet Take 10 mg by mouth every 3 (three) days. In the evening   Yes [provider]  CALCIUM CITRATE PO Take 1,000 mg by mouth daily.   Yes [provider]  Carboxymethylcellul-Glycerin ([place] OP) Place 1 drop into both eyes daily as needed (dry eyes).   Yes [provider]  cholecalciferol (VITAMIN D3) 25 MCG (1000 UNIT) tablet Take 1,000 Units by mouth 2 (two) times daily.   Yes [provider]  clobetasol ointment (TEMOVATE) 9.76 % Apply 1 application topically every 30 (thirty) days.   Yes [provider]  Coenzyme Q10 300 MG CAPS Take 300 mg by mouth daily.    Yes [provider]  diclofenac sodium (VOLTAREN) 1 % GEL Apply 1 application topically daily as needed (pain).    Yes [provider]  docusate sodium (COLACE) 100 MG capsule Take 100 mg by mouth every evening.   Yes [provider]  DULoxetine (CYMBALTA) 30  MG capsule Take 30 mg by mouth daily after breakfast.  12/09/14  Yes [provider]  EPINEPHrine (EPIPEN JR) 0.15 MG/0.3ML injection Inject 0.15 mg into the muscle as needed for anaphylaxis.   Yes [provider]  estradiol (ESTRACE) 0.1 MG/GM vaginal cream Place 0.1 g vaginally every 3 (three) days.    Yes [provider]  FERROUS BISGLYCINATE CHELATE PO Take 25 mg by mouth daily.    Yes [provider]  fluticasone (FLONASE) 50 MCG/ACT nasal spray Place 1 spray into both nostrils at bedtime.    Yes [provider]  lactobacillus acidophilus (BACID) TABS tablet Take 1 tablet by mouth daily.   Yes [provider]  levothyroxine (SYNTHROID, LEVOTHROID) 25 MCG tablet Take 1 1/2 tablets daily Patient taking differently: Take 37.5 mcg by mouth daily before breakfast. Take 1 1/2 tablets daily 11/12/17  Yes Elayne Snare, MD  Lidocaine 4 % PTCH Apply 1 patch topically daily as needed  (pain).    Yes [provider]  loratadine (CLARITIN) 10 MG tablet Take 10 mg by mouth daily as needed (for seasonal allergies (Spring/Summer)).    Yes [provider]  MAGNESIUM CITRATE PO Take 1,000 mg by mouth 2 (two) times daily.    Yes [provider]  meloxicam (MOBIC) 15 MG tablet Take 15 mg by mouth daily.   Yes [provider]  methocarbamol (ROBAXIN) 500 MG tablet Take 1 tablet (500 mg total) by mouth every 8 (eight) hours as needed for muscle spasms. 04/14/20  Yes Eustace Moore, MD  Multiple Vitamins-Minerals (MULTIVITAMIN WITH MINERALS) tablet Take 1 tablet by mouth every other day.  09/02/09  Yes [provider]  naproxen sodium (ALEVE) 220 MG tablet Take 220 mg by mouth every evening.   Yes [provider]  Omega-3 Fatty Acids (OMEGA 3 PO) Take 2,560 mg by mouth at bedtime.    Yes [provider]  Oxycodone HCl 10 MG TABS Take 5-7.5 mg by mouth 4 (four) times daily as needed (pain). 07/18/20  Yes [provider]  Potassium 99 MG TABS Take 99 mg by mouth every evening.   Yes [provider]  Psyllium Husk POWD Take 1 capsule by mouth 2 (two) times daily.   Yes [provider]  traZODone (DESYREL) 100 MG tablet Take 30-50 mg by mouth at bedtime.   Yes [provider]  urea (CARMOL) 40 % CREA Apply 1 application topically 2 (two) times a week. 02/05/14  Yes [provider]  vitamin E 400 UNIT capsule Take 400 Units by mouth every 3 (three) days.    Yes [provider]  Zinc Sulfate (ZINC 15 PO) Take 15 mg by mouth daily.   Yes [provider]    Allergies  Allergen Reactions  . Fish Allergy Anaphylaxis    Can tolerate shellfish. Allergic to fish with shells and fins.  . Peanuts [Peanut Oil] Anaphylaxis    Can eat cashews, pistachios, and almonds    Social History   Socioeconomic History  . Marital status: Widowed    Spouse name: Not on file  . Number of  children: 2  . Years of education: Master's   . Highest education level: Not on file  Occupational History  . Occupation: Retired   Tobacco Use  . Smoking status: Former Smoker    Packs/day: 0.25    Years: 4.00    Pack years: 1.00    Types: Cigarettes    Quit date: 1976  Years since quitting: 46.4  . Smokeless tobacco: Never Used  . Tobacco comment: social smoker stop at age 51  Vaping Use  . Vaping Use: Never used  Substance and Sexual Activity  . Alcohol use: Yes    Alcohol/week: 1.0 standard drink    Types: 1 Glasses of wine per week    Comment: socially  . Drug use: No  . Sexual activity: Never  Other Topics Concern  . Not on file  Social History Narrative   Lives alone   Caffeine use: 1 cup coffee/day   Social Determinants of Health   Financial Resource Strain: Not on file  Food Insecurity: Not on file  Transportation Needs: Not on file  Physical Activity: Not on file  Stress: Not on file  Social Connections: Not on file  Intimate Partner Violence: Not on file    Tobacco Use: Medium Risk  . Smoking Tobacco Use: Former Smoker  . Smokeless Tobacco Use: Never Used   Social History   Substance and Sexual Activity  Alcohol Use Yes  . Alcohol/week: 1.0 standard drink  . Types: 1 Glasses of wine per week   Comment: socially    Family History  Problem Relation Age of Onset  . COPD Mother   . Cancer Mother   . Hypertension Mother   . Hyperlipidemia Mother   . Heart disease Mother   . Thyroid disease Mother   . Thyroid disease Sister   . Thyroid disease Daughter     Review of Systems  Constitutional: Negative for chills and fever.  HENT: Negative for congestion, sore throat and tinnitus.   Eyes: Negative for double vision, photophobia and pain.  Respiratory: Negative for cough, shortness of breath and wheezing.   Cardiovascular: Negative for chest pain, palpitations and orthopnea.  Gastrointestinal: Negative for heartburn, nausea and vomiting.   Genitourinary: Negative for dysuria, frequency and urgency.  Musculoskeletal: Positive for joint pain.  Neurological: Negative for dizziness, weakness and headaches.     Objective:  Physical Exam: Well nourished and well developed.  General: Alert and oriented x3, cooperative and pleasant, no acute distress.  Head: normocephalic, atraumatic, neck supple.  Eyes: EOMI.  Respiratory: breath sounds clear in all fields, no wheezing, rales, or rhonchi. Cardiovascular: Regular rate and rhythm, no murmurs, gallops or rubs.  Abdomen: non-tender to palpation and soft, normoactive bowel sounds. Musculoskeletal:  Right Hip Exam:  The range of motion: Flexion to 120 degrees, Internal Rotation to 15 degrees, External Rotation to 40 degrees, and abduction to 40 degrees without discomfort.  There is no tenderness over the greater trochanteric bursa.  There is pain on provocative testing  Calves soft and nontender. Motor function intact in LE. Strength 5/5 LE bilaterally. Neuro: Distal pulses 2+. Sensation to light touch intact in LE.  Imaging Review Plain radiographs demonstrate severe degenerative joint disease of the right hip. The bone quality appears to be adequate for age and reported activity level.  Assessment/Plan:  End stage arthritis, right hip  The patient history, physical examination, clinical judgement of the provider and imaging studies are consistent with end stage degenerative joint disease of the right hip and total hip arthroplasty is deemed medically necessary. The treatment options including medical management, injection therapy, arthroscopy and arthroplasty were discussed at length. The risks and benefits of total hip arthroplasty were presented and reviewed. The risks due to aseptic loosening, infection, stiffness, dislocation/subluxation, thromboembolic complications and other imponderables were discussed. The patient acknowledged the explanation, agreed to proceed with the  plan and consent was signed. Patient is being admitted for inpatient treatment for surgery, pain control, PT, OT, prophylactic antibiotics, VTE prophylaxis, progressive ambulation and ADLs and discharge planning.The patient is planning to be discharged home.   Patient's anticipated LOS is less than 2 midnights, meeting these requirements: - Younger than 54 - Lives within 1 hour of care - Has a competent adult at home to recover with post-op recover - NO history of  - Diabetes  - Coronary Artery Disease  - Heart failure  - Heart attack  - Stroke  - DVT/VTE  - Cardiac arrhythmia  - Respiratory Failure/COPD  - Renal failure  - Anemia  - Advanced Liver disease  Therapy Plans: HEP Disposition: Home with friend Planned DVT Prophylaxis: Aspirin 325 mg BID DME Needed: None PCP: Kelton Pillar, MD (clearance received) Cardiologist: [name], MD (clearance received) TXA: IV Allergies: NKDA Anesthesia Concerns: Extensive lumbar surgery BMI: 23.4 Last HgbA1c: Not diabetic  Pharmacy: [facility]  Other:  - Oxycodone 10 mg Q6 (prescribed through Dr. Nelva Bush) - Wants SDD if possible (states she has handled previous surgeries with pain management) - May require general anesthesia - multiple lumbar fusions   - Patient was instructed on what medications to stop prior to surgery. - Follow-up visit in 2 weeks with Dr. Wynelle Link - Begin physical therapy following surgery - Pre-operative lab work as pre-surgical testing - Prescriptions will be provided in hospital at time of discharge  Theresa Duty, PA-C Orthopedic Surgery [facility] [place]

## 2020-11-10 ENCOUNTER — Ambulatory Visit (HOSPITAL_COMMUNITY): Payer: Medicare Other | Admitting: Anesthesiology

## 2020-11-10 ENCOUNTER — Encounter (HOSPITAL_COMMUNITY)
Admission: RE | Disposition: A | Discharge status: Home / Self Care | Payer: Self-pay | Source: Ambulatory Visit | Attending: Orthopedic Surgery

## 2020-11-10 ENCOUNTER — Ambulatory Visit (HOSPITAL_COMMUNITY): Payer: Medicare Other | Admitting: Emergency Medicine

## 2020-11-10 ENCOUNTER — Ambulatory Visit (HOSPITAL_COMMUNITY): Payer: Medicare Other

## 2020-11-10 ENCOUNTER — Encounter (HOSPITAL_COMMUNITY): Payer: Self-pay | Admitting: Orthopedic Surgery

## 2020-11-10 ENCOUNTER — Ambulatory Visit (HOSPITAL_COMMUNITY)
Admission: RE | Admit: 2020-11-10 | Discharge: 2020-11-10 | Disposition: A | Discharge status: Home / Self Care | Payer: Medicare Other | Source: Ambulatory Visit | Attending: Orthopedic Surgery | Admitting: Orthopedic Surgery

## 2020-11-10 DIAGNOSIS — M1611 Unilateral primary osteoarthritis, right hip: Secondary | ICD-10-CM | POA: Insufficient documentation

## 2020-11-10 DIAGNOSIS — Z87891 Personal history of nicotine dependence: Secondary | ICD-10-CM | POA: Insufficient documentation

## 2020-11-10 DIAGNOSIS — Z79890 Hormone replacement therapy (postmenopausal): Secondary | ICD-10-CM | POA: Insufficient documentation

## 2020-11-10 DIAGNOSIS — M25751 Osteophyte, right hip: Secondary | ICD-10-CM | POA: Diagnosis not present

## 2020-11-10 DIAGNOSIS — Z96649 Presence of unspecified artificial hip joint: Secondary | ICD-10-CM

## 2020-11-10 DIAGNOSIS — Z471 Aftercare following joint replacement surgery: Secondary | ICD-10-CM | POA: Diagnosis not present

## 2020-11-10 DIAGNOSIS — M48062 Spinal stenosis, lumbar region with neurogenic claudication: Secondary | ICD-10-CM | POA: Diagnosis not present

## 2020-11-10 DIAGNOSIS — M169 Osteoarthritis of hip, unspecified: Secondary | ICD-10-CM | POA: Diagnosis present

## 2020-11-10 DIAGNOSIS — E785 Hyperlipidemia, unspecified: Secondary | ICD-10-CM | POA: Diagnosis not present

## 2020-11-10 DIAGNOSIS — Z79899 Other long term (current) drug therapy: Secondary | ICD-10-CM | POA: Insufficient documentation

## 2020-11-10 DIAGNOSIS — Z96641 Presence of right artificial hip joint: Secondary | ICD-10-CM | POA: Diagnosis not present

## 2020-11-10 DIAGNOSIS — M1631 Unilateral osteoarthritis resulting from hip dysplasia, right hip: Secondary | ICD-10-CM | POA: Diagnosis not present

## 2020-11-10 DIAGNOSIS — S72041A Displaced fracture of base of neck of right femur, initial encounter for closed fracture: Secondary | ICD-10-CM

## 2020-11-10 HISTORY — PX: TOTAL HIP ARTHROPLASTY: SHX124

## 2020-11-10 SURGERY — ARTHROPLASTY, HIP, TOTAL, ANTERIOR APPROACH
Anesthesia: Spinal | Site: Hip | Laterality: Right

## 2020-11-10 MED ORDER — LACTATED RINGERS IV BOLUS
500.0000 mL | Freq: Once | INTRAVENOUS | Status: AC
  Administered 2020-11-10: 500 mL via INTRAVENOUS

## 2020-11-10 MED ORDER — MIDAZOLAM HCL 2 MG/2ML IJ SOLN
INTRAMUSCULAR | Status: AC
  Filled 2020-11-10: qty 2

## 2020-11-10 MED ORDER — PROPOFOL 500 MG/50ML IV EMUL
INTRAVENOUS | Status: DC | PRN
  Administered 2020-11-10: 50 ug/kg/min via INTRAVENOUS

## 2020-11-10 MED ORDER — ACETAMINOPHEN 10 MG/ML IV SOLN
1000.0000 mg | Freq: Once | INTRAVENOUS | Status: AC
  Administered 2020-11-10: 1000 mg via INTRAVENOUS
  Filled 2020-11-10: qty 100

## 2020-11-10 MED ORDER — OXYCODONE HCL 5 MG PO TABS: 5.0000 mg | ORAL_TABLET | Freq: Four times a day (QID) | ORAL | Status: DC | PRN

## 2020-11-10 MED ORDER — ONDANSETRON HCL 4 MG/2ML IJ SOLN: 4.0000 mg | Freq: Once | INTRAMUSCULAR | Status: DC | PRN

## 2020-11-10 MED ORDER — LACTATED RINGERS IV SOLN: INTRAVENOUS | Status: DC

## 2020-11-10 MED ORDER — POVIDONE-IODINE 10 % EX SWAB
2.0000 "application " | Freq: Once | CUTANEOUS | Status: AC
  Administered 2020-11-10: 2 via TOPICAL

## 2020-11-10 MED ORDER — ASPIRIN EC 325 MG PO TBEC: 325.0000 mg | DELAYED_RELEASE_TABLET | Freq: Two times a day (BID) | ORAL | 0 refills | Status: DC

## 2020-11-10 MED ORDER — FENTANYL CITRATE (PF) 250 MCG/5ML IJ SOLN
INTRAMUSCULAR | Status: AC
  Filled 2020-11-10: qty 5

## 2020-11-10 MED ORDER — MIDAZOLAM HCL 5 MG/5ML IJ SOLN
INTRAMUSCULAR | Status: DC | PRN
  Administered 2020-11-10 (×2): 1 mg via INTRAVENOUS

## 2020-11-10 MED ORDER — BUPIVACAINE-EPINEPHRINE (PF) 0.25% -1:200000 IJ SOLN
INTRAMUSCULAR | Status: DC | PRN
  Administered 2020-11-10: 30 mL

## 2020-11-10 MED ORDER — LACTATED RINGERS IV BOLUS
250.0000 mL | Freq: Once | INTRAVENOUS | Status: AC
  Administered 2020-11-10: 250 mL via INTRAVENOUS

## 2020-11-10 MED ORDER — DEXAMETHASONE SODIUM PHOSPHATE 10 MG/ML IJ SOLN: 8.0000 mg | Freq: Once | INTRAMUSCULAR | Status: DC

## 2020-11-10 MED ORDER — ONDANSETRON HCL 4 MG/2ML IJ SOLN
INTRAMUSCULAR | Status: DC | PRN
  Administered 2020-11-10: 4 mg via INTRAVENOUS

## 2020-11-10 MED ORDER — LIDOCAINE 2% (20 MG/ML) 5 ML SYRINGE
INTRAMUSCULAR | Status: AC
  Filled 2020-11-10: qty 5

## 2020-11-10 MED ORDER — LIDOCAINE 2% (20 MG/ML) 5 ML SYRINGE
INTRAMUSCULAR | Status: DC | PRN
  Administered 2020-11-10: 100 mg via INTRAVENOUS

## 2020-11-10 MED ORDER — WATER FOR IRRIGATION, STERILE IR SOLN
Status: DC | PRN
  Administered 2020-11-10: 2000 mL

## 2020-11-10 MED ORDER — OXYCODONE HCL 5 MG PO TABS: 5.0000 mg | ORAL_TABLET | Freq: Four times a day (QID) | ORAL | 0 refills | Status: DC | PRN

## 2020-11-10 MED ORDER — HYDROMORPHONE HCL 1 MG/ML IJ SOLN: 0.2500 mg | INTRAMUSCULAR | Status: DC | PRN

## 2020-11-10 MED ORDER — DEXAMETHASONE SODIUM PHOSPHATE 10 MG/ML IJ SOLN
INTRAMUSCULAR | Status: DC | PRN
  Administered 2020-11-10: 8 mg via INTRAVENOUS

## 2020-11-10 MED ORDER — ACETAMINOPHEN 10 MG/ML IV SOLN: 1000.0000 mg | Freq: Four times a day (QID) | INTRAVENOUS | Status: DC

## 2020-11-10 MED ORDER — ORAL CARE MOUTH RINSE: 15.0000 mL | Freq: Once | OROMUCOSAL | Status: AC

## 2020-11-10 MED ORDER — OXYCODONE HCL 5 MG/5ML PO SOLN: 5.0000 mg | Freq: Once | ORAL | Status: AC | PRN

## 2020-11-10 MED ORDER — METHOCARBAMOL 500 MG IVPB - SIMPLE MED: 500.0000 mg | Freq: Four times a day (QID) | INTRAVENOUS | Status: DC | PRN

## 2020-11-10 MED ORDER — PROPOFOL 1000 MG/100ML IV EMUL
INTRAVENOUS | Status: AC
  Filled 2020-11-10: qty 100

## 2020-11-10 MED ORDER — PHENYLEPHRINE HCL (PRESSORS) 10 MG/ML IV SOLN
INTRAVENOUS | Status: AC
  Filled 2020-11-10: qty 1

## 2020-11-10 MED ORDER — METHOCARBAMOL 500 MG PO TABS: 500.0000 mg | ORAL_TABLET | Freq: Four times a day (QID) | ORAL | Status: DC | PRN

## 2020-11-10 MED ORDER — 0.9 % SODIUM CHLORIDE (POUR BTL) OPTIME
TOPICAL | Status: DC | PRN
  Administered 2020-11-10: 1000 mL

## 2020-11-10 MED ORDER — CHLORHEXIDINE GLUCONATE 0.12 % MT SOLN
15.0000 mL | Freq: Once | OROMUCOSAL | Status: AC
  Administered 2020-11-10: 15 mL via OROMUCOSAL

## 2020-11-10 MED ORDER — ASPIRIN EC 325 MG PO TBEC: 325.0000 mg | DELAYED_RELEASE_TABLET | Freq: Two times a day (BID) | ORAL | 0 refills | Status: AC

## 2020-11-10 MED ORDER — OXYCODONE HCL 5 MG PO TABS
5.0000 mg | ORAL_TABLET | Freq: Once | ORAL | Status: AC | PRN
Start: 2020-11-10 — End: 2020-11-10

## 2020-11-10 MED ORDER — KETAMINE HCL 10 MG/ML IJ SOLN
INTRAMUSCULAR | Status: AC
  Filled 2020-11-10: qty 1

## 2020-11-10 MED ORDER — TRANEXAMIC ACID-NACL 1000-0.7 MG/100ML-% IV SOLN
1000.0000 mg | INTRAVENOUS | Status: AC
  Administered 2020-11-10: 1000 mg via INTRAVENOUS
  Filled 2020-11-10: qty 100

## 2020-11-10 MED ORDER — PROPOFOL 10 MG/ML IV BOLUS
INTRAVENOUS | Status: DC | PRN
  Administered 2020-11-10: 20 mg via INTRAVENOUS

## 2020-11-10 MED ORDER — DEXAMETHASONE SODIUM PHOSPHATE 10 MG/ML IJ SOLN
INTRAMUSCULAR | Status: AC
  Filled 2020-11-10: qty 1

## 2020-11-10 MED ORDER — FENTANYL CITRATE (PF) 100 MCG/2ML IJ SOLN
INTRAMUSCULAR | Status: DC | PRN
  Administered 2020-11-10: 100 ug via INTRAVENOUS

## 2020-11-10 MED ORDER — BUPIVACAINE-EPINEPHRINE (PF) 0.25% -1:200000 IJ SOLN
INTRAMUSCULAR | Status: AC
  Filled 2020-11-10: qty 30

## 2020-11-10 MED ORDER — OXYCODONE HCL 5 MG PO TABS
ORAL_TABLET | ORAL | Status: AC
  Administered 2020-11-10: 5 mg via ORAL
  Filled 2020-11-10: qty 1

## 2020-11-10 MED ORDER — PHENYLEPHRINE HCL-NACL 10-0.9 MG/250ML-% IV SOLN
INTRAVENOUS | Status: DC | PRN
  Administered 2020-11-10: 50 ug/min via INTRAVENOUS

## 2020-11-10 MED ORDER — PROPOFOL 10 MG/ML IV BOLUS
INTRAVENOUS | Status: AC
  Filled 2020-11-10: qty 20

## 2020-11-10 MED ORDER — ONDANSETRON HCL 4 MG/2ML IJ SOLN
INTRAMUSCULAR | Status: AC
  Filled 2020-11-10: qty 2

## 2020-11-10 MED ORDER — ROCURONIUM BROMIDE 10 MG/ML (PF) SYRINGE
PREFILLED_SYRINGE | INTRAVENOUS | Status: AC
  Filled 2020-11-10: qty 10

## 2020-11-10 MED ORDER — BUPIVACAINE IN DEXTROSE 0.75-8.25 % IT SOLN
INTRATHECAL | Status: DC | PRN
  Administered 2020-11-10: 1.4 mL via INTRATHECAL

## 2020-11-10 MED ORDER — CEFAZOLIN SODIUM-DEXTROSE 2-4 GM/100ML-% IV SOLN
2.0000 g | INTRAVENOUS | Status: AC
  Administered 2020-11-10: 2 g via INTRAVENOUS
  Filled 2020-11-10: qty 100

## 2020-11-10 SURGICAL SUPPLY — 42 items
BAG DECANTER FOR FLEXI CONT (MISCELLANEOUS) IMPLANT
BAG ZIPLOCK 12X15 (MISCELLANEOUS) IMPLANT
BLADE SAG 18X100X1.27 (BLADE) ×2 IMPLANT
CLSR STERI-STRIP ANTIMIC 1/2X4 (GAUZE/BANDAGES/DRESSINGS) ×2 IMPLANT
COVER PERINEAL POST (MISCELLANEOUS) ×2 IMPLANT
COVER SURGICAL LIGHT HANDLE (MISCELLANEOUS) ×2 IMPLANT
COVER WAND RF STERILE (DRAPES) IMPLANT
CUP ACETBLR 48 OD SECTOR II (Hips) ×2 IMPLANT
DECANTER SPIKE VIAL GLASS SM (MISCELLANEOUS) ×2 IMPLANT
DRAPE FOOT SWITCH (DRAPES) ×2 IMPLANT
DRAPE STERI IOBAN 125X83 (DRAPES) ×2 IMPLANT
DRAPE U-SHAPE 47X51 STRL (DRAPES) ×4 IMPLANT
DRSG AQUACEL AG ADV 3.5X10 (GAUZE/BANDAGES/DRESSINGS) ×2 IMPLANT
DURAPREP 26ML APPLICATOR (WOUND CARE) ×2 IMPLANT
ELECT REM PT RETURN 15FT ADLT (MISCELLANEOUS) ×2 IMPLANT
GLOVE SRG 8 PF TXTR STRL LF DI (GLOVE) ×1 IMPLANT
GLOVE SURG ENC MOIS LTX SZ6.5 (GLOVE) ×2 IMPLANT
GLOVE SURG ENC MOIS LTX SZ7 (GLOVE) ×2 IMPLANT
GLOVE SURG ENC MOIS LTX SZ8 (GLOVE) ×4 IMPLANT
GLOVE SURG UNDER POLY LF SZ7 (GLOVE) ×2 IMPLANT
GLOVE SURG UNDER POLY LF SZ8 (GLOVE) ×1
GLOVE SURG UNDER POLY LF SZ8.5 (GLOVE) IMPLANT
GOWN STRL REUS W/TWL LRG LVL3 (GOWN DISPOSABLE) ×4 IMPLANT
GOWN STRL REUS W/TWL XL LVL3 (GOWN DISPOSABLE) IMPLANT
HEAD CERAMIC DELTA 28 P1.5 HIP (Head) ×2 IMPLANT
HOLDER FOLEY CATH W/STRAP (MISCELLANEOUS) ×2 IMPLANT
KIT TURNOVER KIT A (KITS) ×2 IMPLANT
LINER MARATHON 28 48 (Hips) ×2 IMPLANT
MANIFOLD NEPTUNE II (INSTRUMENTS) ×2 IMPLANT
PACK ANTERIOR HIP CUSTOM (KITS) ×2 IMPLANT
PENCIL SMOKE EVACUATOR COATED (MISCELLANEOUS) ×2 IMPLANT
STEM FEM ACTIS STD SZ4 (Stem) ×2 IMPLANT
STRIP CLOSURE SKIN 1/2X4 (GAUZE/BANDAGES/DRESSINGS) ×2 IMPLANT
SUT ETHIBOND NAB CT1 #1 30IN (SUTURE) ×2 IMPLANT
SUT MNCRL AB 4-0 PS2 18 (SUTURE) ×2 IMPLANT
SUT STRATAFIX 0 PDS 27 VIOLET (SUTURE) ×2
SUT VIC AB 2-0 CT1 27 (SUTURE) ×2
SUT VIC AB 2-0 CT1 TAPERPNT 27 (SUTURE) ×2 IMPLANT
SUTURE STRATFX 0 PDS 27 VIOLET (SUTURE) ×1 IMPLANT
SYR 50ML LL SCALE MARK (SYRINGE) IMPLANT
TRAY FOLEY MTR SLVR 16FR STAT (SET/KITS/TRAYS/PACK) ×2 IMPLANT
TUBE SUCTION HIGH CAP CLEAR NV (SUCTIONS) ×2 IMPLANT

## 2020-11-10 NOTE — Op Note (Addendum)
OPERATIVE REPORT- TOTAL HIP ARTHROPLASTY   PREOPERATIVE DIAGNOSIS: Osteoarthritis of the Right hip.   POSTOPERATIVE DIAGNOSIS: Osteoarthritis of the Right  hip.   PROCEDURE: Right total hip arthroplasty, anterior approach.   SURGEON: Gaynelle Arabian, MD   ASSISTANT: Fenton Foy, PA-C  ANESTHESIA:  Spinal  ESTIMATED BLOOD LOSS:-250 mL    DRAINS: Hemovac x1.   COMPLICATIONS: None   CONDITION: PACU - hemodynamically stable.   BRIEF CLINICAL NOTE: Courtney [name] is a 70 y.o. female who has advanced end-  stage arthritis of their Right  hip with progressively worsening pain and  dysfunction.The patient has failed nonoperative management and presents for  total hip arthroplasty.   PROCEDURE IN DETAIL: After successful administration of spinal  anesthetic, the traction boots for the [facility] bed were placed on both  feet and the patient was placed onto the [facility] bed, boots placed into the leg  holders. The Right hip was then isolated from the perineum with plastic  drapes and prepped and draped in the usual sterile fashion. ASIS and  greater trochanter were marked and a oblique incision was made, starting  at about 1 cm lateral and 2 cm distal to the ASIS and coursing towards  the anterior cortex of the femur. The skin was cut with a 10 blade  through subcutaneous tissue to the level of the fascia overlying the  tensor fascia lata muscle. The fascia was then incised in line with the  incision at the junction of the anterior third and posterior 2/3rd. The  muscle was teased off the fascia and then the interval between the TFL  and the rectus was developed. The Hohmann retractor was then placed at  the top of the femoral neck over the capsule. The vessels overlying the  capsule were cauterized and the fat on top of the capsule was removed.  A Hohmann retractor was then placed anterior underneath the rectus  femoris to give exposure to the entire anterior capsule. A T-shaped   capsulotomy was performed. The edges were tagged and the femoral head  was identified.       Osteophytes are removed off the superior acetabulum.  The femoral neck was then cut in situ with an oscillating saw. Traction  was then applied to the left lower extremity utilizing the [facility]  traction. The femoral head was then removed. Retractors were placed  around the acetabulum and then circumferential removal of the labrum was  performed. Osteophytes were also removed. Reaming starts at 45 mm to  medialize and  Increased in 2 mm increments to 47 mm. We reamed in  approximately 40 degrees of abduction, 20 degrees anteversion. A 48 mm  pinnacle acetabular shell was then impacted in anatomic position under  fluoroscopic guidance with excellent purchase. We did not need to place  any additional dome screws. A 28 mm neutral + 4 marathon liner was then  placed into the acetabular shell.       The femoral lift was then placed along the lateral aspect of the femur  just distal to the vastus ridge. The leg was  externally rotated and capsule  was stripped off the inferior aspect of the femoral neck down to the  level of the lesser trochanter, this was done with electrocautery. The femur was lifted after this was performed. The  leg was then placed in an extended and adducted position essentially delivering the femur. We also removed the capsule superiorly and the piriformis from the piriformis  fossa to gain excellent exposure of the  proximal femur. Rongeur was used to remove some cancellous bone to get  into the lateral portion of the proximal femur for placement of the  initial starter reamer. The starter broaches was placed  the starter broach  and was shown to go down the center of the canal. Broaching  with the Actis system was then performed starting at size 0  coursing  Up to size 4. A size 4 had excellent torsional and rotational  and axial stability. The trial standard offset neck was then  placed  with a 28 + 1.5 trial head. The hip was then reduced. We confirmed that  the stem was in the canal both on AP and lateral x-rays. It also has excellent sizing. The hip was reduced with outstanding stability through full extension and full external rotation.. AP pelvis was taken and the leg lengths were measured and found to be equal. Hip was then dislocated again and the femoral head and neck removed. The  femoral broach was removed. Size 4 Actis stem with a standard offset  neck was then impacted into the femur following native anteversion. Has  excellent purchase in the canal. Excellent torsional and rotational and  axial stability. It is confirmed to be in the canal on AP and lateral  fluoroscopic views. The 28 + 1.5 ceramic head was placed and the hip  reduced with outstanding stability. Again AP pelvis was taken and it  confirmed that the leg lengths were equal. The wound was then copiously  irrigated with saline solution and the capsule reattached and repaired  with Ethibond suture. 30 ml of .25% Bupivicaine was  injected into the capsule and into the edge of the tensor fascia lata as well as subcutaneous tissue. The fascia overlying the tensor fascia lata was then closed with a running #1 V-Loc. Subcu was closed with interrupted 2-0 Vicryl and subcuticular running 4-0 Monocryl. Incision was cleaned  and dried. Steri-Strips and a bulky sterile dressing applied.The patient was awakened and transported to recovery in stable condition.        Please note that a surgical assistant was a medical necessity for this procedure to perform it in a safe and expeditious manner. Assistant was necessary to provide appropriate retraction of vital neurovascular structures and to prevent femoral fracture and allow for anatomic placement of the prosthesis.  Gaynelle Arabian, M.D.

## 2020-11-10 NOTE — Anesthesia Postprocedure Evaluation (Signed)
Anesthesia Post Note  Patient: Courtney [name]  Procedure(s) Performed: TOTAL HIP ARTHROPLASTY ANTERIOR APPROACH (Right Hip)     Patient location during evaluation: PACU Anesthesia Type: Spinal Level of consciousness: oriented and awake and alert Pain management: pain level controlled Vital Signs Assessment: post-procedure vital signs reviewed and stable Respiratory status: spontaneous breathing, respiratory function stable and patient connected to nasal cannula oxygen Cardiovascular status: blood pressure returned to baseline and stable Postop Assessment: no headache, no backache and no apparent nausea or vomiting Anesthetic complications: no   No complications documented.  Last Vitals:  Vitals:   11/10/20 1200 11/10/20 1215  BP: (!) 109/58 (!) 113/92  Pulse: (!) 56 69  Resp: 11 16  Temp: 37 C 37 C  SpO2: 98% 97%    Last Pain:  Vitals:   11/10/20 1200  TempSrc:   PainSc: 0-No pain                 Sahid Borba S

## 2020-11-10 NOTE — Anesthesia Procedure Notes (Signed)
Spinal  Patient location during procedure: OR Start time: 11/10/2020 9:40 AM End time: 11/10/2020 9:45 AM Reason for block: surgical anesthesia Staffing Performed: anesthesiologist  Anesthesiologist: Myrtie Soman, MD Preanesthetic Checklist Completed: patient identified, IV checked, site marked, risks and benefits discussed, surgical consent, monitors and equipment checked, pre-op evaluation and timeout performed Spinal Block Patient position: sitting Prep: Betadine Patient monitoring: heart rate, continuous pulse ox and blood pressure Approach: midline Location: L2-3 Injection technique: single-shot Needle Needle type: Sprotte  Needle gauge: 24 G Needle length: 9 cm Assessment Sensory level: T6 Events: CSF return Additional Notes Expiration date of kit checked and confirmed. Patient tolerated procedure well, without complications.

## 2020-11-10 NOTE — Transfer of Care (Signed)
Immediate Anesthesia Transfer of Care Note  Patient: Courtney [name]  Procedure(s) Performed: TOTAL HIP ARTHROPLASTY ANTERIOR APPROACH (Right Hip)  Patient Location: PACU  Anesthesia Type:Spinal  Level of Consciousness: awake, alert , oriented and patient cooperative  Airway & Oxygen Therapy: Patient Spontanous Breathing and Patient connected to face mask  Post-op Assessment: Report given to RN and Post -op Vital signs reviewed and stable  Post vital signs: Reviewed and stable  Last Vitals:  Vitals Value Taken Time  BP 99/51 11/10/20 1122  Temp    Pulse 67 11/10/20 1123  Resp 11 11/10/20 1123  SpO2 100 % 11/10/20 1123  Vitals shown include unvalidated device data.  Last Pain:  Vitals:   11/10/20 0813  TempSrc:   PainSc: 0-No pain      Patients Stated Pain Goal: 3 (96/22/29 7989)  Complications: No complications documented.

## 2020-11-10 NOTE — Evaluation (Signed)
Physical Therapy Evaluation Patient Details Name: Courtney [name] MRN: [id number] DOB: 1951/04/14 Today's Date: 11/10/2020   History of Present Illness  Patient is 70 y.o. female s/p Rt THA anterior approach on HLD, hypothyroidism, fibromyalgia, depression, anxiety, asthma, ADHD, ACDF C5-7.    Clinical Impression  Courtney [name] is a 70 y.o. female POD 0 s/p Rt THA. Patient reports independence with mobility at baseline. Patient is now limited by functional impairments (see PT problem list below) and requires min guard/supervision for transfers and gait with RW. Patient was able to ambulate ~100 feet with RW and min guard/supervision and cues for safe walker management. Patient educated on safe sequencing for stair mobility and verbalized safe guarding position for people assisting with mobility. Patient instructed in exercises to facilitate ROM and circulation. Patient will benefit from continued skilled PT interventions to address impairments and progress towards PLOF. Patient has met mobility goals at adequate level for discharge home; will continue to follow if pt continues acute stay to progress towards Mod I goals.     Follow Up Recommendations Follow surgeon's recommendation for DC plan and follow-up therapies    Equipment Recommendations  None recommended by PT    Recommendations for Other Services       Precautions / Restrictions Precautions Precautions: Fall Restrictions Weight Bearing Restrictions: No Other Position/Activity Restrictions: WBAT      Mobility  Bed Mobility Overal bed mobility: Needs Assistance Bed Mobility: Supine to Sit     Supine to sit: Supervision     General bed mobility comments: p taking extra time to move to EOB, no assist needed.    Transfers Overall transfer level: Needs assistance Equipment used: Rolling walker (2 wheeled) Transfers: Sit to/from [facility] Sit to Stand: Supervision Stand pivot transfers:  Supervision       General transfer comment: cues for safe hand placement, no assist required to rise from EOB or BSC. Patient completed stand step transer chair<>BSC with supervision for safety.  Ambulation/Gait Ambulation/Gait assistance: Min guard;Supervision Gait Distance (Feet): 100 Feet Assistive device: Rolling walker (2 wheeled) Gait Pattern/deviations: Step-to pattern;Step-through pattern Gait velocity: decr   General Gait Details: cues at start for step to pattern progressing to step through. No overt LOB noted throughout and pt maintained safe proximity.  Stairs Stairs: Yes Stairs assistance: Supervision;Min guard Stair Management: One rail Left;Step to pattern;Forwards Number of Stairs: 3 General stair comments: cues for safe step sequencing "up with good, down with bad" no overt LOB noted. pt verbalized safe understanding of guarding/assist from family.  Wheelchair Mobility    Modified Rankin (Stroke Patients Only)       Balance Overall balance assessment: Mild deficits observed, not formally tested                                           Pertinent Vitals/Pain Pain Assessment: 0-10 Pain Score: 6  Pain Location: Rt hip Pain Descriptors / Indicators: Aching;Discomfort Pain Intervention(s): Limited activity within patient's tolerance;Repositioned;Monitored during session;Premedicated before session;Ice applied    Home Living Family/patient expects to be discharged to:: Private residence Living Arrangements: Spouse/significant other Available Help at Discharge: Family;Available PRN/intermittently Type of Home: House Home Access: Stairs to enter   [employer] of Steps: 3 in front Lt rail at side entrance Home Layout: One level;Laundry or work area in basement;Other (Comment) (stair lift to attic and basement) Home Equipment: Gilford Rile -  2 wheels;Adaptive equipment;Bedside commode;Cane - single point      Prior Function Level of  Independence: Independent with assistive device(s)               Hand Dominance   Dominant Hand: Right    Extremity/Trunk Assessment   Upper Extremity Assessment Upper Extremity Assessment: Overall WFL for tasks assessed    Lower Extremity Assessment Lower Extremity Assessment: Overall WFL for tasks assessed    Cervical / Trunk Assessment Cervical / Trunk Assessment: Normal  Communication   Communication: No difficulties  Cognition Arousal/Alertness: Awake/alert Behavior During Therapy: WFL for tasks assessed/performed Overall Cognitive Status: Within Functional Limits for tasks assessed                                        General Comments      Exercises Total Joint Exercises Ankle Circles/Pumps: AROM;Both;10 reps;Seated Quad Sets: AROM;5 reps;Seated;Right Short Arc Quad: AROM;Right;Other reps (comment);Seated (3) Heel Slides: AROM;AAROM;Right;Other reps (comment);Seated (3) Hip ABduction/ADduction: AROM;Right;Other reps (comment);Seated (1)   Assessment/Plan    PT Assessment Patient needs continued PT services  PT Problem List Decreased strength;Decreased balance;Decreased activity tolerance;Decreased range of motion;Decreased mobility;Decreased knowledge of use of DME;Decreased knowledge of precautions;Pain       PT Treatment Interventions DME instruction;Stair training;Gait training;Functional mobility training;Therapeutic activities;Therapeutic exercise;Balance training;Patient/family education    PT Goals (Current goals can be found in the Care Plan section)  Acute Rehab PT Goals Patient Stated Goal: get back to gardening PT Goal Formulation: With patient Time For Goal Achievement: 11/17/20 Potential to Achieve Goals: Good    Frequency 7X/week   Barriers to discharge        Co-evaluation               AM-PAC PT "6 Clicks" Mobility  Outcome Measure Help needed turning from your back to your side while in a flat bed  without using bedrails?: A Little Help needed moving from lying on your back to sitting on the side of a flat bed without using bedrails?: A Little Help needed moving to and from a bed to a chair (including a wheelchair)?: A Little Help needed standing up from a chair using your arms (e.g., wheelchair or bedside chair)?: A Little Help needed to walk in hospital room?: A Little Help needed climbing 3-5 steps with a railing? : A Little 6 Click Score: 18    End of Session Equipment Utilized During Treatment: Gait belt Activity Tolerance: Patient tolerated treatment well Patient left: in chair;with call bell/phone within reach Nurse Communication: Mobility status;Patient requests pain meds PT Visit Diagnosis: Muscle weakness (generalized) (M62.81);Difficulty in walking, not elsewhere classified (R26.2)    Time: 5035-4656 PT Time Calculation (min) (ACUTE ONLY): 39 min   Charges:   PT Evaluation $PT Eval Low Complexity: 1 Low PT Treatments $Gait Training: 8-22 mins $Therapeutic Exercise: 8-22 mins        Verner Mould, DPT Acute Rehabilitation Services Office [phone] Pager [phone]    Jacques Navy 11/10/2020, 3:25 PM

## 2020-11-10 NOTE — Discharge Instructions (Signed)
Courtney Arabian, MD Total Joint Specialist [facility] [address] [place], [place] [address] [phone]  ANTERIOR APPROACH TOTAL HIP REPLACEMENT POSTOPERATIVE DIRECTIONS     Hip Rehabilitation, Guidelines Following Surgery  The results of a hip operation are greatly improved after range of motion and muscle strengthening exercises. Follow all safety measures which are given to protect your hip. If any of these exercises cause increased pain or swelling in your joint, decrease the amount until you are comfortable again. Then slowly increase the exercises. Call your caregiver if you have problems or questions.   BLOOD CLOT PREVENTION . Take a 325 mg Aspirin two times a day for three weeks following surgery. Then take an 81 mg Aspirin once a day for three weeks. Then discontinue Aspirin. Dennis Bast may resume your vitamins/supplements upon discharge from the hospital. . Do not take any NSAIDs (Advil, Aleve, Ibuprofen, Meloxicam, etc.) until you have discontinued the 325 mg Aspirin.  HOME CARE INSTRUCTIONS  . Remove items at home which could result in a fall. This includes throw rugs or furniture in walking pathways.   ICE to the affected hip as frequently as 20-30 minutes an hour and then as needed for pain and swelling. Continue to use ice on the hip for pain and swelling from surgery. You may notice swelling that will progress down to the foot and ankle. This is normal after surgery. Elevate the leg when you are not up walking on it.    Continue to use the breathing machine which will help keep your temperature down.  It is common for your temperature to cycle up and down following surgery, especially at night when you are not up moving around and exerting yourself.  The breathing machine keeps your lungs expanded and your temperature down.  DIET You may resume your previous home diet once your are discharged from the hospital.  DRESSING / WOUND CARE /  SHOWERING . You have an adhesive waterproof bandage over the incision. Leave this in place until your first follow-up appointment. Once you remove this you will not need to place another bandage.  . You may begin showering 3 days following surgery, but do not submerge the incision under water.  ACTIVITY . For the first 3-5 days, it is important to rest and keep the operative leg elevated. You should, as a general rule, rest for 50 minutes and walk/stretch for 10 minutes per hour. After 5 days, you may slowly increase activity as tolerated.  Marland Kitchen Perform the exercises you were provided twice a day for about 15-20 minutes each session. Begin these 2 days following surgery. . Walk with your walker as instructed. Use the walker until you are comfortable transitioning to a cane. Walk with the cane in the opposite hand of the operative leg. You may discontinue the cane once you are comfortable and walking steadily. . Avoid periods of inactivity such as sitting longer than an hour when not asleep. This helps prevent blood clots.  . Do not drive a car for 6 weeks or until released by your surgeon.  . Do not drive while taking narcotics.  TED HOSE STOCKINGS Wear the elastic stockings on both legs for three weeks following surgery during the day. You may remove them at night while sleeping.  WEIGHT BEARING Weight bearing as tolerated with assist device (walker, cane, etc) as directed, use it as long as suggested by your surgeon or therapist, typically at least 4-6 weeks.  POSTOPERATIVE CONSTIPATION PROTOCOL Constipation -  defined medically as fewer than three stools per week and severe constipation as less than one stool per week.  One of the most common issues patients have following surgery is constipation.  Even if you have a regular bowel pattern at home, your normal regimen is likely to be disrupted due to multiple reasons following surgery.  Combination of anesthesia, postoperative narcotics, change in  appetite and fluid intake all can affect your bowels.  In order to avoid complications following surgery, here are some recommendations in order to help you during your recovery period.  . Colace (docusate) - Pick up an over-the-counter form of Colace or another stool softener and take twice a day as long as you are requiring postoperative pain medications.  Take with a full glass of water daily.  If you experience loose stools or diarrhea, hold the colace until you stool forms back up.  If your symptoms do not get better within 1 week or if they get worse, check with your doctor. . Dulcolax (bisacodyl) - Pick up over-the-counter and take as directed by the product packaging as needed to assist with the movement of your bowels.  Take with a full glass of water.  Use this product as needed if not relieved by Colace only.  . MiraLax (polyethylene glycol) - Pick up over-the-counter to have on hand.  MiraLax is a solution that will increase the amount of water in your bowels to assist with bowel movements.  Take as directed and can mix with a glass of water, juice, soda, coffee, or tea.  Take if you go more than two days without a movement.Do not use MiraLax more than once per day. Call your doctor if you are still constipated or irregular after using this medication for 7 days in a row.  If you continue to have problems with postoperative constipation, please contact the office for further assistance and recommendations.  If you experience "the worst abdominal pain ever" or develop nausea or vomiting, please contact the office immediatly for further recommendations for treatment.  ITCHING  If you experience itching with your medications, try taking only a single pain pill, or even half a pain pill at a time.  You can also use Benadryl over the counter for itching or also to help with sleep.   MEDICATIONS See your medication summary on the "After Visit Summary" that the nursing staff will review with you  prior to discharge.  You may have some home medications which will be placed on hold until you complete the course of blood thinner medication.  It is important for you to complete the blood thinner medication as prescribed by your surgeon.  Continue your approved medications as instructed at time of discharge.  PRECAUTIONS If you experience chest pain or shortness of breath - call 911 immediately for transfer to the hospital emergency department.  If you develop a fever greater that 101 F, purulent drainage from wound, increased redness or drainage from wound, foul odor from the wound/dressing, or calf pain - CONTACT YOUR SURGEON.                                                   FOLLOW-UP APPOINTMENTS Make sure you keep all of your appointments after your operation with your surgeon and caregivers. You should call the office at the above phone number and  make an appointment for approximately two weeks after the date of your surgery or on the date instructed by your surgeon outlined in the "After Visit Summary".  RANGE OF MOTION AND STRENGTHENING EXERCISES  These exercises are designed to help you keep full movement of your hip joint. Follow your caregiver's or physical therapist's instructions. Perform all exercises about fifteen times, three times per day or as directed. Exercise both hips, even if you have had only one joint replacement. These exercises can be done on a training (exercise) mat, on the floor, on a table or on a bed. Use whatever works the best and is most comfortable for you. Use music or television while you are exercising so that the exercises are a pleasant break in your day. This will make your life better with the exercises acting as a break in routine you can look forward to.  . Lying on your back, slowly slide your foot toward your buttocks, raising your knee up off the floor. Then slowly slide your foot back down until your leg is straight again.  . Lying on your back spread  your legs as far apart as you can without causing discomfort.  . Lying on your side, raise your upper leg and foot straight up from the floor as far as is comfortable. Slowly lower the leg and repeat.  . Lying on your back, tighten up the muscle in the front of your thigh (quadriceps muscles). You can do this by keeping your leg straight and trying to raise your heel off the floor. This helps strengthen the largest muscle supporting your knee.  . Lying on your back, tighten up the muscles of your buttocks both with the legs straight and with the knee bent at a comfortable angle while keeping your heel on the floor.   POST-OPERATIVE OPIOID TAPER INSTRUCTIONS: . It is important to wean off of your opioid medication as soon as possible. If you do not need pain medication after your surgery it is ok to stop day one. Marland Kitchen Opioids include: o Codeine, Hydrocodone(Norco, Vicodin), Oxycodone(Percocet, oxycontin) and hydromorphone amongst others.  . Long term and even short term use of opiods can cause: o Increased pain response o Dependence o Constipation o Depression o Respiratory depression o And more.  . Withdrawal symptoms can include o Flu like symptoms o Nausea, vomiting o And more . Techniques to manage these symptoms o Hydrate well o Eat regular healthy meals o Stay active o Use relaxation techniques(deep breathing, meditating, yoga) . Do Not substitute Alcohol to help with tapering . If you have been on opioids for less than two weeks and do not have pain than it is ok to stop all together.  . Plan to wean off of opioids o This plan should start within one week post op of your joint replacement. o Maintain the same interval or time between taking each dose and first decrease the dose.  o Cut the total daily intake of opioids by one tablet each day o Next start to increase the time between doses. o The last dose that should be eliminated is the evening dose.     IF YOU ARE TRANSFERRED  TO A SKILLED REHAB FACILITY If the patient is transferred to a skilled rehab facility following release from the hospital, a list of the current medications will be sent to the facility for the patient to continue.  When discharged from the skilled rehab facility, please have the facility set up the patient's Home  Health Physical Therapy prior to being released. Also, the skilled facility will be responsible for providing the patient with their medications at time of release from the facility to include their pain medication, the muscle relaxants, and their blood thinner medication. If the patient is still at the rehab facility at time of the two week follow up appointment, the skilled rehab facility will also need to assist the patient in arranging follow up appointment in our office and any transportation needs.  MAKE SURE YOU:  . Understand these instructions.  . Get help right away if you are not doing well or get worse.    DENTAL ANTIBIOTICS:  In most cases prophylactic antibiotics for Dental procdeures after total joint surgery are not necessary.  Exceptions are as follows:  1. History of prior total joint infection  2. Severely immunocompromised (Organ Transplant, cancer chemotherapy, Rheumatoid biologic meds such as [place])  3. Poorly controlled diabetes (A1C &gt; 8.0, blood glucose over 200)  If you have one of these conditions, contact your surgeon for an antibiotic prescription, prior to your dental procedure.    Pick up stool softner and laxative for home use following surgery while on pain medications. Do not submerge incision under water. Please use good hand washing techniques while changing dressing each day. May shower starting three days after surgery. Please use a clean towel to pat the incision dry following showers. Continue to use ice for pain and swelling after surgery. Do not use any lotions or creams on the incision until instructed by your surgeon.

## 2020-11-10 NOTE — Interval H&P Note (Signed)
History and Physical Interval Note:  11/10/2020 8:35 AM  Courtney [name]  has presented today for surgery, with the diagnosis of right hip dysplasia.  The various methods of treatment have been discussed with the patient and family. After consideration of risks, benefits and other options for treatment, the patient has consented to  Procedure(s) with comments: [place] (Right) - 190min as a surgical intervention.  The patient's history has been reviewed, patient examined, no change in status, stable for surgery.  I have reviewed the patient's chart and labs.  Questions were answered to the patient's satisfaction.     [name]

## 2020-11-11 ENCOUNTER — Encounter (HOSPITAL_COMMUNITY): Payer: Self-pay | Admitting: Orthopedic Surgery

## 2020-12-14 DIAGNOSIS — Z96641 Presence of right artificial hip joint: Secondary | ICD-10-CM | POA: Diagnosis not present

## 2020-12-14 DIAGNOSIS — M5416 Radiculopathy, lumbar region: Secondary | ICD-10-CM | POA: Diagnosis not present

## 2020-12-28 DIAGNOSIS — L821 Other seborrheic keratosis: Secondary | ICD-10-CM | POA: Diagnosis not present

## 2020-12-28 DIAGNOSIS — D225 Melanocytic nevi of trunk: Secondary | ICD-10-CM | POA: Diagnosis not present

## 2020-12-28 DIAGNOSIS — L814 Other melanin hyperpigmentation: Secondary | ICD-10-CM | POA: Diagnosis not present

## 2020-12-28 DIAGNOSIS — L578 Other skin changes due to chronic exposure to nonionizing radiation: Secondary | ICD-10-CM | POA: Diagnosis not present

## 2020-12-28 DIAGNOSIS — R202 Paresthesia of skin: Secondary | ICD-10-CM | POA: Diagnosis not present

## 2020-12-28 DIAGNOSIS — L659 Nonscarring hair loss, unspecified: Secondary | ICD-10-CM | POA: Diagnosis not present

## 2021-01-19 DIAGNOSIS — M25551 Pain in right hip: Secondary | ICD-10-CM | POA: Diagnosis not present

## 2021-01-31 DIAGNOSIS — M7062 Trochanteric bursitis, left hip: Secondary | ICD-10-CM | POA: Diagnosis not present

## 2021-02-01 DIAGNOSIS — H04123 Dry eye syndrome of bilateral lacrimal glands: Secondary | ICD-10-CM | POA: Diagnosis not present

## 2021-02-01 DIAGNOSIS — H524 Presbyopia: Secondary | ICD-10-CM | POA: Diagnosis not present

## 2021-02-01 DIAGNOSIS — H2513 Age-related nuclear cataract, bilateral: Secondary | ICD-10-CM | POA: Diagnosis not present

## 2021-02-01 DIAGNOSIS — H5203 Hypermetropia, bilateral: Secondary | ICD-10-CM | POA: Diagnosis not present

## 2021-02-03 DIAGNOSIS — M25551 Pain in right hip: Secondary | ICD-10-CM | POA: Diagnosis not present

## 2021-02-08 DIAGNOSIS — M25551 Pain in right hip: Secondary | ICD-10-CM | POA: Diagnosis not present

## 2021-02-11 DIAGNOSIS — M25551 Pain in right hip: Secondary | ICD-10-CM | POA: Diagnosis not present

## 2021-02-11 DIAGNOSIS — M25552 Pain in left hip: Secondary | ICD-10-CM | POA: Diagnosis not present

## 2021-02-15 DIAGNOSIS — M25551 Pain in right hip: Secondary | ICD-10-CM | POA: Diagnosis not present

## 2021-02-15 DIAGNOSIS — M25552 Pain in left hip: Secondary | ICD-10-CM | POA: Diagnosis not present

## 2021-02-18 DIAGNOSIS — M25552 Pain in left hip: Secondary | ICD-10-CM | POA: Diagnosis not present

## 2021-02-18 DIAGNOSIS — M25551 Pain in right hip: Secondary | ICD-10-CM | POA: Diagnosis not present

## 2021-02-22 DIAGNOSIS — M5416 Radiculopathy, lumbar region: Secondary | ICD-10-CM | POA: Diagnosis not present

## 2021-02-23 DIAGNOSIS — M25552 Pain in left hip: Secondary | ICD-10-CM | POA: Diagnosis not present

## 2021-02-23 DIAGNOSIS — M25551 Pain in right hip: Secondary | ICD-10-CM | POA: Diagnosis not present

## 2021-03-11 DIAGNOSIS — M25552 Pain in left hip: Secondary | ICD-10-CM | POA: Diagnosis not present

## 2021-03-11 DIAGNOSIS — M25551 Pain in right hip: Secondary | ICD-10-CM | POA: Diagnosis not present

## 2021-03-15 DIAGNOSIS — M5451 Vertebrogenic low back pain: Secondary | ICD-10-CM | POA: Diagnosis not present

## 2021-03-22 DIAGNOSIS — M25552 Pain in left hip: Secondary | ICD-10-CM | POA: Diagnosis not present

## 2021-03-22 DIAGNOSIS — M25551 Pain in right hip: Secondary | ICD-10-CM | POA: Diagnosis not present

## 2021-03-23 DIAGNOSIS — M533 Sacrococcygeal disorders, not elsewhere classified: Secondary | ICD-10-CM | POA: Diagnosis not present

## 2021-03-24 DIAGNOSIS — M25552 Pain in left hip: Secondary | ICD-10-CM | POA: Diagnosis not present

## 2021-03-24 DIAGNOSIS — M25551 Pain in right hip: Secondary | ICD-10-CM | POA: Diagnosis not present

## 2021-03-29 DIAGNOSIS — M533 Sacrococcygeal disorders, not elsewhere classified: Secondary | ICD-10-CM | POA: Diagnosis not present

## 2021-04-07 DIAGNOSIS — M25551 Pain in right hip: Secondary | ICD-10-CM | POA: Diagnosis not present

## 2021-04-13 DIAGNOSIS — R413 Other amnesia: Secondary | ICD-10-CM | POA: Diagnosis not present

## 2021-04-13 DIAGNOSIS — I700 Atherosclerosis of aorta: Secondary | ICD-10-CM | POA: Diagnosis not present

## 2021-04-13 DIAGNOSIS — G4733 Obstructive sleep apnea (adult) (pediatric): Secondary | ICD-10-CM | POA: Diagnosis not present

## 2021-04-13 DIAGNOSIS — R7303 Prediabetes: Secondary | ICD-10-CM | POA: Diagnosis not present

## 2021-04-13 DIAGNOSIS — E7800 Pure hypercholesterolemia, unspecified: Secondary | ICD-10-CM | POA: Diagnosis not present

## 2021-04-13 DIAGNOSIS — E039 Hypothyroidism, unspecified: Secondary | ICD-10-CM | POA: Diagnosis not present

## 2021-04-13 DIAGNOSIS — Q231 Congenital insufficiency of aortic valve: Secondary | ICD-10-CM | POA: Diagnosis not present

## 2021-04-14 DIAGNOSIS — M25551 Pain in right hip: Secondary | ICD-10-CM | POA: Diagnosis not present

## 2021-04-14 DIAGNOSIS — Z96641 Presence of right artificial hip joint: Secondary | ICD-10-CM | POA: Diagnosis not present

## 2021-04-14 DIAGNOSIS — M25562 Pain in left knee: Secondary | ICD-10-CM | POA: Diagnosis not present

## 2021-04-14 DIAGNOSIS — M1712 Unilateral primary osteoarthritis, left knee: Secondary | ICD-10-CM | POA: Diagnosis not present

## 2021-04-26 DIAGNOSIS — M5451 Vertebrogenic low back pain: Secondary | ICD-10-CM | POA: Diagnosis not present

## 2021-05-03 DIAGNOSIS — R0683 Snoring: Secondary | ICD-10-CM | POA: Diagnosis not present

## 2021-05-03 DIAGNOSIS — K219 Gastro-esophageal reflux disease without esophagitis: Secondary | ICD-10-CM | POA: Diagnosis not present

## 2021-05-11 DIAGNOSIS — M7071 Other bursitis of hip, right hip: Secondary | ICD-10-CM | POA: Diagnosis not present

## 2021-05-25 DIAGNOSIS — M533 Sacrococcygeal disorders, not elsewhere classified: Secondary | ICD-10-CM | POA: Diagnosis not present

## 2021-06-21 DIAGNOSIS — M5137 Other intervertebral disc degeneration, lumbosacral region: Secondary | ICD-10-CM | POA: Diagnosis not present

## 2021-06-22 DIAGNOSIS — M5137 Other intervertebral disc degeneration, lumbosacral region: Secondary | ICD-10-CM | POA: Diagnosis not present

## 2021-08-05 DIAGNOSIS — M5416 Radiculopathy, lumbar region: Secondary | ICD-10-CM | POA: Diagnosis not present

## 2021-08-05 DIAGNOSIS — G894 Chronic pain syndrome: Secondary | ICD-10-CM | POA: Diagnosis not present

## 2021-08-12 ENCOUNTER — Other Ambulatory Visit: Payer: Self-pay | Admitting: Family Medicine

## 2021-08-12 DIAGNOSIS — Z1231 Encounter for screening mammogram for malignant neoplasm of breast: Secondary | ICD-10-CM

## 2021-08-17 DIAGNOSIS — M7071 Other bursitis of hip, right hip: Secondary | ICD-10-CM | POA: Diagnosis not present

## 2021-08-21 DIAGNOSIS — M546 Pain in thoracic spine: Secondary | ICD-10-CM | POA: Diagnosis not present

## 2021-08-23 ENCOUNTER — Telehealth: Payer: Self-pay

## 2021-08-23 NOTE — Telephone Encounter (Signed)
? ?  Pre-operative Risk Assessment  ?  ?Patient Name: Courtney [name]  ?DOB: [birth year] ?MRN: [id number]  ? ?  ? ?Request for Surgical Clearance   ? ?Procedure:   Left Sl Fusion, SCS Placement  ? ?Date of Surgery:  TBD                       ?   ?Surgeon:  Dr. Melina Schools ?Surgeon's Group or Practice Name:  [facility]  ?Phone number:  [phone] ?Fax number:  [phone] ?  ?Type of Clearance Requested:   ?- Medical  ?  ?Type of Anesthesia:  General  ?  ?Additional requests/questions:   ? ? ?

## 2021-08-23 NOTE — Telephone Encounter (Signed)
? ?  Name: Courtney [name]  ?DOB: 07-Feb-1951  ?MRN: [id number] ? ?Primary Cardiologist: [name], MD ? ?Chart reviewed as part of pre-operative protocol coverage. Because of Sonny Poth Titzer's past medical history and time since last visit, she will require a follow-up visit in order to better assess preoperative cardiovascular risk. ? ?Pre-op covering staff: ?- Please schedule appointment and call patient to inform them. If patient already had an upcoming appointment within acceptable timeframe, please add "pre-op clearance" to the appointment notes so provider is aware. ?- Please contact requesting surgeon's office via preferred method (i.e, phone, fax) to inform them of need for appointment prior to surgery. ? ?If applicable, this message will also be routed to pharmacy pool and/or primary cardiologist for input on holding anticoagulant/antiplatelet agent as requested below so that this information is available to the clearing provider at time of patient's appointment.  ? ?Almyra Deforest, [place]  ?08/23/2021, 6:34 PM  ? ?

## 2021-08-24 ENCOUNTER — Ambulatory Visit
Admission: RE | Admit: 2021-08-24 | Discharge: 2021-08-24 | Disposition: A | Discharge status: Home / Self Care | Payer: Medicare Other | Source: Ambulatory Visit | Attending: Family Medicine | Admitting: Family Medicine

## 2021-08-24 ENCOUNTER — Other Ambulatory Visit: Payer: Self-pay

## 2021-08-24 DIAGNOSIS — Z1231 Encounter for screening mammogram for malignant neoplasm of breast: Secondary | ICD-10-CM | POA: Diagnosis not present

## 2021-08-24 NOTE — Telephone Encounter (Signed)
1st attempt to reach pt regarding surgical clearance and the need for an appointmetn.  Per verbal permission on pt's voicemail, left a detailed message that she has been scheduled for an appointment, 08/31/21 10:15, asked her to call and confirm she received this and this appointment is ok. ?

## 2021-08-25 ENCOUNTER — Other Ambulatory Visit: Payer: Self-pay | Admitting: Family Medicine

## 2021-08-25 DIAGNOSIS — N3281 Overactive bladder: Secondary | ICD-10-CM | POA: Diagnosis not present

## 2021-08-30 NOTE — Telephone Encounter (Signed)
Appt with Melina Copa, [facility] 09/02/21. Will forward to [facility] for appt. Will send FYI to requesting office pt has appt ?

## 2021-08-31 ENCOUNTER — Encounter: Payer: Self-pay | Admitting: Physician Assistant

## 2021-08-31 ENCOUNTER — Ambulatory Visit: Payer: Medicare Other | Admitting: Nurse Practitioner

## 2021-08-31 NOTE — Progress Notes (Signed)
? ? ?Office Visit  ?  ?Patient Name: Courtney [name] ?Date of Encounter: 09/02/2021 ? ?Primary Care Provider:  Kelton Pillar, MD ?Primary Cardiologist:  [name], MD ?Primary Electrophysiologist: None ?Chief Complaint  ?  ?Courtney [name] is a 71 y.o. female who presents today for preoperative clearance.   ? ?Patient Profile: ?Bicuspid aortic valve ?Mitral regurgitation ?Asthma ?GERD ?Hyperlipidemia ?ADHD ?Thyroid nodule ?Former smoker ?OSA (Not using CPAP) ? ?Prior CV Studies: ?5/21- CT Angio of Chest with findings of no aortopathy and no coronary calcifications ?5/21-2D echo with EF 60-65%, grade 2 DD, mild mitral valve regurg, bicuspid aortic valve with mild sclerosis/calcification ?8/20-LE vascular ultrasound, right LE, ABI 1.19, left LE ABI 1.27, no evidence of LE arterial occlusion. ? ?History of Present Illness  ?  ?Courtney [name] is a 71 y.o. female with the above problem list was last seen 3/22 by Dr. Acie Fredrickson. She presents today for preoperative evaluation at the request of Dr. Rolena Infante at [facility]. She established care in 2021 for evaluation of heart murmur. Echo 5/21 showed EF 60-65%, grade 2 DD, mild-moderate mitral valve regurg, bicuspid aortic valve with mild sclerosis/calcification. CTA chest 5/21 with no evidence of aortopathy and no coronary calcifications. ? ?Since last being seen in our clinic Courtney [name] reports doing well.  She presents to clinic today for preoperative evaluation for implantation of pain stimulator.  She has strong history of lower back and spinal issues.  She states that currently she is unable to be as physically active due to chronic numbness in her right leg.  She is however able to complete 4 METS of activity and reports no cardiac related issues. Today she denies chest pain,dyspnea, PND, orthopnea, nausea, vomiting, dizziness, syncope, edema, weight gain, or early satiety.  She occasionally has a palpitation here and there but nothing sustained.  Her blood  pressure is well controlled today at 128/70 with a heart rate of 72.  Currently she is not compliant with her CPAP due to maintenance and uncomfortable apparatus.  We discussed the importance of using CPAP to prevent further damage of her mitral valve or aortic valve. ? ?Labs Reviewed: ?5/22 Potassium 5.4  ?WBC>3.7 ?Hgb>11.4 ?Platelets>162 ? ?11/22: ?Potassium>5.3 ?TSH>1.62 ?LDL>90 ?OZDG>644 ?HDL>56 ?HGBA1C>5.7 ?Creatinine>0.83 ?BUN>25 ? ?Past Medical History  ?  ?Past Medical History:  ?Diagnosis Date  ? ADHD   ? ADD  ? Allergic to pets   ? pet dander  ? Allergies   ? Anemia   ? take iron  ? Anxiety   ? Arthritis   ? hands, hip, shoulders back,  ? Asthma   ? as a child  ? Bicuspid aortic valve   ? bicuspid AV with no stenosis or regurgitation 01/2015 echo   ? Complication of anesthesia   ? slow to wake up and lingers for a long time  ? Concussion   ? Depression   ? Family history of adverse reaction to anesthesia   ? mom had skin reaction to anesthesia but cant remember what it was or exactly what happened  ? Fatigue   ? Fibromyalgia   ? Hyperlipidemia   ? Hypothyroidism   ? Irregular periods   ? Joint pain   ? Joint stiffness   ? Lower back pain   ? Menopause   ? Migraines   ? no longer  ? Mitral regurgitation   ? Numbness and tingling   ? Pollen allergy   ? Presence of pessary   ? Ringing in ears   ?  Sleep apnea   ? does not wear CPAP tapes mouth at night  ? Snoring   ? ?Past Surgical History:  ?Procedure Laterality Date  ? ABDOMINAL EXPOSURE N/A 04/12/2020  ? Procedure: ABDOMINAL EXPOSURE;  Surgeon: [name], MD;  Location: [place];  Service: Vascular;  Laterality: N/A;  ? ABDOMINOPLASTY    ? ANTERIOR CERVICAL DECOMP/DISCECTOMY FUSION N/A 06/28/2016  ? Procedure: ACDF C5-7 ANTERIOR CERVICAL DECOMPRESSION/DISCECTOMY FUSION 2 LEVELS;  Surgeon: Melina Schools, MD;  Location: [place];  Service: Orthopedics;  Laterality: N/A;  Requests 3 hrs  ? ANTERIOR LUMBAR FUSION N/A 04/12/2020  ? Procedure: Anterior Lumbar  Interbody Fusion - Lumbar four-Lumbar five , posterior instrumented fusion Lumbar four-five lumbar three -four;  Surgeon: Eustace Moore, MD;  Location: [place];  Service: Neurosurgery;  Laterality: N/A;  ? CARPAL TUNNEL RELEASE Bilateral 1996  ? COLONOSCOPY    ? DENTAL SURGERY    ? HERNIA REPAIR    ? times 2  ? LAMINECTOMY WITH POSTERIOR LATERAL ARTHRODESIS LEVEL 1 N/A 04/12/2020  ? Procedure: Posterior Instrumented Fusion Lumbar three to lumbar five;  Surgeon: Eustace Moore, MD;  Location: [place];  Service: Neurosurgery;  Laterality: N/A;  ? LUMBAR LAMINECTOMY/DECOMPRESSION MICRODISCECTOMY  04/21/2015  ? Procedure: DECOMPRESSION LUMBAR THREE-LUMBAR FOUR;  Surgeon: Melina Schools, MD;  Location: [place];  Service: Orthopedics;;  ? LUMBAR LAMINECTOMY/DECOMPRESSION MICRODISCECTOMY N/A 06/20/2017  ? Procedure: L4-5 decompression, L5-S1 left laminotomy/foraminotomy;  Surgeon: Melina Schools, MD;  Location: [place];  Service: Orthopedics;  Laterality: N/A;  3 hrs  ? PARATHYROIDECTOMY  2012  ? SACROILIAC JOINT FUSION Right 07/24/2018  ? Procedure: SACROILIAC JOINT FUSION;  Surgeon: Melina Schools, MD;  Location: [place];  Service: Orthopedics;  Laterality: Right;  90 mins  ? SHOULDER ARTHROSCOPY W/ ACROMIAL REPAIR Right   ? TONSILLECTOMY    ? TOTAL HIP ARTHROPLASTY Right 11/10/2020  ? Procedure: TOTAL HIP ARTHROPLASTY ANTERIOR APPROACH;  Surgeon: Gaynelle Arabian, MD;  Location: [facility];  Service: Orthopedics;  Laterality: Right;  165mn  ? ? ?Allergies ? ?Allergies  ?Allergen Reactions  ? Fish Allergy Anaphylaxis  ?  Can tolerate shellfish. Allergic to fish with shells and fins.  ? Peanuts [Peanut Oil] Anaphylaxis  ?  Can eat cashews, pistachios, and almonds  ? ? ?Home Medications  ?  ?Current Outpatient Medications  ?Medication Sig Dispense Refill  ? acetaminophen (TYLENOL) 500 MG tablet Take 500 mg by mouth in the morning and at bedtime.    ? Artificial Tear Solution (GENTEAL TEARS OP) Place 1 drop into both eyes daily as needed (dry  eyes).    ? Ascorbic Acid (VITAMIN C) 1000 MG tablet Take 1,000 mg by mouth in the morning and at bedtime.    ? atorvastatin (LIPITOR) 10 MG tablet Take 10 mg by mouth every 3 (three) days. In the evening    ? CALCIUM CITRATE PO Take 1,000 mg by mouth daily.    ? Carboxymethylcellul-Glycerin (REFRESH OPTIVE OP) Place 1 drop into both eyes daily as needed (dry eyes).    ? cholecalciferol (VITAMIN D3) 25 MCG (1000 UNIT) tablet Take 1,000 Units by mouth 2 (two) times daily.    ? clobetasol ointment (TEMOVATE) 00.27% Apply 1 application topically every 30 (thirty) days.    ? Coenzyme Q10 300 MG CAPS Take 300 mg by mouth daily.     ? docusate sodium (COLACE) 100 MG capsule Take 100 mg by mouth every evening.    ? DULoxetine (CYMBALTA) 30 MG capsule  Take 30 mg by mouth daily after breakfast.     ? estradiol (ESTRACE) 0.1 MG/GM vaginal cream Place 0.1 g vaginally every 3 (three) days.     ? FERROUS BISGLYCINATE CHELATE PO Take 25 mg by mouth daily.     ? fluticasone (FLONASE) 50 MCG/ACT nasal spray Place 1 spray into both nostrils at bedtime.     ? lactobacillus acidophilus (BACID) TABS tablet Take 1 tablet by mouth daily.    ? levothyroxine (SYNTHROID, LEVOTHROID) 25 MCG tablet Take 1 1/2 tablets daily (Patient taking differently: Take 37.5 mcg by mouth daily before breakfast. Take 1 1/2 tablets daily) 130 tablet 2  ? Lidocaine 4 % PTCH Apply 1 patch topically daily as needed (pain).     ? loratadine (CLARITIN) 10 MG tablet Take 10 mg by mouth daily as needed (for seasonal allergies (Spring/Summer)).     ? MAGNESIUM CITRATE PO Take 1,000 mg by mouth 2 (two) times daily.     ? methocarbamol (ROBAXIN) 500 MG tablet Take 1 tablet (500 mg total) by mouth every 8 (eight) hours as needed for muscle spasms. 50 tablet 1  ? Multiple Vitamins-Minerals (MULTIVITAMIN WITH MINERALS) tablet Take 1 tablet by mouth every other day.     ? Omega-3 Fatty Acids (OMEGA 3 PO) Take 2,560 mg by mouth at bedtime.     ? Oxycodone HCl 10 MG TABS  Take 5-7.5 mg by mouth 4 (four) times daily as needed (pain).    ? Psyllium Husk POWD Take 1 capsule by mouth 2 (two) times daily.    ? traZODone (DESYREL) 100 MG tablet Take 30-50 mg by mouth at bedtime.

## 2021-09-01 DIAGNOSIS — M5416 Radiculopathy, lumbar region: Secondary | ICD-10-CM | POA: Diagnosis not present

## 2021-09-02 ENCOUNTER — Other Ambulatory Visit: Payer: Self-pay

## 2021-09-02 ENCOUNTER — Encounter: Payer: Self-pay | Admitting: Nurse Practitioner

## 2021-09-02 ENCOUNTER — Ambulatory Visit (INDEPENDENT_AMBULATORY_CARE_PROVIDER_SITE_OTHER): Payer: Medicare Other | Admitting: Nurse Practitioner

## 2021-09-02 VITALS — BP 128/78 | HR 72 | Ht 63.5 in | Wt 138.0 lb

## 2021-09-02 DIAGNOSIS — Z01810 Encounter for preprocedural cardiovascular examination: Secondary | ICD-10-CM | POA: Diagnosis not present

## 2021-09-02 DIAGNOSIS — I340 Nonrheumatic mitral (valve) insufficiency: Secondary | ICD-10-CM | POA: Diagnosis not present

## 2021-09-02 DIAGNOSIS — Q231 Congenital insufficiency of aortic valve: Secondary | ICD-10-CM

## 2021-09-02 DIAGNOSIS — E875 Hyperkalemia: Secondary | ICD-10-CM

## 2021-09-02 DIAGNOSIS — E785 Hyperlipidemia, unspecified: Secondary | ICD-10-CM

## 2021-09-02 DIAGNOSIS — Q2381 Bicuspid aortic valve: Secondary | ICD-10-CM

## 2021-09-02 DIAGNOSIS — G4733 Obstructive sleep apnea (adult) (pediatric): Secondary | ICD-10-CM

## 2021-09-02 LAB — BASIC METABOLIC PANEL
BUN/Creatinine Ratio: 26 (ref 12–28)
BUN: 24 mg/dL (ref 8–27)
CO2: 28 mmol/L (ref 20–29)
Calcium: 10.3 mg/dL (ref 8.7–10.3)
Chloride: 99 mmol/L (ref 96–106)
Creatinine, Ser: 0.91 mg/dL (ref 0.57–1.00)
Glucose: 86 mg/dL (ref 70–99)
Potassium: 4.3 mmol/L (ref 3.5–5.2)
Sodium: 138 mmol/L (ref 134–144)
eGFR: 68 mL/min/{1.73_m2} (ref >59)

## 2021-09-02 NOTE — Patient Instructions (Signed)
Medication Instructions:  ?Your physician recommends that you continue on your current medications as directed. Please refer to the Current Medication list given to you today. ?*If you need a refill on your cardiac medications before your next appointment, please call your pharmacy* ? ?Lab Work: ?Your physician recommends that you return for lab work in: BMET TODAY  ?If you have labs (blood work) drawn today and your tests are completely normal, you will receive your results only by: ?MyChart Message (if you have MyChart) OR ?A paper copy in the mail ?If you have any lab test that is abnormal or we need to change your treatment, we will call you to review the results. ? ?Testing/Procedures: ?Your physician has requested that you have an echocardiogram. Echocardiography is a painless test that uses sound waves to create images of your heart. It provides your doctor with information about the size and shape of your heart and how well your heart?s chambers and valves are working. This procedure takes approximately one hour. There are no restrictions for this procedure. ? ?Follow-Up: ?At [facility], you and your health needs are our priority.  As part of our continuing mission to provide you with exceptional heart care, we have created designated Provider Care Teams.  These Care Teams include your primary Cardiologist (physician) and Advanced Practice Providers (APPs -  Physician Assistants and Nurse Practitioners) who all work together to provide you with the care you need, when you need it. ? ?We recommend signing up for the patient portal called "MyChart".  Sign up information is provided on this After Visit Summary.  MyChart is used to connect with patients for Virtual Visits (Telemedicine).  Patients are able to view lab/test results, encounter notes, upcoming appointments, etc.  Non-urgent messages can be sent to your provider as well.   ?To learn more about what you can do with MyChart, go to  [email or web address].   ? ?Your next appointment:   ?12 month(s) ? ?The format for your next appointment:   ?In Person ? ?Provider:   ?[name], MD   ? ? ?Other Instructions ?FOLLOW UP WITH DR Constant Mandeville ABOUT YOUR CPAP REPLACEMENT PARTS   ?

## 2021-09-08 ENCOUNTER — Other Ambulatory Visit: Payer: Self-pay | Admitting: Family Medicine

## 2021-09-08 ENCOUNTER — Ambulatory Visit
Admission: RE | Admit: 2021-09-08 | Discharge: 2021-09-08 | Disposition: A | Discharge status: Home / Self Care | Payer: Medicare Other | Source: Ambulatory Visit | Attending: Family Medicine | Admitting: Family Medicine

## 2021-09-08 DIAGNOSIS — R922 Inconclusive mammogram: Secondary | ICD-10-CM | POA: Diagnosis not present

## 2021-09-08 DIAGNOSIS — R928 Other abnormal and inconclusive findings on diagnostic imaging of breast: Secondary | ICD-10-CM | POA: Diagnosis not present

## 2021-09-08 DIAGNOSIS — N6313 Unspecified lump in the right breast, lower outer quadrant: Secondary | ICD-10-CM

## 2021-09-19 ENCOUNTER — Ambulatory Visit
Admission: RE | Admit: 2021-09-19 | Discharge: 2021-09-19 | Disposition: A | Discharge status: Home / Self Care | Payer: Medicare Other | Source: Ambulatory Visit | Attending: Family Medicine | Admitting: Family Medicine

## 2021-09-19 DIAGNOSIS — R928 Other abnormal and inconclusive findings on diagnostic imaging of breast: Secondary | ICD-10-CM

## 2021-09-19 DIAGNOSIS — N6313 Unspecified lump in the right breast, lower outer quadrant: Secondary | ICD-10-CM

## 2021-09-19 DIAGNOSIS — N62 Hypertrophy of breast: Secondary | ICD-10-CM | POA: Diagnosis not present

## 2021-09-19 HISTORY — PX: BREAST BIOPSY: SHX20

## 2021-09-21 ENCOUNTER — Ambulatory Visit (HOSPITAL_COMMUNITY): Payer: Medicare Other | Attending: Cardiovascular Disease

## 2021-09-21 DIAGNOSIS — I340 Nonrheumatic mitral (valve) insufficiency: Secondary | ICD-10-CM | POA: Insufficient documentation

## 2021-09-21 DIAGNOSIS — Q231 Congenital insufficiency of aortic valve: Secondary | ICD-10-CM

## 2021-09-21 DIAGNOSIS — E785 Hyperlipidemia, unspecified: Secondary | ICD-10-CM | POA: Insufficient documentation

## 2021-09-21 DIAGNOSIS — Z01810 Encounter for preprocedural cardiovascular examination: Secondary | ICD-10-CM | POA: Insufficient documentation

## 2021-09-21 LAB — ECHOCARDIOGRAM COMPLETE
AR max vel: 1.27 cm2
AV Area VTI: 1.2 cm2
AV Area mean vel: 1.2 cm2
AV Mean grad: 11 mmHg
AV Peak grad: 21.3 mmHg
Ao pk vel: 2.31 m/s
Area-P 1/2: 2.82 cm2
S' Lateral: 2.4 cm

## 2021-09-30 NOTE — Progress Notes (Signed)
?Charlann Boxer D.O. ?[facility] ?[place] ?Phone: [phone] ?Subjective:   ?I, Vilma Meckel, am serving as a Education administrator for Dr. Hulan Saas. ?This visit occurred during the SARS-CoV-2 public health emergency.  Safety protocols were in place, including screening questions prior to the visit, additional usage of staff PPE, and extensive cleaning of exam room while observing appropriate contact time as indicated for disinfecting solutions.  ? ?I'm seeing this patient by the request  of:  Kelton Pillar, MD ? ?CC: right shoulder and right hip  ? ?[id number]  ?Courtney [name] is a 71 y.o. female coming in with complaint of right shoulder and right hip bursa pain. Right side groin pain. Knows that she needs shoulder replacement. Problem area for a while. Popping in groin area. Have had injection, temporarily helps. Left SI joint fusion and spinal stimulator coming up. ? ?Had biopsy of the breast 4/12- scleorsing lesion- seeing surgery in the near future.  ? ?Right hip replaced in 6/22 as well  ?Hx of lumbar fusion in 21 ?Sacroilliac fusion in 2020  ?  ? ?Past Medical History:  ?Diagnosis Date  ? ADHD   ? ADD  ? Allergic to pets   ? pet dander  ? Allergies   ? Anemia   ? take iron  ? Anxiety   ? Arthritis   ? hands, hip, shoulders back,  ? Asthma   ? as a child  ? Bicuspid aortic valve   ? bicuspid AV with no stenosis or regurgitation 01/2015 echo   ? Complication of anesthesia   ? slow to wake up and lingers for a long time  ? Concussion   ? Depression   ? Family history of adverse reaction to anesthesia   ? mom had skin reaction to anesthesia but cant remember what it was or exactly what happened  ? Fatigue   ? Fibromyalgia   ? Hyperlipidemia   ? Hypothyroidism   ? Irregular periods   ? Joint pain   ? Joint stiffness   ? Lower back pain   ? Menopause   ? Migraines   ? no longer  ? Mitral regurgitation   ? Numbness and tingling   ? Pollen allergy   ? Presence of pessary   ?  Ringing in ears   ? Sleep apnea   ? does not wear CPAP tapes mouth at night  ? Snoring   ? ?Past Surgical History:  ?Procedure Laterality Date  ? ABDOMINAL EXPOSURE N/A 04/12/2020  ? Procedure: ABDOMINAL EXPOSURE;  Surgeon: [name], MD;  Location: [place];  Service: Vascular;  Laterality: N/A;  ? ABDOMINOPLASTY    ? ANTERIOR CERVICAL DECOMP/DISCECTOMY FUSION N/A 06/28/2016  ? Procedure: ACDF C5-7 ANTERIOR CERVICAL DECOMPRESSION/DISCECTOMY FUSION 2 LEVELS;  Surgeon: Melina Schools, MD;  Location: [place];  Service: Orthopedics;  Laterality: N/A;  Requests 3 hrs  ? ANTERIOR LUMBAR FUSION N/A 04/12/2020  ? Procedure: Anterior Lumbar Interbody Fusion - Lumbar four-Lumbar five , posterior instrumented fusion Lumbar four-five lumbar three -four;  Surgeon: Eustace Moore, MD;  Location: [place];  Service: Neurosurgery;  Laterality: N/A;  ? BREAST BIOPSY Right 09/19/2021  ? CARPAL TUNNEL RELEASE Bilateral 1996  ? COLONOSCOPY    ? DENTAL SURGERY    ? HERNIA REPAIR    ? times 2  ? LAMINECTOMY WITH POSTERIOR LATERAL ARTHRODESIS LEVEL 1 N/A 04/12/2020  ? Procedure: Posterior Instrumented Fusion Lumbar three to lumbar five;  Surgeon: Sherley Bounds  S, MD;  Location: [place];  Service: Neurosurgery;  Laterality: N/A;  ? LUMBAR LAMINECTOMY/DECOMPRESSION MICRODISCECTOMY  04/21/2015  ? Procedure: DECOMPRESSION LUMBAR THREE-LUMBAR FOUR;  Surgeon: Melina Schools, MD;  Location: [place];  Service: Orthopedics;;  ? LUMBAR LAMINECTOMY/DECOMPRESSION MICRODISCECTOMY N/A 06/20/2017  ? Procedure: L4-5 decompression, L5-S1 left laminotomy/foraminotomy;  Surgeon: Melina Schools, MD;  Location: [place];  Service: Orthopedics;  Laterality: N/A;  3 hrs  ? PARATHYROIDECTOMY  2012  ? SACROILIAC JOINT FUSION Right 07/24/2018  ? Procedure: SACROILIAC JOINT FUSION;  Surgeon: Melina Schools, MD;  Location: [place];  Service: Orthopedics;  Laterality: Right;  90 mins  ? SHOULDER ARTHROSCOPY W/ ACROMIAL REPAIR Right   ? TONSILLECTOMY    ? TOTAL HIP  ARTHROPLASTY Right 11/10/2020  ? Procedure: TOTAL HIP ARTHROPLASTY ANTERIOR APPROACH;  Surgeon: Gaynelle Arabian, MD;  Location: [facility];  Service: Orthopedics;  Laterality: Right;  168mn  ? ?Social History  ? ?Socioeconomic History  ? Marital status: Widowed  ?  Spouse name: Not on file  ? Number of children: 2  ? Years of education: Master's   ? Highest education level: Not on file  ?Occupational History  ? Occupation: Retired   ?Tobacco Use  ? Smoking status: Former  ?  Packs/day: 0.25  ?  Years: 4.00  ?  Pack years: 1.00  ?  Types: Cigarettes  ?  Quit date: 154 ?  Years since quitting: 47.3  ? Smokeless tobacco: Never  ? Tobacco comments:  ?  social smoker stop at age 71 ?Vaping Use  ? Vaping Use: Never used  ?Substance and Sexual Activity  ? Alcohol use: Yes  ?  Alcohol/week: 1.0 standard drink  ?  Types: 1 Glasses of wine per week  ?  Comment: socially  ? Drug use: No  ? Sexual activity: Never  ?Other Topics Concern  ? Not on file  ?Social History Narrative  ? Lives alone  ? Caffeine use: 1 cup coffee/day  ? ?Social Determinants of Health  ? ?Financial Resource Strain: Not on file  ?Food Insecurity: Not on file  ?Transportation Needs: Not on file  ?Physical Activity: Not on file  ?Stress: Not on file  ?Social Connections: Not on file  ? ?Allergies  ?Allergen Reactions  ? Fish Allergy Anaphylaxis  ?  Can tolerate shellfish. Allergic to fish with shells and fins.  ? Peanuts [Peanut Oil] Anaphylaxis  ?  Can eat cashews, pistachios, and almonds  ? ?Family History  ?Problem Relation Age of Onset  ? COPD Mother   ? Cancer Mother   ? Hypertension Mother   ? Hyperlipidemia Mother   ? Heart disease Mother   ? Thyroid disease Mother   ? Thyroid disease Sister   ? Thyroid disease Daughter   ? ? ?Current Outpatient Medications (Endocrine & Metabolic):  ?  levothyroxine (SYNTHROID, LEVOTHROID) 25 MCG tablet, Take 1 1/2 tablets daily (Patient taking differently: Take 37.5 mcg by mouth daily before breakfast. Take 1 1/2  tablets daily) ? ?Current Outpatient Medications (Cardiovascular):  ?  atorvastatin (LIPITOR) 10 MG tablet, Take 10 mg by mouth every 3 (three) days. In the evening ?  EPINEPHrine (EPIPEN JR) 0.15 MG/0.3ML injection, Inject 0.15 mg into the muscle as needed for anaphylaxis. ? ?Current Outpatient Medications (Respiratory):  ?  fluticasone (FLONASE) 50 MCG/ACT nasal spray, Place 1 spray into both nostrils at bedtime.  ?  loratadine (CLARITIN) 10 MG tablet, Take 10 mg by mouth daily as needed (for seasonal allergies (Spring/Summer)).  ? ?  Current Outpatient Medications (Analgesics):  ?  acetaminophen (TYLENOL) 500 MG tablet, Take 500 mg by mouth in the morning and at bedtime. ?  oxyCODONE (ROXICODONE) 5 MG immediate release tablet, Take 1-2 tablets (5-10 mg total) by mouth every 6 (six) hours as needed for breakthrough pain. (Patient not taking: Reported on 09/02/2021) ?  Oxycodone HCl 10 MG TABS, Take 5-7.5 mg by mouth 4 (four) times daily as needed (pain). ? ?Current Outpatient Medications (Hematological):  ?  FERROUS BISGLYCINATE CHELATE PO, Take 25 mg by mouth daily.  ? ?Current Outpatient Medications (Other):  ?  amoxicillin (AMOXIL) 500 MG tablet, Take 2,000 mg by mouth See admin instructions. Dental procedures ?  Artificial Tear Solution (GENTEAL TEARS OP), Place 1 drop into both eyes daily as needed (dry eyes). ?  Ascorbic Acid (VITAMIN C) 1000 MG tablet, Take 1,000 mg by mouth in the morning and at bedtime. ?  CALCIUM CITRATE PO, Take 1,000 mg by mouth daily. ?  Carboxymethylcellul-Glycerin (REFRESH OPTIVE OP), Place 1 drop into both eyes daily as needed (dry eyes). ?  cholecalciferol (VITAMIN D3) 25 MCG (1000 UNIT) tablet, Take 1,000 Units by mouth 2 (two) times daily. ?  clobetasol ointment (TEMOVATE) 8.50 %, Apply 1 application topically every 30 (thirty) days. ?  Coenzyme Q10 300 MG CAPS, Take 300 mg by mouth daily.  ?  docusate sodium (COLACE) 100 MG capsule, Take 100 mg by mouth every evening. ?   DULoxetine (CYMBALTA) 30 MG capsule, Take 30 mg by mouth daily after breakfast.  ?  estradiol (ESTRACE) 0.1 MG/GM vaginal cream, Place 0.1 g vaginally every 3 (three) days.  ?  lactobacillus acidophilus (BACID) TABS tablet,

## 2021-10-03 ENCOUNTER — Ambulatory Visit (INDEPENDENT_AMBULATORY_CARE_PROVIDER_SITE_OTHER): Payer: Medicare Other | Admitting: Family Medicine

## 2021-10-03 VITALS — BP 138/90 | HR 72 | Ht 63.0 in | Wt 141.0 lb

## 2021-10-03 DIAGNOSIS — M25552 Pain in left hip: Secondary | ICD-10-CM

## 2021-10-03 DIAGNOSIS — Z96649 Presence of unspecified artificial hip joint: Secondary | ICD-10-CM

## 2021-10-03 DIAGNOSIS — M25559 Pain in unspecified hip: Secondary | ICD-10-CM | POA: Diagnosis not present

## 2021-10-03 NOTE — Assessment & Plan Note (Addendum)
Right-sided hip pain.  Patient states that she did have audible pops before this occurred.  Still has intermittent pain when this occurs from time to time.  Discussed with patient and patient also had iliopsoas tendon injections and this could be a potential injury with patient also having some weakness noted with certain movements.  At this point I do feel that a bone scan would be potentially beneficial.  Make sure that there is no significant instability.  If normal then likely will be more of the hip flexor tendinitis versus partial tearing and given home exercises that I think will be beneficial.  Patient does not have any type of signs or symptoms of a hernia noted.  Patient has multiple different musculoskeletal complaints over the course of time and is likely undergoing a sacral fusion on the contralateral side in the near future.  We will see if there is anything else we can do and can consider the possibility of further laboratory work-up to see if any other treatment option can help with some of her pain in the long run.  Follow-up again in 6 weeks we will discuss some of her other ailments that we did not have time to address today. ?

## 2021-10-03 NOTE — Patient Instructions (Addendum)
Do prescribed exercises at least 3x a week ? ?Bone scan ?Body helix size large thigh sleeve daily ?Tart cherry 1200 mg ?L-arginine 500-'1000mg'$  daily ?See you again in 6 weeks ?

## 2021-10-18 DIAGNOSIS — N6489 Other specified disorders of breast: Secondary | ICD-10-CM | POA: Diagnosis not present

## 2021-10-19 DIAGNOSIS — Q231 Congenital insufficiency of aortic valve: Secondary | ICD-10-CM | POA: Diagnosis not present

## 2021-10-19 DIAGNOSIS — E039 Hypothyroidism, unspecified: Secondary | ICD-10-CM | POA: Diagnosis not present

## 2021-10-19 DIAGNOSIS — G4733 Obstructive sleep apnea (adult) (pediatric): Secondary | ICD-10-CM | POA: Diagnosis not present

## 2021-10-19 DIAGNOSIS — I700 Atherosclerosis of aorta: Secondary | ICD-10-CM | POA: Diagnosis not present

## 2021-10-19 DIAGNOSIS — Z0000 Encounter for general adult medical examination without abnormal findings: Secondary | ICD-10-CM | POA: Diagnosis not present

## 2021-10-19 DIAGNOSIS — L309 Dermatitis, unspecified: Secondary | ICD-10-CM | POA: Diagnosis not present

## 2021-10-19 DIAGNOSIS — M4800 Spinal stenosis, site unspecified: Secondary | ICD-10-CM | POA: Diagnosis not present

## 2021-10-19 DIAGNOSIS — R7303 Prediabetes: Secondary | ICD-10-CM | POA: Diagnosis not present

## 2021-10-19 DIAGNOSIS — K219 Gastro-esophageal reflux disease without esophagitis: Secondary | ICD-10-CM | POA: Diagnosis not present

## 2021-10-19 DIAGNOSIS — G629 Polyneuropathy, unspecified: Secondary | ICD-10-CM | POA: Diagnosis not present

## 2021-10-19 DIAGNOSIS — E7800 Pure hypercholesterolemia, unspecified: Secondary | ICD-10-CM | POA: Diagnosis not present

## 2021-10-20 ENCOUNTER — Other Ambulatory Visit: Payer: Self-pay | Admitting: General Surgery

## 2021-10-20 ENCOUNTER — Encounter (HOSPITAL_BASED_OUTPATIENT_CLINIC_OR_DEPARTMENT_OTHER): Payer: Self-pay | Admitting: General Surgery

## 2021-10-20 DIAGNOSIS — N6489 Other specified disorders of breast: Secondary | ICD-10-CM

## 2021-10-24 NOTE — H&P (Signed)
?REFERRING PHYSICIAN:  Maryla Morrow, MD ?  ?PROVIDER:  Georgianne Fick, MD ?  ?MRN: [id number] ?DOB: 04-Oct-1950 ?DATE OF ENCOUNTER: 10/18/2021 ?Subjective  ?Chief Complaint: Breast Problem (Right breast complex) ?  ?  ?History of Present Illness: ?Courtney [name] is a 71 y.o. female who is seen today as an office consultation for evaluation of Breast Problem (Right breast complex) ?  ?Patient is a 71 year old female who had an abnormal mammogram in March.  Diagnostic mammogram was then performed on the right.  This showed a 9 mm solid and cystic mass at 3:00 4 cm from the nipple.  Breast density was C.  Core needle biopsy was performed which showed a complex sclerosing lesion.  She is referred for excision. ?  ?She does have a family history of cancer.  Her mother had breast cancer in her 42s.  Her mother also developed pancreatic cancer and ended up dying of that.  She had negative genetic testing several years ago performed with her primary care physician at [place]. ?  ?Of note, the patient has significant issues with her joints.  She is working on getting approved an SI joint fusion and a spinal nerve stimulator with Dr. Rolena Infante.  This has not yet been scheduled.  She also is seeing a sports medicine doctor, Dr. Gardenia Phlegm, for right leg weakness. ?  ?  ?  ?Dx mammogram/us 09/08/2021 ?  ?ACR Breast Density Category c: The breast tissue is heterogeneously ?dense, which may obscure small masses. ?  ?FINDINGS: ?Questioned asymmetry within the inferior right breast middle depth ?partially effaced with additional imaging suggestive of dense ?fibroglandular tissue. ?  ?Targeted ultrasound is performed, showing a 9 x 6 x 6 mm probable ?solid and cystic mass right breast 8 o'clock position 4 cm from ?nipple. No right axillary adenopathy. This is likely an incidental ?finding. Normal dense tissue is demonstrated within the inferior ?right breast. ?  ?IMPRESSION: ?Indeterminate probable solid and cystic mass right  breast 8 o'clock ?position 4 cm from nipple, likely incidentally identified on ?ultrasound. ?  ?RECOMMENDATION: ?Ultrasound-guided core needle biopsy right breast mass 8 o'clock ?position. ?  ?I have discussed the findings and recommendations with the patient. ?If applicable, a reminder letter will be sent to the patient ?regarding the next appointment. ?  ?BI-RADS CATEGORY  4: Suspicious. ?  ?  ?Pathology core needle biopsy 09/19/2021 ?Breast, right, needle core biopsy, 8 o'clock, ribbon clip ?- COMPLEX SCLEROSING LESION WITH FLORID USUAL DUCTAL HYPERPLASIA ?   ?Review of Systems: ?A complete review of systems was obtained from the patient.  I have reviewed this information and discussed as appropriate with the patient.  See HPI as well for other ROS. ?  ?Review of Systems  ?Gastrointestinal: Positive for heartburn.  ?Musculoskeletal: Positive for joint pain.  ?Neurological: Positive for focal weakness (right leg).  ?Psychiatric/Behavioral: Positive for depression.  ?All other systems reviewed and are negative. ?  ?  ?  ?Medical History: ?Past Medical History  ?    ?Past Medical History:  ?Diagnosis Date  ? ADD (attention deficit disorder)    ? Anemia    ? Anxiety    ? Asthma, extrinsic    ?  mild  ? Cervical spondylosis    ?  [place]  ? Chronic pain    ? Depression    ? GERD (gastroesophageal reflux disease)    ? Hypercalcemia    ?  RESOLVED; [facility]  ? Hyperlipidemia    ? Insomnia    ?  Internal hemorrhoids    ? Low back pain    ? NASH (nonalcoholic steatohepatitis) 2015  ?  noted on abd MRI  ? Neck pain    ? Osteoarthritis    ? Sleep apnea    ?  ?  ?  ?   ?Patient Active Problem List  ?Diagnosis  ? Cervical spondylosis  ? Hyperlipidemia  ? GERD (gastroesophageal reflux disease)  ? Insomnia  ? Sleep apnea  ? Asthma, extrinsic  ? Hypercalcemia  ? Depression  ? Fatigue  ? H/O hematuria  ? Vitamin D insufficiency  ? ADHD (attention deficit hyperactivity disorder)  ? Plantar fasciitis  ? Stress fracture of  calcaneus  ? Palpitations  ? H/O thyroid nodule  ? Left shoulder pain  ? Low back pain  ? Rotator cuff impingement syndrome  ? Lumbar radiculopathy  ? Left hip pain  ? Degeneration of lumbar or lumbosacral intervertebral disc  ? Lumbar spinal stenosis  ? Hemangioma  ? Spinal stenosis, lumbar region, without neurogenic claudication  ? Fatty liver  ? Renal cyst  ? NASH (nonalcoholic steatohepatitis)  ? Facet arthropathy, cervical  ? Lumbar facet arthropathy  ? High risk medication use  ? ADD (attention deficit disorder)  ? Internal hemorrhoids  ? Complex sclerosing lesion of right breast  ?  ?  ?Past Surgical History  ?     ?Past Surgical History:  ?Procedure Laterality Date  ? TONSILLECTOMY   1973  ? ABDOMINOPLASTY      ? CARPAL TUNNEL RELEASE Bilateral    ? INGUINAL HERNIA REPAIR Bilateral    ? rectal fistula      ? RHINOPLASTY      ? SHOULDER ARTHROSCOPY      ? SHOULDER SURGERY      ?  right  ?  ?  ?  ?Allergies  ?     ?Allergies  ?Allergen Reactions  ? Fish Containing Products Anaphylaxis  ? Others Anaphylaxis  ?    Uncoded Allergy. Allergen: peanuts  ? Others Anaphylaxis  ?    Uncoded Allergy. Allergen: fish with fins  ? Tree Nut Anaphylaxis  ?  ?  ?  ?      ?Current Outpatient Medications on File Prior to Visit  ?Medication Sig Dispense Refill  ? methocarbamoL (ROBAXIN) 500 MG tablet methocarbamol 500 mg tablet ? TAKE 1 TABLET BY MOUTH TWICE DAILY      ? oxyCODONE (DAZIDOX) 10 mg immediate release tablet oxycodone 10 mg tablet ? TAKE 1 TABLET BY MOUTH FOUR TIMES DAILY AS NEEDED FOR PAIN      ? amoxicillin (AMOXIL) 500 MG tablet        ? ascorbic acid (VITAMIN C) 1000 MG tablet 1 TAB DAILY      ? aspirin 81 MG EC tablet 1 tab by mouth daily      ? atorvastatin (LIPITOR) 40 MG tablet Take by mouth.      ? atovaquone-proguanil (MALARONE) 250-100 mg tablet Take 2 days prior to departure, daily for the duration of exposure to malaria, and daily for 1 week after return 30 tablet 0  ? b complex vitamins capsule Take 1  capsule by mouth daily.        ? calcium polycarbophil (FIBERCON) 625 mg tablet Take 625 mg by mouth daily.        ? cholecalciferol (VITAMIN D3) 1,000 unit tablet Take 1,000 Units by mouth.      ? dextroamphetamine (DEXEDRINE SPANSULE) 10 MG SR capsule        ?  diclofenac (VOLTAREN) 1 % topical gel Voltaren 1 % topical gel ? APPLY 2 GRAMS TO THE AFFECTED AREA(S) BY TOPICAL ROUTE 4 TIMES PER DAY      ? DULoxetine (CYMBALTA) 30 MG DR capsule Take 2 capsules (60 mg total) by mouth once daily. 180 capsule 3  ? EPINEPHrine (EPIPEN JR) 0.15 mg/0.3 mL auto-injector EpiPen Jr 0.15 mg/0.3 mL injection,auto-injector ?  0.15 mg by injection route.      ? estradioL (ESTRACE) 0.01 % (0.1 mg/gram) vaginal cream estradiol 0.01% (0.1 mg/gram) vaginal cream      ? fluticasone (FLONASE) 50 mcg/actuation nasal spray 2 sprays to each nostril daily 48 g 1  ? iron,salt mix,-docus-C-FA-37m 200-50-1 mg Tab Take 3 tablets by mouth daily.       ? levothyroxine (SYNTHROID) 25 MCG tablet levothyroxine 25 mcg tablet      ? lidocaine (LIDODERM) 5 % patch Place 1 patch onto the skin daily. 90 patch 3  ? loratadine (CLARITIN) 10 mg tablet 1 tab by mouth daily as needed      ? MAGNESIUM ASPARTATE HCL ORAL Take 1 tablet by mouth daily.      ? meloxicam (MOBIC) 15 MG tablet meloxicam 15 mg tablet      ? multivitamin with minerals tablet Take 0.5 tablets by mouth daily.      ? naproxen sodium 220 mg Cap 1 tab   twice a day      ? OMEGA-3S/DHA/EPA/FISH OIL (OMEGA 3 ORAL) Take 1,280 mg by mouth 2 (two) times daily.      ? omeprazole (PRILOSEC) 20 MG DR capsule Take 1 capsule (20 mg total) by mouth once daily. 90 capsule 3  ? pregabalin (LYRICA) 100 MG capsule Take 1 capsule (100 mg total) by mouth 5 (five) times daily. 150 capsule 5  ? traZODone (DESYREL) 100 MG tablet Take 0.5 tablets (50 mg total) by mouth nightly. 90 tablet 0  ? urea (CEROVEL) 40 % topical cream APPLY TWICE A DAY AS NEEDED 85 g 0  ? vibegron (GEMTESA) 75 mg Tab Gemtesa 75 mg tablet       ? vitamin E 400 UNIT capsule Take 400 Units by mouth once daily.      ?  ?No current facility-administered medications on file prior to visit.  ?  ?  ?Family History  ?     ?Family History  ?Problem Re

## 2021-10-26 ENCOUNTER — Telehealth: Payer: Self-pay | Admitting: Family Medicine

## 2021-10-26 NOTE — Telephone Encounter (Signed)
Patient is scheduled to have a [facility] Whole Body Bone Scan at [facility] on May 31st at 9:00am. The authorization through her Courtney [name] will expire on May 24th so it will need to be re-run so that it will have approval for the appointment. ? ? ?

## 2021-10-27 ENCOUNTER — Ambulatory Visit
Admission: RE | Admit: 2021-10-27 | Discharge: 2021-10-27 | Disposition: A | Discharge status: Home / Self Care | Payer: Medicare Other | Source: Ambulatory Visit | Attending: General Surgery | Admitting: General Surgery

## 2021-10-27 DIAGNOSIS — N6489 Other specified disorders of breast: Secondary | ICD-10-CM

## 2021-10-27 DIAGNOSIS — R928 Other abnormal and inconclusive findings on diagnostic imaging of breast: Secondary | ICD-10-CM | POA: Diagnosis not present

## 2021-10-28 ENCOUNTER — Ambulatory Visit (HOSPITAL_BASED_OUTPATIENT_CLINIC_OR_DEPARTMENT_OTHER): Payer: Medicare Other | Admitting: Certified Registered"

## 2021-10-28 ENCOUNTER — Other Ambulatory Visit: Payer: Self-pay

## 2021-10-28 ENCOUNTER — Encounter (HOSPITAL_BASED_OUTPATIENT_CLINIC_OR_DEPARTMENT_OTHER): Payer: Self-pay | Admitting: General Surgery

## 2021-10-28 ENCOUNTER — Ambulatory Visit
Admission: RE | Admit: 2021-10-28 | Discharge: 2021-10-28 | Disposition: A | Discharge status: Home / Self Care | Payer: Medicare Other | Source: Ambulatory Visit | Attending: General Surgery | Admitting: General Surgery

## 2021-10-28 ENCOUNTER — Ambulatory Visit (HOSPITAL_BASED_OUTPATIENT_CLINIC_OR_DEPARTMENT_OTHER)
Admission: RE | Admit: 2021-10-28 | Discharge: 2021-10-28 | Disposition: A | Discharge status: Home / Self Care | Payer: Medicare Other | Attending: General Surgery | Admitting: General Surgery

## 2021-10-28 ENCOUNTER — Encounter (HOSPITAL_BASED_OUTPATIENT_CLINIC_OR_DEPARTMENT_OTHER)
Admission: RE | Disposition: A | Discharge status: Home / Self Care | Payer: Self-pay | Source: Home / Self Care | Attending: General Surgery

## 2021-10-28 DIAGNOSIS — Z800 Family history of malignant neoplasm of digestive organs: Secondary | ICD-10-CM | POA: Insufficient documentation

## 2021-10-28 DIAGNOSIS — G4730 Sleep apnea, unspecified: Secondary | ICD-10-CM | POA: Diagnosis not present

## 2021-10-28 DIAGNOSIS — Z87891 Personal history of nicotine dependence: Secondary | ICD-10-CM | POA: Insufficient documentation

## 2021-10-28 DIAGNOSIS — D759 Disease of blood and blood-forming organs, unspecified: Secondary | ICD-10-CM | POA: Diagnosis not present

## 2021-10-28 DIAGNOSIS — N6489 Other specified disorders of breast: Secondary | ICD-10-CM | POA: Diagnosis not present

## 2021-10-28 DIAGNOSIS — D649 Anemia, unspecified: Secondary | ICD-10-CM | POA: Insufficient documentation

## 2021-10-28 DIAGNOSIS — F418 Other specified anxiety disorders: Secondary | ICD-10-CM

## 2021-10-28 DIAGNOSIS — E039 Hypothyroidism, unspecified: Secondary | ICD-10-CM | POA: Diagnosis not present

## 2021-10-28 DIAGNOSIS — M797 Fibromyalgia: Secondary | ICD-10-CM | POA: Diagnosis not present

## 2021-10-28 DIAGNOSIS — R531 Weakness: Secondary | ICD-10-CM | POA: Insufficient documentation

## 2021-10-28 DIAGNOSIS — N6011 Diffuse cystic mastopathy of right breast: Secondary | ICD-10-CM | POA: Diagnosis not present

## 2021-10-28 DIAGNOSIS — Z803 Family history of malignant neoplasm of breast: Secondary | ICD-10-CM | POA: Diagnosis not present

## 2021-10-28 DIAGNOSIS — Z01818 Encounter for other preprocedural examination: Secondary | ICD-10-CM

## 2021-10-28 DIAGNOSIS — F419 Anxiety disorder, unspecified: Secondary | ICD-10-CM | POA: Insufficient documentation

## 2021-10-28 DIAGNOSIS — R928 Other abnormal and inconclusive findings on diagnostic imaging of breast: Secondary | ICD-10-CM | POA: Diagnosis not present

## 2021-10-28 DIAGNOSIS — D241 Benign neoplasm of right breast: Secondary | ICD-10-CM | POA: Diagnosis not present

## 2021-10-28 DIAGNOSIS — N62 Hypertrophy of breast: Secondary | ICD-10-CM | POA: Diagnosis not present

## 2021-10-28 HISTORY — PX: RADIOACTIVE SEED GUIDED EXCISIONAL BREAST BIOPSY: SHX6490

## 2021-10-28 SURGERY — RADIOACTIVE SEED GUIDED BREAST BIOPSY
Anesthesia: Monitor Anesthesia Care | Site: Breast | Laterality: Right

## 2021-10-28 MED ORDER — LACTATED RINGERS IV SOLN: INTRAVENOUS | Status: DC

## 2021-10-28 MED ORDER — FENTANYL CITRATE (PF) 100 MCG/2ML IJ SOLN
INTRAMUSCULAR | Status: AC
  Filled 2021-10-28: qty 2

## 2021-10-28 MED ORDER — FENTANYL CITRATE (PF) 100 MCG/2ML IJ SOLN
INTRAMUSCULAR | Status: DC | PRN
  Administered 2021-10-28: 50 ug via INTRAVENOUS

## 2021-10-28 MED ORDER — BUPIVACAINE HCL (PF) 0.25 % IJ SOLN
INTRAMUSCULAR | Status: AC
  Filled 2021-10-28: qty 30

## 2021-10-28 MED ORDER — LIDOCAINE HCL (PF) 1 % IJ SOLN
INTRAMUSCULAR | Status: AC
  Filled 2021-10-28: qty 30

## 2021-10-28 MED ORDER — CHLORHEXIDINE GLUCONATE CLOTH 2 % EX PADS
6.0000 | MEDICATED_PAD | Freq: Once | CUTANEOUS | Status: DC
Start: 2021-10-28 — End: 2021-10-28

## 2021-10-28 MED ORDER — LIDOCAINE HCL (PF) 1 % IJ SOLN
INTRAMUSCULAR | Status: DC | PRN
  Administered 2021-10-28: 15 mL

## 2021-10-28 MED ORDER — MIDAZOLAM HCL 2 MG/2ML IJ SOLN
INTRAMUSCULAR | Status: AC
  Filled 2021-10-28: qty 2

## 2021-10-28 MED ORDER — LIDOCAINE HCL (CARDIAC) PF 100 MG/5ML IV SOSY
PREFILLED_SYRINGE | INTRAVENOUS | Status: DC | PRN
Start: 2021-10-28 — End: 2021-10-28
  Administered 2021-10-28: 40 mg via INTRAVENOUS

## 2021-10-28 MED ORDER — ACETAMINOPHEN 500 MG PO TABS
500.0000 mg | ORAL_TABLET | Freq: Once | ORAL | Status: AC
Start: 2021-10-28 — End: 2021-10-28
  Administered 2021-10-28: 500 mg via ORAL

## 2021-10-28 MED ORDER — PROPOFOL 500 MG/50ML IV EMUL
INTRAVENOUS | Status: DC | PRN
Start: 2021-10-28 — End: 2021-10-28
  Administered 2021-10-28 (×2): 20 mg via INTRAVENOUS
  Administered 2021-10-28 (×2): 100 ug/kg/min via INTRAVENOUS

## 2021-10-28 MED ORDER — FENTANYL CITRATE (PF) 100 MCG/2ML IJ SOLN: 25.0000 ug | INTRAMUSCULAR | Status: DC | PRN

## 2021-10-28 MED ORDER — OXYCODONE HCL 5 MG/5ML PO SOLN: 5.0000 mg | Freq: Once | ORAL | Status: DC | PRN

## 2021-10-28 MED ORDER — ACETAMINOPHEN 500 MG PO TABS: 1000.0000 mg | ORAL_TABLET | ORAL | Status: DC

## 2021-10-28 MED ORDER — ONDANSETRON HCL 4 MG/2ML IJ SOLN
INTRAMUSCULAR | Status: DC | PRN
  Administered 2021-10-28: 4 mg via INTRAVENOUS

## 2021-10-28 MED ORDER — ACETAMINOPHEN 500 MG PO TABS
ORAL_TABLET | ORAL | Status: AC
  Filled 2021-10-28: qty 2

## 2021-10-28 MED ORDER — BUPIVACAINE-EPINEPHRINE (PF) 0.25% -1:200000 IJ SOLN
INTRAMUSCULAR | Status: DC | PRN
  Administered 2021-10-28: 15 mL via PERINEURAL

## 2021-10-28 MED ORDER — EPHEDRINE SULFATE (PRESSORS) 50 MG/ML IJ SOLN
INTRAMUSCULAR | Status: DC | PRN
  Administered 2021-10-28 (×2): 5 mg via INTRAVENOUS

## 2021-10-28 MED ORDER — EPHEDRINE 5 MG/ML INJ
INTRAVENOUS | Status: AC
  Filled 2021-10-28: qty 20

## 2021-10-28 MED ORDER — 0.9 % SODIUM CHLORIDE (POUR BTL) OPTIME
TOPICAL | Status: DC | PRN
  Administered 2021-10-28: 120 mL

## 2021-10-28 MED ORDER — OXYCODONE HCL 5 MG PO TABS: 5.0000 mg | ORAL_TABLET | Freq: Four times a day (QID) | ORAL | 0 refills | Status: DC | PRN

## 2021-10-28 MED ORDER — CEFAZOLIN SODIUM-DEXTROSE 2-4 GM/100ML-% IV SOLN
2.0000 g | INTRAVENOUS | Status: AC
  Administered 2021-10-28: 2 g via INTRAVENOUS

## 2021-10-28 MED ORDER — OXYCODONE HCL 5 MG PO TABS: 5.0000 mg | ORAL_TABLET | Freq: Once | ORAL | Status: DC | PRN

## 2021-10-28 MED ORDER — ONDANSETRON HCL 4 MG/2ML IJ SOLN
INTRAMUSCULAR | Status: AC
  Filled 2021-10-28: qty 2

## 2021-10-28 MED ORDER — LIDOCAINE 2% (20 MG/ML) 5 ML SYRINGE
INTRAMUSCULAR | Status: AC
  Filled 2021-10-28: qty 5

## 2021-10-28 MED ORDER — PROPOFOL 500 MG/50ML IV EMUL
INTRAVENOUS | Status: AC
  Filled 2021-10-28: qty 150

## 2021-10-28 MED ORDER — ACETAMINOPHEN 500 MG PO TABS: 1000.0000 mg | ORAL_TABLET | Freq: Once | ORAL | Status: DC

## 2021-10-28 MED ORDER — DEXAMETHASONE SODIUM PHOSPHATE 10 MG/ML IJ SOLN
INTRAMUSCULAR | Status: AC
  Filled 2021-10-28: qty 1

## 2021-10-28 SURGICAL SUPPLY — 52 items
ADH SKN CLS APL DERMABOND .7 (GAUZE/BANDAGES/DRESSINGS) ×1
APL PRP STRL LF DISP 70% ISPRP (MISCELLANEOUS) ×1
BINDER BREAST LRG (GAUZE/BANDAGES/DRESSINGS) ×1 IMPLANT
BLADE SURG 10 STRL SS (BLADE) ×2 IMPLANT
BLADE SURG 15 STRL LF DISP TIS (BLADE) IMPLANT
BLADE SURG 15 STRL SS (BLADE)
CANISTER SUC SOCK COL 7IN (MISCELLANEOUS) IMPLANT
CANISTER SUCT 1200ML W/VALVE (MISCELLANEOUS) ×2 IMPLANT
CHLORAPREP W/TINT 26 (MISCELLANEOUS) ×2 IMPLANT
CLIP TI LARGE 6 (CLIP) ×2 IMPLANT
COVER BACK TABLE 60X90IN (DRAPES) ×2 IMPLANT
COVER MAYO STAND STRL (DRAPES) ×2 IMPLANT
COVER PROBE W GEL 5X96 (DRAPES) ×2 IMPLANT
DERMABOND ADVANCED (GAUZE/BANDAGES/DRESSINGS) ×1
DERMABOND ADVANCED .7 DNX12 (GAUZE/BANDAGES/DRESSINGS) ×1 IMPLANT
DRAPE LAPAROSCOPIC ABDOMINAL (DRAPES) ×2 IMPLANT
DRAPE UTILITY XL STRL (DRAPES) ×2 IMPLANT
DRSG PAD ABDOMINAL 8X10 ST (GAUZE/BANDAGES/DRESSINGS) ×1 IMPLANT
ELECT COATED BLADE 2.86 ST (ELECTRODE) ×2 IMPLANT
ELECT REM PT RETURN 9FT ADLT (ELECTROSURGICAL) ×2
ELECTRODE REM PT RTRN 9FT ADLT (ELECTROSURGICAL) ×1 IMPLANT
GAUZE SPONGE 4X4 12PLY STRL LF (GAUZE/BANDAGES/DRESSINGS) ×2 IMPLANT
GLOVE BIO SURGEON STRL SZ 6 (GLOVE) ×2 IMPLANT
GLOVE BIOGEL PI IND STRL 6.5 (GLOVE) ×1 IMPLANT
GLOVE BIOGEL PI INDICATOR 6.5 (GLOVE) ×1
GOWN STRL REUS W/ TWL LRG LVL3 (GOWN DISPOSABLE) ×1 IMPLANT
GOWN STRL REUS W/TWL 2XL LVL3 (GOWN DISPOSABLE) ×2 IMPLANT
GOWN STRL REUS W/TWL LRG LVL3 (GOWN DISPOSABLE) ×2
KIT MARKER MARGIN INK (KITS) ×2 IMPLANT
LIGHT WAVEGUIDE WIDE FLAT (MISCELLANEOUS) ×1 IMPLANT
NDL HYPO 25X1 1.5 SAFETY (NEEDLE) ×1 IMPLANT
NEEDLE HYPO 25X1 1.5 SAFETY (NEEDLE) ×2 IMPLANT
NS IRRIG 1000ML POUR BTL (IV SOLUTION) ×2 IMPLANT
PACK BASIN DAY SURGERY FS (CUSTOM PROCEDURE TRAY) ×2 IMPLANT
PENCIL SMOKE EVACUATOR (MISCELLANEOUS) ×2 IMPLANT
SLEEVE SCD COMPRESS KNEE MED (STOCKING) ×2 IMPLANT
SPIKE FLUID TRANSFER (MISCELLANEOUS) IMPLANT
SPONGE T-LAP 18X18 ~~LOC~~+RFID (SPONGE) ×2 IMPLANT
STAPLER VISISTAT 35W (STAPLE) IMPLANT
STRIP CLOSURE SKIN 1/2X4 (GAUZE/BANDAGES/DRESSINGS) ×2 IMPLANT
SUT MON AB 4-0 PC3 18 (SUTURE) ×2 IMPLANT
SUT SILK 2 0 SH (SUTURE) IMPLANT
SUT VIC AB 2-0 SH 18 (SUTURE) IMPLANT
SUT VIC AB 3-0 SH 27 (SUTURE) ×2
SUT VIC AB 3-0 SH 27X BRD (SUTURE) ×1 IMPLANT
SUT VICRYL 3-0 CR8 SH (SUTURE) IMPLANT
SYR BULB EAR ULCER 3OZ GRN STR (SYRINGE) ×2 IMPLANT
SYR CONTROL 10ML LL (SYRINGE) ×2 IMPLANT
TOWEL GREEN STERILE FF (TOWEL DISPOSABLE) ×2 IMPLANT
TRAY FAXITRON CT DISP (TRAY / TRAY PROCEDURE) ×2 IMPLANT
TUBE CONNECTING 20X1/4 (TUBING) ×2 IMPLANT
YANKAUER SUCT BULB TIP NO VENT (SUCTIONS) ×2 IMPLANT

## 2021-10-28 NOTE — Anesthesia Postprocedure Evaluation (Signed)
Anesthesia Post Note  Patient: Courtney [name]  Procedure(s) Performed: RADIOACTIVE SEED GUIDED EXCISIONAL RIGHT BREAST BIOPSY (Right: Breast)     Patient location during evaluation: PACU Anesthesia Type: MAC Level of consciousness: awake and alert Pain management: pain level controlled Vital Signs Assessment: post-procedure vital signs reviewed and stable Respiratory status: spontaneous breathing, nonlabored ventilation and respiratory function stable Cardiovascular status: blood pressure returned to baseline Postop Assessment: no apparent nausea or vomiting Anesthetic complications: no   No notable events documented.  Last Vitals:  Vitals:   10/28/21 1036 10/28/21 1045  BP:  (!) 98/52  Pulse: 64 (!) 57  Resp: 11 (!) 7  Temp:    SpO2: 99% 97%    Last Pain:  Vitals:   10/28/21 1102  TempSrc:   PainSc: 0-No pain                 Marthenia Rolling

## 2021-10-28 NOTE — Discharge Instructions (Addendum)
[place] Office Phone Number [phone]  BREAST BIOPSY/ PARTIAL MASTECTOMY: POST OP INSTRUCTIONS  Always review your discharge instruction sheet given to you by the facility where your surgery was performed.  IF YOU HAVE DISABILITY OR FAMILY LEAVE FORMS, YOU MUST BRING THEM TO THE OFFICE FOR PROCESSING.  DO NOT GIVE THEM TO YOUR DOCTOR.  Take 2 tylenol (acetominophen) three times a day for 3 days.  If you still have pain, add ibuprofen with food in between if able to take this (if you have kidney issues or stomach issues, do not take ibuprofen).  If both of those are not enough, add the narcotic pain pill.  If you find you are needing a lot of this overnight after surgery, call the next morning for a refill.    Prescriptions will not be filled after 5pm or on week-ends. Take your usually prescribed medications unless otherwise directed You should eat very light the first 24 hours after surgery, such as soup, crackers, pudding, etc.  Resume your normal diet the day after surgery. Most patients will experience some swelling and bruising in the breast.  Ice packs and a good support bra will help.  Swelling and bruising can take several days to resolve.  It is common to experience some constipation if taking pain medication after surgery.  Increasing fluid intake and taking a stool softener will usually help or prevent this problem from occurring.  A mild laxative (Milk of Magnesia or Miralax) should be taken according to package directions if there are no bowel movements after 48 hours. Unless discharge instructions indicate otherwise, you may remove your bandages 48 hours after surgery, and you may shower at that time.  You may have steri-strips (small skin tapes) in place directly over the incision.  These strips should be left on the skin at least for for 7-10 days.    ACTIVITIES:  You may resume regular daily activities (gradually increasing) beginning the next day.  Wearing a  good support bra or sports bra (or the breast binder) minimizes pain and swelling.  You may have sexual intercourse when it is comfortable. No heavy lifting for 1-2 weeks (not over around 10 pounds).  You may drive when you no longer are taking prescription pain medication, you can comfortably wear a seatbelt, and you can safely maneuver your car and apply brakes. RETURN TO WORK:  __________3-14 days depending on job. _______________ Dennis Bast should see your doctor in the office for a follow-up appointment approximately two weeks after your surgery.  Your doctor's nurse will typically make your follow-up appointment when she calls you with your pathology report.  Expect your pathology report 3-4 business days after your surgery.  You may call to check if you do not hear from [place] after three days.   WHEN TO CALL YOUR DOCTOR: Fever over 101.0 Nausea and/or vomiting. Extreme swelling or bruising. Continued bleeding from incision. Increased pain, redness, or drainage from the incision.  The clinic staff is available to answer your questions during regular business hours.  Please don't hesitate to call and ask to speak to one of the nurses for clinical concerns.  If you have a medical emergency, go to the nearest emergency room or call 911.  A surgeon from [facility] is always on call at the hospital.  For further questions, please visit [email or web address]    Post Anesthesia Home Care Instructions  Activity: Get plenty of rest for the remainder of the day. A responsible individual must stay  with you for 24 hours following the procedure.  For the next 24 hours, DO NOT: -Drive a car -Paediatric nurse -Drink alcoholic beverages -Take any medication unless instructed by your physician -Make any legal decisions or sign important papers.  Meals: Start with liquid foods such as gelatin or soup. Progress to regular foods as tolerated. Avoid greasy, spicy, heavy foods. If nausea  and/or vomiting occur, drink only clear liquids until the nausea and/or vomiting subsides. Call your physician if vomiting continues.  Special Instructions/Symptoms: Your throat may feel dry or sore from the anesthesia or the breathing tube placed in your throat during surgery. If this causes discomfort, gargle with warm salt water. The discomfort should disappear within 24 hours.  If you had a scopolamine patch placed behind your ear for the management of post- operative nausea and/or vomiting:  1. The medication in the patch is effective for 72 hours, after which it should be removed.  Wrap patch in a tissue and discard in the trash. Wash hands thoroughly with soap and water. 2. You may remove the patch earlier than 72 hours if you experience unpleasant side effects which may include dry mouth, dizziness or visual disturbances. 3. Avoid touching the patch. Wash your hands with soap and water after contact with the patch.     *May have Tylenol at 2:30pm today 10/28/21

## 2021-10-28 NOTE — Anesthesia Preprocedure Evaluation (Addendum)
Anesthesia Evaluation  Patient identified by MRN, date of birth, ID band Patient awake    Reviewed: Allergy & Precautions, NPO status , Patient's Chart, lab work & pertinent test results  History of Anesthesia Complications Negative for: history of anesthetic complications  Airway Mallampati: II  TM Distance: >3 FB Neck ROM: Full    Dental no notable dental hx.    Pulmonary asthma , sleep apnea , former smoker,    Pulmonary exam normal        Cardiovascular negative cardio ROS Normal cardiovascular exam  TTE 09/21/21: EF 60-65%, mild MR, bicuspid aortic valve, mild AS   Neuro/Psych  Headaches, Anxiety Depression    GI/Hepatic negative GI ROS, Neg liver ROS,   Endo/Other  Hypothyroidism   Renal/GU negative Renal ROS  negative genitourinary   Musculoskeletal  (+) Arthritis , Fibromyalgia -  Abdominal   Peds  Hematology  (+) Blood dyscrasia, anemia ,   Anesthesia Other Findings RIGHT BREAST CSL  Reproductive/Obstetrics negative OB ROS                           Anesthesia Physical Anesthesia Plan  ASA: 2  Anesthesia Plan: MAC   Post-op Pain Management: Tylenol PO (pre-op)*   Induction:   PONV Risk Score and Plan: Treatment may vary due to age or medical condition, Propofol infusion and Ondansetron  Airway Management Planned: Natural Airway  Additional Equipment: None  Intra-op Plan:   Post-operative Plan:   Informed Consent: I have reviewed the patients History and Physical, chart, labs and discussed the procedure including the risks, benefits and alternatives for the proposed anesthesia with the patient or authorized representative who has indicated his/her understanding and acceptance.       Plan Discussed with: CRNA  Anesthesia Plan Comments: (Patient requests sedation only for her anesthetic and would like to avoid general anesthesia if at all possible. Discussed plan  with surgeon, patient, and CRNA and all are in agreement to start case as MAC and convert to [place] with LMA if needed. All patient's questions answered. Daiva Huge, MD)       Anesthesia Quick Evaluation

## 2021-10-28 NOTE — Op Note (Addendum)
Right Breast Radioactive seed localized excisional biopsy  Indications: This patient presents with history of abnormal right mammogram with core needle biopsy showing complex sclerosing lesion  Pre-operative Diagnosis: abnormal right mammogram    Post-operative Diagnosis: abnormal right mammogram  Surgeon: Stark Klein   Anesthesia: MAC  ASA Class: 2  Procedure Details  The patient was seen in the Holding Room. The risks, benefits, complications, treatment options, and expected outcomes were discussed with the patient. The possibilities of bleeding, infection, the need for additional procedures, failure to diagnose a condition, and creating a complication requiring transfusion or operation were discussed with the patient. The patient concurred with the proposed plan, giving informed consent.  The site of surgery properly noted/marked. The patient was taken to Operating Room # 1, identified, and the procedure verified as right Breast seed localized excisional biopsy. Anesthesia was induced.  The right breast and chest were prepped and draped in standard fashion. A Time Out was held and the above information confirmed.  Local anesthesia was infiltrated into the region.  A transverse lateral incision was made near the previously placed radioactive seed.  Dissection was carried down around the point of maximum signal intensity. The cautery was used to perform the dissection.   The specimen was inked with the margin marker paint kit.  Specimen radiography confirmed inclusion of the mammographic lesion, the clip, and the seed.  The background signal in the breast was zero.  One clip was placed in the breast cavity.  Hemostasis was achieved with cautery.  Additional local was infiltrated into the surrounding tissues.  The wound was irrigated and closed with 3-0 vicryl interrupted deep dermal sutures and 4-0 monocryl running subcuticular suture.      Sterile dressings were applied. At the end of the  operation, all sponge, instrument, and needle counts were correct.  Findings: Seed, clip in specimen.  Posterior margin is pectoralis  Estimated Blood Loss:  min         Specimens: right breast tissue with seed         Complications:  None; patient tolerated the procedure well.         Disposition: PACU - hemodynamically stable.         Condition: stable

## 2021-10-28 NOTE — Interval H&P Note (Signed)
History and Physical Interval Note:  10/28/2021 9:22 AM  Courtney [name]  has presented today for surgery, with the diagnosis of RIGHT BREAST CSL.  The various methods of treatment have been discussed with the patient and family. After consideration of risks, benefits and other options for treatment, the patient has consented to  Procedure(s): RADIOACTIVE SEED GUIDED EXCISIONAL RIGHT BREAST BIOPSY (Right) as a surgical intervention.  The patient's history has been reviewed, patient examined, no change in status, stable for surgery.  I have reviewed the patient's chart and labs.  Questions were answered to the patient's satisfaction.     Stark Klein

## 2021-10-28 NOTE — Transfer of Care (Signed)
Immediate Anesthesia Transfer of Care Note  Patient: Courtney [name]  Procedure(s) Performed: RADIOACTIVE SEED GUIDED EXCISIONAL RIGHT BREAST BIOPSY (Right: Breast)  Patient Location: PACU  Anesthesia Type:MAC  Level of Consciousness: awake, alert  and oriented  Airway & Oxygen Therapy: Patient Spontanous Breathing and Patient connected to face mask oxygen  Post-op Assessment: Report given to RN and Post -op Vital signs reviewed and stable  Post vital signs: Reviewed and stable  Last Vitals:  Vitals Value Taken Time  BP 99/48 10/28/21 1035  Temp 36.4 C 10/28/21 1035  Pulse 58 10/28/21 1043  Resp 11 10/28/21 1044  SpO2 97 % 10/28/21 1043  Vitals shown include unvalidated device data.  Last Pain:  Vitals:   10/28/21 1036  TempSrc:   PainSc: 0-No pain         Complications: No notable events documented.

## 2021-10-30 ENCOUNTER — Encounter (HOSPITAL_BASED_OUTPATIENT_CLINIC_OR_DEPARTMENT_OTHER): Payer: Self-pay | Admitting: General Surgery

## 2021-10-31 LAB — SURGICAL PATHOLOGY

## 2021-11-02 NOTE — Telephone Encounter (Signed)
This has been re ran and approved

## 2021-11-09 ENCOUNTER — Ambulatory Visit (HOSPITAL_COMMUNITY)
Admission: RE | Admit: 2021-11-09 | Discharge: 2021-11-09 | Disposition: A | Discharge status: Home / Self Care | Payer: Medicare Other | Source: Ambulatory Visit | Attending: Family Medicine | Admitting: Family Medicine

## 2021-11-09 ENCOUNTER — Encounter (HOSPITAL_COMMUNITY)
Admission: RE | Admit: 2021-11-09 | Discharge: 2021-11-09 | Disposition: A | Discharge status: Home / Self Care | Payer: Medicare Other | Source: Ambulatory Visit | Attending: Family Medicine | Admitting: Family Medicine

## 2021-11-09 DIAGNOSIS — M19012 Primary osteoarthritis, left shoulder: Secondary | ICD-10-CM | POA: Diagnosis not present

## 2021-11-09 DIAGNOSIS — M25552 Pain in left hip: Secondary | ICD-10-CM | POA: Insufficient documentation

## 2021-11-09 DIAGNOSIS — M170 Bilateral primary osteoarthritis of knee: Secondary | ICD-10-CM | POA: Diagnosis not present

## 2021-11-09 DIAGNOSIS — M160 Bilateral primary osteoarthritis of hip: Secondary | ICD-10-CM | POA: Diagnosis not present

## 2021-11-09 DIAGNOSIS — M069 Rheumatoid arthritis, unspecified: Secondary | ICD-10-CM | POA: Diagnosis not present

## 2021-11-09 MED ORDER — TECHNETIUM TC 99M MEDRONATE IV KIT
20.0000 | PACK | Freq: Once | INTRAVENOUS | Status: AC | PRN
  Administered 2021-11-09: 19.5 via INTRAVENOUS

## 2021-11-14 NOTE — Progress Notes (Unsigned)
[place] Phone: [phone] Subjective:   Courtney [name], am serving as a scribe for Dr. Hulan Saas.   I'm seeing this patient by the request  of:  Courtney Pillar, MD  CC: Hip pain,  [id number]  10/03/2021 Right-sided hip pain.  Patient states that she did have audible pops before this occurred.  Still has intermittent pain when this occurs from time to time.  Discussed with patient and patient also had iliopsoas tendon injections and this could be a potential injury with patient also having some weakness noted with certain movements.  At this point I do feel that a bone scan would be potentially beneficial.  Make sure that there is [name] significant instability.  If normal then likely will be more of the hip flexor tendinitis versus partial tearing and given home exercises that I think will be beneficial.  Patient does not have any type of signs or symptoms of a hernia noted.  Patient has multiple different musculoskeletal complaints over the course of time and is likely undergoing a sacral fusion on the contralateral side in the near future.  We will see if there is anything else we can do and can consider the possibility of further laboratory work-up to see if any other treatment option can help with some of her pain in the long run.  Follow-up again in 6 weeks we will discuss some of her other ailments that we did not have time to address today.  Updated 11/16/2021 Courtney [name] is a 71 y.o. female coming in with complaint of left hip pain. Pain is not improving. Had to increase medications as a result from increase in pain.   Bone Scan IMPRESSION: 1. Degenerative type activity within the bilateral shoulders, left hip, left knee, and thoracic spine 3. Right hip arthroplasty, with mild periprosthetic activity consistent with postsurgical change.  [name] true signs of loosening Patient is going  Imaging from March  of this year from outside facility.  Patient does have an anterior wedge compression fracture at T7 seems to be more chronic.  Patient also had an MRI of the lumbar spine showing postoperative changes noted.  Moderate spinal stenosis still noted at the L4-L5 with a posterior disc osteophyte severe on the left side and moderate to severe on the right side.  Right SI joint fusion appears normal  Since we have seen patient reviewing the chart patient did undergo a radioactive seed guided right breast biopsy and then undergone a sclerosing lesion of the right breast on June 5. Biopsy chronic scarring but [name] signs of carcinoma    Past Medical History:  Diagnosis Date   ADHD    ADD   Allergic to pets    pet dander   Allergies    Anemia    take iron   Anxiety    Arthritis    hands, hip, shoulders back,   Asthma    as a child   Bicuspid aortic valve    bicuspid AV with [name] stenosis or regurgitation 07/4095 echo    Complication of anesthesia    slow to wake up and lingers for a long time   Concussion    Depression    Family history of adverse reaction to anesthesia    mom had skin reaction to anesthesia but cant remember what it was or exactly what happened   Fatigue    Fibromyalgia    Hyperlipidemia    Hypothyroidism  Irregular periods    Joint pain    Joint stiffness    Lower back pain    Menopause    Migraines    [name] longer   Mitral regurgitation    Numbness and tingling    Pollen allergy    Presence of pessary    Ringing in ears    Sleep apnea    does not wear CPAP tapes mouth at night   Snoring    Past Surgical History:  Procedure Laterality Date   ABDOMINAL EXPOSURE N/A 04/12/2020   Procedure: ABDOMINAL EXPOSURE;  Surgeon: [name], MD;  Location: [place];  Service: Vascular;  Laterality: N/A;   ABDOMINOPLASTY     ANTERIOR CERVICAL DECOMP/DISCECTOMY FUSION N/A 06/28/2016   Procedure: ACDF C5-7 ANTERIOR CERVICAL DECOMPRESSION/DISCECTOMY FUSION 2 LEVELS;   Surgeon: Melina Schools, MD;  Location: [place];  Service: Orthopedics;  Laterality: N/A;  Requests 3 hrs   ANTERIOR LUMBAR FUSION N/A 04/12/2020   Procedure: Anterior Lumbar Interbody Fusion - Lumbar four-Lumbar five , posterior instrumented fusion Lumbar four-five lumbar three -four;  Surgeon: Eustace Moore, MD;  Location: [place];  Service: Neurosurgery;  Laterality: N/A;   BREAST BIOPSY Right 09/19/2021   CARPAL TUNNEL RELEASE Bilateral 1996   COLONOSCOPY     DENTAL SURGERY     HERNIA REPAIR     times 2   LAMINECTOMY WITH POSTERIOR LATERAL ARTHRODESIS LEVEL 1 N/A 04/12/2020   Procedure: Posterior Instrumented Fusion Lumbar three to lumbar five;  Surgeon: Eustace Moore, MD;  Location: [place];  Service: Neurosurgery;  Laterality: N/A;   LUMBAR LAMINECTOMY/DECOMPRESSION MICRODISCECTOMY  04/21/2015   Procedure: DECOMPRESSION LUMBAR THREE-LUMBAR FOUR;  Surgeon: Melina Schools, MD;  Location: [place];  Service: Orthopedics;;   LUMBAR LAMINECTOMY/DECOMPRESSION MICRODISCECTOMY N/A 06/20/2017   Procedure: L4-5 decompression, L5-S1 left laminotomy/foraminotomy;  Surgeon: Melina Schools, MD;  Location: [place];  Service: Orthopedics;  Laterality: N/A;  3 hrs   PARATHYROIDECTOMY  2012   RADIOACTIVE SEED GUIDED EXCISIONAL BREAST BIOPSY Right 10/28/2021   Procedure: RADIOACTIVE SEED GUIDED EXCISIONAL RIGHT BREAST BIOPSY;  Surgeon: Stark Klein, MD;  Location: [place];  Service: General;  Laterality: Right;   SACROILIAC JOINT FUSION Right 07/24/2018   Procedure: SACROILIAC JOINT FUSION;  Surgeon: Melina Schools, MD;  Location: [place];  Service: Orthopedics;  Laterality: Right;  90 mins   SHOULDER ARTHROSCOPY W/ ACROMIAL REPAIR Right    TONSILLECTOMY     TOTAL HIP ARTHROPLASTY Right 11/10/2020   Procedure: TOTAL HIP ARTHROPLASTY ANTERIOR APPROACH;  Surgeon: Gaynelle Arabian, MD;  Location: [facility];  Service: Orthopedics;  Laterality: Right;  174mn   Social History   Socioeconomic History    Marital status: Significant Other    Spouse name: Not on file   Number of children: 2   Years of education: Master's    Highest education level: Not on file  Occupational History   Occupation: Retired   Tobacco Use   Smoking status: Former    Packs/day: 0.25    Years: 4.00    Pack years: 1.00    Types: Cigarettes    Quit date: 1976    Years since quitting: 47.4   Smokeless tobacco: Never   Tobacco comments:    social smoker stop at age 71 Vaping Use   Vaping Use: Never used  Substance and Sexual Activity   Alcohol use: Yes    Alcohol/week: 1.0 standard drink    Types: 1 Glasses of wine per week  Comment: socially   Drug use: [name]   Sexual activity: Never  Other Topics Concern   Not on file  Social History Narrative   Lives alone   Caffeine use: 1 cup coffee/day   Social Determinants of Health   Financial Resource Strain: Not on file  Food Insecurity: Not on file  Transportation Needs: Not on file  Physical Activity: Not on file  Stress: Not on file  Social Connections: Not on file   Allergies  Allergen Reactions   Fish Allergy Anaphylaxis    Can tolerate shellfish. Allergic to fish with shells and fins.   Peanuts [Peanut Oil] Anaphylaxis    Can eat cashews, pistachios, and almonds   Family History  Problem Relation Age of Onset   COPD Mother    Cancer Mother    Hypertension Mother    Hyperlipidemia Mother    Heart disease Mother    Thyroid disease Mother    Thyroid disease Sister    Thyroid disease Daughter     Current Outpatient Medications (Endocrine & Metabolic):    levothyroxine (SYNTHROID, LEVOTHROID) 25 MCG tablet, Take 1 1/2 tablets daily (Patient taking differently: Take 37.5 mcg by mouth daily before breakfast. Take 1 1/2 tablets daily)  Current Outpatient Medications (Cardiovascular):    atorvastatin (LIPITOR) 10 MG tablet, Take 10 mg by mouth every 3 (three) days. In the evening   EPINEPHrine (EPIPEN JR) 0.15 MG/0.3ML injection, Inject  0.15 mg into the muscle as needed for anaphylaxis.  Current Outpatient Medications (Respiratory):    fluticasone (FLONASE) 50 MCG/ACT nasal spray, Place 1 spray into both nostrils at bedtime.    loratadine (CLARITIN) 10 MG tablet, Take 10 mg by mouth daily as needed (for seasonal allergies (Spring/Summer)).   Current Outpatient Medications (Analgesics):    acetaminophen (TYLENOL) 500 MG tablet, Take 500 mg by mouth in the morning and at bedtime.   meloxicam (MOBIC) 15 MG tablet, Take 15 mg by mouth daily.   naproxen sodium (ALEVE) 220 MG tablet, Take 220 mg by mouth.   Oxycodone HCl 10 MG TABS, Take 5-7.5 mg by mouth 4 (four) times daily as needed (pain).   oxyCODONE (OXY IR/ROXICODONE) 5 MG immediate release tablet, Take 1 tablet (5 mg total) by mouth every 6 (six) hours as needed for severe pain.  Current Outpatient Medications (Hematological):    FERROUS BISGLYCINATE CHELATE PO, Take 25 mg by mouth daily.   Current Outpatient Medications (Other):    Artificial Tear Solution (GENTEAL TEARS OP), Place 1 drop into both eyes daily as needed (dry eyes).   Ascorbic Acid (VITAMIN C) 1000 MG tablet, Take 1,000 mg by mouth in the morning and at bedtime.   CALCIUM CITRATE PO, Take 1,000 mg by mouth daily.   Carboxymethylcellul-Glycerin (REFRESH OPTIVE OP), Place 1 drop into both eyes daily as needed (dry eyes).   cholecalciferol (VITAMIN D3) 25 MCG (1000 UNIT) tablet, Take 1,000 Units by mouth 2 (two) times daily.   Coenzyme Q10 300 MG CAPS, Take 300 mg by mouth daily.    docusate sodium (COLACE) 100 MG capsule, Take 100 mg by mouth every evening.   DULoxetine (CYMBALTA) 30 MG capsule, Take 30 mg by mouth daily after breakfast.    estradiol (ESTRACE) 0.1 MG/GM vaginal cream, Place 0.1 g vaginally every 3 (three) days.    L-Arginine 1000 MG TABS, Take by mouth.   lactobacillus acidophilus (BACID) TABS tablet, Take 1 tablet by mouth daily.   Lidocaine 4 % PTCH, Apply 1 patch topically daily  as  needed (pain).    MAGNESIUM CITRATE PO*, Take 1,000 mg by mouth 2 (two) times daily.    methocarbamol (ROBAXIN) 500 MG tablet, Take 1 tablet (500 mg total) by mouth every 8 (eight) hours as needed for muscle spasms.   Multiple Vitamins-Minerals (MULTIVITAMIN WITH MINERALS) tablet, Take 1 tablet by mouth every other day.    Omega-3 Fatty Acids (OMEGA 3 PO), Take 2,560 mg by mouth at bedtime.    Psyllium Husk POWD, Take 1 capsule by mouth 2 (two) times daily.   TART CHERRY PO, Take by mouth.   tiZANidine (ZANAFLEX) 2 MG tablet, Take by mouth every 6 (six) hours as needed for muscle spasms. Unsure of dosage   traZODone (DESYREL) 100 MG tablet, Take 30-50 mg by mouth at bedtime.   urea (CARMOL) 40 % CREA, Apply 1 application topically 2 (two) times a week.   Vibegron (GEMTESA PO), Take by mouth.   vitamin E 400 UNIT capsule, Take 400 Units by mouth every 3 (three) days.    Zinc Sulfate (ZINC 15 PO), Take 15 mg by mouth daily.   amoxicillin (AMOXIL) 500 MG tablet, Take 2,000 mg by mouth See admin instructions. Dental procedures * These medications belong to multiple therapeutic classes and are listed under each applicable group.    Review of Systems:  [name] headache, visual changes, nausea, vomiting, diarrhea, constipation, dizziness, abdominal pain, skin rash, fevers, chills, night sweats, weight loss, swollen lymph nodes, joint swelling, chest pain, shortness of breath, mood changes. POSITIVE muscle aches, body aches  Objective  Blood pressure 122/76, pulse 79, height '5\' 4"'$  (1.626 m), weight 131 lb (59.4 kg), SpO2 99 %.   General: [name] apparent distress alert and oriented x3 mood and affect normal, dressed appropriately.  HEENT: Pupils equal, extraocular movements intact  Respiratory: Patient's speak in full sentences and does not appear short of breath  Cardiovascular: [name] lower extremity edema, non tender, [name] erythema  Gait n antalgic gait noted. Patient does have tenderness to palpation of the  paraspinal musculature.  Patient does have tightness noted in the paraspinal musculature.  Patient does have some limited range of motion.  Patient does have more radicular symptoms seeming to be in the L5 aspect of the right leg. Numbness noted over the anterior lateral aspect of the hip.  Pain in the thigh on the right knee. 4-5 strength of dorsiflexion of the feet bilaterally.    Impression and Recommendations:     The above documentation has been reviewed and is accurate and complete Lyndal Pulley, DO

## 2021-11-16 ENCOUNTER — Ambulatory Visit (INDEPENDENT_AMBULATORY_CARE_PROVIDER_SITE_OTHER): Payer: Medicare Other | Admitting: Family Medicine

## 2021-11-16 ENCOUNTER — Other Ambulatory Visit: Payer: Self-pay | Admitting: Family Medicine

## 2021-11-16 VITALS — BP 122/76 | HR 79 | Ht 64.0 in | Wt 131.0 lb

## 2021-11-16 DIAGNOSIS — M5416 Radiculopathy, lumbar region: Secondary | ICD-10-CM

## 2021-11-16 DIAGNOSIS — M5459 Other low back pain: Secondary | ICD-10-CM

## 2021-11-16 DIAGNOSIS — D171 Benign lipomatous neoplasm of skin and subcutaneous tissue of trunk: Secondary | ICD-10-CM

## 2021-11-16 DIAGNOSIS — M48062 Spinal stenosis, lumbar region with neurogenic claudication: Secondary | ICD-10-CM | POA: Diagnosis not present

## 2021-11-16 DIAGNOSIS — Z981 Arthrodesis status: Secondary | ICD-10-CM | POA: Diagnosis not present

## 2021-11-16 NOTE — Assessment & Plan Note (Signed)
I believe the patient is having more radicular symptoms at the moment.  Patient has had significant amount of surgical interventions previously.  At this point I would like patient to avoid another surgery if possible, see if patient can potentially respond to medial branch block at the L5 nerve root based on imaging and patient's symptomatology.  Depending on this medication could be a candidate for radiofrequency ablation but we did discuss with her it depends how she responds to this.  Patient has not been taking the Cymbalta regularly.  Encouraged her to take the 60 mg regularly and see if this would help with more of the pain as well at the moment.  Follow-up with me again in 6 weeks after the injections to see how patient is responding.  Total time reviewing patient's imaging which she did bring today and discussed with patient 41 minutes

## 2021-11-16 NOTE — Patient Instructions (Addendum)
[phone] Will look at imaging when I get a chance I will check in with physician Take Cymbalta '60mg'$  Referral to general surgery for lipoma See me in 2 months

## 2021-11-17 ENCOUNTER — Encounter (HOSPITAL_COMMUNITY): Payer: Self-pay

## 2021-11-28 ENCOUNTER — Other Ambulatory Visit: Payer: Self-pay | Admitting: Family Medicine

## 2021-11-28 ENCOUNTER — Ambulatory Visit
Admission: RE | Admit: 2021-11-28 | Discharge: 2021-11-28 | Disposition: A | Discharge status: Home / Self Care | Payer: Medicare Other | Source: Ambulatory Visit | Attending: Family Medicine | Admitting: Family Medicine

## 2021-11-28 DIAGNOSIS — M5459 Other low back pain: Secondary | ICD-10-CM

## 2021-11-28 DIAGNOSIS — M47817 Spondylosis without myelopathy or radiculopathy, lumbosacral region: Secondary | ICD-10-CM | POA: Diagnosis not present

## 2021-11-28 NOTE — Discharge Instructions (Signed)

## 2021-12-02 ENCOUNTER — Ambulatory Visit
Admission: RE | Admit: 2021-12-02 | Discharge: 2021-12-02 | Disposition: A | Discharge status: Home / Self Care | Payer: Medicare Other | Source: Ambulatory Visit | Attending: Family Medicine | Admitting: Family Medicine

## 2021-12-02 ENCOUNTER — Other Ambulatory Visit: Payer: Self-pay | Admitting: Family Medicine

## 2021-12-02 DIAGNOSIS — M5459 Other low back pain: Secondary | ICD-10-CM

## 2021-12-02 DIAGNOSIS — M545 Low back pain, unspecified: Secondary | ICD-10-CM | POA: Diagnosis not present

## 2021-12-02 MED ORDER — FENTANYL CITRATE PF 50 MCG/ML IJ SOSY
25.0000 ug | PREFILLED_SYRINGE | INTRAMUSCULAR | Status: DC | PRN
  Administered 2021-12-02 (×4): 25 ug via INTRAVENOUS

## 2021-12-02 MED ORDER — MIDAZOLAM HCL 2 MG/2ML IJ SOLN
1.0000 mg | INTRAMUSCULAR | Status: DC | PRN
  Administered 2021-12-02: 1 mg via INTRAVENOUS
  Administered 2021-12-02 (×2): 0.5 mg via INTRAVENOUS

## 2021-12-02 MED ORDER — SODIUM CHLORIDE 0.9 % IV SOLN: INTRAVENOUS | Status: DC

## 2021-12-02 MED ORDER — KETOROLAC TROMETHAMINE 30 MG/ML IJ SOLN
30.0000 mg | Freq: Once | INTRAMUSCULAR | Status: AC
  Administered 2021-12-02: 30 mg via INTRAVENOUS

## 2021-12-02 MED ORDER — METHYLPREDNISOLONE ACETATE 40 MG/ML INJ SUSP (RADIOLOG
80.0000 mg | Freq: Once | INTRAMUSCULAR | Status: AC
  Administered 2021-12-02: 80 mg via INTRALESIONAL

## 2022-01-04 ENCOUNTER — Ambulatory Visit: Payer: Self-pay | Admitting: Orthopedic Surgery

## 2022-01-04 DIAGNOSIS — L578 Other skin changes due to chronic exposure to nonionizing radiation: Secondary | ICD-10-CM | POA: Diagnosis not present

## 2022-01-04 DIAGNOSIS — L918 Other hypertrophic disorders of the skin: Secondary | ICD-10-CM | POA: Diagnosis not present

## 2022-01-04 DIAGNOSIS — B351 Tinea unguium: Secondary | ICD-10-CM | POA: Diagnosis not present

## 2022-01-04 DIAGNOSIS — L821 Other seborrheic keratosis: Secondary | ICD-10-CM | POA: Diagnosis not present

## 2022-01-04 DIAGNOSIS — L814 Other melanin hyperpigmentation: Secondary | ICD-10-CM | POA: Diagnosis not present

## 2022-01-04 DIAGNOSIS — D225 Melanocytic nevi of trunk: Secondary | ICD-10-CM | POA: Diagnosis not present

## 2022-01-04 NOTE — Pre-Procedure Instructions (Signed)
Surgical Instructions    Your procedure is scheduled on Thursday, August 3rd.  Report to [facility] Main Entrance "A" at 10:00 A.M., then check in with the Admitting office.  Call this number if you have problems the morning of surgery:  [phone]   If you have any questions prior to your surgery date call [phone]: Open Monday-Friday 8am-4pm    Remember:  Do not eat or drink after midnight the night before your surgery     Take these medicines the morning of surgery with A SIP OF WATER  acetaminophen (TYLENOL)  atorvastatin (LIPITOR) DULoxetine (CYMBALTA)  levothyroxine (SYNTHROID, LEVOTHROID) Oxycodone HCl    If needed: Artificial Tear Solution (GENTEAL TEARS OP) Carboxymethylcellul-Glycerin (REFRESH OPTIVE OP) EPINEPHrine (EPIPEN JR) loratadine (CLARITIN)  methocarbamol (ROBAXIN)    As of today, STOP taking any Aspirin (unless otherwise instructed by your surgeon) Aleve, Naproxen, Ibuprofen, Motrin, Advil, Goody's, BC's, all herbal medications, fish oil, and all vitamins. This includes diclofenac Sodium (VOLTAREN) 1 % GEL and meloxicam (MOBIC).                     Do NOT Smoke (Tobacco/Vaping) for 24 hours prior to your procedure.  If you use a CPAP at night, you may bring your mask/headgear for your overnight stay.   Contacts, glasses, piercing's, hearing aid's, dentures or partials may not be worn into surgery, please bring cases for these belongings.    For patients admitted to the hospital, discharge time will be determined by your treatment team.   Patients discharged the day of surgery will not be allowed to drive home, and someone needs to stay with them for 24 hours.  SURGICAL WAITING ROOM VISITATION Patients having surgery or a procedure may have no more than 2 support people in the waiting area - these visitors may rotate.   Children under the age of 64 must have an adult with them who is not the patient. If the patient needs to stay at the hospital  during part of their recovery, the visitor guidelines for inpatient rooms apply. Pre-op nurse will coordinate an appropriate time for 1 support person to accompany patient in pre-op.  This support person may not rotate.   Please refer to the [facility] website for the visitor guidelines for Inpatients (after your surgery is over and you are in a regular room).    Special instructions:   [place]- Preparing For Surgery  Before surgery, you can play an important role. Because skin is not sterile, your skin needs to be as free of germs as possible. You can reduce the number of germs on your skin by washing with CHG (chlorahexidine gluconate) Soap before surgery.  CHG is an antiseptic cleaner which kills germs and bonds with the skin to continue killing germs even after washing.    Oral Hygiene is also important to reduce your risk of infection.  Remember - BRUSH YOUR TEETH THE MORNING OF SURGERY WITH YOUR REGULAR TOOTHPASTE  Please do not use if you have an allergy to CHG or antibacterial soaps. If your skin becomes reddened/irritated stop using the CHG.  Do not shave (including legs and underarms) for at least 48 hours prior to first CHG shower. It is OK to shave your face.  Please follow these instructions carefully.   Shower the NIGHT BEFORE SURGERY and the MORNING OF SURGERY  If you chose to wash your hair, wash your hair first as usual with your normal shampoo.  After you shampoo, rinse  your hair and body thoroughly to remove the shampoo.  Use CHG Soap as you would any other liquid soap. You can apply CHG directly to the skin and wash gently with a scrungie or a clean washcloth.   Apply the CHG Soap to your body ONLY FROM THE NECK DOWN.  Do not use on open wounds or open sores. Avoid contact with your eyes, ears, mouth and genitals (private parts). Wash Face and genitals (private parts)  with your normal soap.   Wash thoroughly, paying special attention to the area where your surgery  will be performed.  Thoroughly rinse your body with warm water from the neck down.  DO NOT shower/wash with your normal soap after using and rinsing off the CHG Soap.  Pat yourself dry with a CLEAN TOWEL.  Wear CLEAN PAJAMAS to bed the night before surgery  Place CLEAN SHEETS on your bed the night before your surgery  DO NOT SLEEP WITH PETS.   Day of Surgery: Take a shower with CHG soap. Do not wear jewelry or makeup Do not wear lotions, powders, perfumes, or deodorant. Do not shave 48 hours prior to surgery.   Do not bring valuables to the hospital. [facility] is not responsible for any belongings or valuables. Do not wear nail polish, gel polish, artificial nails, or any other type of covering on natural nails (fingers and toes) If you have artificial nails or gel coating that need to be removed by a nail salon, please have this removed prior to surgery. Artificial nails or gel coating may interfere with anesthesia's ability to adequately monitor your vital signs. Wear Clean/Comfortable clothing the morning of surgery Remember to brush your teeth WITH YOUR REGULAR TOOTHPASTE.   Please read over the following fact sheets that you were given.    If you received a COVID test during your pre-op visit  it is requested that you wear a mask when out in public, stay away from anyone that may not be feeling well and notify your surgeon if you develop symptoms. If you have been in contact with anyone that has tested positive in the last 10 days please notify you surgeon.

## 2022-01-05 ENCOUNTER — Encounter (HOSPITAL_COMMUNITY)
Admission: RE | Admit: 2022-01-05 | Discharge: 2022-01-05 | Disposition: A | Discharge status: Home / Self Care | Payer: Medicare Other | Source: Ambulatory Visit | Attending: Orthopedic Surgery | Admitting: Orthopedic Surgery

## 2022-01-05 ENCOUNTER — Encounter (HOSPITAL_COMMUNITY): Payer: Self-pay

## 2022-01-05 ENCOUNTER — Other Ambulatory Visit: Payer: Self-pay

## 2022-01-05 VITALS — BP 117/56 | HR 84 | Temp 98.1°F | Resp 17 | Ht 64.0 in | Wt 137.2 lb

## 2022-01-05 DIAGNOSIS — G8929 Other chronic pain: Secondary | ICD-10-CM | POA: Diagnosis not present

## 2022-01-05 DIAGNOSIS — Z87891 Personal history of nicotine dependence: Secondary | ICD-10-CM | POA: Diagnosis not present

## 2022-01-05 DIAGNOSIS — K219 Gastro-esophageal reflux disease without esophagitis: Secondary | ICD-10-CM | POA: Insufficient documentation

## 2022-01-05 DIAGNOSIS — M549 Dorsalgia, unspecified: Secondary | ICD-10-CM | POA: Diagnosis not present

## 2022-01-05 DIAGNOSIS — Z01812 Encounter for preprocedural laboratory examination: Secondary | ICD-10-CM | POA: Diagnosis not present

## 2022-01-05 DIAGNOSIS — G4733 Obstructive sleep apnea (adult) (pediatric): Secondary | ICD-10-CM | POA: Insufficient documentation

## 2022-01-05 DIAGNOSIS — Q231 Congenital insufficiency of aortic valve: Secondary | ICD-10-CM | POA: Insufficient documentation

## 2022-01-05 DIAGNOSIS — Z01818 Encounter for other preprocedural examination: Secondary | ICD-10-CM

## 2022-01-05 DIAGNOSIS — E785 Hyperlipidemia, unspecified: Secondary | ICD-10-CM | POA: Diagnosis not present

## 2022-01-05 DIAGNOSIS — D649 Anemia, unspecified: Secondary | ICD-10-CM | POA: Diagnosis not present

## 2022-01-05 DIAGNOSIS — I340 Nonrheumatic mitral (valve) insufficiency: Secondary | ICD-10-CM | POA: Insufficient documentation

## 2022-01-05 DIAGNOSIS — M797 Fibromyalgia: Secondary | ICD-10-CM | POA: Insufficient documentation

## 2022-01-05 LAB — CBC
HCT: 34.9 % — ABNORMAL LOW (ref 36.0–46.0)
Hemoglobin: 11.7 g/dL — ABNORMAL LOW (ref 12.0–15.0)
MCH: 32.9 pg (ref 26.0–34.0)
MCHC: 33.5 g/dL (ref 30.0–36.0)
MCV: 98 fL (ref 80.0–100.0)
Platelets: 152 10*3/uL (ref 150–400)
RBC: 3.56 MIL/uL — ABNORMAL LOW (ref 3.87–5.11)
RDW: 11.5 % (ref 11.5–15.5)
WBC: 4.6 10*3/uL (ref 4.0–10.5)
nRBC: 0 % (ref 0.0–0.2)

## 2022-01-05 LAB — TYPE AND SCREEN
ABO/RH(D): O POS
Antibody Screen: NEGATIVE

## 2022-01-05 LAB — BASIC METABOLIC PANEL
Anion gap: 5 (ref 5–15)
BUN: 18 mg/dL (ref 8–23)
CO2: 30 mmol/L (ref 22–32)
Calcium: 10.4 mg/dL — ABNORMAL HIGH (ref 8.9–10.3)
Chloride: 104 mmol/L (ref 98–111)
Creatinine, Ser: 0.82 mg/dL (ref 0.44–1.00)
GFR, Estimated: >60 mL/min (ref >60)
Glucose, Bld: 99 mg/dL (ref 70–99)
Potassium: 5.1 mmol/L (ref 3.5–5.1)
Sodium: 139 mmol/L (ref 135–145)

## 2022-01-05 LAB — SURGICAL PCR SCREEN
MRSA, PCR: NEGATIVE
Staphylococcus aureus: NEGATIVE

## 2022-01-05 NOTE — Progress Notes (Signed)
PCP - Dr. Kelton Pillar Cardiologist - Dr. [name]  PPM/ICD - denies   Chest x-ray - 04/08/20 EKG - 09/02/21 Stress Test - denies ECHO - 09/21/21 Cardiac Cath - denies  Sleep Study - Around 2020 per pt, OSA+ CPAP - pt does not wear, she says "she tapes her lips instead"  DM- denies  ASA/Blood Thinner Instructions: n/a   ERAS Protcol - no, NPO   COVID TEST- n/a   Anesthesia review: yes, cardiac hx  Patient denies shortness of breath, fever, cough and chest pain at PAT appointment   All instructions explained to the patient, with a verbal understanding of the material. Patient agrees to go over the instructions while at home for a better understanding. The opportunity to ask questions was provided.

## 2022-01-06 ENCOUNTER — Encounter (HOSPITAL_COMMUNITY): Payer: Self-pay

## 2022-01-06 NOTE — Anesthesia Preprocedure Evaluation (Signed)
Anesthesia Evaluation  Patient identified by MRN, date of birth, ID band Patient awake    Reviewed: Allergy & Precautions, NPO status , Patient's Chart, lab work & pertinent test results  Airway Mallampati: I       Dental no notable dental hx.    Pulmonary sleep apnea and Continuous Positive Airway Pressure Ventilation , former smoker,    Pulmonary exam normal        Cardiovascular negative cardio ROS Normal cardiovascular exam+ Valvular Problems/Murmurs (Mild AS, mild MR) AS and MR   Echo 09/21/21: IMPRESSIONS  1. Left ventricular ejection fraction, by estimation, is 60 to 65%. The  left ventricle has normal function. The left ventricle has no regional  wall motion abnormalities. Left ventricular diastolic function could not  be evaluated.  2. Right ventricular systolic function is normal. The right ventricular  size is normal. There is normal pulmonary artery systolic pressure. The  estimated right ventricular systolic pressure is 44.0 mmHg.  3. The mitral valve is normal in structure. Mild mitral valve  regurgitation.  4. The aortic valve is bicuspid. There is moderate calcification of the  aortic valve. There is moderate thickening of the aortic valve. Aortic  valve regurgitation is not visualized. Mild aortic valve stenosis. Aortic  valve mean gradient measures 11.0  mmHg. Aortic valve Vmax measures 2.31 m/s.  5. No coarctation is seen. Ao Root diam: 2.90 cm. Ao Asc diam: 3.40 cm.     Neuro/Psych  Headaches, PSYCHIATRIC DISORDERS Anxiety Depression    GI/Hepatic GERD  ,  Endo/Other  Hypothyroidism   Renal/GU Lab Results      Component                Value               Date                      CREATININE               0.82                01/05/2022               K                        5.1                 01/05/2022                   Musculoskeletal  (+) Arthritis , Osteoarthritis,  Fibromyalgia -   Abdominal Normal abdominal exam  (+)   Peds  Hematology  (+) Blood dyscrasia, anemia , Lab Results      Component                Value               Date                          HGB                      11.7 (L)            01/05/2022                HCT  34.9 (L)            01/05/2022                PLT                      152                 01/05/2022              Anesthesia Other Findings   Reproductive/Obstetrics                          Anesthesia Physical Anesthesia Plan  ASA: 2  Anesthesia Plan: General   Post-op Pain Management: Ketamine IV* and Minimal or no pain anticipated   Induction: Intravenous  PONV Risk Score and Plan: 4 or greater and Treatment may vary due to age or medical condition, Ondansetron, Midazolam and Dexamethasone  Airway Management Planned: Oral ETT  Additional Equipment: None  Intra-op Plan:   Post-operative Plan: Extubation in OR  Informed Consent:     Dental advisory given  Plan Discussed with: CRNA  Anesthesia Plan Comments: (PAT note written 01/06/2022 by Myra Gianotti, PA-C. )      Anesthesia Quick Evaluation

## 2022-01-06 NOTE — Progress Notes (Signed)
Anesthesia Chart Review:  Case: [id number] Date/Time: 01/12/22 1145   Procedures:      LEFT SACROILIAC JOINT FUSION AND SPINAL CORD STIMULATOR PLACEMENT (Left)     LUMBAR SPINAL CORD STIMULATOR INSERTION   Anesthesia type: General   Pre-op diagnosis: Left sacroiliac joint dysfunction, chronic back pain   Location: [facility] OR ROOM 04 / [place] OR   Surgeons: Melina Schools, MD       DISCUSSION: Patient is a 71 year old female scheduled for the above procedure.  History includes former smoker (quit 06/12/74), HLD, bicuspid aortic valve (with mild AS, no coarctation 09/21/21 echo), mitral regurgitation (mild MR 09/21/21), fibromyalgia, GERD, OSA (intolerant to CPAP, instead uses paper tape to tape her lips), parathyroidectomy (2012), anemia, right breast lumpectomy 10/28/21 (pathology: complex sclerosing lesion & sclerosed papilloma a/w florid usual duct hyperplasia, no malignancy), back surgeries (L3-4 decompression 04/21/15; L5-S1 laminectomy/foraminotomy 06/20/17; SI joint fusion 07/24/18; right L5-S1 facet radiofrequency ablation 12/02/21), neck surgery (C5-7 ACDF 06/28/16), osteoarthritis (11/10/20). Reported she is sensitive to anesthesia.    Last cardiology evaluation was on 09/02/21 by Ambrose Pancoast, NP for preoperative evaluation per Dr. Rolena Infante. He wrote: "Ms. First presented today for preoperative evaluation for insertion of pain stimulator.  Based on revised cardiac risk index this procedure is considered low risk.  She denies any chest pain, dizziness, shortness of breath.  Clearance will be sent to [facility] at the attention of her surgery today. We will also obtain updated echocardiogram, but this does not necessarily need to be done prior to surgery. -According to the Revised Cardiac Risk Index (RCRI), her Perioperative Risk of Major Cardiac Event is (%): 0.4 Her Functional Capacity in METs is: 6.61 according to the Duke Activity Status Index (DASI)." Continue to follow bicuspid AV clinically.  Screening  CTA 10/2019 showed no evidence of aortic aneurysm.  Echo was repeated on 09/21/21 and showed LVEF 60-65%, no regional wall motion abnormalities, normal RV systolic function, RVSP 73.5 mmHg, mild MR, bicuspid aortic valve with moderate calcification, mild AS with mean gradient 11.0 mmHg, ascending aorta 3.4 cm no coarctation seen.  Anesthesia team to evaluate on the day of surgery.   VS: BP (!) 117/56   Pulse 84   Temp 36.7 C (Oral)   Resp 17   Ht '5\' 4"'$  (1.626 m)   Wt 62.2 kg   LMP  (LMP Unknown)   SpO2 99%   BMI 23.55 kg/m   PROVIDERS: Kelton Pillar, MD is PCP  [name], MD is cardiologist   LABS: Labs reviewed: Acceptable for surgery. Ca 10.4, previously 10.3. (all labs ordered are listed, but only abnormal results are displayed)  Labs Reviewed  BASIC METABOLIC PANEL - Abnormal; Notable for the following components:      Result Value   Calcium 10.4 (*)    All other components within normal limits  CBC - Abnormal; Notable for the following components:   RBC 3.56 (*)    Hemoglobin 11.7 (*)    HCT 34.9 (*)    All other components within normal limits  SURGICAL PCR SCREEN  TYPE AND SCREEN    Sleep Study/HST 07/17/18: IMPRESSIONS - Severe obstructive sleep apnea occurred during this study (AHI = 37.2/h). - No significant central sleep apnea occurred during this study (CAI = 0.0/h). - Moderate oxygen desaturation was noted during this study (Min O2 = 82%). Mean 92%. - Patient snored. RECOMMENDATIONS - Suggest CPAP titration sleep study or autopap.   EKG: 09/02/21: SR   CV: Echo 09/21/21: IMPRESSIONS  1. Left ventricular ejection fraction, by estimation, is 60 to 65%. The  left ventricle has normal function. The left ventricle has no regional  wall motion abnormalities. Left ventricular diastolic function could not  be evaluated.   2. Right ventricular systolic function is normal. The right ventricular  size is normal. There is normal pulmonary artery  systolic pressure. The  estimated right ventricular systolic pressure is 85.8 mmHg.   3. The mitral valve is normal in structure. Mild mitral valve  regurgitation.   4. The aortic valve is bicuspid. There is moderate calcification of the  aortic valve. There is moderate thickening of the aortic valve. Aortic  valve regurgitation is not visualized. Mild aortic valve stenosis. Aortic  valve mean gradient measures 11.0  mmHg. Aortic valve Vmax measures 2.31 m/s.   5. No coarctation is seen. Ao Root diam: 2.90 cm. Ao Asc diam:  3.40 cm.    Past Medical History:  Diagnosis Date   ADHD    ADD   Allergic to pets    pet dander   Allergies    Anemia    take iron   Anxiety    Arthritis    hands, hip, shoulders back,   Asthma    as a child   Bicuspid aortic valve    bicuspid AV with no stenosis or regurgitation 01/5026 echo    Complication of anesthesia    slow to wake up and lingers for a long time   Concussion    Depression    Family history of adverse reaction to anesthesia    mom had skin reaction to anesthesia but cant remember what it was or exactly what happened   Fatigue    Fibromyalgia    GERD (gastroesophageal reflux disease)    Hyperlipidemia    Hypothyroidism    Irregular periods    Joint pain    Joint stiffness    Lower back pain    Menopause    Migraines    no longer   Mitral regurgitation    Numbness and tingling    Pollen allergy    Presence of pessary    Ringing in ears    Sleep apnea    does not wear CPAP tapes mouth at night   Snoring     Past Surgical History:  Procedure Laterality Date   ABDOMINAL EXPOSURE N/A 04/12/2020   Procedure: ABDOMINAL EXPOSURE;  Surgeon: [name], MD;  Location: [facility];  Service: Vascular;  Laterality: N/A;   ABDOMINOPLASTY     ANTERIOR CERVICAL DECOMP/DISCECTOMY FUSION N/A 06/28/2016   Procedure: ACDF C5-7 ANTERIOR CERVICAL DECOMPRESSION/DISCECTOMY FUSION 2 LEVELS;  Surgeon: Melina Schools, MD;  Location: [place];  Service: Orthopedics;  Laterality: N/A;  Requests 3 hrs   ANTERIOR LUMBAR FUSION N/A 04/12/2020   Procedure: Anterior Lumbar Interbody Fusion - Lumbar four-Lumbar five , posterior instrumented fusion Lumbar four-five lumbar three -four;  Surgeon: Eustace Moore, MD;  Location: [place];  Service: Neurosurgery;  Laterality: N/A;   BREAST BIOPSY Right 09/19/2021   CARPAL TUNNEL RELEASE Bilateral 1996   COLONOSCOPY     DENTAL SURGERY     HERNIA REPAIR     times 2   LAMINECTOMY WITH POSTERIOR LATERAL ARTHRODESIS LEVEL 1 N/A 04/12/2020   Procedure: Posterior Instrumented Fusion Lumbar three to lumbar five;  Surgeon: Eustace Moore, MD;  Location: [place];  Service: Neurosurgery;  Laterality: N/A;   LUMBAR LAMINECTOMY/DECOMPRESSION MICRODISCECTOMY  04/21/2015   Procedure: DECOMPRESSION LUMBAR THREE-LUMBAR  FOUR;  Surgeon: Melina Schools, MD;  Location: [place];  Service: Orthopedics;;   LUMBAR LAMINECTOMY/DECOMPRESSION MICRODISCECTOMY N/A 06/20/2017   Procedure: L4-5 decompression, L5-S1 left laminotomy/foraminotomy;  Surgeon: Melina Schools, MD;  Location: [place];  Service: Orthopedics;  Laterality: N/A;  3 hrs   PARATHYROIDECTOMY  2012   RADIOACTIVE SEED GUIDED EXCISIONAL BREAST BIOPSY Right 10/28/2021   Procedure: RADIOACTIVE SEED GUIDED EXCISIONAL RIGHT BREAST BIOPSY;  Surgeon: Stark Klein, MD;  Location: [place];  Service: General;  Laterality: Right;   SACROILIAC JOINT FUSION Right 07/24/2018   Procedure: SACROILIAC JOINT FUSION;  Surgeon: Melina Schools, MD;  Location: [place];  Service: Orthopedics;  Laterality: Right;  90 mins   SHOULDER ARTHROSCOPY W/ ACROMIAL REPAIR Right    TONSILLECTOMY     TOTAL HIP ARTHROPLASTY Right 11/10/2020   Procedure: TOTAL HIP ARTHROPLASTY ANTERIOR APPROACH;  Surgeon: Gaynelle Arabian, MD;  Location: [facility];  Service: Orthopedics;  Laterality: Right;  17mn    MEDICATIONS:  acetaminophen (TYLENOL) 500 MG tablet   amoxicillin (AMOXIL) 500 MG  tablet   Artificial Tear Solution (GENTEAL TEARS OP)   Ascorbic Acid (VITAMIN C) 1000 MG tablet   atorvastatin (LIPITOR) 10 MG tablet   CALCIUM CITRATE PO   Carboxymethylcellul-Glycerin (REFRESH OPTIVE OP)   cholecalciferol (VITAMIN D3) 25 MCG (1000 UNIT) tablet   clobetasol ointment (TEMOVATE) 0.05 %   Coenzyme Q10 300 MG CAPS   diclofenac Sodium (VOLTAREN) 1 % GEL   docusate sodium (COLACE) 100 MG capsule   DULoxetine (CYMBALTA) 30 MG capsule   EPINEPHrine (EPIPEN JR) 0.15 MG/0.3ML injection   estradiol (ESTRACE) 0.1 MG/GM vaginal cream   famotidine (PEPCID) 20 MG tablet   FERROUS BISGLYCINATE CHELATE PO   ferrous sulfate 325 (65 FE) MG tablet   fluticasone (FLONASE) 50 MCG/ACT nasal spray   L-Arginine 1000 MG TABS   lactobacillus acidophilus (BACID) TABS tablet   levothyroxine (SYNTHROID, LEVOTHROID) 25 MCG tablet   Lidocaine 4 % PTCH   loratadine (CLARITIN) 10 MG tablet   MAGNESIUM CITRATE PO   meloxicam (MOBIC) 15 MG tablet   methocarbamol (ROBAXIN) 500 MG tablet   Multiple Vitamins-Minerals (MULTIVITAMIN WITH MINERALS) tablet   naproxen sodium (ALEVE) 220 MG tablet   Omega-3 Fatty Acids (FISH OIL ULTRA) 1400 MG CAPS   oxyCODONE (OXY IR/ROXICODONE) 5 MG immediate release tablet   Oxycodone HCl 10 MG TABS   psyllium (REGULOID) 0.52 g capsule   Tart Cherry 1200 MG CAPS   traZODone (DESYREL) 100 MG tablet   urea (CARMOL) 40 % CREA   Vibegron 75 MG TABS   vitamin E 400 UNIT capsule   Zinc Sulfate (ZINC 15 PO)   No current facility-administered medications for this encounter.    AMyra Gianotti PA-C Surgical Short Stay/Anesthesiology MAb[facility]ene Endoscopy CenterPhone [phone] HospitalPhone ([phone]/28/2023 3:49 PM

## 2022-01-12 ENCOUNTER — Other Ambulatory Visit: Payer: Self-pay

## 2022-01-12 ENCOUNTER — Observation Stay (HOSPITAL_COMMUNITY)
Admission: RE | Admit: 2022-01-12 | Discharge: 2022-01-13 | Disposition: A | Discharge status: Home / Self Care | Payer: Medicare Other | Attending: Orthopedic Surgery | Admitting: Orthopedic Surgery

## 2022-01-12 ENCOUNTER — Ambulatory Visit (HOSPITAL_COMMUNITY): Payer: Medicare Other | Admitting: Vascular Surgery

## 2022-01-12 ENCOUNTER — Ambulatory Visit (HOSPITAL_BASED_OUTPATIENT_CLINIC_OR_DEPARTMENT_OTHER): Payer: Medicare Other | Admitting: Anesthesiology

## 2022-01-12 ENCOUNTER — Encounter (HOSPITAL_COMMUNITY): Payer: Self-pay | Admitting: Orthopedic Surgery

## 2022-01-12 ENCOUNTER — Encounter (HOSPITAL_COMMUNITY)
Admission: RE | Disposition: A | Discharge status: Home / Self Care | Payer: Self-pay | Source: Home / Self Care | Attending: Orthopedic Surgery

## 2022-01-12 ENCOUNTER — Ambulatory Visit (HOSPITAL_COMMUNITY): Payer: Medicare Other

## 2022-01-12 DIAGNOSIS — M961 Postlaminectomy syndrome, not elsewhere classified: Secondary | ICD-10-CM | POA: Diagnosis not present

## 2022-01-12 DIAGNOSIS — M9904 Segmental and somatic dysfunction of sacral region: Secondary | ICD-10-CM | POA: Diagnosis not present

## 2022-01-12 DIAGNOSIS — Z0389 Encounter for observation for other suspected diseases and conditions ruled out: Secondary | ICD-10-CM | POA: Diagnosis not present

## 2022-01-12 DIAGNOSIS — Z79899 Other long term (current) drug therapy: Secondary | ICD-10-CM | POA: Insufficient documentation

## 2022-01-12 DIAGNOSIS — E039 Hypothyroidism, unspecified: Secondary | ICD-10-CM | POA: Diagnosis not present

## 2022-01-12 DIAGNOSIS — M4328 Fusion of spine, sacral and sacrococcygeal region: Secondary | ICD-10-CM | POA: Diagnosis not present

## 2022-01-12 DIAGNOSIS — M461 Sacroiliitis, not elsewhere classified: Principal | ICD-10-CM | POA: Insufficient documentation

## 2022-01-12 DIAGNOSIS — Z981 Arthrodesis status: Secondary | ICD-10-CM | POA: Diagnosis not present

## 2022-01-12 DIAGNOSIS — M545 Low back pain, unspecified: Secondary | ICD-10-CM | POA: Insufficient documentation

## 2022-01-12 DIAGNOSIS — G8929 Other chronic pain: Secondary | ICD-10-CM | POA: Diagnosis present

## 2022-01-12 DIAGNOSIS — J45909 Unspecified asthma, uncomplicated: Secondary | ICD-10-CM | POA: Diagnosis not present

## 2022-01-12 DIAGNOSIS — M533 Sacrococcygeal disorders, not elsewhere classified: Secondary | ICD-10-CM | POA: Diagnosis not present

## 2022-01-12 HISTORY — PX: SPINAL CORD STIMULATOR INSERTION: SHX5378

## 2022-01-12 HISTORY — PX: SACROILIAC JOINT FUSION: SHX6088

## 2022-01-12 SURGERY — SACROILIAC JOINT FUSION
Anesthesia: General | Laterality: Left

## 2022-01-12 MED ORDER — TRANEXAMIC ACID-NACL 1000-0.7 MG/100ML-% IV SOLN
INTRAVENOUS | Status: DC | PRN
  Administered 2022-01-12: 1000 mg via INTRAVENOUS

## 2022-01-12 MED ORDER — LIDOCAINE 2% (20 MG/ML) 5 ML SYRINGE
INTRAMUSCULAR | Status: DC | PRN
  Administered 2022-01-12: 80 mg via INTRAVENOUS

## 2022-01-12 MED ORDER — THROMBIN 20000 UNITS EX KIT
PACK | CUTANEOUS | Status: AC
  Filled 2022-01-12: qty 1

## 2022-01-12 MED ORDER — FENTANYL CITRATE (PF) 250 MCG/5ML IJ SOLN
INTRAMUSCULAR | Status: DC | PRN
  Administered 2022-01-12: 100 ug via INTRAVENOUS
  Administered 2022-01-12: 50 ug via INTRAVENOUS

## 2022-01-12 MED ORDER — MENTHOL 3 MG MT LOZG: 1.0000 | LOZENGE | OROMUCOSAL | Status: DC | PRN

## 2022-01-12 MED ORDER — BUPIVACAINE-EPINEPHRINE (PF) 0.25% -1:200000 IJ SOLN
INTRAMUSCULAR | Status: DC | PRN
  Administered 2022-01-12 (×2): 10 mL

## 2022-01-12 MED ORDER — MEPERIDINE HCL 25 MG/ML IJ SOLN: 6.2500 mg | INTRAMUSCULAR | Status: DC | PRN

## 2022-01-12 MED ORDER — HYDROMORPHONE HCL 1 MG/ML IJ SOLN: 0.5000 mg | INTRAMUSCULAR | Status: DC | PRN

## 2022-01-12 MED ORDER — LACTATED RINGERS IV SOLN: INTRAVENOUS | Status: DC

## 2022-01-12 MED ORDER — PHENYLEPHRINE 80 MCG/ML (10ML) SYRINGE FOR IV PUSH (FOR BLOOD PRESSURE SUPPORT)
PREFILLED_SYRINGE | INTRAVENOUS | Status: DC | PRN
  Administered 2022-01-12 (×3): 160 ug via INTRAVENOUS

## 2022-01-12 MED ORDER — ACETAMINOPHEN 650 MG RE SUPP: 650.0000 mg | RECTAL | Status: DC | PRN

## 2022-01-12 MED ORDER — ACETAMINOPHEN 10 MG/ML IV SOLN: 1000.0000 mg | Freq: Once | INTRAVENOUS | Status: DC | PRN

## 2022-01-12 MED ORDER — FLEET ENEMA 7-19 GM/118ML RE ENEM
1.0000 | ENEMA | Freq: Once | RECTAL | Status: DC | PRN
Start: 2022-01-12 — End: 2022-01-13

## 2022-01-12 MED ORDER — CHLORHEXIDINE GLUCONATE 0.12 % MT SOLN
OROMUCOSAL | Status: AC
  Administered 2022-01-12: 15 mL via OROMUCOSAL
  Filled 2022-01-12: qty 15

## 2022-01-12 MED ORDER — PHENOL 1.4 % MT LIQD: 1.0000 | OROMUCOSAL | Status: DC | PRN

## 2022-01-12 MED ORDER — SODIUM CHLORIDE 0.9% FLUSH: 3.0000 mL | INTRAVENOUS | Status: DC | PRN

## 2022-01-12 MED ORDER — ONDANSETRON HCL 4 MG PO TABS: 4.0000 mg | ORAL_TABLET | Freq: Four times a day (QID) | ORAL | Status: DC | PRN

## 2022-01-12 MED ORDER — SURGIFLO WITH THROMBIN (HEMOSTATIC MATRIX KIT) OPTIME
TOPICAL | Status: DC | PRN
  Administered 2022-01-12: 1 via TOPICAL

## 2022-01-12 MED ORDER — BUPIVACAINE-EPINEPHRINE (PF) 0.25% -1:200000 IJ SOLN
INTRAMUSCULAR | Status: AC
  Filled 2022-01-12: qty 30

## 2022-01-12 MED ORDER — OXYCODONE HCL 5 MG PO TABS
10.0000 mg | ORAL_TABLET | ORAL | Status: DC | PRN
  Administered 2022-01-12 – 2022-01-13 (×4): 10 mg via ORAL
  Filled 2022-01-12 (×4): qty 2

## 2022-01-12 MED ORDER — METHOCARBAMOL 500 MG PO TABS
500.0000 mg | ORAL_TABLET | Freq: Four times a day (QID) | ORAL | Status: DC | PRN
  Administered 2022-01-12 – 2022-01-13 (×3): 500 mg via ORAL
  Filled 2022-01-12 (×3): qty 1

## 2022-01-12 MED ORDER — SODIUM CHLORIDE 0.9% FLUSH: 3.0000 mL | Freq: Two times a day (BID) | INTRAVENOUS | Status: DC

## 2022-01-12 MED ORDER — ONDANSETRON HCL 4 MG/2ML IJ SOLN: 4.0000 mg | Freq: Once | INTRAMUSCULAR | Status: DC | PRN

## 2022-01-12 MED ORDER — THROMBIN 20000 UNITS EX SOLR
CUTANEOUS | Status: AC
  Filled 2022-01-12: qty 20000

## 2022-01-12 MED ORDER — FENTANYL CITRATE (PF) 100 MCG/2ML IJ SOLN
25.0000 ug | INTRAMUSCULAR | Status: DC | PRN
  Administered 2022-01-12: 50 ug via INTRAVENOUS

## 2022-01-12 MED ORDER — THROMBIN (RECOMBINANT) 20000 UNITS EX SOLR
CUTANEOUS | Status: AC
  Filled 2022-01-12: qty 20000

## 2022-01-12 MED ORDER — ROCURONIUM BROMIDE 10 MG/ML (PF) SYRINGE
PREFILLED_SYRINGE | INTRAVENOUS | Status: DC | PRN
  Administered 2022-01-12: 60 mg via INTRAVENOUS
  Administered 2022-01-12: 20 mg via INTRAVENOUS

## 2022-01-12 MED ORDER — CEFAZOLIN SODIUM-DEXTROSE 2-4 GM/100ML-% IV SOLN
INTRAVENOUS | Status: AC
  Filled 2022-01-12: qty 100

## 2022-01-12 MED ORDER — ONDANSETRON HCL 4 MG/2ML IJ SOLN: 4.0000 mg | Freq: Four times a day (QID) | INTRAMUSCULAR | Status: DC | PRN

## 2022-01-12 MED ORDER — POLYETHYLENE GLYCOL 3350 17 G PO PACK: 17.0000 g | PACK | Freq: Every day | ORAL | Status: DC | PRN

## 2022-01-12 MED ORDER — CHLORHEXIDINE GLUCONATE 0.12 % MT SOLN: 15.0000 mL | Freq: Once | OROMUCOSAL | Status: AC

## 2022-01-12 MED ORDER — 0.9 % SODIUM CHLORIDE (POUR BTL) OPTIME
TOPICAL | Status: DC | PRN
  Administered 2022-01-12 (×2): 1000 mL

## 2022-01-12 MED ORDER — METHOCARBAMOL 500 MG PO TABS: 500.0000 mg | ORAL_TABLET | Freq: Three times a day (TID) | ORAL | 0 refills | Status: AC | PRN

## 2022-01-12 MED ORDER — SUGAMMADEX SODIUM 200 MG/2ML IV SOLN
INTRAVENOUS | Status: DC | PRN
  Administered 2022-01-12: 200 mg via INTRAVENOUS

## 2022-01-12 MED ORDER — BUPIVACAINE LIPOSOME 1.3 % IJ SUSP
INTRAMUSCULAR | Status: AC
  Filled 2022-01-12: qty 20

## 2022-01-12 MED ORDER — METHOCARBAMOL 1000 MG/10ML IJ SOLN: 500.0000 mg | Freq: Four times a day (QID) | INTRAVENOUS | Status: DC | PRN

## 2022-01-12 MED ORDER — ONDANSETRON HCL 4 MG/2ML IJ SOLN
INTRAMUSCULAR | Status: DC | PRN
  Administered 2022-01-12: 4 mg via INTRAVENOUS

## 2022-01-12 MED ORDER — ORAL CARE MOUTH RINSE: 15.0000 mL | Freq: Once | OROMUCOSAL | Status: AC

## 2022-01-12 MED ORDER — LEVOTHYROXINE SODIUM 75 MCG PO TABS
37.5000 ug | ORAL_TABLET | Freq: Every day | ORAL | Status: DC
  Administered 2022-01-13: 37.5 ug via ORAL
  Filled 2022-01-12: qty 1

## 2022-01-12 MED ORDER — ONDANSETRON HCL 4 MG PO TABS: 4.0000 mg | ORAL_TABLET | Freq: Three times a day (TID) | ORAL | 0 refills | Status: DC | PRN

## 2022-01-12 MED ORDER — SODIUM CHLORIDE 0.9 % IV SOLN: INTRAVENOUS | Status: DC

## 2022-01-12 MED ORDER — PROPOFOL 10 MG/ML IV BOLUS
INTRAVENOUS | Status: DC | PRN
  Administered 2022-01-12: 150 mg via INTRAVENOUS

## 2022-01-12 MED ORDER — CEFAZOLIN SODIUM-DEXTROSE 2-4 GM/100ML-% IV SOLN
2.0000 g | INTRAVENOUS | Status: AC
  Administered 2022-01-12 (×2): 2 g via INTRAVENOUS
  Filled 2022-01-12: qty 100

## 2022-01-12 MED ORDER — DULOXETINE HCL 30 MG PO CPEP
30.0000 mg | ORAL_CAPSULE | Freq: Every day | ORAL | Status: DC
  Administered 2022-01-13: 30 mg via ORAL
  Filled 2022-01-12: qty 1

## 2022-01-12 MED ORDER — OXYCODONE-ACETAMINOPHEN 10-325 MG PO TABS: 1.0000 | ORAL_TABLET | Freq: Four times a day (QID) | ORAL | 0 refills | Status: AC | PRN

## 2022-01-12 MED ORDER — FENTANYL CITRATE (PF) 250 MCG/5ML IJ SOLN
INTRAMUSCULAR | Status: AC
  Filled 2022-01-12: qty 5

## 2022-01-12 MED ORDER — THROMBIN 20000 UNITS EX SOLR: CUTANEOUS | Status: DC | PRN

## 2022-01-12 MED ORDER — ACETAMINOPHEN 325 MG PO TABS
650.0000 mg | ORAL_TABLET | ORAL | Status: DC | PRN
  Administered 2022-01-13 (×2): 650 mg via ORAL
  Filled 2022-01-12 (×2): qty 2

## 2022-01-12 MED ORDER — FENTANYL CITRATE (PF) 100 MCG/2ML IJ SOLN
INTRAMUSCULAR | Status: AC
  Filled 2022-01-12: qty 2

## 2022-01-12 MED ORDER — DEXAMETHASONE SODIUM PHOSPHATE 10 MG/ML IJ SOLN
INTRAMUSCULAR | Status: DC | PRN
  Administered 2022-01-12: 5 mg via INTRAVENOUS

## 2022-01-12 MED ORDER — CEFAZOLIN SODIUM-DEXTROSE 1-4 GM/50ML-% IV SOLN
1.0000 g | Freq: Three times a day (TID) | INTRAVENOUS | Status: AC
  Administered 2022-01-12 – 2022-01-13 (×2): 1 g via INTRAVENOUS
  Filled 2022-01-12 (×2): qty 50

## 2022-01-12 MED ORDER — PHENYLEPHRINE HCL-NACL 20-0.9 MG/250ML-% IV SOLN
INTRAVENOUS | Status: DC | PRN
  Administered 2022-01-12: 45 ug/min via INTRAVENOUS

## 2022-01-12 MED ORDER — BUPIVACAINE-EPINEPHRINE 0.25% -1:200000 IJ SOLN
INTRAMUSCULAR | Status: DC | PRN
  Administered 2022-01-12: 10 mL

## 2022-01-12 MED ORDER — KETOROLAC TROMETHAMINE 15 MG/ML IJ SOLN: 15.0000 mg | Freq: Once | INTRAMUSCULAR | Status: DC

## 2022-01-12 MED ORDER — TRANEXAMIC ACID-NACL 1000-0.7 MG/100ML-% IV SOLN
INTRAVENOUS | Status: AC
  Filled 2022-01-12: qty 100

## 2022-01-12 MED ORDER — OXYCODONE HCL 5 MG PO TABS
5.0000 mg | ORAL_TABLET | ORAL | Status: DC | PRN
  Administered 2022-01-13: 5 mg via ORAL
  Filled 2022-01-12: qty 1

## 2022-01-12 SURGICAL SUPPLY — 85 items
ANCHOR SWIFT LOCK ×2 IMPLANT
BAG COUNTER SPONGE SURGICOUNT (BAG) ×2 IMPLANT
BLADE CLIPPER SURG (BLADE) IMPLANT
BLADE SURG 11 STRL SS (BLADE) ×2 IMPLANT
CANISTER SUCT 3000ML PPV (MISCELLANEOUS) ×2 IMPLANT
CHARGER BATTERY NEUROSTIM ABT (NEUROSURGERY SUPPLIES) ×1 IMPLANT
CLSR STERI-STRIP ANTIMIC 1/2X4 (GAUZE/BANDAGES/DRESSINGS) ×2 IMPLANT
CNTNR URN SCR LID CUP LEK RST (MISCELLANEOUS) IMPLANT
CONT SPEC 4OZ STRL OR WHT (MISCELLANEOUS) ×1
CONTROLLER NEUROSTIM PATIENT (NEUROSURGERY SUPPLIES) ×1 IMPLANT
COVER MAYO STAND STRL (DRAPES) ×2 IMPLANT
COVER PROBE W GEL 5X96 (DRAPES) IMPLANT
COVER SURGICAL LIGHT HANDLE (MISCELLANEOUS) ×2 IMPLANT
DRAIN CHANNEL 15F RND FF W/TCR (WOUND CARE) IMPLANT
DRAPE C-ARM 42X72 X-RAY (DRAPES) ×2 IMPLANT
DRAPE C-ARMOR (DRAPES) ×2 IMPLANT
DRAPE INCISE IOBAN 66X45 STRL (DRAPES) ×1 IMPLANT
DRAPE SURG 17X23 STRL (DRAPES) ×2 IMPLANT
DRAPE U-SHAPE 47X51 STRL (DRAPES) ×3 IMPLANT
DRSG OPSITE POSTOP 3X4 (GAUZE/BANDAGES/DRESSINGS) IMPLANT
DRSG OPSITE POSTOP 4X6 (GAUZE/BANDAGES/DRESSINGS) ×2 IMPLANT
DRSG TEGADERM 4X4.75 (GAUZE/BANDAGES/DRESSINGS) ×1 IMPLANT
DURAPREP 26ML APPLICATOR (WOUND CARE) ×2 IMPLANT
ELECT BLADE 4.0 EZ CLEAN MEGAD (MISCELLANEOUS) ×2
ELECT CAUTERY BLADE 6.4 (BLADE) IMPLANT
ELECT PENCIL ROCKER SW 15FT (MISCELLANEOUS) ×2 IMPLANT
ELECT REM PT RETURN 9FT ADLT (ELECTROSURGICAL) ×2
ELECTRODE BLDE 4.0 EZ CLN MEGD (MISCELLANEOUS) ×1 IMPLANT
ELECTRODE REM PT RTRN 9FT ADLT (ELECTROSURGICAL) ×1 IMPLANT
GENERATOR NEUROSTIM ETERNA 16 (Generator) ×1 IMPLANT
GLOVE BIO SURGEON STRL SZ7 (GLOVE) ×2 IMPLANT
GLOVE BIOGEL PI IND STRL 7.0 (GLOVE) ×1 IMPLANT
GLOVE BIOGEL PI IND STRL 8.5 (GLOVE) ×1 IMPLANT
GLOVE BIOGEL PI INDICATOR 7.0 (GLOVE) ×1
GLOVE BIOGEL PI INDICATOR 8.5 (GLOVE) ×1
GLOVE SS BIOGEL STRL SZ 8.5 (GLOVE) ×1 IMPLANT
GLOVE SS N UNI LF 8.5 STRL (GLOVE) ×2 IMPLANT
GLOVE SUPERSENSE BIOGEL SZ 8.5 (GLOVE) ×1
GOWN STRL REUS W/ TWL LRG LVL3 (GOWN DISPOSABLE) ×2 IMPLANT
GOWN STRL REUS W/TWL 2XL LVL3 (GOWN DISPOSABLE) ×2 IMPLANT
GOWN STRL REUS W/TWL LRG LVL3 (GOWN DISPOSABLE) ×2
GUIDE PIN IFUSE DISP 3.2 (PIN) ×6
IMPL IFUSE 3D 7X35 (Rod) IMPLANT
IMPL IFUSE 7.0X45 (Rod) IMPLANT
IMPL IFUSE 7.0X50 (Rod) IMPLANT
IMPLANT IFUSE 3D 7X35 (Rod) ×2 IMPLANT
IMPLANT IFUSE 7.0X45 (Rod) ×2 IMPLANT
IMPLANT IFUSE 7.0X50 (Rod) ×2 IMPLANT
KIT BASIN OR (CUSTOM PROCEDURE TRAY) ×2 IMPLANT
KIT CHARGER NEUROSTIM ABT (NEUROSURGERY SUPPLIES) ×1 IMPLANT
KIT TURNOVER KIT B (KITS) ×2 IMPLANT
LEAD OCTRODE GEN 8CH 60CM (Lead) ×2 IMPLANT
MAGNET NEUROSTIM ETERNA PAT (NEUROSURGERY SUPPLIES) ×1 IMPLANT
MANUAL PATIENT NEUROSTIM DISP (MISCELLANEOUS) ×1 IMPLANT
MARKER SKIN DUAL TIP RULER LAB (MISCELLANEOUS) ×1 IMPLANT
NDL SPNL 18GX3.5 QUINCKE PK (NEEDLE) ×1 IMPLANT
NEEDLE 22X1 1/2 (OR ONLY) (NEEDLE) ×2 IMPLANT
NEEDLE SPNL 18GX3.5 QUINCKE PK (NEEDLE) ×2 IMPLANT
NS IRRIG 1000ML POUR BTL (IV SOLUTION) ×2 IMPLANT
PACK LAMINECTOMY ORTHO (CUSTOM PROCEDURE TRAY) ×2 IMPLANT
PACK UNIVERSAL I (CUSTOM PROCEDURE TRAY) ×2 IMPLANT
PAD ARMBOARD 7.5X6 YLW CONV (MISCELLANEOUS) ×6 IMPLANT
PIN BLUNT IFUSE DISP 3.2 (PIN) ×1 IMPLANT
PIN EXCHANGE IFUSE DISP 3.2 (PIN) ×1 IMPLANT
PIN GUIDE IFUSE DISP 3.2 (PIN) IMPLANT
POSITIONER HEAD PRONE TRACH (MISCELLANEOUS) ×2 IMPLANT
SPATULA SILICONE BRAIN 10MM (MISCELLANEOUS) IMPLANT
SPONGE SURGIFOAM ABS GEL 100 (HEMOSTASIS) ×2 IMPLANT
SPONGE T-LAP 4X18 ~~LOC~~+RFID (SPONGE) ×3 IMPLANT
STAPLER VISISTAT 35W (STAPLE) ×3 IMPLANT
SURGIFLO W/THROMBIN 8M KIT (HEMOSTASIS) ×2 IMPLANT
SUT BONE WAX W31G (SUTURE) ×2 IMPLANT
SUT ETHIBOND 2 OS 4 DA (SUTURE) ×3 IMPLANT
SUT MNCRL AB 3-0 PS2 18 (SUTURE) ×4 IMPLANT
SUT MNCRL+ AB 3-0 CT1 36 (SUTURE) IMPLANT
SUT MONOCRYL AB 3-0 CT1 36IN (SUTURE) ×1
SUT VIC AB 1 CT1 18XCR BRD 8 (SUTURE) ×2 IMPLANT
SUT VIC AB 1 CT1 8-18 (SUTURE) ×2
SUT VIC AB 2-0 CT1 18 (SUTURE) ×4 IMPLANT
SYR BULB IRRIG 60ML STRL (SYRINGE) ×2 IMPLANT
SYR CONTROL 10ML LL (SYRINGE) ×3 IMPLANT
TOWEL GREEN STERILE (TOWEL DISPOSABLE) ×2 IMPLANT
TOWEL GREEN STERILE FF (TOWEL DISPOSABLE) ×2 IMPLANT
WATER STERILE IRR 1000ML POUR (IV SOLUTION) ×2 IMPLANT
YANKAUER SUCT BULB TIP NO VENT (SUCTIONS) ×2 IMPLANT

## 2022-01-12 NOTE — Anesthesia Procedure Notes (Signed)
Procedure Name: Intubation Date/Time: 01/12/2022 1:18 PM  Performed by: Darletta Moll, CRNAPre-anesthesia Checklist: Patient identified, Emergency Drugs available, Suction available and Patient being monitored Patient Re-evaluated:Patient Re-evaluated prior to induction Oxygen Delivery Method: Circle system utilized Preoxygenation: Pre-oxygenation with 100% oxygen Induction Type: IV induction Ventilation: Mask ventilation without difficulty Laryngoscope Size: Mac and 3 Grade View: Grade II Tube type: Oral Tube size: 7.0 mm Number of attempts: 1 Airway Equipment and Method: Stylet Placement Confirmation: ETT inserted through vocal cords under direct vision, positive ETCO2 and breath sounds checked- equal and bilateral Secured at: 21 cm Tube secured with: Tape Dental Injury: Teeth and Oropharynx as per pre-operative assessment

## 2022-01-12 NOTE — Op Note (Signed)
OPERATIVE REPORT  DATE OF SURGERY: 01/12/2022  PATIENT NAME:  Courtney [name] MRN: [id number] DOB: 05-17-1951  PCP: Kelton Pillar, MD  PRE-OPERATIVE DIAGNOSIS: Left sacroiliac dysfunction.  Failed back syndrome status post successful spinal cord stimulator trial  POST-OPERATIVE DIAGNOSIS: Same  PROCEDURE:   1.  Implantation of spinal cord stimulator 2.  Left sacroiliac joint fusion  SURGEON:  Melina Schools, MD  PHYSICIAN ASSISTANT: None  ANESTHESIA:   General  EBL: 937 ml   Complications: None  Implants: Abbott spinal cord stimulator: Rechargeable pulse generator.  Octrode lead X2.  I fuse SI fusion device top: 50 mm length.  Middle: 45 mm length.  Lower: 35 mm length   BRIEF HISTORY: Courtney [name] is a 71 y.o. female who has had a right SI fusion as well as a lumbar spinal fusion.  Patient has had increased pain over the last several years and ultimately had a spinal cord stimulator trial.  This provided excellent improvement in her pain and quality of life.  In addition she was having progressive left sacroiliac joint pain similar to what she had on the contralateral side.  After having successful SI injections she elected to proceed with the SI fusion and the spinal cord stimulator placement.  All appropriate risks, benefits, and alternatives to surgery were discussed with the patient and consent was obtained.  PROCEDURE DETAILS: Patient was brought into the operating room and was properly positioned on the operating room table.  After induction with general anesthesia the patient was endotracheally intubated.  A timeout was taken to confirm all important data: including patient, procedure, and the level. Teds, SCD's were applied.   A Foley was placed by the nurse and the patient was turned prone onto the Wilson frame.  All bony prominences were well-padded and the back pain left gluteal region were prepped and draped in a standard fashion.  Using fluoroscopy to  identify the anatomical borders of the left sacroiliac joint.  A guidepin was placed using lateral fluoroscopy iliac wing.  Once I confirmed satisfactory position I advanced into the iliac wing.  Then using the inlet view I guided it past the SI joint and into the sacrum.  I then used the outlet view to determine the appropriate depth in the sacrum once I had the first guidepin properly position I then proceeded to the second.  Using the targeting device I placed a second guidepin in a similar fashion.  A third guidepin was then placed.  Imaging confirmed satisfactory positioning of all 3 guidepins.  An 11 blade scalpel was then used to create an incision connecting the guidepins.  I then measured and then broached across the top guidepin.  The 50 mm length I fuse implant was then inserted.  The guidepin was then removed and then I placed the middle and lower iFuse devices in a similar fashion.  At this point imaging of the pelvis including inlet, outlet, and lateral demonstrated satisfactory position of the left SI fusion devices.  This wound was then copiously irrigated with normal saline and closed with in a layered fashion with interrupted #1 Vicryl suture, interrupted 2-0 Vicryl suture, and a 3-0 Monocryl for the skin.  I then turned my attention to the spinal cord stimulator.  Using fluoroscopy identified the T12 and T10 pedicles.  I marked out my incision and infiltrated with quarter percent Marcaine with epinephrine.  Midline incision was made and sharp dissection was carried out down to the deep fascia.  The deep fascia  was sharply incised and I stripped the paraspinal muscles to expose the T10, T11 and T12 spinous process and lamina.  Once I had the posterior closure I placed a self-retaining retractor and took an image to confirm the level.  The inferior third of the T10 spinous process was removed with a double-action Leksell rongeur.  Using a 2 mm Kerrison rongeur a laminotomy of T10 was performed.  I  then dissected through the central raphae of the ligamentum flavum and resected the ligamentum flavum to expose the dorsal surface of the thecal sac.  I then obtained the first lead and gently advanced it so that it came to rest at approximately the midportion of the T7.  This was in the midline and I confirmed with the representative that it was properly positioned to obtain similar results as to that of the trial.  A second lead was then placed spanning to the midportion of T8.  This was also in the midline.  The representative confirmed that it was properly positioned and she was pleased with the overall alignment of both leads.  The leads were then directly secured to the spinous process of T10 with an Ethibond suture.  I then dissected through the T11-12 interspinous process ligament to wrapped the leads around the T11 spinous process for added security.  This wound was then copiously irrigated with normal saline and I obtained hemostasis using proper electrocautery and Floseal.  A second incision was made over the right gluteal region and a pocket was created approximately 2 cm deep.  Using the submuscular passer I advanced the leads from the thoracic wound to the gluteal wound.  I then secured the leads into the battery and then secured the battery to the deep fascia.  The excess portion of the leads were wrapped and placed on the undersurface of the battery.  The leads were locked into place and the battery according manufacture standards.  The battery was then secured to the deep fascia with two #1 sutures.  At this point the representative tested the battery and noted that the leads were functioning appropriately.  At this point both wounds were now copiously irrigated with normal saline and then closed in a layered fashion with interrupted #1 Vicryl suture, 2-0 Vicryl suture, and 3-0 Monocryl.  Steri-Strips and dry dressings were applied and the patient was ultimately extubated transferred to PACU  without incident.  The end of the case all needle sponge counts were correct.  There were no adverse intraoperative events.  Melina Schools, MD 01/12/2022 4:29 PM

## 2022-01-12 NOTE — H&P (Signed)
History: [name] presents today for a spinal cord stimulator placement as well as left SI fusion. Patient states their pain level is severe. Pain does interfere with activities of daily living as well as overall quality of life.  As a result we elected to move forward with surgery.     Past Medical History:  Diagnosis Date   ADHD    ADD   Allergic to pets    pet dander   Allergies    Anemia    take iron   Anxiety    Arthritis    hands, hip, shoulders back,   Asthma    as a child   Bicuspid aortic valve    bicuspid AV with mild AS, ascending aorta 3.40 cm 08/31/00 echo   Complication of anesthesia    slow to wake up and lingers for a long time   Concussion    Depression    Family history of adverse reaction to anesthesia    mom had skin reaction to anesthesia but cant remember what it was or exactly what happened   Fatigue    Fibromyalgia    GERD (gastroesophageal reflux disease)    Hyperlipidemia    Hypothyroidism    Irregular periods    Joint pain    Joint stiffness    Lower back pain    Menopause    Migraines    no longer   Mitral regurgitation    Numbness and tingling    Pollen allergy    Presence of pessary    Ringing in ears    Sleep apnea    does not wear CPAP tapes mouth at night   Snoring     Allergies  Allergen Reactions   Fish Allergy Anaphylaxis    Can tolerate shellfish (can eat tuna, salmon, trout, swordfish and redrum). Allergic to fish with scales and fins.   Peanut Butter Flavor Anaphylaxis    Allergic to peanuts and some tree nuts. Can eat cashews, pistachios, and almonds.     No current facility-administered medications on file prior to encounter.   Current Outpatient Medications on File Prior to Encounter  Medication Sig Dispense Refill   acetaminophen (TYLENOL) 500 MG tablet Take 500 mg by mouth in the morning, at noon, and at bedtime.     Artificial Tear Solution (GENTEAL TEARS OP) Place 1 drop into both eyes daily as needed  (dry eyes).     Ascorbic Acid (VITAMIN C) 1000 MG tablet Take 1,000 mg by mouth in the morning and at bedtime.     atorvastatin (LIPITOR) 10 MG tablet Take 10 mg by mouth daily.     Carboxymethylcellul-Glycerin (REFRESH OPTIVE OP) Place 1 drop into both eyes daily as needed (dry eyes).     cholecalciferol (VITAMIN D3) 25 MCG (1000 UNIT) tablet Take 1,000 Units by mouth 2 (two) times daily.     Coenzyme Q10 300 MG CAPS Take 300 mg by mouth daily.      diclofenac Sodium (VOLTAREN) 1 % GEL Apply 2 g topically daily as needed (pain).     docusate sodium (COLACE) 100 MG capsule Take 100 mg by mouth 2 (two) times daily.     DULoxetine (CYMBALTA) 30 MG capsule Take 30 mg by mouth daily after breakfast.      estradiol (ESTRACE) 0.1 MG/GM vaginal cream Place 0.1 g vaginally every 3 (three) days.      famotidine (PEPCID) 20 MG tablet Take 20 mg by mouth at bedtime.  FERROUS BISGLYCINATE CHELATE PO Take 25 mg by mouth daily.      ferrous sulfate 325 (65 FE) MG tablet Take 325 mg by mouth daily.     fluticasone (FLONASE) 50 MCG/ACT nasal spray Place 1 spray into both nostrils at bedtime.      L-Arginine 1000 MG TABS Take 1,000 mg by mouth in the morning.     lactobacillus acidophilus (BACID) TABS tablet Take 1 tablet by mouth daily.     levothyroxine (SYNTHROID, LEVOTHROID) 25 MCG tablet Take 1 1/2 tablets daily (Patient taking differently: Take 37.5 mcg by mouth daily before breakfast. Take 1 1/2 tablets daily) 130 tablet 2   MAGNESIUM CITRATE PO Take 1,000 mg by mouth in the morning.     meloxicam (MOBIC) 15 MG tablet Take 15 mg by mouth daily in the afternoon.     methocarbamol (ROBAXIN) 500 MG tablet Take 1 tablet (500 mg total) by mouth every 8 (eight) hours as needed for muscle spasms. 50 tablet 1   Multiple Vitamins-Minerals (MULTIVITAMIN WITH MINERALS) tablet Take 1 tablet by mouth every other day.      naproxen sodium (ALEVE) 220 MG tablet Take 220 mg by mouth in the morning and at bedtime.      Omega-3 Fatty Acids (FISH OIL ULTRA) 1400 MG CAPS Take 2,800 mg by mouth daily.     oxyCODONE (OXY IR/ROXICODONE) 5 MG immediate release tablet Take 1 tablet (5 mg total) by mouth every 6 (six) hours as needed for severe pain. 15 tablet 0   Oxycodone HCl 10 MG TABS Take 7.5 mg by mouth in the morning, at noon, and at bedtime.     psyllium (REGULOID) 0.52 g capsule Take 0.52 g by mouth in the morning and at bedtime.     Tart Cherry 1200 MG CAPS Take 1,200 mg by mouth daily.     traZODone (DESYREL) 100 MG tablet Take 50 mg by mouth at bedtime.     urea (CARMOL) 40 % CREA Apply 1 application topically 2 (two) times a week.     Vibegron 75 MG TABS Take 75 mg by mouth daily in the afternoon.     vitamin E 400 UNIT capsule Take 400 Units by mouth every 3 (three) days.      Zinc Sulfate (ZINC 15 PO) Take 15 mg by mouth every 3 (three) days.     amoxicillin (AMOXIL) 500 MG tablet Take 2,000 mg by mouth See admin instructions. Take 2000 mg by mouth prior to invasive dental procedures     CALCIUM CITRATE PO Take 3,000 mg by mouth at bedtime.     clobetasol ointment (TEMOVATE) 3.76 % Apply 1 Application topically every 7 (seven) days.     EPINEPHrine (EPIPEN JR) 0.15 MG/0.3ML injection Inject 0.15 mg into the muscle as needed for anaphylaxis.     Lidocaine 4 % PTCH Apply 1 patch topically daily as needed (pain).      loratadine (CLARITIN) 10 MG tablet Take 10 mg by mouth daily as needed (for seasonal allergies (Spring/Summer)).       Physical Exam: Vitals:   01/12/22 1002  BP: 115/60  Resp: 18  Temp: 98.9 F (37.2 C)  SpO2: 95%   Body mass index is 23.55 kg/m.  Clinical exam: [name] is a pleasant individual, who appears younger than their stated age.  She is alert and orientated 3.  Lungs: Clear to auscultation. No shortness of breath, chest pain.  Cardiac: Regular rate and rhythm  Abdomen is soft  and non-tender, negative loss of bowel and bladder control, no rebound  tenderness.  Negative: skin lesions abrasions contusions  Peripheral pulses: 2+ dorsalis pedis/posterior tibialis pulses bilaterally. LE compartments are: Soft and nontender.  Gait pattern: Normal. Patient notes difficulty lifting her leg to step into her car due to right groin and thigh pain/hip flexor weakness  Assistive devices: none  Neuro: 5/5 motor strength in lower extremity bilaterally. Positive dysesthesias and pain with neuropathic right leg symptoms. Negative Babinski test, no clonus, negative straight leg raise test.  Musculoskeletal: Chronic low back pain with limited range of motion. Well-healed surgical scar from previous anterior posterior L3-5 fusion. Positive left FABER test. Positive left Gleason's test. Significant pain with direct palpation of the left SI joint.  Imaging: Degenerative lumbar disc disease L5-S1. Solid L3-5 fusion with instrumentation. Previous right SI fusion with no migration of the iFuse implants. Positive degenerative changes of the left SI joint.  Thoracic MRI was completed on 08/21/2021: No acute fracture or abnormal marrow signal change. Minor degenerative changes throughout the thoracic spine. No severe canal limiting stenosis. No contraindication for placement of the spinal cord stimulator.    A/P: Courtney [name] presents today for preop evaluation for a spinal cord stimulator placement as well as left SI fusion. Patient states their pain level is severe. Pain does interfere with activities of daily living as well as overall quality of life.  Diagnosis: Patient has chronic back pain consistent with a failed back syndrome. She has had a previous L3-5 decompression instrumented fusion and unfortunately has ongoing right hip weakness and pain especially when she is trying to elevate the leg to sit into a car. She also has ongoing severe left sacroiliac joint pain. She is at 2 successful prior injections as well as a successful spinal cord stimulator trial. She  reports 70 to 80% temporary improvement with both the trial and the injections. She has also had multiple courses of physical therapy and continues a self-directed physician lead therapy program.  At this point time her overall quality of life is poor in which she would like to move forward with surgery.  We have gone over the surgical procedures which would be a left SI fusion along with a spinal cord stimulator placement. All of her questions were addressed. Risks and benefits of SI fusion were discussed with the patient. These include: Infection, bleeding, death, stroke, paralysis, ongoing or worse pain, need for additional surgery, nonunion, mal-position of the screws. Also risk of nerve damage, injury to the bowel and bladder, and deep venous thrombosis (blood clots).  Risks and benefits of spinal cord stimulator placement were discussed with the patient. These include: Infection, bleeding, death, stroke, paralysis, ongoing or worse pain, need for additional surgery, leak of spinal fluid, Failure of the battery requiring reoperation. Inability to place the paddle requiring the surgery to be aborted. Migration of the lead, failure to obtain results similar to the trial.

## 2022-01-12 NOTE — Transfer of Care (Signed)
Immediate Anesthesia Transfer of Care Note  Patient: Courtney [name]  Procedure(s) Performed: SPINAL CORD STIMULATOR PLACEMENT, LEFT SACROILIAC FUSION (Left) LUMBAR SPINAL CORD STIMULATOR INSERTION (Left)  Patient Location: PACU  Anesthesia Type:General  Level of Consciousness: awake, alert  and oriented  Airway & Oxygen Therapy: Patient Spontanous Breathing  Post-op Assessment: Report given to RN and Post -op Vital signs reviewed and stable  Post vital signs: Reviewed and stable  Last Vitals:  Vitals Value Taken Time  BP 109/43 01/12/22 1639  Temp    Pulse 79 01/12/22 1642  Resp 8 01/12/22 1642  SpO2 97 % 01/12/22 1642  Vitals shown include unvalidated device data.  Last Pain:  Vitals:   01/12/22 1023  TempSrc:   PainSc: 2          Complications: No notable events documented.

## 2022-01-12 NOTE — Discharge Instructions (Signed)
[facility], Care After This sheet gives you information about how to care for yourself after your procedure. Your health care provider may also give you more specific instructions. If you have problems or questions, contact your health care provider. What can I expect after the procedure? After the procedure, it is common to have: Some pain around your incision area. Muscle tightening (spasms) across the back.   Follow these instructions at home: Incision care Follow instructions from your health care provider about how to take care of your incision area. Make sure you: Wash your hands with soap and water before and after you apply medicine to the area or change your bandage (dressing). If soap and water are not available, use hand sanitizer. Change your dressing as told by your health care provider. Leave stitches (sutures), skin glue, or adhesive strips in place. These skin closures may need to stay in place for 2 weeks or longer. If adhesive strip edges start to loosen and curl up, you may trim the loose edges. Do not remove adhesive strips completely unless your health care provider tells you to do that.  Check your incision area every day for signs of infection. Check for: More redness, swelling, or pain. More fluid or blood. Warmth. Pus or a bad smell. REMEMBER: CRUTCHES - DO NOT BEAR WEIGHT ON EFFECTED SIDE Medicines Take over-the-counter and prescription medicines only as told by your health care provider. If you were prescribed an antibiotic medicine, use it as told by your health care provider. Do not stop using the antibiotic even if you start to feel better. Pain medications have been electronically prescribed. Bathing Do not take baths, swim, or use a hot tub for 6 weeks, or until your incision has healed completely. If your health care provider approves, you may take showers after your dressing has been removed. Ok to shower in five (5) days Activity Return to your normal  activities as told by your health care provider. Ask your health care provider what activities are safe for you. Avoid bending or twisting at your waist. Always bend at your knees. Do not sit for more than 20-30 minutes at a time. Lie down or walk between periods of sitting. Do not lift anything that is heavier than 10 lb (4.5 kg) or the limit that your health care provider tells you, until he or she says that it is safe. Do not drive for 2 weeks after your procedure or for as long as your health care provider tells you.  Do not drive or use heavy machinery while taking prescription pain medicine. DO NOT BEAR WEIGHT ON THE LEFT LEG General instructions To prevent or treat constipation while you are taking prescription pain medicine, your health care provider may recommend that you: Drink enough fluid to keep your urine clear or pale yellow. Take over-the-counter or prescription medicines. Eat foods that are high in fiber, such as fresh fruits and vegetables, whole grains, and beans. Limit foods that are high in fat and processed sugars, such as fried and sweet foods. Do breathing exercises as told. Keep all follow-up visits as told by your health care provider. This is important. Contact a health care provider if: You have more redness, swelling, or pain around your incision area. Your incision feels warm to the touch. You are not able to return to activities or do exercises as told by your health care provider. Get help right away if: You have: More fluid or blood coming from your incision area. Pus  or a bad smell coming from your incision area. Chills or a fever. Episodes of dizziness or fainting while standing. You develop a rash. You develop shortness of breath or you have difficulty breathing. You cannot control when you urinate or have a bowel movement. You become weak. You are not able to use your legs. Summary After the procedure, it is common to have some pain around your  incision area. You may also have muscle tightening (spasms) across the back. Follow instructions from your health care provider about how to care for your incision. Do not lift anything that is heavier than 10 lb (4.5 kg) or the limit that your health care provider tells you, until he or she says that it is safe. Contact your health care provider if you have more redness, swelling, or pain around your incision area or if your incision feels warm to the touch. These can be signs of infection. This information is not intended to replace advice given to you by your health care provider. Make sure you discuss any questions you have with your health care provider. [facility], Care After This sheet gives you information about how to care for yourself after your procedure. Your health care provider may also give you more specific instructions. If you have problems or questions, contact your health care provider. What can I expect after the procedure? After the procedure, it is common to have: Some pain around your incision area. Muscle tightening (spasms) across the back.   Follow these instructions at home: Incision care Follow instructions from your health care provider about how to take care of your incision area. Make sure you: Wash your hands with soap and water before and after you apply medicine to the area or change your bandage (dressing). If soap and water are not available, use hand sanitizer. Change your dressing as told by your health care provider. Leave stitches (sutures), skin glue, or adhesive strips in place. These skin closures may need to stay in place for 2 weeks or longer. If adhesive strip edges start to loosen and curl up, you may trim the loose edges. Do not remove adhesive strips completely unless your health care provider tells you to do that.  Check your incision area every day for signs of infection. Check for: More redness, swelling, or pain. More fluid or  blood. Warmth. Pus or a bad smell. REMEMBER: CRUTCHES - DO NOT BEAR WEIGHT ON EFFECTED SIDE Medicines Take over-the-counter and prescription medicines only as told by your health care provider. If you were prescribed an antibiotic medicine, use it as told by your health care provider. Do not stop using the antibiotic even if you start to feel better. Pain medications have been electronically prescribed. Bathing Do not take baths, swim, or use a hot tub for 6 weeks, or until your incision has healed completely. If your health care provider approves, you may take showers after your dressing has been removed. Ok to shower in five (5) days Activity Return to your normal activities as told by your health care provider. Ask your health care provider what activities are safe for you. Avoid bending or twisting at your waist. Always bend at your knees. Do not sit for more than 20-30 minutes at a time. Lie down or walk between periods of sitting. Do not lift anything that is heavier than 10 lb (4.5 kg) or the limit that your health care provider tells you, until he or she says that it is safe. Do not  drive for 2 weeks after your procedure or for as long as your health care provider tells you.  Do not drive or use heavy machinery while taking prescription pain medicine. DO NOT BEAR WEIGHT ON THE LEFT LEG General instructions To prevent or treat constipation while you are taking prescription pain medicine, your health care provider may recommend that you: Drink enough fluid to keep your urine clear or pale yellow. Take over-the-counter or prescription medicines. Eat foods that are high in fiber, such as fresh fruits and vegetables, whole grains, and beans. Limit foods that are high in fat and processed sugars, such as fried and sweet foods. Do breathing exercises as told. Keep all follow-up visits as told by your health care provider. This is important. Contact a health care provider if: You have  more redness, swelling, or pain around your incision area. Your incision feels warm to the touch. You are not able to return to activities or do exercises as told by your health care provider. Get help right away if: You have: More fluid or blood coming from your incision area. Pus or a bad smell coming from your incision area. Chills or a fever. Episodes of dizziness or fainting while standing. You develop a rash. You develop shortness of breath or you have difficulty breathing. You cannot control when you urinate or have a bowel movement. You become weak. You are not able to use your legs. Summary After the procedure, it is common to have some pain around your incision area. You may also have muscle tightening (spasms) across the back. Follow instructions from your health care provider about how to care for your incision. Do not lift anything that is heavier than 10 lb (4.5 kg) or the limit that your health care provider tells you, until he or she says that it is safe. Contact your health care provider if you have more redness, swelling, or pain around your incision area or if your incision feels warm to the touch. These can be signs of infection. This information is not intended to replace advice given to you by your health care provider. Make sure you discuss any questions you have with your health care provider.[facility], Care After This sheet gives you information about how to care for yourself after your procedure. Your health care provider may also give you more specific instructions. If you have problems or questions, contact your health care provider. What can I expect after the procedure? After the procedure, it is common to have: Some pain around your incision area. Muscle tightening (spasms) across the back.   Follow these instructions at home: Incision care Follow instructions from your health care provider about how to take care of your incision area. Make sure you: Wash  your hands with soap and water before and after you apply medicine to the area or change your bandage (dressing). If soap and water are not available, use hand sanitizer. Change your dressing as told by your health care provider. Leave stitches (sutures), skin glue, or adhesive strips in place. These skin closures may need to stay in place for 2 weeks or longer. If adhesive strip edges start to loosen and curl up, you may trim the loose edges. Do not remove adhesive strips completely unless your health care provider tells you to do that.  Check your incision area every day for signs of infection. Check for: More redness, swelling, or pain. More fluid or blood. Warmth. Pus or a bad smell. REMEMBER: CRUTCHES - DO  NOT BEAR WEIGHT ON EFFECTED SIDE Medicines Take over-the-counter and prescription medicines only as told by your health care provider. If you were prescribed an antibiotic medicine, use it as told by your health care provider. Do not stop using the antibiotic even if you start to feel better. Pain medications have been electronically prescribed. Bathing Do not take baths, swim, or use a hot tub for 6 weeks, or until your incision has healed completely. If your health care provider approves, you may take showers after your dressing has been removed. Ok to shower in five (5) days Activity Return to your normal activities as told by your health care provider. Ask your health care provider what activities are safe for you. Avoid bending or twisting at your waist. Always bend at your knees. Do not sit for more than 20-30 minutes at a time. Lie down or walk between periods of sitting. Do not lift anything that is heavier than 10 lb (4.5 kg) or the limit that your health care provider tells you, until he or she says that it is safe. Do not drive for 2 weeks after your procedure or for as long as your health care provider tells you.  Do not drive or use heavy machinery while taking  prescription pain medicine. DO NOT BEAR WEIGHT ON THE LEFT LEG General instructions To prevent or treat constipation while you are taking prescription pain medicine, your health care provider may recommend that you: Drink enough fluid to keep your urine clear or pale yellow. Take over-the-counter or prescription medicines. Eat foods that are high in fiber, such as fresh fruits and vegetables, whole grains, and beans. Limit foods that are high in fat and processed sugars, such as fried and sweet foods. Do breathing exercises as told. Keep all follow-up visits as told by your health care provider. This is important. Contact a health care provider if: You have more redness, swelling, or pain around your incision area. Your incision feels warm to the touch. You are not able to return to activities or do exercises as told by your health care provider. Get help right away if: You have: More fluid or blood coming from your incision area. Pus or a bad smell coming from your incision area. Chills or a fever. Episodes of dizziness or fainting while standing. You develop a rash. You develop shortness of breath or you have difficulty breathing. You cannot control when you urinate or have a bowel movement. You become weak. You are not able to use your legs. Summary After the procedure, it is common to have some pain around your incision area. You may also have muscle tightening (spasms) across the back. Follow instructions from your health care provider about how to care for your incision. Do not lift anything that is heavier than 10 lb (4.5 kg) or the limit that your health care provider tells you, until he or she says that it is safe. Contact your health care provider if you have more redness, swelling, or pain around your incision area or if your incision feels warm to the touch. These can be signs of infection. This information is not intended to replace advice given to you by your health care  provider. Make sure you discuss any questions you have with your health care provider.[facility], Care After This sheet gives you information about how to care for yourself after your procedure. Your health care provider may also give you more specific instructions. If you have problems or questions, contact your  health care provider. What can I expect after the procedure? After the procedure, it is common to have: Some pain around your incision area. Muscle tightening (spasms) across the back.   Follow these instructions at home: Incision care Follow instructions from your health care provider about how to take care of your incision area. Make sure you: Wash your hands with soap and water before and after you apply medicine to the area or change your bandage (dressing). If soap and water are not available, use hand sanitizer. Change your dressing as told by your health care provider. Leave stitches (sutures), skin glue, or adhesive strips in place. These skin closures may need to stay in place for 2 weeks or longer. If adhesive strip edges start to loosen and curl up, you may trim the loose edges. Do not remove adhesive strips completely unless your health care provider tells you to do that.  Check your incision area every day for signs of infection. Check for: More redness, swelling, or pain. More fluid or blood. Warmth. Pus or a bad smell. REMEMBER: CRUTCHES - DO NOT BEAR WEIGHT ON EFFECTED SIDE Medicines Take over-the-counter and prescription medicines only as told by your health care provider. If you were prescribed an antibiotic medicine, use it as told by your health care provider. Do not stop using the antibiotic even if you start to feel better. Pain medications have been electronically prescribed. Bathing Do not take baths, swim, or use a hot tub for 6 weeks, or until your incision has healed completely. If your health care provider approves, you may take showers after your  dressing has been removed. Ok to shower in five (5) days Activity Return to your normal activities as told by your health care provider. Ask your health care provider what activities are safe for you. Avoid bending or twisting at your waist. Always bend at your knees. Do not sit for more than 20-30 minutes at a time. Lie down or walk between periods of sitting. Do not lift anything that is heavier than 10 lb (4.5 kg) or the limit that your health care provider tells you, until he or she says that it is safe. Do not drive for 2 weeks after your procedure or for as long as your health care provider tells you.  Do not drive or use heavy machinery while taking prescription pain medicine. DO NOT BEAR WEIGHT ON THE LEFT LEG General instructions To prevent or treat constipation while you are taking prescription pain medicine, your health care provider may recommend that you: Drink enough fluid to keep your urine clear or pale yellow. Take over-the-counter or prescription medicines. Eat foods that are high in fiber, such as fresh fruits and vegetables, whole grains, and beans. Limit foods that are high in fat and processed sugars, such as fried and sweet foods. Do breathing exercises as told. Keep all follow-up visits as told by your health care provider. This is important. Contact a health care provider if: You have more redness, swelling, or pain around your incision area. Your incision feels warm to the touch. You are not able to return to activities or do exercises as told by your health care provider. Get help right away if: You have: More fluid or blood coming from your incision area. Pus or a bad smell coming from your incision area. Chills or a fever. Episodes of dizziness or fainting while standing. You develop a rash. You develop shortness of breath or you have difficulty breathing. You cannot control  when you urinate or have a bowel movement. You become weak. You are not able to  use your legs. Summary After the procedure, it is common to have some pain around your incision area. You may also have muscle tightening (spasms) across the back. Follow instructions from your health care provider about how to care for your incision. Do not lift anything that is heavier than 10 lb (4.5 kg) or the limit that your health care provider tells you, until he or she says that it is safe. Contact your health care provider if you have more redness, swelling, or pain around your incision area or if your incision feels warm to the touch. These can be signs of infection. This information is not intended to replace advice given to you by your health care provider. Make sure you discuss any questions you have with your health care provider.      Spinal Cord Stimulator Care After This sheet gives you information about how to care for yourself after your procedure. Your health care provider may also give you more specific instructions. If you have problems or questions, contact your health care provider. What can I expect after the procedure? After the procedure, it is common to have: Soreness or pain. Some swelling in the area where the hardware was removed. A small amount of blood or clear fluid coming from your incision. Follow these instructions at home: If you have a cast: Do not stick anything inside the cast to scratch your skin. Doing that increases your risk of infection. Check the skin around the cast every day. Tell your health care provider about any concerns. You may put lotion on dry skin around the edges of the cast. Do not put lotion on the skin underneath the cast. Keep the cast clean and dry. Do not take baths, swim, or use a hot tub until your health care provider approves. Ask your health care provider if you may take showers. You may only be allowed to take sponge baths. Keep the bandage (dressing) dry until your health care provider says it can be removed. Ok to  shower in 5 days.    Incision care  Follow instructions from your health care provider about how to take care of your incision. Make sure you: Wash your hands with soap and water before you change your dressing. If soap and water are not available, use hand sanitizer. Change your dressing as told by your health care provider. Leave stitches (sutures), skin glue, or adhesive strips in place. These skin closures may need to stay in place for 2 weeks or longer. If adhesive strip edges start to loosen and curl up, you may trim the loose edges. Do not remove adhesive strips completely unless your health care provider tells you to do that. Check your incision area every day for signs of infection. Check for: Redness. More swelling or pain. More fluid or blood. Warmth. Pus or a bad smell. Managing pain, stiffness, and swelling  If directed, put ice on the affected area: Put ice in a plastic bag. Place a towel between your skin and the bag. Leave the ice on for 20 minutes, 2-3 times a day. Move your fingers or toes often to avoid stiffness and to lessen swelling.  Driving Do not drive or use heavy machinery while taking prescription pain medicine. Do not drive for 24 hours if you were given a medicine to help you relax (sedative) during your procedure. Ask your health care provider when it  is safe to drive if you have a cast, splint, or boot on the affected limb. Activity Ask your health care provider what activities are safe for you during recovery, and ask what activities you need to avoid. Do not use the injured limb to support your body weight until your health care provider says that you can. Do not play contact sports until your health care provider approves. Do exercises as told by your health care provider. Avoid sitting for a long time without moving. Get up and move around at least every few hours. This will help prevent blood clots. General instructions Do not put pressure on any  part of the cast or splint until it is fully hardened. This may take several hours. If you are taking prescription pain medicine, take actions to prevent or treat constipation. Your health care provider may recommend that you: Drink enough fluid to keep your urine pale yellow. Eat foods that are high in fiber, such as fresh fruits and vegetables, whole grains, and beans. Limit foods that are high in fat and processed sugars, such as fried or sweet foods. Take an over-the-counter or prescription medicine for constipation. Do not use any products that contain nicotine or tobacco, such as cigarettes and e-cigarettes. These can delay bone healing after surgery. If you need help quitting, ask your health care provider. Take over-the-counter and prescription medicines only as told by your health care provider. Keep all follow-up visits as told by your health care provider. This is important. Contact a health care provider if: You have lasting pain. You have redness around your incision. You have more swelling or pain around your incision. You have more fluid or blood coming from your incision. Your incision feels warm to the touch. You have pus or a bad smell coming from your incision. You are unable to do exercises or physical activity as told by your health care provider. Get help right away if: You have difficulty breathing. You have chest pain. You have severe pain. You have a fever or chills. You have numbness for more than 24 hours in the area where the hardware was removed. Summary After the procedure, it is common to have some pain and swelling in the area where the hardware was removed. Follow instructions from your health care provider about how to take care of your incision. Return to your normal activities as told by your health care provider. Ask your health care provider what activities are safe for you. This information is not intended to replace advice given to you by your health  care provider. Make sure you discuss any questions you have with your health care provider.

## 2022-01-12 NOTE — Brief Op Note (Signed)
01/12/2022  4:43 PM  PATIENT:  Courtney [name]  71 y.o. female  PRE-OPERATIVE DIAGNOSIS:  Left sacroiliac joint dysfunction, chronic back pain  POST-OPERATIVE DIAGNOSIS:  Left sacroiliac joint dysfunction, chronic back pain  PROCEDURE:  Procedure(s): SPINAL CORD STIMULATOR PLACEMENT, LEFT SACROILIAC FUSION (Left) LUMBAR SPINAL CORD STIMULATOR INSERTION (Left)  SURGEON:  Surgeon(s) and Role:    Melina Schools, MD - Primary  PHYSICIAN ASSISTANT:   ASSISTANTS: none   ANESTHESIA:   general  EBL:  100 mL   BLOOD ADMINISTERED:none  DRAINS: none   LOCAL MEDICATIONS USED:  MARCAINE     SPECIMEN:  No Specimen  DISPOSITION OF SPECIMEN:  N/A  COUNTS:  YES  TOURNIQUET:  * No tourniquets in log *  DICTATION: .Dragon Dictation  PLAN OF CARE: Admit to inpatient   PATIENT DISPOSITION:  PACU - hemodynamically stable.

## 2022-01-12 NOTE — Anesthesia Postprocedure Evaluation (Signed)
Anesthesia Post Note  Patient: Courtney [name]  Procedure(s) Performed: SPINAL CORD STIMULATOR PLACEMENT, LEFT SACROILIAC FUSION (Left) LUMBAR SPINAL CORD STIMULATOR INSERTION (Left)     Patient location during evaluation: PACU Anesthesia Type: General Level of consciousness: awake Pain management: pain level controlled Vital Signs Assessment: post-procedure vital signs reviewed and stable Respiratory status: spontaneous breathing Cardiovascular status: stable Postop Assessment: no apparent nausea or vomiting Anesthetic complications: no   No notable events documented.  Last Vitals:  Vitals:   01/12/22 1812 01/12/22 1836  BP: (!) 127/45 133/69  Pulse: 69 75  Resp: 12 20  Temp:  36.7 C  SpO2: 99% 97%    Last Pain:  Vitals:   01/12/22 1953  TempSrc:   PainSc: New Site Jr

## 2022-01-13 DIAGNOSIS — Z79899 Other long term (current) drug therapy: Secondary | ICD-10-CM | POA: Diagnosis not present

## 2022-01-13 DIAGNOSIS — M461 Sacroiliitis, not elsewhere classified: Secondary | ICD-10-CM | POA: Diagnosis not present

## 2022-01-13 DIAGNOSIS — G8929 Other chronic pain: Secondary | ICD-10-CM | POA: Diagnosis not present

## 2022-01-13 DIAGNOSIS — M545 Low back pain, unspecified: Secondary | ICD-10-CM | POA: Diagnosis not present

## 2022-01-13 DIAGNOSIS — J45909 Unspecified asthma, uncomplicated: Secondary | ICD-10-CM | POA: Diagnosis not present

## 2022-01-13 DIAGNOSIS — E039 Hypothyroidism, unspecified: Secondary | ICD-10-CM | POA: Diagnosis not present

## 2022-01-13 NOTE — Discharge Summary (Signed)
Patient ID: Courtney [name] MRN: [id number] DOB/AGE: [birth year] 71 y.o.  Admit date: 01/12/2022 Discharge date: 01/13/2022  Admission Diagnoses:  Principal Problem:   Chronic pain   Discharge Diagnoses:  Principal Problem:   Chronic pain  status post Procedure(s): SPINAL CORD STIMULATOR PLACEMENT, LEFT SACROILIAC FUSION LUMBAR SPINAL CORD STIMULATOR INSERTION  Past Medical History:  Diagnosis Date   ADHD    ADD   Allergic to pets    pet dander   Allergies    Anemia    take iron   Anxiety    Arthritis    hands, hip, shoulders back,   Asthma    as a child   Bicuspid aortic valve    bicuspid AV with mild AS, ascending aorta 3.40 cm 1/49/70 echo   Complication of anesthesia    slow to wake up and lingers for a long time   Concussion    Depression    Family history of adverse reaction to anesthesia    mom had skin reaction to anesthesia but cant remember what it was or exactly what happened   Fatigue    Fibromyalgia    GERD (gastroesophageal reflux disease)    Hyperlipidemia    Hypothyroidism    Irregular periods    Joint pain    Joint stiffness    Lower back pain    Menopause    Migraines    no longer   Mitral regurgitation    Numbness and tingling    Pollen allergy    Presence of pessary    Ringing in ears    Sleep apnea    does not wear CPAP tapes mouth at night   Snoring     Surgeries: Procedure(s): SPINAL CORD STIMULATOR PLACEMENT, LEFT [place] on 01/12/2022   Consultants:   Discharged Condition: Improved  Hospital Course: Courtney [name] is an 71 y.o. female who was admitted 01/12/2022 for operative treatment of Chronic pain. Patient failed conservative treatments (please see the history and physical for the specifics) and had severe unremitting pain that affects sleep, daily activities and work/hobbies. After pre-op clearance, the patient was taken to the operating room on 01/12/2022 and underwent   Procedure(s): [place], LEFT SACROILIAC FUSION LUMBAR SPINAL CORD STIMULATOR INSERTION.    Patient was given perioperative antibiotics:  Anti-infectives (From admission, onward)    Start     Dose/Rate Route Frequency Ordered Stop   01/12/22 1730  ceFAZolin (ANCEF) IVPB 1 g/50 mL premix        1 g 100 mL/hr over 30 Minutes Intravenous Every 8 hours 01/12/22 1722 01/13/22 0216   01/12/22 1130  ceFAZolin (ANCEF) IVPB 2g/100 mL premix        2 g 200 mL/hr over 30 Minutes Intravenous 30 min pre-op 01/12/22 1001 01/12/22 1410   01/12/22 1004  ceFAZolin (ANCEF) 2-4 GM/100ML-% IVPB       Note to Pharmacy: Wannetta Sender C: cabinet override      01/12/22 1004 01/12/22 1321        Patient was given sequential compression devices and early ambulation to prevent DVT.   Patient benefited maximally from hospital stay and there were no complications. At the time of discharge, the patient was urinating/moving their bowels without difficulty, tolerating a regular diet, pain is controlled with oral pain medications and they have been cleared by PT/OT.   Recent vital signs: Patient Vitals for the past 24 hrs:  BP Temp Temp src  Pulse Resp SpO2 Height Weight  01/13/22 0444 (!) 109/51 99.1 F (37.3 C) Oral 74 18 98 % -- --  01/12/22 2325 (!) 105/47 99.3 F (37.4 C) Oral 78 18 99 % -- --  01/12/22 1836 133/69 98 F (36.7 C) -- 75 20 97 % -- --  01/12/22 1812 (!) 127/45 -- -- 69 12 99 % -- --  01/12/22 1800 111/61 -- -- 63 16 97 % -- --  01/12/22 1753 90/69 -- -- 66 15 100 % -- --  01/12/22 1745 (!) 100/47 97.9 F (36.6 C) -- 77 17 94 % -- --  01/12/22 1741 (!) 100/58 -- -- 74 14 92 % -- --  01/12/22 1737 (!) 59/36 -- -- 74 16 92 % -- --  01/12/22 1735 (!) 61/42 -- -- 67 12 94 % -- --  01/12/22 1730 (!) 100/59 97.9 F (36.6 C) -- 71 12 92 % -- --  01/12/22 1715 (!) 109/53 -- -- 75 16 94 % -- --  01/12/22 1700 (!) 108/53 -- -- 67 10 96 % -- --  01/12/22 1645 (!) 100/46 --  -- 75 14 97 % -- --  01/12/22 1639 (!) 109/43 98.8 F (37.1 C) -- 89 13 98 % -- --  01/12/22 1002 115/60 98.9 F (37.2 C) Oral -- 18 95 % '5\' 4"'$  (1.626 m) 62.2 kg     Recent laboratory studies: No results for input(s): "WBC", "HGB", "HCT", "PLT", "NA", "K", "CL", "CO2", "BUN", "CREATININE", "GLUCOSE", "INR", "CALCIUM" in the last 72 hours.  Invalid input(s): "PT", "2"   Discharge Medications:   Allergies as of 01/13/2022       Reactions   Fish Allergy Anaphylaxis   Can tolerate shellfish (can eat tuna, salmon, trout, swordfish and redrum). Allergic to fish with scales and fins.   Peanut Butter Flavor Anaphylaxis   Allergic to peanuts and some tree nuts. Can eat cashews, pistachios, and almonds.         Medication List     STOP taking these medications    acetaminophen 500 MG tablet Commonly known as: TYLENOL   amoxicillin 500 MG tablet Commonly known as: AMOXIL   clobetasol ointment 0.05 % Commonly known as: TEMOVATE   diclofenac Sodium 1 % Gel Commonly known as: VOLTAREN   estradiol 0.1 MG/GM vaginal cream Commonly known as: ESTRACE   Fish Oil Ultra 1400 MG Caps   L-Arginine 1000 MG Tabs   lactobacillus acidophilus Tabs tablet   lidocaine 4 %   MAGNESIUM CITRATE PO   meloxicam 15 MG tablet Commonly known as: MOBIC   multivitamin with minerals tablet   naproxen sodium 220 MG tablet Commonly known as: ALEVE   oxyCODONE 5 MG immediate release tablet Commonly known as: Oxy IR/ROXICODONE   Oxycodone HCl 10 MG Tabs   urea 40 % Crea Commonly known as: CARMOL   Vibegron 75 MG Tabs   vitamin E 180 MG (400 UNITS) capsule   ZINC 15 PO       TAKE these medications    atorvastatin 10 MG tablet Commonly known as: LIPITOR Take 10 mg by mouth daily.   CALCIUM CITRATE PO Take 3,000 mg by mouth at bedtime.   cholecalciferol 25 MCG (1000 UNIT) tablet Commonly known as: VITAMIN D3 Take 1,000 Units by mouth 2 (two) times daily.   Coenzyme Q10 300  MG Caps Take 300 mg by mouth daily.   docusate sodium 100 MG capsule Commonly known as: COLACE Take 100 mg by mouth  2 (two) times daily.   DULoxetine 30 MG capsule Commonly known as: CYMBALTA Take 30 mg by mouth daily after breakfast.   EPINEPHrine 0.15 MG/0.3ML injection Commonly known as: EPIPEN JR Inject 0.15 mg into the muscle as needed for anaphylaxis.   famotidine 20 MG tablet Commonly known as: PEPCID Take 20 mg by mouth at bedtime.   FERROUS BISGLYCINATE CHELATE PO Take 25 mg by mouth daily.   ferrous sulfate 325 (65 FE) MG tablet Take 325 mg by mouth daily.   fluticasone 50 MCG/ACT nasal spray Commonly known as: FLONASE Place 1 spray into both nostrils at bedtime.   GENTEAL TEARS OP Place 1 drop into both eyes daily as needed (dry eyes).   levothyroxine 25 MCG tablet Commonly known as: SYNTHROID Take 1 1/2 tablets daily What changed:  how much to take how to take this when to take this   loratadine 10 MG tablet Commonly known as: CLARITIN Take 10 mg by mouth daily as needed (for seasonal allergies (Spring/Summer)).   methocarbamol 500 MG tablet Commonly known as: ROBAXIN Take 1 tablet (500 mg total) by mouth every 8 (eight) hours as needed for up to 5 days for muscle spasms.   ondansetron 4 MG tablet Commonly known as: Zofran Take 1 tablet (4 mg total) by mouth every 8 (eight) hours as needed for nausea or vomiting.   oxyCODONE-acetaminophen 10-325 MG tablet Commonly known as: Percocet Take 1 tablet by mouth every 6 (six) hours as needed for up to 5 days for pain.   psyllium 0.52 g capsule Commonly known as: REGULOID Take 0.52 g by mouth in the morning and at bedtime.   REFRESH OPTIVE OP Place 1 drop into both eyes daily as needed (dry eyes).   Tart Cherry 1200 MG Caps Take 1,200 mg by mouth daily.   traZODone 100 MG tablet Commonly known as: DESYREL Take 50 mg by mouth at bedtime.   vitamin C 1000 MG tablet Take 1,000 mg by mouth in the  morning and at bedtime.        Diagnostic Studies: DG THORACOLUMABAR SPINE  Result Date: 01/12/2022 CLINICAL DATA:  Spinal cord stimulator placement. EXAM: THORACOLUMBAR SPINE 1V Radiation exposure index: 68.2 mGy. COMPARISON:  July 29, 2020. FINDINGS: Three intraoperative fluoroscopic images were obtained of the lower thoracic spine. These demonstrate the distal tip of the stimulator lead to be in the midthoracic level. IMPRESSION: Fluoroscopic guidance provided during thoracic stimulator lead placement. Electronically Signed   By: Marijo Conception M.D.   On: 01/12/2022 16:09   DG Si Joints  Result Date: 01/12/2022 CLINICAL DATA:  SPINAL CORD STIMULATOR PLACEMENT, LEFT SACROILIAC FUSION EXAM: BILATERAL SACROILIAC JOINTS - 3+ VIEW COMPARISON:  None Available. FINDINGS: Reported fluoro dose: 68.20 mGy. Fluoro time: 2 minutes 52 seconds. Intraoperative fluoroscopy. Three C-arm fluoroscopic images were obtained intraoperatively and submitted for post operative interpretation. Partially imaged postsurgical changes of bilateral sacral fusion. Also, partially imaged lumbar fusion hardware. Please see the performing provider's procedural report for further detail. IMPRESSION: Intraoperative fluoroscopy, as detailed above. Electronically Signed   By: Margaretha Sheffield M.D.   On: 01/12/2022 16:08   DG C-Arm 1-60 Min-No Report  Result Date: 01/12/2022 Fluoroscopy was utilized by the requesting physician.  No radiographic interpretation.   DG C-Arm 1-60 Min-No Report  Result Date: 01/12/2022 Fluoroscopy was utilized by the requesting physician.  No radiographic interpretation.   DG C-Arm 1-60 Min-No Report  Result Date: 01/12/2022 Fluoroscopy was utilized by the requesting physician.  No radiographic interpretation.    Discharge Instructions     Incentive spirometry RT   Complete by: As directed         Follow-up Information     Melina Schools, MD Follow up in 2 week(s).   Specialty:  Orthopedic Surgery Why: If symptoms worsen, For suture removal, For wound re-check Contact information: [address] [place] [address] [phone]                 Discharge Plan:  discharge to home  Disposition: Tashima is doing quite well status post spinal cord stimulator placement and left SI fusion.  She is ambulated twice and her pain is well controlled with oral medications.  Patient will be discharged to home today with appropriate medications and instructions.  She will follow-up with me in 2 weeks for wound check and reevaluation.  All of her questions were encouraged and addressed.    Signed: Dahlia Bailiff for Dr. Melina Schools [facility] [phone] 01/13/2022, 7:08 AM

## 2022-01-13 NOTE — Evaluation (Signed)
Physical Therapy Evaluation  Patient Details Name: Courtney [name] MRN: [id number] DOB: 10-05-1950 Today's Date: 01/13/2022  History of Present Illness  Pt is a 71 y.o. female s/p Lumbar spinal cord stimulator placement, L sacroiliac fusion on 01/12/2022. PMH significant for ADHD, anemia, bicuspid aotic valve, mitral regurgitation, fibromyalgia.   Clinical Impression  Pt admitted with above diagnosis. At the time of PT eval, pt was able to demonstrate transfers and ambulation with gross min guard assist and RW for support. Pt required cues throughout for maintenance of WB status. Pt was educated on precautions, appropriate activity progression, and car transfer. Pt currently with functional limitations due to the deficits listed below (see PT Problem List). Pt will benefit from skilled PT to increase their independence and safety with mobility to allow discharge to the venue listed below.         Recommendations for follow up therapy are one component of a multi-disciplinary discharge planning process, led by the attending physician.  Recommendations may be updated based on patient status, additional functional criteria and insurance authorization.  Follow Up Recommendations No PT follow up      Assistance Recommended at Discharge PRN  Patient can return home with the following  A little help with walking and/or transfers;A little help with bathing/dressing/bathroom;Assistance with cooking/housework;Assist for transportation;Help with stairs or ramp for entrance    Equipment Recommendations None recommended by PT  Recommendations for Other Services       Functional Status Assessment Patient has had a recent decline in their functional status and demonstrates the ability to make significant improvements in function in a reasonable and predictable amount of time.     Precautions / Restrictions Precautions Precautions: Back Precaution Booklet Issued: Yes (comment) Precaution Comments:  Reviewed precautions and WB restrictions throughout functional mobility. Required Braces or Orthoses:  (No brace needed order) Restrictions Weight Bearing Restrictions: Yes LLE Weight Bearing: Touchdown weight bearing      Mobility  Bed Mobility Overal bed mobility: Modified Independent             General bed mobility comments: HOB flat and rails lowered to simulate home environment.    Transfers Overall transfer level: Needs assistance Equipment used: Rolling walker (2 wheels) Transfers: Sit to/from Stand Sit to Stand: Min guard           General transfer comment: VC's to maintain weight bearing status. Pt asking to boost bed height up significantly but was able to complete from lower bed height to simulate home.    Ambulation/Gait Ambulation/Gait assistance: Min guard Gait Distance (Feet): 150 Feet Assistive device: Rolling walker (2 wheels) Gait Pattern/deviations: Step-to pattern, Decreased stride length, Trunk flexed, Narrow base of support Gait velocity: Decreased Gait velocity interpretation: 1.31 - 2.62 ft/sec, indicative of limited community ambulator   General Gait Details: Slow but generally steady with RW for support. Pt reports she is maintaining TDWB status however appears to be putting weight through the LLE. VC's for optimal WB maintenance. Pt required 2 couple short standing rest breaks due to UE fatigue.  Stairs Stairs: Yes Stairs assistance: Min assist Stair Management: No rails, Step to pattern, Backwards, With walker Number of Stairs: 2 General stair comments: VC's for sequencing and safety with attempting with RW. Partner present for education.  Wheelchair Mobility    Modified Rankin (Stroke Patients Only)       Balance Overall balance assessment: Needs assistance Sitting-balance support: No upper extremity supported, Feet supported, Feet unsupported Sitting balance-Leahy Scale: Good  Standing balance support: No upper extremity  supported, During functional activity, Reliant on assistive device for balance Standing balance-Leahy Scale: Poor                               Pertinent Vitals/Pain Pain Assessment Pain Assessment: Faces Faces Pain Scale: Hurts little more Pain Location: operative site Pain Descriptors / Indicators: Discomfort, Operative site guarding Pain Intervention(s): Limited activity within patient's tolerance, Monitored during session, Repositioned    Home Living Family/patient expects to be discharged to:: Private residence Living Arrangements: Alone Available Help at Discharge: Family;Available PRN/intermittently Type of Home: House Home Access: Stairs to enter Entrance Stairs-Rails: Left Entrance Stairs-Number of Steps: 3   Home Layout: One level Home Equipment: Conservation officer, nature (2 wheels);Adaptive equipment;Shower seat;BSC/3in1      Prior Function Prior Level of Function : Independent/Modified Independent;Driving               ADLs Comments: Pt reporting she was independent, and that significant other assisted occaionally with more strenuous home management tasks such as moving furniture.     Hand Dominance   Dominant Hand: Right    Extremity/Trunk Assessment   Upper Extremity Assessment Upper Extremity Assessment: Defer to OT evaluation    Lower Extremity Assessment Lower Extremity Assessment: Generalized weakness    Cervical / Trunk Assessment Cervical / Trunk Assessment: Back Surgery  Communication   Communication: No difficulties  Cognition Arousal/Alertness: Awake/alert Behavior During Therapy: WFL for tasks assessed/performed Overall Cognitive Status: Within Functional Limits for tasks assessed                                          General Comments      Exercises     Assessment/Plan    PT Assessment Patient needs continued PT services  PT Problem List Decreased strength;Decreased activity tolerance;Decreased  balance;Decreased mobility;Decreased knowledge of use of DME;Decreased knowledge of precautions;Decreased safety awareness;Pain       PT Treatment Interventions DME instruction;Gait training;Stair training;Functional mobility training;Therapeutic activities;Therapeutic exercise;Balance training;Patient/family education    PT Goals (Current goals can be found in the Care Plan section)  Acute Rehab PT Goals Patient Stated Goal: Home today PT Goal Formulation: With patient/family Time For Goal Achievement: 01/20/22 Potential to Achieve Goals: Good    Frequency Min 5X/week     Co-evaluation               AM-PAC PT "6 Clicks" Mobility  Outcome Measure Help needed turning from your back to your side while in a flat bed without using bedrails?: None Help needed moving from lying on your back to sitting on the side of a flat bed without using bedrails?: A Little Help needed moving to and from a bed to a chair (including a wheelchair)?: A Little Help needed standing up from a chair using your arms (e.g., wheelchair or bedside chair)?: A Little Help needed to walk in hospital room?: A Little Help needed climbing 3-5 steps with a railing? : A Little 6 Click Score: 19    End of Session Equipment Utilized During Treatment: Gait belt Activity Tolerance: Patient tolerated treatment well Patient left: in bed;with call bell/phone within reach;with family/visitor present Nurse Communication: Mobility status PT Visit Diagnosis: Unsteadiness on feet (R26.81);Pain Pain - part of body:  (back)    Time: 0938-1829 PT Time Calculation (min) (ACUTE  ONLY): 29 min   Charges:   PT Evaluation $PT Eval Low Complexity: 1 Low PT Treatments $Gait Training: 8-22 mins        Rolinda Roan, PT, DPT Acute Rehabilitation Services Secure Chat Preferred Office: [phone]   Thelma Comp 01/13/2022, 12:41 PM

## 2022-01-13 NOTE — Progress Notes (Signed)
    Subjective: Procedure(s) (LRB): SPINAL CORD STIMULATOR PLACEMENT, LEFT SACROILIAC FUSION (Left) LUMBAR SPINAL CORD STIMULATOR INSERTION (Left) 1 Day Post-Op  Patient reports pain as 3 on 0-10 scale.  Reports decreased leg pain reports incisional back pain   Positive void Negative bowel movement Positive flatus Negative chest pain or shortness of breath  Objective: Vital signs in last 24 hours: Temp:  [97.9 F (36.6 C)-99.3 F (37.4 C)] 99.1 F (37.3 C) (08/04 0444) Pulse Rate:  [63-89] 74 (08/04 0444) Resp:  [10-20] 18 (08/04 0444) BP: (59-133)/(36-69) 109/51 (08/04 0444) SpO2:  [92 %-100 %] 98 % (08/04 0444) Weight:  [62.2 kg] 62.2 kg (08/03 1002)  Intake/Output from previous day: 08/03 0701 - 08/04 0700 In: 200 [IV Piggyback:200] Out: 400 [Urine:300; Blood:100]  Labs: No results for input(s): "WBC", "RBC", "HCT", "PLT" in the last 72 hours. No results for input(s): "NA", "K", "CL", "CO2", "BUN", "CREATININE", "GLUCOSE", "CALCIUM" in the last 72 hours. No results for input(s): "LABPT", "INR" in the last 72 hours.  Physical Exam: Neurologically intact ABD soft Intact pulses distally Dorsiflexion/Plantar flexion intact Incision: dressing C/D/I Compartment soft Body mass index is 23.55 kg/m.   Assessment/Plan: Patient stable  xrays n/a Continue mobilization with physical therapy Continue care  Patient is doing quite well overall.  Her primary complaint is the mid thoracic pain from the surgery site.  She is ambulating.  Neurologically intact.  And her pain is well controlled with oral medications.  We will plan on discharge to home.  She will follow-up with me in 2 weeks.  Melina Schools, MD [facility] [phone]

## 2022-01-13 NOTE — Progress Notes (Signed)
Patient alert and oriented, voiding adequately, skin clean, dry and intact without evidence of skin break down, or symptoms of complications - no redness or edema noted, only slight tenderness at site.  Patient states pain is manageable at time of discharge. Patient has an appointment with MD in 2 weeks 

## 2022-01-13 NOTE — Evaluation (Signed)
Occupational Therapy Evaluation Patient Details Name: Courtney [name] MRN: [id number] DOB: 1950/09/20 Today's Date: 01/13/2022   History of Present Illness Pt is a 71 y.o. female s/p Lumbar spinal cord stimulator placement, L sacroiliac fusion. PMH significant for ADHD, anemia, bicuspid aotic valve, mitral regurgitation, numbness and tingling, arthritis, fibromyalgia, and sleep apnea.   Clinical Impression   PTA, pt lived alone, but her significant other came to stay frequently and will be available to assist upon discharge. Pt was independent and driving. Currently, pt performing functional mobility and LB ADL with min guard A for safety. Pt maintaining LLE toe touch precautions throughout session with 1-2 verbal reminders. Pt educated and demonstrating compensatory techniques for LB dressing, brace application, oral care, shower transfers, toileting, and bed mobility within spinal and weight bearing precautions. All education provided; all questions answered. Recommend discharge home with no follow up OT at this time. OT to sign off. Re-consult if change in status.      Recommendations for follow up therapy are one component of a multi-disciplinary discharge planning process, led by the attending physician.  Recommendations may be updated based on patient status, additional functional criteria and insurance authorization.   Follow Up Recommendations  No OT follow up    Assistance Recommended at Discharge Intermittent Supervision/Assistance  Patient can return home with the following A little help with walking and/or transfers;A little help with bathing/dressing/bathroom;Assist for transportation;Assistance with cooking/housework    Functional Status Assessment  Patient has had a recent decline in their functional status and demonstrates the ability to make significant improvements in function in a reasonable and predictable amount of time.  Equipment Recommendations  None recommended by  OT (Pt reporting she has all needed equipment)    Recommendations for Other Services       Precautions / Restrictions Precautions Precautions: Back Precaution Booklet Issued: Yes (comment) Precaution Comments: Pt recalling 3/3 spinal precautions. Handout reviewed Required Braces or Orthoses:  (No brace needed order) Restrictions Weight Bearing Restrictions: Yes LLE Weight Bearing: Touchdown weight bearing      Mobility Bed Mobility Overal bed mobility: Modified Independent             General bed mobility comments: Log roll with mod I. Pt recalling from prior operations    Transfers Overall transfer level: Needs assistance Equipment used: Rolling walker (2 wheels) Transfers: Sit to/from Stand Sit to Stand: Min guard           General transfer comment: Occasional cues to remind for only toe touch weight bearing      Balance Overall balance assessment: Needs assistance Sitting-balance support: No upper extremity supported, Feet supported, Feet unsupported Sitting balance-Leahy Scale: Good Sitting balance - Comments: Able to obtain figure 4 position with no physical A   Standing balance support: No upper extremity supported, During functional activity Standing balance-Leahy Scale: Poor Standing balance comment: Reliand on RW due to toe touch precautions                           ADL either performed or assessed with clinical judgement   ADL Overall ADL's : Needs assistance/impaired Eating/Feeding: Set up;Sitting   Grooming: Supervision/safety;Standing   Upper Body Bathing: Sitting;Set up   Lower Body Bathing: Min guard;Sit to/from stand   Upper Body Dressing : Set up;Sitting   Lower Body Dressing: Min guard;Sit to/from stand Lower Body Dressing Details (indicate cue type and reason): Demonstrating LB dressing within precautions Toilet Transfer: Min guard;Ambulation;Rolling  walker (2 wheels);BSC/3in1   Toileting- Clothing Manipulation and  Hygiene: Set up;Supervision/safety;Sitting/lateral lean Toileting - Clothing Manipulation Details (indicate cue type and reason): Performing anterior pericare during session. No lateral lean (access between legs) Tub/ Shower Transfer: Min guard;Shower seat;Rolling walker (2 wheels);Ambulation Tub/Shower Transfer Details (indicate cue type and reason): Reviewed compensatory techniques. Pt reports she will likely sponge bathe until MD releases her from weight bearing precautions Functional mobility during ADLs: Min guard;Rolling walker (2 wheels) General ADL Comments: All education provided     Vision Baseline Vision/History: 1 Wears glasses Vision Assessment?: No apparent visual deficits     Perception     Praxis      Pertinent Vitals/Pain Pain Assessment Pain Assessment: Faces Faces Pain Scale: Hurts little more Pain Location: operative site Pain Descriptors / Indicators: Discomfort, Operative site guarding Pain Intervention(s): Limited activity within patient's tolerance, Monitored during session     Hand Dominance     Extremity/Trunk Assessment Upper Extremity Assessment Upper Extremity Assessment: Generalized weakness   Lower Extremity Assessment Lower Extremity Assessment: Defer to PT evaluation   Cervical / Trunk Assessment Cervical / Trunk Assessment: Back Surgery   Communication Communication Communication: No difficulties   Cognition Arousal/Alertness: Awake/alert Behavior During Therapy: WFL for tasks assessed/performed Overall Cognitive Status: Within Functional Limits for tasks assessed                                 General Comments: Recalling precautions     General Comments  VSS    Exercises     Shoulder Instructions      Home Living Family/patient expects to be discharged to:: Private residence Living Arrangements: Alone Available Help at Discharge: Family;Available PRN/intermittently Type of Home: House Home Access: Stairs to  enter Entrance Stairs-Number of Steps: 3 Entrance Stairs-Rails: Left Home Layout: One level     Bathroom Shower/Tub: Walk-in shower (Has tub and walk in)   Bathroom Toilet: Handicapped height     Home Equipment: Conservation officer, nature (2 wheels);Adaptive equipment;Shower seat;BSC/3in1 Adaptive Equipment: Sock aid        Prior Functioning/Environment Prior Level of Function : Independent/Modified Independent;Driving               ADLs Comments: Pt reporting she was independent, and that significant other assisted occaionally with more strenuous home management tasks such as moving furniture.        OT Problem List: Decreased strength;Decreased activity tolerance;Impaired balance (sitting and/or standing);Pain      OT Treatment/Interventions:      OT Goals(Current goals can be found in the care plan section) Acute Rehab OT Goals Patient Stated Goal: No more surgery OT Goal Formulation: With patient  OT Frequency:      Co-evaluation              AM-PAC OT "6 Clicks" Daily Activity     Outcome Measure Help from another person eating meals?: A Little Help from another person taking care of personal grooming?: A Little Help from another person toileting, which includes using toliet, bedpan, or urinal?: A Little Help from another person bathing (including washing, rinsing, drying)?: A Little Help from another person to put on and taking off regular upper body clothing?: A Little Help from another person to put on and taking off regular lower body clothing?: A Little 6 Click Score: 18   End of Session Equipment Utilized During Treatment: Gait belt;Rolling walker (2 wheels) Nurse Communication: Mobility status (Request  to put weight bearing precautions in chart due to procedure)  Activity Tolerance: Patient tolerated treatment well Patient left: in bed;with call bell/phone within reach  OT Visit Diagnosis: Unsteadiness on feet (R26.81);Muscle weakness (generalized)  (M62.81);Pain Pain - part of body:  (operative site)                Time: 4287-6811 OT Time Calculation (min): 22 min Charges:  OT General Charges $OT Visit: 1 Visit OT Evaluation $OT Eval Low Complexity: 1 Low  Shanda Howells, OTR/L [facility] Acute Rehabilitation Office: [phone]   Lula Olszewski 01/13/2022, 9:15 AM

## 2022-01-18 ENCOUNTER — Ambulatory Visit: Payer: Medicare Other | Admitting: Family Medicine

## 2022-01-20 ENCOUNTER — Encounter (HOSPITAL_COMMUNITY): Payer: Self-pay | Admitting: Orthopedic Surgery

## 2022-02-07 DIAGNOSIS — H524 Presbyopia: Secondary | ICD-10-CM | POA: Diagnosis not present

## 2022-02-07 DIAGNOSIS — H2513 Age-related nuclear cataract, bilateral: Secondary | ICD-10-CM | POA: Diagnosis not present

## 2022-02-17 DIAGNOSIS — L603 Nail dystrophy: Secondary | ICD-10-CM | POA: Diagnosis not present

## 2022-02-17 DIAGNOSIS — M21962 Unspecified acquired deformity of left lower leg: Secondary | ICD-10-CM | POA: Diagnosis not present

## 2022-02-17 DIAGNOSIS — B351 Tinea unguium: Secondary | ICD-10-CM | POA: Diagnosis not present

## 2022-02-17 DIAGNOSIS — G5762 Lesion of plantar nerve, left lower limb: Secondary | ICD-10-CM | POA: Diagnosis not present

## 2022-02-17 DIAGNOSIS — I70203 Unspecified atherosclerosis of native arteries of extremities, bilateral legs: Secondary | ICD-10-CM | POA: Diagnosis not present

## 2022-02-21 DIAGNOSIS — Z4889 Encounter for other specified surgical aftercare: Secondary | ICD-10-CM | POA: Diagnosis not present

## 2022-03-01 ENCOUNTER — Ambulatory Visit: Payer: Medicare Other | Admitting: Family Medicine

## 2022-03-01 DIAGNOSIS — H2511 Age-related nuclear cataract, right eye: Secondary | ICD-10-CM | POA: Diagnosis not present

## 2022-03-01 DIAGNOSIS — H269 Unspecified cataract: Secondary | ICD-10-CM | POA: Diagnosis not present

## 2022-03-15 DIAGNOSIS — H2512 Age-related nuclear cataract, left eye: Secondary | ICD-10-CM | POA: Diagnosis not present

## 2022-03-15 DIAGNOSIS — H269 Unspecified cataract: Secondary | ICD-10-CM | POA: Diagnosis not present

## 2022-03-21 ENCOUNTER — Ambulatory Visit: Payer: Medicare Other | Admitting: Family Medicine

## 2022-03-23 DIAGNOSIS — G5762 Lesion of plantar nerve, left lower limb: Secondary | ICD-10-CM | POA: Diagnosis not present

## 2022-04-03 DIAGNOSIS — H33302 Unspecified retinal break, left eye: Secondary | ICD-10-CM | POA: Diagnosis not present

## 2022-04-03 NOTE — Progress Notes (Signed)
[facility] Note  04/04/2022     CHIEF COMPLAINT Patient presents for Retina Evaluation   HISTORY OF PRESENT ILLNESS: Courtney [name] is a 71 y.o. female who presents to the clinic today for:   HPI     Retina Evaluation   In left eye.  This started weeks ago.  Associated Symptoms Flashes.  Negative for Floaters.  I, the attending physician,  performed the HPI with the patient and updated documentation appropriately.        Comments   Patient here is based on a referral from Dr. Valetta Close for a retinal tear left eye. She is seeing light in the lower left quadrant, she compares it to a migraine ora. This has been happening about 5 days. She had cataract surgery on the left eye 03/15/22.       Last edited by Bernarda Caffey, MD on 04/04/2022 12:25 PM.    Pt is here on the referral of Dr. Valetta Close for concern of retinal tear OS, pt states she went to see him bc she was been seeing a cluster of lights in her left eye that start in the lower left quadrant and move upwards, she says it looks like a migraine ora, she had cataract sx OS on 10.04.23, pt states Dr. Valetta Close told her she has a retinal tear OS that was likely caused by the cataract sx, pt states if she blinks or turns her head the lights will disappear, pt denies being hypertensive or diabetic  Referring physician: Jola Schmidt, MD [place],  [place] [address]  HISTORICAL INFORMATION:   Selected notes from the MEDICAL RECORD NUMBER Referred by Dr. Valetta Close for concern of operculated tear OS LEE:  Ocular Hx- PMH-    CURRENT MEDICATIONS: Current Outpatient Medications (Ophthalmic Drugs)  Medication Sig   Artificial Tear Solution (GENTEAL TEARS OP) Place 1 drop into both eyes daily as needed (dry eyes).   Carboxymethylcellul-Glycerin (REFRESH OPTIVE OP) Place 1 drop into both eyes daily as needed (dry eyes).   No current facility-administered medications for this visit. (Ophthalmic Drugs)    Current Outpatient Medications (Other)  Medication Sig   Ascorbic Acid (VITAMIN C) 1000 MG tablet Take 1,000 mg by mouth in the morning and at bedtime.   atorvastatin (LIPITOR) 10 MG tablet Take 10 mg by mouth daily.   CALCIUM CITRATE PO Take 3,000 mg by mouth at bedtime.   cholecalciferol (VITAMIN D3) 25 MCG (1000 UNIT) tablet Take 1,000 Units by mouth 2 (two) times daily.   Coenzyme Q10 300 MG CAPS Take 300 mg by mouth daily.    docusate sodium (COLACE) 100 MG capsule Take 100 mg by mouth 2 (two) times daily.   DULoxetine (CYMBALTA) 30 MG capsule Take 30 mg by mouth daily after breakfast.    EPINEPHrine (EPIPEN JR) 0.15 MG/0.3ML injection Inject 0.15 mg into the muscle as needed for anaphylaxis.   famotidine (PEPCID) 20 MG tablet Take 20 mg by mouth at bedtime.   FERROUS BISGLYCINATE CHELATE PO Take 25 mg by mouth daily.    ferrous sulfate 325 (65 FE) MG tablet Take 325 mg by mouth daily.   fluticasone (FLONASE) 50 MCG/ACT nasal spray Place 1 spray into both nostrils at bedtime.    levothyroxine (SYNTHROID, LEVOTHROID) 25 MCG tablet Take 1 1/2 tablets daily (Patient taking differently: Take 37.5 mcg by mouth daily before breakfast. Take 1 1/2 tablets daily)   loratadine (CLARITIN) 10 MG tablet Take 10 mg  by mouth daily as needed (for seasonal allergies (Spring/Summer)).    ondansetron (ZOFRAN) 4 MG tablet Take 1 tablet (4 mg total) by mouth every 8 (eight) hours as needed for nausea or vomiting.   psyllium (REGULOID) 0.52 g capsule Take 0.52 g by mouth in the morning and at bedtime.   Tart Cherry 1200 MG CAPS Take 1,200 mg by mouth daily.   traZODone (DESYREL) 100 MG tablet Take 50 mg by mouth at bedtime.   No current facility-administered medications for this visit. (Other)   REVIEW OF SYSTEMS: ROS   Positive for: Neurological, Musculoskeletal, Cardiovascular, Eyes Negative for: Constitutional, Gastrointestinal, Skin, Genitourinary, HENT, Endocrine, Respiratory, Psychiatric,  Allergic/Imm, Heme/Lymph Last edited by Kingsley Spittle, COT on 04/04/2022 10:34 AM.     ALLERGIES Allergies  Allergen Reactions   Fish Allergy Anaphylaxis    Can tolerate shellfish (can eat tuna, salmon, trout, swordfish and redrum). Allergic to fish with scales and fins.   Peanut Butter Flavor Anaphylaxis    Allergic to peanuts and some tree nuts. Can eat cashews, pistachios, and almonds.    PAST MEDICAL HISTORY Past Medical History:  Diagnosis Date   ADHD    ADD   Allergic to pets    pet dander   Allergies    Anemia    take iron   Anxiety    Arthritis    hands, hip, shoulders back,   Asthma    as a child   Bicuspid aortic valve    bicuspid AV with mild AS, ascending aorta 3.40 cm 2/42/35 echo   Complication of anesthesia    slow to wake up and lingers for a long time   Concussion    Depression    Family history of adverse reaction to anesthesia    mom had skin reaction to anesthesia but cant remember what it was or exactly what happened   Fatigue    Fibromyalgia    GERD (gastroesophageal reflux disease)    Hyperlipidemia    Hypothyroidism    Irregular periods    Joint pain    Joint stiffness    Lower back pain    Menopause    Migraines    no longer   Mitral regurgitation    Numbness and tingling    Pollen allergy    Presence of pessary    Ringing in ears    Sleep apnea    does not wear CPAP tapes mouth at night   Snoring    Past Surgical History:  Procedure Laterality Date   ABDOMINAL EXPOSURE N/A 04/12/2020   Procedure: ABDOMINAL EXPOSURE;  Surgeon: [name], MD;  [facility];  Service: Vascular;  Laterality: N/A;   ABDOMINOPLASTY     ANTERIOR CERVICAL DECOMP/DISCECTOMY FUSION N/A 06/28/2016   Procedure: ACDF C5-7 ANTERIOR CERVICAL DECOMPRESSION/DISCECTOMY FUSION 2 LEVELS;  Surgeon: Melina Schools, MD;  Location: [facility];  Service: Orthopedics;  Laterality: N/A;  Requests 3 hrs   ANTERIOR LUMBAR FUSION N/A 04/12/2020   Procedure:  Anterior Lumbar Interbody Fusion - Lumbar four-Lumbar five , posterior instrumented fusion Lumbar four-five lumbar three -four;  Surgeon: Eustace Moore, MD;  Location: [facility];  Service: Neurosurgery;  Laterality: N/A;   BREAST BIOPSY Right 09/19/2021   CARPAL TUNNEL RELEASE Bilateral 1996   COLONOSCOPY     DENTAL SURGERY     HERNIA REPAIR     times 2   LAMINECTOMY WITH POSTERIOR LATERAL ARTHRODESIS LEVEL 1 N/A 04/12/2020   Procedure: Posterior Instrumented Fusion Lumbar  three to lumbar five;  Surgeon: Eustace Moore, MD;  Location: [facility];  Service: Neurosurgery;  Laterality: N/A;   LUMBAR LAMINECTOMY/DECOMPRESSION MICRODISCECTOMY  04/21/2015   Procedure: DECOMPRESSION LUMBAR THREE-LUMBAR FOUR;  Surgeon: Melina Schools, MD;  Location: [facility];  Service: Orthopedics;;   LUMBAR LAMINECTOMY/DECOMPRESSION MICRODISCECTOMY N/A 06/20/2017   Procedure: L4-5 decompression, L5-S1 left laminotomy/foraminotomy;  Surgeon: Melina Schools, MD;  Location: [facility];  Service: Orthopedics;  Laterality: N/A;  3 hrs   PARATHYROIDECTOMY  2012   RADIOACTIVE SEED GUIDED EXCISIONAL BREAST BIOPSY Right 10/28/2021   Procedure: RADIOACTIVE SEED GUIDED EXCISIONAL RIGHT BREAST BIOPSY;  Surgeon: Stark Klein, MD;  Location: [facility];  Service: General;  Laterality: Right;   SACROILIAC JOINT FUSION Right 07/24/2018   Procedure: SACROILIAC JOINT FUSION;  Surgeon: Melina Schools, MD;  Location: [facility];  Service: Orthopedics;  Laterality: Right;  90 mins   SACROILIAC JOINT FUSION Left 01/12/2022   Procedure: SPINAL CORD STIMULATOR PLACEMENT, LEFT SACROILIAC FUSION;  Surgeon: Melina Schools, MD;  Location: [facility];  Service: Orthopedics;  Laterality: Left;   SHOULDER ARTHROSCOPY W/ ACROMIAL REPAIR Right    SPINAL CORD STIMULATOR INSERTION Left 01/12/2022   Procedure: LUMBAR SPINAL CORD STIMULATOR INSERTION;  Surgeon: Melina Schools, MD;  Location: [facility];  Service: Orthopedics;  Laterality: Left;   TONSILLECTOMY     TOTAL  HIP ARTHROPLASTY Right 11/10/2020   Procedure: TOTAL HIP ARTHROPLASTY ANTERIOR APPROACH;  Surgeon: Gaynelle Arabian, MD;  Location: [facility];  Service: Orthopedics;  Laterality: Right;  154mn   FAMILY HISTORY Family History  Problem Relation Age of Onset   COPD Mother    Cancer Mother    Hypertension Mother    Hyperlipidemia Mother    Heart disease Mother    Thyroid disease Mother    Thyroid disease Sister    Thyroid disease Daughter    SOCIAL HISTORY Social History   Tobacco Use   Smoking status: Former    Packs/day: 0.25    Years: 4.00    Total pack years: 1.00    Types: Cigarettes    Quit date: 1976    Years since quitting: 47.8   Smokeless tobacco: Never   Tobacco comments:    social smoker stop at age 71 Vaping Use   Vaping Use: Never used  Substance Use Topics   Alcohol use: Yes    Alcohol/week: 1.0 - 2.0 standard drink of alcohol    Types: 1 - 2 Glasses of wine per week    Comment: socially   Drug use: No       OPHTHALMIC EXAM:  Base Eye Exam     Visual Acuity (Snellen - Linear)       Right Left   Dist [place] 20/20 20/25 +2    Correction: Glasses         Tonometry (Tonopen, 9:36 AM)       Right Left   Pressure 15 13         Pupils       Dark Light Shape React APD   Right 2 2 Round Minimal None   Left 2 2 Round Minimal None         Visual Fields       Left Right    Full Full         Extraocular Movement       Right Left    Full, Ortho Full, Ortho         Neuro/Psych  Oriented x3: Yes         Dilation     Both eyes: 1.0% Mydriacyl, 2.5% Phenylephrine @ 9:32 AM           Slit Lamp and Fundus Exam     Slit Lamp Exam       Right Left   Lids/Lashes Dermatochalasis - upper lid, mild MGD Dermatochalasis - upper lid, mild MGD   Conjunctiva/Sclera White and quiet White and quiet   Cornea 1-2+ Punctate epithelial erosions, well healed cataract wound 2+ Punctate epithelial erosions, arcus, well healed cataract wound    Anterior Chamber deep and clear deep and clear   Iris Round and dilated Round and dilated   Lens PC IOL in good position PC IOL in good position   Anterior Vitreous Vitreous syneresis, Posterior vitreous detachment Vitreous syneresis, no pigment, Posterior vitreous detachment, operculum ST periphery         Fundus Exam       Right Left   Disc Pink and Sharp, mild tilt, focal PPP temporal Pink, +elevation, mild blurring of nasal margin   C/D Ratio 0.2 0.3   Macula Flat, Blunted foveal reflex, mild RPE mottling, No heme or edema Flat, Blunted foveal reflex, mild RPE mottling, No heme or edema   Vessels attenuated, mild copper wiring, Tortuous attenuated, mild copper wiring, Tortuous   Periphery Attached, No heme Attached, pigmented lattice with VR tuft at 0430, operculated hole at 0130 ora, No heme           IMAGING AND PROCEDURES  Imaging and Procedures for 04/04/2022  OCT, Retina - OU - Both Eyes       Right Eye Quality was good. Central Foveal Thickness: 257. Progression has no prior data. Findings include normal foveal contour, no IRF, no SRF.   Left Eye Quality was good. Central Foveal Thickness: 263. Progression has no prior data. Findings include normal foveal contour, no IRF, no SRF.   Notes *Images captured and stored on drive  Diagnosis / Impression:  NFP, no IRF/SRF OU  Clinical management:  See below  Abbreviations: NFP - Normal foveal profile. CME - cystoid macular edema. PED - pigment epithelial detachment. IRF - intraretinal fluid. SRF - subretinal fluid. EZ - ellipsoid zone. ERM - epiretinal membrane. ORA - outer retinal atrophy. ORT - outer retinal tubulation. SRHM - subretinal hyper-reflective material. IRHM - intraretinal hyper-reflective material      Repair Retinal Breaks, Laser - OS - Left Eye       LASER PROCEDURE NOTE  Procedure:  Barrier laser retinopexy using slit lamp laser, left eye   Diagnoses:   Operculated retinal hole, left eye,  at 0130                     Pigmented lattice degeneration with VR tuft, left eye, at 0430                           Surgeon: Bernarda Caffey, MD, PhD  Anesthesia: Topical  Informed consent obtained, operative eye marked, and time out performed prior to initiation of laser.   Laser settings:  Lumenis [id number] laser indirect ophthalmoscope Power: 280 mW Duration: 70 msec  # spots: 438  Placement of laser: Laser was placed in three confluent rows around operculated hole at 0130 oclock anterior to equator and around pigmented lattice degeneration w/ VR tuft at 0430.  Complications: None.  Patient tolerated the procedure well and received written  and verbal post-procedure care information/education.            ASSESSMENT/PLAN:    ICD-10-CM   1. Retinal hole of left eye  H33.322 OCT, Retina - OU - Both Eyes    Repair Retinal Breaks, Laser - OS - Left Eye    2. Lattice degeneration of left retina  H35.412 Repair Retinal Breaks, Laser - OS - Left Eye    3. Vitreoretinal tuft of left eye  Q14.1 Repair Retinal Breaks, Laser - OS - Left Eye    4. Pseudophakia, both eyes  Z96.1     5. Dry eyes  H04.123      1. Operculated retinal hole, OS   - The incidence, risk factors, and natural history of retinal tear was discussed with patient.   - Potential treatment options including laser retinopexy and cryotherapy discussed with patient. - operculated hole at 0130 ora - recommend laser retinopexy OS today, 10.24.23 - RBA of procedure discussed, questions answered - informed consent obtained and signed - see procedure note - start Lotemax QID x7 days - f/u in 2 wks--POV, DFE, OCT  2,3. Lattice degeneration w/ VR tuft, left eye - pigmented lattice with VR tuft at 0430 - discussed findings, prognosis, and treatment options including observation - recommend laser retinopexy OS today as above - f/u in 2 wks, sooner prn -- POV  4. Pseudophakia OU  - s/p CE/IOL (Dr. Valetta Close, OD:  09.23.23, OS: 10.04.23)  - IOL in good position, doing well  - monitor  5. Dry eyes OU - recommend artificial tears and lubricating ointment as needed   Ophthalmic Meds Ordered this visit:  No orders of the defined types were placed in this encounter.    Return for f/u 2 weeks, retinal hole OS, DFE, OCT.  There are no Patient Instructions on file for this visit.  Explained the diagnoses, plan, and follow up with the patient and they expressed understanding.  Patient expressed understanding of the importance of proper follow up care.   This document serves as a record of services personally performed by Gardiner Sleeper, MD, PhD. It was created on their behalf by San Jetty. Owens Shark, OA an ophthalmic technician. The creation of this record is the provider's dictation and/or activities during the visit.    Electronically signed by: San Jetty. Owens Shark, [place] 10.24.2023 12:33 PM  Gardiner Sleeper, M.D., Ph.D. Diseases & Surgery of the Retina and Vitreous [facility]  I have reviewed the above documentation for accuracy and completeness, and I agree with the above. Gardiner Sleeper, M.D., Ph.D. 04/04/22 12:33 PM  Abbreviations: M myopia (nearsighted); A astigmatism; H hyperopia (farsighted); P presbyopia; Mrx spectacle prescription;  CTL contact lenses; OD right eye; OS left eye; OU both eyes  XT exotropia; ET esotropia; PEK punctate epithelial keratitis; PEE punctate epithelial erosions; DES dry eye syndrome; MGD meibomian gland dysfunction; ATs artificial tears; PFAT's preservative free artificial tears; Kraemer nuclear sclerotic cataract; PSC posterior subcapsular cataract; ERM epi-retinal membrane; PVD posterior vitreous detachment; RD retinal detachment; DM diabetes mellitus; DR diabetic retinopathy; NPDR non-proliferative diabetic retinopathy; PDR proliferative diabetic retinopathy; CSME clinically significant macular edema; DME diabetic macular edema; dbh dot blot hemorrhages; CWS  cotton wool spot; POAG primary open angle glaucoma; C/D cup-to-disc ratio; HVF humphrey visual field; GVF goldmann visual field; OCT optical coherence tomography; IOP intraocular pressure; BRVO Branch retinal vein occlusion; CRVO central retinal vein occlusion; CRAO central retinal artery occlusion; BRAO branch retinal artery occlusion; RT  retinal tear; SB scleral buckle; PPV pars plana vitrectomy; VH Vitreous hemorrhage; PRP panretinal laser photocoagulation; IVK intravitreal kenalog; VMT vitreomacular traction; MH Macular hole;  NVD neovascularization of the disc; NVE neovascularization elsewhere; AREDS age related eye disease study; ARMD age related macular degeneration; POAG primary open angle glaucoma; EBMD epithelial/anterior basement membrane dystrophy; ACIOL anterior chamber intraocular lens; IOL intraocular lens; PCIOL posterior chamber intraocular lens; Phaco/IOL phacoemulsification with intraocular lens placement; PRK photorefractive keratectomy; LASIK laser assisted in situ keratomileusis; HTN hypertension; DM diabetes mellitus; COPD chronic obstructive pulmonary disease  

## 2022-04-04 ENCOUNTER — Ambulatory Visit (INDEPENDENT_AMBULATORY_CARE_PROVIDER_SITE_OTHER): Payer: Medicare Other | Admitting: Ophthalmology

## 2022-04-04 ENCOUNTER — Encounter (INDEPENDENT_AMBULATORY_CARE_PROVIDER_SITE_OTHER): Payer: Self-pay | Admitting: Ophthalmology

## 2022-04-04 DIAGNOSIS — H33322 Round hole, left eye: Secondary | ICD-10-CM | POA: Diagnosis not present

## 2022-04-04 DIAGNOSIS — H04123 Dry eye syndrome of bilateral lacrimal glands: Secondary | ICD-10-CM | POA: Diagnosis not present

## 2022-04-04 DIAGNOSIS — H35412 Lattice degeneration of retina, left eye: Secondary | ICD-10-CM | POA: Diagnosis not present

## 2022-04-04 DIAGNOSIS — H3581 Retinal edema: Secondary | ICD-10-CM

## 2022-04-04 DIAGNOSIS — Z961 Presence of intraocular lens: Secondary | ICD-10-CM

## 2022-04-04 DIAGNOSIS — Q141 Congenital malformation of retina: Secondary | ICD-10-CM | POA: Diagnosis not present

## 2022-04-04 NOTE — Progress Notes (Signed)
[facility] Note  04/18/2022     CHIEF COMPLAINT Patient presents for Retina Follow Up   HISTORY OF PRESENT ILLNESS: Courtney [name] is a 71 y.o. female who presents to the clinic today for:   HPI     Retina Follow Up   Patient presents with  Other.  In left eye.  This started 2 weeks ago.  I, the attending physician,  performed the HPI with the patient and updated documentation appropriately.        Comments   Patient here for 2 weeks retina follow up for retina hole OS. Patient states vision has greatly improved since surgery. Still has the yellow sparkle. There has been a little black floater in OS. Has no tail. Just a dot. Not there all the time. When shows up disappears quickly. No eye pain.       Last edited by Bernarda Caffey, MD on 04/18/2022 12:47 PM.    Pt states she is seeing a floater OS that looks like a dot, she is still seeing the yellow sparkle as well, but not all the time  Referring physician: Kelton Pillar, [address] [place],  [place] [address]  HISTORICAL INFORMATION:   Selected notes from the MEDICAL RECORD NUMBER Referred by Dr. Valetta Close for concern of operculated tear OS LEE:  Ocular Hx- PMH-    CURRENT MEDICATIONS: Current Outpatient Medications (Ophthalmic Drugs)  Medication Sig   Artificial Tear Solution (GENTEAL TEARS OP) Place 1 drop into both eyes daily as needed (dry eyes).   Carboxymethylcellul-Glycerin (REFRESH OPTIVE OP) Place 1 drop into both eyes daily as needed (dry eyes).   No current facility-administered medications for this visit. (Ophthalmic Drugs)   Current Outpatient Medications (Other)  Medication Sig   Ascorbic Acid (VITAMIN C) 1000 MG tablet Take 1,000 mg by mouth in the morning and at bedtime.   atorvastatin (LIPITOR) 10 MG tablet Take 10 mg by mouth daily.   CALCIUM CITRATE PO Take 3,000 mg by mouth at bedtime.   cholecalciferol (VITAMIN D3) 25 MCG (1000 UNIT) tablet  Take 1,000 Units by mouth 2 (two) times daily.   Coenzyme Q10 300 MG CAPS Take 300 mg by mouth daily.    docusate sodium (COLACE) 100 MG capsule Take 100 mg by mouth 2 (two) times daily.   DULoxetine (CYMBALTA) 30 MG capsule Take 30 mg by mouth daily after breakfast.    EPINEPHrine (EPIPEN JR) 0.15 MG/0.3ML injection Inject 0.15 mg into the muscle as needed for anaphylaxis.   famotidine (PEPCID) 20 MG tablet Take 20 mg by mouth at bedtime.   FERROUS BISGLYCINATE CHELATE PO Take 25 mg by mouth daily.    ferrous sulfate 325 (65 FE) MG tablet Take 325 mg by mouth daily.   fluticasone (FLONASE) 50 MCG/ACT nasal spray Place 1 spray into both nostrils at bedtime.    levothyroxine (SYNTHROID, LEVOTHROID) 25 MCG tablet Take 1 1/2 tablets daily (Patient taking differently: Take 37.5 mcg by mouth daily before breakfast. Take 1 1/2 tablets daily)   loratadine (CLARITIN) 10 MG tablet Take 10 mg by mouth daily as needed (for seasonal allergies (Spring/Summer)).    ondansetron (ZOFRAN) 4 MG tablet Take 1 tablet (4 mg total) by mouth every 8 (eight) hours as needed for nausea or vomiting.   psyllium (REGULOID) 0.52 g capsule Take 0.52 g by mouth in the morning and at bedtime.   Tart Cherry 1200 MG CAPS Take 1,200 mg by  mouth daily.   traZODone (DESYREL) 100 MG tablet Take 50 mg by mouth at bedtime.   No current facility-administered medications for this visit. (Other)   REVIEW OF SYSTEMS: ROS   Positive for: Neurological, Musculoskeletal, Cardiovascular, Eyes Negative for: Constitutional, Gastrointestinal, Skin, Genitourinary, HENT, Endocrine, Respiratory, Psychiatric, Allergic/Imm, Heme/Lymph Last edited by Theodore Demark, COA on 04/18/2022  9:59 AM.     ALLERGIES Allergies  Allergen Reactions   Fish Allergy Anaphylaxis    Can tolerate shellfish (can eat tuna, salmon, trout, swordfish and redrum). Allergic to fish with scales and fins.   Peanut Butter Flavor Anaphylaxis    Allergic to peanuts and  some tree nuts. Can eat cashews, pistachios, and almonds.    PAST MEDICAL HISTORY Past Medical History:  Diagnosis Date   ADHD    ADD   Allergic to pets    pet dander   Allergies    Anemia    take iron   Anxiety    Arthritis    hands, hip, shoulders back,   Asthma    as a child   Bicuspid aortic valve    bicuspid AV with mild AS, ascending aorta 3.40 cm 7/67/20 echo   Complication of anesthesia    slow to wake up and lingers for a long time   Concussion    Depression    Family history of adverse reaction to anesthesia    mom had skin reaction to anesthesia but cant remember what it was or exactly what happened   Fatigue    Fibromyalgia    GERD (gastroesophageal reflux disease)    Hyperlipidemia    Hypothyroidism    Irregular periods    Joint pain    Joint stiffness    Lower back pain    Menopause    Migraines    no longer   Mitral regurgitation    Numbness and tingling    Pollen allergy    Presence of pessary    Ringing in ears    Sleep apnea    does not wear CPAP tapes mouth at night   Snoring    Past Surgical History:  Procedure Laterality Date   ABDOMINAL EXPOSURE N/A 04/12/2020   Procedure: ABDOMINAL EXPOSURE;  Surgeon: [name], MD;  Location: [facility] OR;  Service: Vascular;  Laterality: N/A;   ABDOMINOPLASTY     ANTERIOR CERVICAL DECOMP/DISCECTOMY FUSION N/A 06/28/2016   Procedure: ACDF C5-7 ANTERIOR CERVICAL DECOMPRESSION/DISCECTOMY FUSION 2 LEVELS;  Surgeon: Melina Schools, MD;  Location: [facility];  Service: Orthopedics;  Laterality: N/A;  Requests 3 hrs   ANTERIOR LUMBAR FUSION N/A 04/12/2020   Procedure: Anterior Lumbar Interbody Fusion - Lumbar four-Lumbar five , posterior instrumented fusion Lumbar four-five lumbar three -four;  Surgeon: Eustace Moore, MD;  Location: [facility];  Service: Neurosurgery;  Laterality: N/A;   BREAST BIOPSY Right 09/19/2021   CARPAL TUNNEL RELEASE Bilateral 1996   COLONOSCOPY     DENTAL SURGERY     HERNIA REPAIR      times 2   LAMINECTOMY WITH POSTERIOR LATERAL ARTHRODESIS LEVEL 1 N/A 04/12/2020   Procedure: Posterior Instrumented Fusion Lumbar three to lumbar five;  Surgeon: Eustace Moore, MD;  Location: [facility];  Service: Neurosurgery;  Laterality: N/A;   LUMBAR LAMINECTOMY/DECOMPRESSION MICRODISCECTOMY  04/21/2015   Procedure: DECOMPRESSION LUMBAR THREE-LUMBAR FOUR;  Surgeon: Melina Schools, MD;  Location: [facility];  Service: Orthopedics;;   LUMBAR LAMINECTOMY/DECOMPRESSION MICRODISCECTOMY N/A 06/20/2017   Procedure: L4-5 decompression, L5-S1 left laminotomy/foraminotomy;  Surgeon:  Melina Schools, MD;  Location: [facility];  Service: Orthopedics;  Laterality: N/A;  3 hrs   PARATHYROIDECTOMY  2012   RADIOACTIVE SEED GUIDED EXCISIONAL BREAST BIOPSY Right 10/28/2021   Procedure: RADIOACTIVE SEED GUIDED EXCISIONAL RIGHT BREAST BIOPSY;  Surgeon: Stark Klein, MD;  Location: [facility];  Service: General;  Laterality: Right;   SACROILIAC JOINT FUSION Right 07/24/2018   Procedure: SACROILIAC JOINT FUSION;  Surgeon: Melina Schools, MD;  Location: [facility];  Service: Orthopedics;  Laterality: Right;  90 mins   SACROILIAC JOINT FUSION Left 01/12/2022   Procedure: SPINAL CORD STIMULATOR PLACEMENT, LEFT SACROILIAC FUSION;  Surgeon: Melina Schools, MD;  Location: [facility];  Service: Orthopedics;  Laterality: Left;   SHOULDER ARTHROSCOPY W/ ACROMIAL REPAIR Right    SPINAL CORD STIMULATOR INSERTION Left 01/12/2022   Procedure: LUMBAR SPINAL CORD STIMULATOR INSERTION;  Surgeon: Melina Schools, MD;  Location: [facility];  Service: Orthopedics;  Laterality: Left;   TONSILLECTOMY     TOTAL HIP ARTHROPLASTY Right 11/10/2020   Procedure: TOTAL HIP ARTHROPLASTY ANTERIOR APPROACH;  Surgeon: Gaynelle Arabian, MD;  Location: [facility];  Service: Orthopedics;  Laterality: Right;  187mn   FAMILY HISTORY Family History  Problem Relation Age of Onset   COPD Mother    Cancer Mother    Hypertension Mother    Hyperlipidemia Mother    Heart  disease Mother    Thyroid disease Mother    Thyroid disease Sister    Thyroid disease Daughter    SOCIAL HISTORY Social History   Tobacco Use   Smoking status: Former    Packs/day: 0.25    Years: 4.00    Total pack years: 1.00    Types: Cigarettes    Quit date: 1976    Years since quitting: 47.8   Smokeless tobacco: Never   Tobacco comments:    social smoker stop at age 71 Vaping Use   Vaping Use: Never used  Substance Use Topics   Alcohol use: Yes    Alcohol/week: 1.0 - 2.0 standard drink of alcohol    Types: 1 - 2 Glasses of wine per week    Comment: socially   Drug use: No       OPHTHALMIC EXAM:  Base Eye Exam     Visual Acuity (Snellen - Linear)       Right Left   Dist [place] 20/20 20/25 -1   Dist ph [place]  20/20 +1         Tonometry (Tonopen, 9:55 AM)       Right Left   Pressure 10 07         Pupils       Dark Light Shape React APD   Right 2 2 Round Minimal None   Left 2 2 Round Minimal None         Visual Fields (Counting fingers)       Left Right    Full Full         Extraocular Movement       Right Left    Full, Ortho Full, Ortho         Neuro/Psych     Oriented x3: Yes   Mood/Affect: Normal         Dilation     Both eyes: 1.0% Mydriacyl, 2.5% Phenylephrine @ 9:55 AM           Slit Lamp and Fundus Exam     Slit Lamp Exam       Right  Left   Lids/Lashes Dermatochalasis - upper lid, mild MGD Dermatochalasis - upper lid, mild MGD   Conjunctiva/Sclera White and quiet White and quiet   Cornea 1-2+ Punctate epithelial erosions, well healed cataract wound 2+ Punctate epithelial erosions, arcus, well healed cataract wound   Anterior Chamber deep and clear deep and clear   Iris Round and dilated Round and dilated   Lens PC IOL in good position PC IOL in good position   Anterior Vitreous Vitreous syneresis, Posterior vitreous detachment Vitreous syneresis, no pigment, Posterior vitreous detachment, operculum ST periphery          Fundus Exam       Right Left   Disc Pink and Sharp, mild tilt, focal PPP temporal Pink, +elevation/edema greatest superior quad, mild blurring of margins   C/D Ratio 0.2 0.3   Macula Flat, good foveal reflex, mild RPE mottling, No heme or edema Flat, good foveal reflex, mild RPE mottling, No heme or edema   Vessels attenuated, Tortuous attenuated, Tortuous   Periphery Attached, No heme Attached, pigmented lattice with VR tuft at 0430, operculated hole at 0130 ora -- good laser surrounding all lesions, No heme           IMAGING AND PROCEDURES  Imaging and Procedures for 04/18/2022  OCT, Retina - OU - Both Eyes       Right Eye Quality was good. Central Foveal Thickness: 261. Progression has been stable. Findings include normal foveal contour, no IRF, no SRF.   Left Eye Quality was good. Central Foveal Thickness: 267. Progression has worsened. Findings include normal foveal contour, no IRF, no SRF (+disc edema).   Notes *Images captured and stored on drive  Diagnosis / Impression:  NFP, no IRF/SRF OU OS: +disc edema  Clinical management:  See below  Abbreviations: NFP - Normal foveal profile. CME - cystoid macular edema. PED - pigment epithelial detachment. IRF - intraretinal fluid. SRF - subretinal fluid. EZ - ellipsoid zone. ERM - epiretinal membrane. ORA - outer retinal atrophy. ORT - outer retinal tubulation. SRHM - subretinal hyper-reflective material. IRHM - intraretinal hyper-reflective material            ASSESSMENT/PLAN:    ICD-10-CM   1. Retinal hole of left eye  H33.322 OCT, Retina - OU - Both Eyes    2. Lattice degeneration of left retina  H35.412     3. Edema of optic disc of left eye  H47.10 OCT, Retina - OU - Both Eyes    4. Vitreoretinal tuft of left eye  Q14.1     5. Pseudophakia, both eyes  Z96.1     6. Dry eyes  H04.123      1. Operculated retinal hole, OS   - operculated hole at 0130 ora - s/p indirect laser retinopexy OS  (10.24.23) -- good laser surrounding - completed Lotemax QID x7 days - no new RT/RD or lattice OS - f/u 4 weeks -- DFE, OCT  2,3. Lattice degeneration w/ VR tuft, left eye - pigmented lattice with VR tuft at 0430 - s/p laser retinopexy OS as above - f/u 4 weeks - DFE, OCT  4. Optic disc edema OS  - mild interval increase in disc edema / elevation OS on exam and OCT, greatest superior quad  - BCVA 20/20 OS  - pt reports vague punctate colored objects in vision -- denies flashes  - pt reports history of lower back / spine / nerve issues -- has a spinal cord stimulator  - discussed  optic disc edema changes on exam and OCT with pt  - discussed monitoring vs referral to Neuro-Ophthalmology for further evaluation and management  - pt requests referral to Neuro-Ophthalmology -- will make referral to [facility] or [facility] Neuro[facility]Op[facility] service  5. Pseudophakia OU  - s/p CE/IOL (Dr. Valetta Close, OD: 09.23.23, OS: 10.04.23)  - IOL in good position, doing well  - monitor  6. Dry eyes OU - recommend artificial tears and lubricating ointment as needed   Ophthalmic Meds Ordered this visit:  No orders of the defined types were placed in this encounter.    Return in about 4 weeks (around 05/16/2022) for f/u operculated hole OS, DFE, OCT.  There are no Patient Instructions on file for this visit.  Explained the diagnoses, plan, and follow up with the patient and they expressed understanding.  Patient expressed understanding of the importance of proper follow up care.   This document serves as a record of services personally performed by Gardiner Sleeper, MD, PhD. It was created on their behalf by Renaldo Reel, [place] an ophthalmic technician. The creation of this record is the provider's dictation and/or activities during the visit.    Electronically signed by:  Renaldo Reel, COT  10.24.23 12:49 PM  This document serves as a record of services personally performed by Gardiner Sleeper, MD, PhD. It was  created on their behalf by San Jetty. Owens Shark, OA an ophthalmic technician. The creation of this record is the provider's dictation and/or activities during the visit.    Electronically signed by: San Jetty. Marguerita Merles 11.07.2023 12:49 PM  Gardiner Sleeper, M.D., Ph.D. Diseases & Surgery of the Retina and Vitreous [facility]  I have reviewed the above documentation for accuracy and completeness, and I agree with the above. Gardiner Sleeper, M.D., Ph.D. 04/18/22 12:56 PM  Abbreviations: M myopia (nearsighted); A astigmatism; H hyperopia (farsighted); P presbyopia; Mrx spectacle prescription;  CTL contact lenses; OD right eye; OS left eye; OU both eyes  XT exotropia; ET esotropia; PEK punctate epithelial keratitis; PEE punctate epithelial erosions; DES dry eye syndrome; MGD meibomian gland dysfunction; ATs artificial tears; PFAT's preservative free artificial tears; [place] nuclear sclerotic cataract; PSC posterior subcapsular cataract; ERM epi-retinal membrane; PVD posterior vitreous detachment; RD retinal detachment; DM diabetes mellitus; DR diabetic retinopathy; NPDR non-proliferative diabetic retinopathy; PDR proliferative diabetic retinopathy; CSME clinically significant macular edema; DME diabetic macular edema; dbh dot blot hemorrhages; CWS cotton wool spot; POAG primary open angle glaucoma; C/D cup-to-disc ratio; HVF humphrey visual field; GVF goldmann visual field; OCT optical coherence tomography; IOP intraocular pressure; BRVO Branch retinal vein occlusion; CRVO central retinal vein occlusion; CRAO central retinal artery occlusion; BRAO branch retinal artery occlusion; RT retinal tear; SB scleral buckle; PPV pars plana vitrectomy; VH Vitreous hemorrhage; PRP panretinal laser photocoagulation; IVK intravitreal kenalog; VMT vitreomacular traction; MH Macular hole;  NVD neovascularization of the disc; NVE neovascularization elsewhere; AREDS age related eye disease study; ARMD age  related macular degeneration; POAG primary open angle glaucoma; EBMD epithelial/anterior basement membrane dystrophy; ACIOL anterior chamber intraocular lens; IOL intraocular lens; PCIOL posterior chamber intraocular lens; Phaco/IOL phacoemulsification with intraocular lens placement; [place] photorefractive keratectomy; LASIK laser assisted in situ keratomileusis; HTN hypertension; DM diabetes mellitus; COPD chronic obstructive pulmonary disease

## 2022-04-05 DIAGNOSIS — M5451 Vertebrogenic low back pain: Secondary | ICD-10-CM | POA: Diagnosis not present

## 2022-04-11 DIAGNOSIS — G894 Chronic pain syndrome: Secondary | ICD-10-CM | POA: Diagnosis not present

## 2022-04-11 DIAGNOSIS — Z79899 Other long term (current) drug therapy: Secondary | ICD-10-CM | POA: Diagnosis not present

## 2022-04-11 DIAGNOSIS — M5136 Other intervertebral disc degeneration, lumbar region: Secondary | ICD-10-CM | POA: Diagnosis not present

## 2022-04-11 DIAGNOSIS — Z5181 Encounter for therapeutic drug level monitoring: Secondary | ICD-10-CM | POA: Diagnosis not present

## 2022-04-11 DIAGNOSIS — M5416 Radiculopathy, lumbar region: Secondary | ICD-10-CM | POA: Diagnosis not present

## 2022-04-18 ENCOUNTER — Encounter (INDEPENDENT_AMBULATORY_CARE_PROVIDER_SITE_OTHER): Payer: Self-pay | Admitting: Ophthalmology

## 2022-04-18 ENCOUNTER — Ambulatory Visit (INDEPENDENT_AMBULATORY_CARE_PROVIDER_SITE_OTHER): Payer: Medicare Other | Admitting: Ophthalmology

## 2022-04-18 DIAGNOSIS — H35412 Lattice degeneration of retina, left eye: Secondary | ICD-10-CM | POA: Diagnosis not present

## 2022-04-18 DIAGNOSIS — Z961 Presence of intraocular lens: Secondary | ICD-10-CM | POA: Diagnosis not present

## 2022-04-18 DIAGNOSIS — H04123 Dry eye syndrome of bilateral lacrimal glands: Secondary | ICD-10-CM | POA: Diagnosis not present

## 2022-04-18 DIAGNOSIS — Q141 Congenital malformation of retina: Secondary | ICD-10-CM | POA: Diagnosis not present

## 2022-04-18 DIAGNOSIS — H33322 Round hole, left eye: Secondary | ICD-10-CM | POA: Diagnosis not present

## 2022-04-18 DIAGNOSIS — H4710 Unspecified papilledema: Secondary | ICD-10-CM | POA: Diagnosis not present

## 2022-04-20 ENCOUNTER — Emergency Department (HOSPITAL_COMMUNITY): Payer: Medicare Other

## 2022-04-20 ENCOUNTER — Other Ambulatory Visit: Payer: Self-pay

## 2022-04-20 ENCOUNTER — Emergency Department (HOSPITAL_COMMUNITY)
Admission: EM | Admit: 2022-04-20 | Discharge: 2022-04-21 | Disposition: A | Discharge status: Home / Self Care | Payer: Medicare Other | Attending: Emergency Medicine | Admitting: Emergency Medicine

## 2022-04-20 ENCOUNTER — Encounter (HOSPITAL_COMMUNITY): Payer: Self-pay

## 2022-04-20 DIAGNOSIS — H02846 Edema of left eye, unspecified eyelid: Secondary | ICD-10-CM | POA: Diagnosis present

## 2022-04-20 DIAGNOSIS — H4713 Papilledema associated with retinal disorder: Secondary | ICD-10-CM | POA: Diagnosis not present

## 2022-04-20 DIAGNOSIS — Z91010 Allergy to peanuts: Secondary | ICD-10-CM | POA: Diagnosis not present

## 2022-04-20 DIAGNOSIS — R519 Headache, unspecified: Secondary | ICD-10-CM | POA: Diagnosis not present

## 2022-04-20 DIAGNOSIS — H47093 Other disorders of optic nerve, not elsewhere classified, bilateral: Secondary | ICD-10-CM | POA: Diagnosis not present

## 2022-04-20 DIAGNOSIS — H4710 Unspecified papilledema: Secondary | ICD-10-CM | POA: Insufficient documentation

## 2022-04-20 LAB — CBC
HCT: 34.5 % — ABNORMAL LOW (ref 36.0–46.0)
Hemoglobin: 11.6 g/dL — ABNORMAL LOW (ref 12.0–15.0)
MCH: 33.3 pg (ref 26.0–34.0)
MCHC: 33.6 g/dL (ref 30.0–36.0)
MCV: 99.1 fL (ref 80.0–100.0)
Platelets: 173 10*3/uL (ref 150–400)
RBC: 3.48 MIL/uL — ABNORMAL LOW (ref 3.87–5.11)
RDW: 11.8 % (ref 11.5–15.5)
WBC: 4.1 10*3/uL (ref 4.0–10.5)
nRBC: 0 % (ref 0.0–0.2)

## 2022-04-20 LAB — BASIC METABOLIC PANEL
Anion gap: 7 (ref 5–15)
BUN: 16 mg/dL (ref 8–23)
CO2: 26 mmol/L (ref 22–32)
Calcium: 9.8 mg/dL (ref 8.9–10.3)
Chloride: 106 mmol/L (ref 98–111)
Creatinine, Ser: 0.77 mg/dL (ref 0.44–1.00)
GFR, Estimated: >60 mL/min (ref >60)
Glucose, Bld: 109 mg/dL — ABNORMAL HIGH (ref 70–99)
Potassium: 4.6 mmol/L (ref 3.5–5.1)
Sodium: 139 mmol/L (ref 135–145)

## 2022-04-20 LAB — C-REACTIVE PROTEIN: CRP: 1.1 mg/dL — ABNORMAL HIGH (ref <1.0)

## 2022-04-20 LAB — SEDIMENTATION RATE: Sed Rate: 10 mm/hr (ref 0–22)

## 2022-04-20 MED ORDER — GADOBUTROL 1 MMOL/ML IV SOLN
6.0000 mL | Freq: Once | INTRAVENOUS | Status: AC | PRN
  Administered 2022-04-20: 6 mL via INTRAVENOUS

## 2022-04-20 NOTE — ED Provider Triage Note (Signed)
Emergency Medicine Provider Triage Evaluation Note  Courtney [name] , a 71 y.o. female  was evaluated in triage.  Pt sent to ER by vision specialist with concern for papilledema.  Patient recently had cataracts laser surgery.  She saw the retinal specialist 2 days ago and they noted that she had papilledema of her left eye.  When she had her follow-up with a different vision specialist today, they noted that she had papilledema of the other eye, and wanted her to come to the ER for blood work and imaging.  Patient states that she does not have any headaches, very intermittently has some lightheadedness with position changes, but she states this is normal for her as her blood pressure is usually low.  Intermittently has some bright spots in her vision since the surgery, but her vision specialist have assured her that should go away.  No other symptoms.  Review of Systems  As above  Physical Exam  BP (!) 115/57 (BP Location: Right Arm)   Pulse 75   Temp 98.5 F (36.9 C) (Oral)   Resp 17   LMP  (LMP Unknown)   SpO2 97%  Gen:   Awake, no distress   Resp:  Normal effort  MSK:   Moves extremities without difficulty  Other:    Medical Decision Making  Medically screening exam initiated at 1:37 PM.  Appropriate orders placed.  Marceil Welp Geeslin was informed that the remainder of the evaluation will be completed by another provider, this initial triage assessment does not replace that evaluation, and the importance of remaining in the ED until their evaluation is complete.  Reviewed documentation from the vision specialist today with recommendations for blood work including ESR, CRP, and brain imaging.  We will start with CT imaging and lab eval.   Rosaire Cueto T, PA-C 04/20/22 1338

## 2022-04-20 NOTE — ED Triage Notes (Signed)
Pt was following up with with ophthalmology on 11/9 following a retinal tear they noted papilledema in the L eye. Today on f/u they noted papilledema in bilat eyes and pt was told to come to the ED. Pt denies eye pain, HA, or new focal neuro deficit.

## 2022-04-20 NOTE — ED Notes (Signed)
Per patient request, family at bedside updated on patient status in MRI.

## 2022-04-20 NOTE — ED Provider Notes (Signed)
[facility] EMERGENCY DEPARTMENT Provider Note   CSN: [id number] Arrival date & time: 04/20/22  1310     History  Chief Complaint  Patient presents with   Eye Problem    Courtney [name] is a 71 y.o. female.   Eye Problem    Patient has history of visual difficulty including retinal hole the left eye.  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  She was noted to have optic edema of the left eye.  Patient saw her ophthalmologist on the seventh.  They noted that she has optic disc edema changes on exam of the left eye.  Patient was given a referral to neuro-ophthalmology according to the notes by Dr. Coralyn Pear.  Patient states however she followed up with her other ophthalmologist today and they started noticing edema bilaterally and they recommended she come to the ED for further evaluation.  Patient states she has some vague punctate color objects in the left eye laterally in her field of vision that is intermittent.  She denies any blurred vision.  She is not having any speech changes.  No numbness or weakness.  No problems with her gait.  Home Medications Prior to Admission medications   Medication Sig Start Date End Date Taking? Authorizing Provider  acetaminophen (TYLENOL) 500 MG tablet Take 500 mg by mouth in the morning, at noon, and at bedtime.   Yes [provider]  Artificial Tear Solution (GENTEAL TEARS OP) Place 1 drop into both eyes daily as needed (dry eyes).   Yes [provider]  Ascorbic Acid (VITAMIN C) 1000 MG tablet Take 1,000 mg by mouth in the morning and at bedtime.   Yes [provider]  atorvastatin (LIPITOR) 10 MG tablet Take 10 mg by mouth daily.   Yes [provider]  CALCIUM CITRATE PO Take 3,000 mg by mouth at bedtime.   Yes [provider]  Carboxymethylcellul-Glycerin (REFRESH OPTIVE OP) Place 1 drop into both eyes daily as needed (dry eyes).   Yes [provider]   cholecalciferol (VITAMIN D3) 25 MCG (1000 UNIT) tablet Take 1,000 Units by mouth 2 (two) times daily.   Yes [provider]  clobetasol cream (TEMOVATE) 8.58 % Apply 1 Application topically 2 (two) times daily.   Yes [provider]  Coenzyme Q10 300 MG CAPS Take 300 mg by mouth daily.    Yes [provider]  DULoxetine (CYMBALTA) 30 MG capsule Take 30 mg by mouth daily after breakfast.  12/09/14  Yes [provider]  EPINEPHrine (EPIPEN JR) 0.15 MG/0.3ML injection Inject 0.15 mg into the muscle as needed for anaphylaxis.   Yes [provider]  estradiol (ESTRACE) 0.1 MG/GM vaginal cream Place 1 Applicatorful vaginally at bedtime.   Yes [provider]  famotidine (PEPCID) 20 MG tablet Take 20 mg by mouth at bedtime.   Yes [provider]  FERROUS BISGLYCINATE CHELATE PO Take 25 mg by mouth daily.    Yes [provider]  ferrous sulfate 325 (65 FE) MG tablet Take 325 mg by mouth daily.   Yes [provider]  fluticasone (FLONASE) 50 MCG/ACT nasal spray Place 1 spray into both nostrils at bedtime.    Yes [provider]  GEMTESA 75 MG TABS Take 1 tablet by mouth daily. 03/08/22  Yes [provider]  levothyroxine (SYNTHROID, LEVOTHROID) 25 MCG tablet Take 1 1/2 tablets daily Patient taking differently: Take 37.5 mcg by mouth daily before breakfast. Take  1 1/2 tablets daily 11/12/17  Yes Elayne Snare, MD  loratadine (CLARITIN) 10 MG tablet Take 10 mg by mouth daily as needed (for seasonal allergies (Spring/Summer)).    Yes [provider]  meloxicam (MOBIC) 7.5 MG tablet Take 7.5 mg by mouth daily.   Yes [provider]  methocarbamol (ROBAXIN) 500 MG tablet Take 500 mg by mouth 2 (two) times daily. 04/06/22  Yes [provider]  naproxen (NAPROSYN) 250 MG tablet Take 250 mg by mouth 2 (two) times daily with a meal.   Yes [provider]  Oxycodone HCl 10 MG TABS Take 10  mg by mouth 4 (four) times daily as needed. 04/11/22  Yes [provider]  polyethylene glycol (MIRALAX / GLYCOLAX) 17 g packet Take 17 g by mouth daily.   Yes [provider]  psyllium (REGULOID) 0.52 g capsule Take 0.52 g by mouth in the morning and at bedtime.   Yes [provider]  Tart Cherry 1200 MG CAPS Take 1,200 mg by mouth daily.   Yes [provider]  traZODone (DESYREL) 100 MG tablet Take 50 mg by mouth at bedtime.   Yes [provider]  PRESCRIPTION MEDICATION Tyrvaya -- Nasal Spray -- used for dry eyes    [provider]      Allergies    Fish allergy, Peanut butter flavor, Other, and Shellfish allergy    Review of Systems   Review of Systems  Physical Exam Updated Vital Signs BP 131/63   Pulse 70   Temp 98 F (36.7 C)   Resp 18   Ht 1.626 m ('5\' 4"'$ )   Wt 61.2 kg   LMP  (LMP Unknown)   SpO2 99%   BMI 23.17 kg/m  Physical Exam Vitals and nursing note reviewed.  Constitutional:      General: She is not in acute distress.    Appearance: She is well-developed.  HENT:     Head: Normocephalic and atraumatic.     Right Ear: External ear normal.     Left Ear: External ear normal.  Eyes:     General: No visual field deficit or scleral icterus.       Right eye: No discharge.        Left eye: No discharge.     Conjunctiva/sclera: Conjunctivae normal.  Neck:     Trachea: No tracheal deviation.  Cardiovascular:     Rate and Rhythm: Normal rate.  Pulmonary:     Effort: Pulmonary effort is normal. No respiratory distress.     Breath sounds: No stridor.  Abdominal:     General: There is no distension.  Musculoskeletal:        General: No swelling or deformity.     Cervical back: Neck supple.  Skin:    General: Skin is warm and dry.     Findings: No rash.  Neurological:     General: No focal deficit present.     Mental Status: She is alert and oriented to person, place, and time.     Cranial Nerves: No  cranial nerve deficit (no gross deficits), dysarthria or facial asymmetry.     Sensory: No sensory deficit.     Motor: No weakness.     Gait: Gait normal.     ED Results / Procedures / Treatments   Labs (all labs ordered are listed, but only abnormal results are displayed) Labs Reviewed  CBC - Abnormal; Notable for the following components:  Result Value   RBC 3.48 (*)    Hemoglobin 11.6 (*)    HCT 34.5 (*)    All other components within normal limits  BASIC METABOLIC PANEL - Abnormal; Notable for the following components:   Glucose, Bld 109 (*)    All other components within normal limits  C-REACTIVE PROTEIN - Abnormal; Notable for the following components:   CRP 1.1 (*)    All other components within normal limits  SEDIMENTATION RATE    EKG None  Radiology MR Brain W and Wo Contrast  Result Date: 04/20/2022 CLINICAL DATA:  Headache, papilledema; recent cataract surgery EXAM: MRI HEAD AND ORBITS WITHOUT AND WITH CONTRAST TECHNIQUE: Multiplanar, multiecho pulse sequences of the brain and surrounding structures were obtained without and with intravenous contrast. Multiplanar, multiecho pulse sequences of the orbits and surrounding structures were obtained including fat saturation techniques, before and after intravenous contrast administration. CONTRAST:  57m GADAVIST GADOBUTROL 1 MMOL/ML IV SOLN COMPARISON:  04/05/2016 MRI head; no prior MRA FINDINGS: MRI HEAD FINDINGS Brain: No restricted diffusion to suggest acute or subacute infarct. No abnormal parenchymal or meningeal enhancement. No acute hemorrhage, mass, mass effect, or midline shift. No hydrocephalus or extra-axial collection. No hemosiderin deposition to suggest remote hemorrhage. Cerebral volume is normal for age. Partial empty sella. Normal craniocervical junction. Vascular: Normal arterial flow voids. Normal arterial and venous enhancement. Skull and upper cervical spine: Normal marrow signal. Redemonstrated right  frontal scalp exostosis, which appears unchanged compared to 2017. Other: The mastoids are well aerated. MRI ORBITS FINDINGS Orbits: Possible mild flattening of the posterior aspect of the left globe (series 18, image 8). Prominent fluid in the left optic nerve sheath anteriorly (series 18, image 8 and series 23, image 14). Mildly tortuous optic nerves bilaterally. No traumatic or inflammatory finding. Globes, optic nerves, orbital fat, extraocular muscles, vascular structures, and lacrimal glands are otherwise normal. Visualized sinuses: Clear paranasal sinuses. Soft tissues: Negative. IMPRESSION: 1. Possible mild flattening of the posterior aspect of the left globe and prominent fluid in the dural sheath of the bilateral optic nerve, particularly anteriorly, which can be seen with papilledema. 2. No acute intracranial process. 3. Partial empty sella and optic nerve tortuosity, which are nonspecific findings but can be seen in the setting of idiopathic intracranial hypertension. Electronically Signed   By: AMerilyn BabaM.D.   On: 04/20/2022 22:46   MR ORBITS W WO CONTRAST  Result Date: 04/20/2022 CLINICAL DATA:  Headache, papilledema; recent cataract surgery EXAM: MRI HEAD AND ORBITS WITHOUT AND WITH CONTRAST TECHNIQUE: Multiplanar, multiecho pulse sequences of the brain and surrounding structures were obtained without and with intravenous contrast. Multiplanar, multiecho pulse sequences of the orbits and surrounding structures were obtained including fat saturation techniques, before and after intravenous contrast administration. CONTRAST:  69mGADAVIST GADOBUTROL 1 MMOL/ML IV SOLN COMPARISON:  04/05/2016 MRI head; no prior MRA FINDINGS: MRI HEAD FINDINGS Brain: No restricted diffusion to suggest acute or subacute infarct. No abnormal parenchymal or meningeal enhancement. No acute hemorrhage, mass, mass effect, or midline shift. No hydrocephalus or extra-axial collection. No hemosiderin deposition to suggest  remote hemorrhage. Cerebral volume is normal for age. Partial empty sella. Normal craniocervical junction. Vascular: Normal arterial flow voids. Normal arterial and venous enhancement. Skull and upper cervical spine: Normal marrow signal. Redemonstrated right frontal scalp exostosis, which appears unchanged compared to 2017. Other: The mastoids are well aerated. MRI ORBITS FINDINGS Orbits: Possible mild flattening of the posterior aspect of the left globe (series 18, image 8). Prominent  fluid in the left optic nerve sheath anteriorly (series 18, image 8 and series 23, image 14). Mildly tortuous optic nerves bilaterally. No traumatic or inflammatory finding. Globes, optic nerves, orbital fat, extraocular muscles, vascular structures, and lacrimal glands are otherwise normal. Visualized sinuses: Clear paranasal sinuses. Soft tissues: Negative. IMPRESSION: 1. Possible mild flattening of the posterior aspect of the left globe and prominent fluid in the dural sheath of the bilateral optic nerve, particularly anteriorly, which can be seen with papilledema. 2. No acute intracranial process. 3. Partial empty sella and optic nerve tortuosity, which are nonspecific findings but can be seen in the setting of idiopathic intracranial hypertension. Electronically Signed   By: Merilyn Baba M.D.   On: 04/20/2022 22:46   CT Head Wo Contrast  Result Date: 04/20/2022 CLINICAL DATA:  Headache.  Papilledema. EXAM: CT HEAD WITHOUT CONTRAST TECHNIQUE: Contiguous axial images were obtained from the base of the skull through the vertex without intravenous contrast. RADIATION DOSE REDUCTION: This exam was performed according to the departmental dose-optimization program which includes automated exposure control, adjustment of the mA and/or kV according to patient size and/or use of iterative reconstruction technique. COMPARISON:  Brain MRI 04/05/2016 FINDINGS: Brain: No evidence of acute infarction, hemorrhage, hydrocephalus,  extra-axial collection or mass lesion/mass effect. Incidentally noted is a partially empty sella. Vascular: No hyperdense vessel or unexpected calcification. Arachnoid granulations in the bilateral transverse sinuses. Skull: Normal. Negative for fracture or focal lesion. Sinuses/Orbits: Bilateral orbits are only partially imaged. No acute abnormality is visualized in the imaged portions of the bilateral orbits. There is mild mucosal thickening2 in the anterior ethmoid air cells on the right. Paranasal sinuses are otherwise normal in appearance. Other: None IMPRESSION: 1. No acute intracranial abnormality. No definite CT finding to explain patient's vision change or papilledema noted on physical exam. 2. Incidentally noted is a partially empty sella. This is a nonspecific finding but can be seen in the setting of idiopathic intracranial hypertension. Electronically Signed   By: Marin Roberts M.D.   On: 04/20/2022 14:46    Procedures Procedures    Medications Ordered in ED Medications  gadobutrol (GADAVIST) 1 MMOL/ML injection 6 mL (6 mLs Intravenous Contrast Given 04/20/22 2140)    ED Course/ Medical Decision Making/ A&P Clinical Course as of 04/20/22 2351  Thu Apr 20, 2022  1800 Discussed with Dr Lorrin Goodell.  Recommends MRI with and without brain and orbits.  Also recommends LP to evaluate opening pressure.  Reviewed with pt.  Pt states she has a spinal stimulator in place.  LP will need to be done under fluoro [JK]  1820 Sedimentation rate Sedimentation rate normal.  CRP slightly elevated.  CBC metabolic panel otherwise unremarkable [JK]  2316 MRI does not show any acute findings other than mild flattening of the left globe and fluid bilateral dural sheath of the optic nerves [JK]  2317 Case reviewed with Dr. Carolin Sicks.  She will evaluate the patient in the ED [JK]    Clinical Course User Index [JK] Dorie Rank, MD                           Medical Decision Making Differential diagnosis  includes idiopathic intracranial hypertension,  brain tumor  Problems Addressed: Papilledema: acute illness or injury  Amount and/or Complexity of Data Reviewed Labs: ordered. Decision-making details documented in ED Course. Radiology: ordered and independent interpretation performed. Discussion of management or test interpretation with external provider(s): Case reviewed with neuro hospitalist  Risk Prescription drug management.   Patient presented to the ED for evaluation of newly diagnosed papilledema.  Patient fortunately not having any complaints of headache.  No visual changes noted.  Patient sent from her ophthalmologist office for neurology evaluation.  Fortunately no evidence of brain tumor or mass on MRI.  Patient will require an LP for further evaluation.  Unfortunately she has a spinal stimulator and will require fluoroscopy guided LP.  This is not available in the evening.  Neurology will see pt in the ED.  Anticipate holding in the ED so she can have her fluoroscopy guided LP in the morning.  Care turned over to oncoming MD at shift change        Final Clinical Impression(s) / ED Diagnoses Final diagnoses:  Papilledema    Rx / DC Orders ED Discharge Orders     None         Dorie Rank, MD 04/20/22 2351

## 2022-04-20 NOTE — ED Notes (Signed)
Patient transported to MRI 

## 2022-04-20 NOTE — ED Provider Notes (Signed)
Care assumed from Dr. Tomi Bamberger, patient with papilledema per ophthalmology. CT and MRI are unremarkable. Plan is for fluoroscopically guided LP (has a spinal stimulator, cannot get blind LP).  Case is signed out to Dr. Ronnald Nian.   Delora Fuel, MD 56/94/37 [phone]

## 2022-04-20 NOTE — ED Notes (Signed)
This RN rounded on pt in lobby, updated on plan of care. Appears in NAD.

## 2022-04-21 ENCOUNTER — Emergency Department (HOSPITAL_COMMUNITY): Payer: Medicare Other

## 2022-04-21 DIAGNOSIS — H4710 Unspecified papilledema: Secondary | ICD-10-CM | POA: Diagnosis not present

## 2022-04-21 LAB — CSF CELL COUNT WITH DIFFERENTIAL
RBC Count, CSF: 332 /mm3 — ABNORMAL HIGH
RBC Count, CSF: 333 /mm3 — ABNORMAL HIGH
Tube #: 1
Tube #: 4
WBC, CSF: 1 /mm3 (ref 0–5)
WBC, CSF: 4 /mm3 (ref 0–5)

## 2022-04-21 LAB — MENINGITIS/ENCEPHALITIS PANEL (CSF)

## 2022-04-21 LAB — HEPATIC FUNCTION PANEL
ALT: 24 U/L (ref 0–44)
AST: 26 U/L (ref 15–41)
Albumin: 3.9 g/dL (ref 3.5–5.0)
Alkaline Phosphatase: 50 U/L (ref 38–126)
Bilirubin, Direct: <0.1 mg/dL (ref 0.0–0.2)
Total Bilirubin: 0.4 mg/dL (ref 0.3–1.2)
Total Protein: 5.8 g/dL — ABNORMAL LOW (ref 6.5–8.1)

## 2022-04-21 LAB — PROTEIN AND GLUCOSE, CSF
Glucose, CSF: 58 mg/dL (ref 40–70)
Total  Protein, CSF: 49 mg/dL — ABNORMAL HIGH (ref 15–45)

## 2022-04-21 LAB — HIV ANTIBODY (ROUTINE TESTING W REFLEX): HIV Screen 4th Generation wRfx: NONREACTIVE

## 2022-04-21 MED ORDER — LIDOCAINE HCL (PF) 1 % IJ SOLN
5.0000 mL | Freq: Once | INTRAMUSCULAR | Status: AC
  Administered 2022-04-21: 5 mL via INTRADERMAL

## 2022-04-21 NOTE — Progress Notes (Signed)
Please see attached ophthalmology funduscopic images (showing papilledema) and visual fields (normal)

## 2022-04-21 NOTE — ED Notes (Signed)
Pt resting quietly at this time

## 2022-04-21 NOTE — Discharge Instructions (Signed)
Please stop taking vitamin A.  Follow-up with neurology and your eye doctor.

## 2022-04-21 NOTE — ED Notes (Signed)
Patient transported to [facility].

## 2022-04-21 NOTE — ED Provider Notes (Signed)
Patient awaiting LP to further evaluate for papilledema cause.  Neurology suspects may be from recent surgery/medications that she is on.  She is overall asymptomatic.  No vision changes or headaches.  No fever or chills.  She has a spinal stimulator needs fluoroscopy guided LP.  CSF opening pressure was 22.  CSL cell count and differential reassuring.  Only 4 white blood cells.  Meningitis panel m unremarkable.  Gram stain showed no organisms and no white blood cells.  Overall patient cleared by neurology.  We will have her follow-up with her eye doctor and neurology outpatient.  She has been recommended to stop taking vitamin A.  This chart was dictated using voice recognition software.  Despite best efforts to proofread,  errors can occur which can change the documentation meaning.    Lennice Sites, DO 04/21/22 1439

## 2022-04-21 NOTE — Consult Note (Signed)
Neurology Consultation Reason for Consult: Courtney [name]  Requesting Physician: Dorie Rank  CC: Asymptomatic bilateral papilledema   History is obtained from: Patient and chart review   HPI: Courtney [name] is a 71 y.o. female with a past medical history significant for hyperlipidemia, obstructive sleep apnea managed by taping, remote migraine headaches, lumbar fusion and spinal cord stimulator (placed August 2023)  She recently had cataract surgery first in her right eye on October 4 followed by her left eye 2 weeks later.  She did note some changes in her peripheral vision after the cataract surgery but these were attributed to adjustment to the lens and had improved over time.  She was noted to have a retinal tear that required laser repair in the left eye 2.5 weeks ago and in that setting had some photopsias in the left eye starting in the left lower quadrant and moving up towards the upper right quadrant sometimes triggered by bending happening a few times a day but again improving lately and attributed to normal side effect of laser repair.  However on follow-up with her ophthalmologist she was noted to have left eye papilledema on 11/7 and subsequently 2 days later on 11/9 as she had bilateral papilledema for which she was referred to the emergency room for further evaluation  Notably she reports she has been taking vitamin A for quite some time, unclear indication.  She does not use any retinoids, she does use some vaginal Estrace cream in small amounts as needed, denies any recent antibiotic use but in the setting of her surgeries has been using steroid eyedrops on a tapering schedule which she has completed.  She denies any headache, pulsatile tinnitus, visual field deficits (and formal visual fields checked at ophthalmology visit were fully intact) Denies any family history or personal history of autoimmune disease (perhaps with some vague complaints in daughter, unclear whether functional  or autoimmune)  ROS: All other review of systems was negative except as noted in the HPI.   Past Medical History:  Diagnosis Date   ADHD    ADD   Allergic to pets    pet dander   Allergies    Anemia    take iron   Anxiety    Arthritis    hands, hip, shoulders back,   Asthma    as a child   Bicuspid aortic valve    bicuspid AV with mild AS, ascending aorta 3.40 cm 6/96/78 echo   Complication of anesthesia    slow to wake up and lingers for a long time   Concussion    Depression    Family history of adverse reaction to anesthesia    mom had skin reaction to anesthesia but cant remember what it was or exactly what happened   Fatigue    Fibromyalgia    GERD (gastroesophageal reflux disease)    Hyperlipidemia    Hypothyroidism    Irregular periods    Joint pain    Joint stiffness    Lower back pain    Menopause    Migraines    no longer   Mitral regurgitation    Numbness and tingling    Pollen allergy    Presence of pessary    Ringing in ears    Sleep apnea    does not wear CPAP tapes mouth at night   Snoring    Past Surgical History:  Procedure Laterality Date   ABDOMINAL EXPOSURE N/A 04/12/2020   Procedure: ABDOMINAL EXPOSURE;  Surgeon:  [name], MD;  Location: [place];  Service: Vascular;  Laterality: N/A;   ABDOMINOPLASTY     ANTERIOR CERVICAL DECOMP/DISCECTOMY FUSION N/A 06/28/2016   Procedure: ACDF C5-7 ANTERIOR CERVICAL DECOMPRESSION/DISCECTOMY FUSION 2 LEVELS;  Surgeon: Melina Schools, MD;  Location: [place];  Service: Orthopedics;  Laterality: N/A;  Requests 3 hrs   ANTERIOR LUMBAR FUSION N/A 04/12/2020   Procedure: Anterior Lumbar Interbody Fusion - Lumbar four-Lumbar five , posterior instrumented fusion Lumbar four-five lumbar three -four;  Surgeon: Eustace Moore, MD;  Location: [place];  Service: Neurosurgery;  Laterality: N/A;   BREAST BIOPSY Right 09/19/2021   CARPAL TUNNEL RELEASE Bilateral 1996   COLONOSCOPY     DENTAL SURGERY     HERNIA  REPAIR     times 2   LAMINECTOMY WITH POSTERIOR LATERAL ARTHRODESIS LEVEL 1 N/A 04/12/2020   Procedure: Posterior Instrumented Fusion Lumbar three to lumbar five;  Surgeon: Eustace Moore, MD;  Location: [place];  Service: Neurosurgery;  Laterality: N/A;   LUMBAR LAMINECTOMY/DECOMPRESSION MICRODISCECTOMY  04/21/2015   Procedure: DECOMPRESSION LUMBAR THREE-LUMBAR FOUR;  Surgeon: Melina Schools, MD;  Location: [place];  Service: Orthopedics;;   LUMBAR LAMINECTOMY/DECOMPRESSION MICRODISCECTOMY N/A 06/20/2017   Procedure: L4-5 decompression, L5-S1 left laminotomy/foraminotomy;  Surgeon: Melina Schools, MD;  Location: [place];  Service: Orthopedics;  Laterality: N/A;  3 hrs   PARATHYROIDECTOMY  2012   RADIOACTIVE SEED GUIDED EXCISIONAL BREAST BIOPSY Right 10/28/2021   Procedure: RADIOACTIVE SEED GUIDED EXCISIONAL RIGHT BREAST BIOPSY;  Surgeon: Stark Klein, MD;  Location: [place];  Service: General;  Laterality: Right;   SACROILIAC JOINT FUSION Right 07/24/2018   Procedure: SACROILIAC JOINT FUSION;  Surgeon: Melina Schools, MD;  Location: [place];  Service: Orthopedics;  Laterality: Right;  90 mins   SACROILIAC JOINT FUSION Left 01/12/2022   Procedure: SPINAL CORD STIMULATOR PLACEMENT, LEFT SACROILIAC FUSION;  Surgeon: Melina Schools, MD;  Location: [place];  Service: Orthopedics;  Laterality: Left;   SHOULDER ARTHROSCOPY W/ ACROMIAL REPAIR Right    SPINAL CORD STIMULATOR INSERTION Left 01/12/2022   Procedure: LUMBAR SPINAL CORD STIMULATOR INSERTION;  Surgeon: Melina Schools, MD;  Location: [place];  Service: Orthopedics;  Laterality: Left;   TONSILLECTOMY     TOTAL HIP ARTHROPLASTY Right 11/10/2020   Procedure: TOTAL HIP ARTHROPLASTY ANTERIOR APPROACH;  Surgeon: Gaynelle Arabian, MD;  Location: [facility];  Service: Orthopedics;  Laterality: Right;  120mn   Current Outpatient Medications  Medication Instructions   acetaminophen (TYLENOL) 500 mg, Oral, 3 times daily   Artificial Tear Solution  (GENTEAL TEARS OP) 1 drop, Both Eyes, Daily PRN   atorvastatin (LIPITOR) 10 mg, Oral, Daily   CALCIUM CITRATE PO 3,000 mg, Oral, Daily at bedtime   Carboxymethylcellul-Glycerin (REFRESH OPTIVE OP) 1 drop, Both Eyes, Daily PRN   cholecalciferol (VITAMIN D3) 1,000 Units, Oral, 2 times daily   clobetasol cream (TEMOVATE) 04.09% 1 Application, Topical, 2 times daily   Coenzyme Q10 300 mg, Oral, Daily   DULoxetine (CYMBALTA) 30 mg, Oral, Daily after breakfast   EPINEPHrine (EPIPEN JR) 0.15 mg, Intramuscular, As needed   estradiol (ESTRACE) 0.1 MG/GM vaginal cream 1 Applicatorful, Vaginal, Daily at bedtime   famotidine (PEPCID) 20 mg, Oral, Daily at bedtime   FERROUS BISGLYCINATE CHELATE PO 25 mg, Oral, Daily   ferrous sulfate 325 mg, Oral, Daily   fluticasone (FLONASE) 50 MCG/ACT nasal spray 1 spray, Each Nare, Daily at bedtime   GEMTESA 75 MG TABS 1 tablet, Oral, Daily   levothyroxine (  SYNTHROID, LEVOTHROID) 25 MCG tablet Take 1 1/2 tablets daily   loratadine (CLARITIN) 10 mg, Oral, Daily PRN   meloxicam (MOBIC) 7.5 mg, Oral, Daily   methocarbamol (ROBAXIN) 500 mg, Oral, 2 times daily   naproxen (NAPROSYN) 250 mg, Oral, 2 times daily with meals   Oxycodone HCl 10 mg, Oral, 4 times daily PRN   polyethylene glycol (MIRALAX / GLYCOLAX) 17 g, Oral, Daily   PRESCRIPTION MEDICATION [place]vaya -- Nasal Spray -- used for dry eyes   psyllium (REGULOID) 0.52 g, Oral, 2 times daily   Tart Cherry 1,200 mg, Oral, Daily   traZODone (DESYREL) 50 mg, Oral, Daily at bedtime   vitamin C 1,000 mg, Oral, 2 times daily     Family History  Problem Relation Age of Onset   COPD Mother    Cancer Mother    Hypertension Mother    Hyperlipidemia Mother    Heart disease Mother    Thyroid disease Mother    Thyroid disease Sister    Thyroid disease Daughter     Social History:  reports that she quit smoking about 47 years ago. Her smoking use included cigarettes. She has a 1.00 pack-year smoking history. She has  never used smokeless tobacco. She reports current alcohol use of about 1.0 - 2.0 standard drink of alcohol per week. She reports that she does not use drugs.   Exam: Current vital signs: BP (!) 120/53 (BP Location: Right Arm)   Pulse 61   Temp 98.3 F (36.8 C) (Oral)   Resp 17   Ht '5\' 4"'$  (1.626 m)   Wt 61.2 kg   LMP  (LMP Unknown)   SpO2 98%   BMI 23.17 kg/m  Vital signs in last 24 hours: Temp:  [98 F (36.7 C)-98.5 F (36.9 C)] 98.3 F (36.8 C) (11/10 0438) Pulse Rate:  [61-75] 61 (11/10 0438) Resp:  [14-18] 17 (11/10 0438) BP: (115-142)/(51-65) 120/53 (11/10 0438) SpO2:  [96 %-100 %] 98 % (11/10 0438) Weight:  [61.2 kg] 61.2 kg (11/09 1351)   Physical Exam  Constitutional: Appears well-developed and well-nourished.  Psych: Affect appropriate to situation, calm and cooperative, appropriately concerned about her vision Eyes: No scleral injection HENT: No oropharyngeal obstruction.  MSK: no joint deformities.  Cardiovascular: Normal rate and regular rhythm. Perfusing extremities well Respiratory: Effort normal, non-labored breathing GI: Soft.  No distension. There is no tenderness.  Skin: Warm dry and intact visible skin  Neuro: Mental Status: Patient is awake, alert, oriented to person, place, month, year, and situation. Patient is able to give a clear and coherent history. No signs of aphasia or neglect Cranial Nerves: II: Visual Fields are full. Pupils are equal, round, and reactive to light.  On funduscopic examination she does have bilateral papilledema, similar to what is pictured (see progress note with attached images from myself today) III,IV, VI: EOMI without ptosis or diploplia.  V: Facial sensation is symmetric to light touch VII: Facial movement is symmetric.  VIII: hearing is intact to voice X: Uvula elevates symmetrically XI: Shoulder shrug is symmetric. XII: tongue is midline without atrophy or fasciculations.  Motor: Tone is normal. Bulk is  normal. 5/5 strength was present in all four extremities, except for right hip flexor weakness 4+/5 Sensory: Sensation is symmetric to light touch and temperature in the arms and legs, except for stable sensory loss in the right leg from the groin to the toes more prominent on the inner leg from prior surgeries Deep Tendon Reflexes: 3+ and  symmetric in the brachioradialis and patellae.  Cerebellar: FNF and HKS are intact bilaterally Gait:  Normal casual gait   I have reviewed labs in epic and the results pertinent to this consultation are:  Basic Metabolic Panel: Recent Labs  Lab 04/20/22 1344  NA 139  K 4.6  CL 106  CO2 26  GLUCOSE 109*  BUN 16  CREATININE 0.77  CALCIUM 9.8    CBC: Recent Labs  Lab 04/20/22 1344  WBC 4.1  HGB 11.6*  HCT 34.5*  MCV 99.1  PLT 173    Coagulation Studies: No results for input(s): "LABPROT", "INR" in the last 72 hours.    I have reviewed the images obtained:  MRI brain and MRI orbits with without contrast personally reviewed, agree with radiology 1. Possible mild flattening of the posterior aspect of the left globe and prominent fluid in the dural sheath of the bilateral optic nerve, particularly anteriorly, which can be seen with papilledema. 2. No acute intracranial process. 3. Partial empty sella and optic nerve tortuosity, which are nonspecific findings but can be seen in the setting of idiopathic intracranial hypertension.  Assessment: This is a 71 year old woman presenting with asymptomatic bilateral papilledema.  Typically idiopathic intracranial hypertension is a disease entity affecting young women of childbearing age.  However it is also associated both with vitamin A toxicity and steroid withdrawal.  Have advised the patient to discontinue vitamin A supplementation at this time.  While infectious/inflammatory etiologies are possible, the remainder of her ophthalmological evaluation being reassuring as well as no objective  loss of visual field cuts on full ophthalmological testing as well as no subjective symptoms make me hesitate to start treatment empirically.  I will send a broad work-up as below, and have counseled the patient with strict return precautions if she is able to be discharged today (if CSF is noninflammatory)  Recommendations: -Stop vitamin A -Fluoroscopy guided lumbar puncture with opening pressure, protein, glucose, cell counts in tubes 1 and 4, NMO, oligoclonal bands, IgG index, meningitis/encephalitis panel, VDRL -Serum MOG, NMO, vitamin A levels, RPR, HIV -If opening pressure is elevated and CSF is not inflammatory, patient should be started on Diamox 500 mg twice daily  -Neurology will follow-up CSF protein, glucose, cell counts and opening pressure; the remainder of the testing can be followed up on an outpatient basis if the patient remains otherwise stable and the preliminary studies are reassuring against inflammation (ambulatory referral to neurology will need to be placed) -Close outpatient follow-up with ophthalmology on Monday if discharged today  Lesleigh Noe MD-PhD [facility] [phone] Available 7 PM to 7 AM, outside of these hours please call Neurologist on call as listed on Amion.

## 2022-04-22 LAB — RPR: RPR Ser Ql: NONREACTIVE

## 2022-04-22 LAB — VDRL, CSF: VDRL Quant, CSF: NONREACTIVE

## 2022-04-24 DIAGNOSIS — H4713 Papilledema associated with retinal disorder: Secondary | ICD-10-CM | POA: Diagnosis not present

## 2022-04-24 LAB — CSF CULTURE W GRAM STAIN: Culture: NO GROWTH

## 2022-04-24 LAB — IGG CSF INDEX
Albumin CSF-mCnc: 33 mg/dL (ref 8–37)
Albumin: 4.4 g/dL (ref 3.9–4.9)
CSF IgG Index: 0.5 (ref 0.0–0.7)
IgG (Immunoglobin G), Serum: 485 mg/dL — ABNORMAL LOW (ref 586–1602)
IgG, CSF: 1.9 mg/dL (ref 0.0–6.7)
IgG/Alb Ratio, CSF: 0.06 (ref 0.00–0.25)

## 2022-04-24 LAB — NEUROMYELITIS OPTICA AUTOAB, IGG: NMO-IgG: <1.5 U/mL (ref 0.0–3.0)

## 2022-04-25 DIAGNOSIS — H9313 Tinnitus, bilateral: Secondary | ICD-10-CM | POA: Diagnosis not present

## 2022-04-25 DIAGNOSIS — H9193 Unspecified hearing loss, bilateral: Secondary | ICD-10-CM | POA: Diagnosis not present

## 2022-04-27 DIAGNOSIS — H468 Other optic neuritis: Secondary | ICD-10-CM | POA: Diagnosis not present

## 2022-04-27 DIAGNOSIS — H4713 Papilledema associated with retinal disorder: Secondary | ICD-10-CM | POA: Diagnosis not present

## 2022-04-27 LAB — VITAMIN A: Vitamin A (Retinoic Acid): 55.5 ug/dL (ref 22.0–69.5)

## 2022-04-27 LAB — OLIGOCLONAL BANDS, CSF + SERM

## 2022-05-01 DIAGNOSIS — H539 Unspecified visual disturbance: Secondary | ICD-10-CM | POA: Diagnosis not present

## 2022-05-01 DIAGNOSIS — H468 Other optic neuritis: Secondary | ICD-10-CM | POA: Diagnosis not present

## 2022-05-03 ENCOUNTER — Ambulatory Visit: Payer: BC Managed Care – PPO | Admitting: Neurology

## 2022-05-08 DIAGNOSIS — H468 Other optic neuritis: Secondary | ICD-10-CM | POA: Diagnosis not present

## 2022-05-08 NOTE — Progress Notes (Signed)
[facility] Note  05/16/2022     CHIEF COMPLAINT Patient presents for Retina Follow Up   HISTORY OF PRESENT ILLNESS: Courtney [name] is a 71 y.o. female who presents to the clinic today for:   HPI     Retina Follow Up   Patient presents with  Other.  In left eye.  Severity is moderate.  Duration of 4 weeks.  Since onset it is stable.  I, the attending physician,  performed the HPI with the patient and updated documentation appropriately.        Comments   Pt here for 4 wk ret f/u for ret hole OS. PT states she was seen @ [facility] on 05/12/22. Pt was started on Diamox 563m QHS, began on 05/12/22. States her spine feels better since beginning the medication.       Last edited by SKingsley Spittle COT on 05/16/2022  9:58 AM.    [facility], differential includes papilledema and NAION, she has been put on 508mdiamox once a day for 90 days, pts [place] appt at [facility] 01[place]26.24, she states there is no change in vision  Referring physician: BoJola SchmidtMD [address] [place] [id number] INFORMATION:   Selected notes from the MEDICAL RECORD NUMBER Referred by Dr. BoValetta Closeor concern of operculated tear OS LEE:  Ocular Hx- PMH-    CURRENT MEDICATIONS: Current Outpatient Medications (Ophthalmic Drugs)  Medication Sig   Artificial Tear Solution (GENTEAL TEARS OP) Place 1 drop into both eyes daily as needed (dry eyes).   Carboxymethylcellul-Glycerin (REFRESH OPTIVE OP) Place 1 drop into both eyes daily as needed (dry eyes).   No current facility-administered medications for this visit. (Ophthalmic Drugs)   Current Outpatient Medications (Other)  Medication Sig   acetaminophen (TYLENOL) 500 MG tablet Take 500 mg by mouth in the morning, at noon, and at bedtime.   acetaZOLAMIDE ER (DIAMOX) 500 MG capsule Take 500 mg by mouth at bedtime.   Ascorbic Acid (VITAMIN C) 1000 MG tablet Take 1,000  mg by mouth in the morning and at bedtime.   atorvastatin (LIPITOR) 10 MG tablet Take 10 mg by mouth daily.   CALCIUM CITRATE PO Take 3,000 mg by mouth daily.   cholecalciferol (VITAMIN D3) 25 MCG (1000 UNIT) tablet Take 1,000 Units by mouth daily.   clobetasol cream (TEMOVATE) 0.0.93 Apply 1 Application topically 2 (two) times a week.   Coenzyme Q10 300 MG CAPS Take 300 mg by mouth every 3 (three) days.   diclofenac Sodium (VOLTAREN) 1 % GEL Apply topically as needed.   DULoxetine (CYMBALTA) 30 MG capsule Take 30 mg by mouth daily after breakfast.    EPINEPHrine (EPIPEN JR) 0.15 MG/0.3ML injection Inject 0.15 mg into the muscle as needed for anaphylaxis.   estradiol (ESTRACE) 0.1 MG/GM vaginal cream Place 1 Applicatorful vaginally at bedtime.   famotidine (PEPCID) 20 MG tablet Take 20 mg by mouth at bedtime.   FERROUS BISGLYCINATE CHELATE PO Take 25 mg by mouth daily.    ferrous sulfate 325 (65 FE) MG tablet Take 325 mg by mouth 2 (two) times daily.   fluticasone (FLONASE) 50 MCG/ACT nasal spray Place 1 spray into both nostrils at bedtime.    GEMTESA 75 MG TABS Take 1 tablet by mouth daily.   levothyroxine (SYNTHROID, LEVOTHROID) 25 MCG tablet Take 1 1/2 tablets daily (Patient taking differently: Take 37.5  mcg by mouth daily before breakfast. Take 1 1/2 tablets daily)   meloxicam (MOBIC) 15 MG tablet Take 15 mg by mouth daily.   methocarbamol (ROBAXIN) 500 MG tablet Take 500 mg by mouth 2 (two) times daily.   naproxen (NAPROSYN) 250 MG tablet Take 250 mg by mouth 2 (two) times daily with a meal.   Oxycodone HCl 10 MG TABS Take 10 mg by mouth 4 (four) times daily as needed.   polyethylene glycol (MIRALAX / GLYCOLAX) 17 g packet Take 17 g by mouth daily.   PRESCRIPTION MEDICATION Tyrvaya -- Nasal Spray -- used for dry eyes   psyllium (REGULOID) 0.52 g capsule Take 0.52 g by mouth in the morning and at bedtime.   Tart Cherry 1200 MG CAPS Take 1,200 mg by mouth daily.   traZODone (DESYREL) 100  MG tablet Take 50 mg by mouth at bedtime.   VITAMIN A PO Take by mouth in the morning, at noon, and at bedtime.   No current facility-administered medications for this visit. (Other)   REVIEW OF SYSTEMS: ROS   Positive for: Neurological, Musculoskeletal, Cardiovascular, Eyes Negative for: Constitutional, Gastrointestinal, Skin, Genitourinary, HENT, Endocrine, Respiratory, Psychiatric, Allergic/Imm, Heme/Lymph Last edited by Kingsley Spittle, COT on 05/16/2022  9:58 AM.     ALLERGIES Allergies  Allergen Reactions   Fish Allergy Anaphylaxis    Can tolerate shellfish (can eat tuna, salmon, trout, swordfish and redrum). Allergic to fish with scales and fins.   Peanut Butter Flavor Anaphylaxis    Allergic to peanuts and some tree nuts. Can eat cashews, pistachios, and almonds.    Other Other (See Comments)    Unknown.  Tree nuts -- most but not all   PAST MEDICAL HISTORY Past Medical History:  Diagnosis Date   ADHD    ADD   Allergic to pets    pet dander   Allergies    Anemia    take iron   Anxiety    Arthritis    hands, hip, shoulders back,   Asthma    as a child   Bicuspid aortic valve    bicuspid AV with mild AS, ascending aorta 3.40 cm 0/35/46 echo   Complication of anesthesia    slow to wake up and lingers for a long time   Concussion    Depression    Family history of adverse reaction to anesthesia    mom had skin reaction to anesthesia but cant remember what it was or exactly what happened   Fatigue    Fibromyalgia    GERD (gastroesophageal reflux disease)    Hyperlipidemia    Hypothyroidism    Irregular periods    Joint pain    Joint stiffness    Lower back pain    Menopause    Migraines    no longer   Mitral regurgitation    Numbness and tingling    Pollen allergy    Presence of pessary    Ringing in ears    Sleep apnea    does not wear CPAP tapes mouth at night   Snoring    Past Surgical History:  Procedure Laterality Date   ABDOMINAL  EXPOSURE N/A 04/12/2020   Procedure: ABDOMINAL EXPOSURE;  Surgeon: [name], MD;  Location: [facility] OR;  Service: Vascular;  Laterality: N/A;   ABDOMINOPLASTY     ANTERIOR CERVICAL DECOMP/DISCECTOMY FUSION N/A 06/28/2016   Procedure: ACDF C5-7 ANTERIOR CERVICAL DECOMPRESSION/DISCECTOMY FUSION 2 LEVELS;  Surgeon: Melina Schools, MD;  Location: [facility]  OR;  Service: Orthopedics;  Laterality: N/A;  Requests 3 hrs   ANTERIOR LUMBAR FUSION N/A 04/12/2020   Procedure: Anterior Lumbar Interbody Fusion - Lumbar four-Lumbar five , posterior instrumented fusion Lumbar four-five lumbar three -four;  Surgeon: Eustace Moore, MD;  Location: [place];  Service: Neurosurgery;  Laterality: N/A;   BREAST BIOPSY Right 09/19/2021   CARPAL TUNNEL RELEASE Bilateral 1996   CATARACT EXTRACTION Bilateral    03/08/22 and 03/15/2022   COLONOSCOPY     DENTAL SURGERY     HERNIA REPAIR     times 2   LAMINECTOMY WITH POSTERIOR LATERAL ARTHRODESIS LEVEL 1 N/A 04/12/2020   Procedure: Posterior Instrumented Fusion Lumbar three to lumbar five;  Surgeon: Eustace Moore, MD;  Location: [place];  Service: Neurosurgery;  Laterality: N/A;   LUMBAR LAMINECTOMY/DECOMPRESSION MICRODISCECTOMY  04/21/2015   Procedure: DECOMPRESSION LUMBAR THREE-LUMBAR FOUR;  Surgeon: Melina Schools, MD;  Location: [place];  Service: Orthopedics;;   LUMBAR LAMINECTOMY/DECOMPRESSION MICRODISCECTOMY N/A 06/20/2017   Procedure: L4-5 decompression, L5-S1 left laminotomy/foraminotomy;  Surgeon: Melina Schools, MD;  Location: [place];  Service: Orthopedics;  Laterality: N/A;  3 hrs   PARATHYROIDECTOMY  2012   RADIOACTIVE SEED GUIDED EXCISIONAL BREAST BIOPSY Right 10/28/2021   Procedure: RADIOACTIVE SEED GUIDED EXCISIONAL RIGHT BREAST BIOPSY;  Surgeon: Stark Klein, MD;  Location: [place];  Service: General;  Laterality: Right;   SACROILIAC JOINT FUSION Right 07/24/2018   Procedure: SACROILIAC JOINT FUSION;  Surgeon: Melina Schools, MD;  Location: [place];  Service: Orthopedics;  Laterality: Right;  90 mins   SACROILIAC JOINT FUSION Left 01/12/2022   Procedure: SPINAL CORD STIMULATOR PLACEMENT, LEFT SACROILIAC FUSION;  Surgeon: Melina Schools, MD;  Location: [place];  Service: Orthopedics;  Laterality: Left;   SHOULDER ARTHROSCOPY W/ ACROMIAL REPAIR Right    SPINAL CORD STIMULATOR INSERTION Left 01/12/2022   Procedure: LUMBAR SPINAL CORD STIMULATOR INSERTION;  Surgeon: Melina Schools, MD;  Location: [place];  Service: Orthopedics;  Laterality: Left;   TONSILLECTOMY     TOTAL HIP ARTHROPLASTY Right 11/10/2020   Procedure: TOTAL HIP ARTHROPLASTY ANTERIOR APPROACH;  Surgeon: Gaynelle Arabian, MD;  Location: [facility];  Service: Orthopedics;  Laterality: Right;  18mn   FAMILY HISTORY Family History  Problem Relation Age of Onset   COPD Mother    Cancer Mother    Hypertension Mother    Hyperlipidemia Mother    Heart disease Mother    Thyroid disease Mother    Thyroid disease Sister    Thyroid disease Daughter    Pseudotumor cerebri Neg Hx    SOCIAL HISTORY Social History   Tobacco Use   Smoking status: Former    Packs/day: 0.25    Years: 4.00    Total pack years: 1.00    Types: Cigarettes    Quit date: 1976    Years since quitting: 47.9   Smokeless tobacco: Never   Tobacco comments:    social smoker stop at age 71 Vaping Use   Vaping Use: Never used  Substance Use Topics   Alcohol use: Yes    Alcohol/week: 1.0 - 2.0 standard drink of alcohol    Types: 1 - 2 Glasses of wine per week    Comment: socially   Drug use: No       OPHTHALMIC EXAM:  Base Eye Exam     Visual Acuity (Snellen - Linear)       Right Left   Dist [place] 20/20 -2 20/30 -1  Dist ph [place]  20/25         Tonometry (Tonopen, 10:04 AM)       Right Left   Pressure 12 10         Pupils       Dark Light Shape React APD   Right 2 2 Round Minimal None   Left 2 2 Round Minimal None         Visual Fields (Counting fingers)       Left Right     Full Full         Extraocular Movement       Right Left    Full, Ortho Full, Ortho         Neuro/Psych     Oriented x3: Yes   Mood/Affect: Normal         Dilation     Both eyes: 1.0% Mydriacyl, 2.5% Phenylephrine @ 10:05 AM           Slit Lamp and Fundus Exam     Slit Lamp Exam       Right Left   Lids/Lashes Dermatochalasis - upper lid, mild MGD Dermatochalasis - upper lid, mild MGD   Conjunctiva/Sclera White and quiet White and quiet   Cornea 1-2+ Punctate epithelial erosions, well healed cataract wound, tear film debris 1-2+ Punctate epithelial erosions greatest inferiorly, arcus, well healed cataract wound   Anterior Chamber deep and clear deep and clear   Iris Round and dilated Round and dilated   Lens PC IOL in good position PC IOL in good position   Anterior Vitreous Vitreous syneresis, Posterior vitreous detachment Vitreous syneresis, no pigment, Posterior vitreous detachment, operculum ST periphery         Fundus Exam       Right Left   Disc Pink and Sharp, mild tilt, focal PPP temporal, +elevation Pink, +elevation/edema greatest superior quad, mild blurring of margins   C/D Ratio 0.2 0.2   Macula Flat, good foveal reflex, mild RPE mottling, No heme or edema Flat, good foveal reflex, mild RPE mottling, No heme or edema   Vessels attenuated, Tortuous attenuated, Tortuous   Periphery Attached, No heme Attached, pigmented lattice with VR tuft at 0430, operculated hole at 0130 ora -- good laser surrounding all lesions, No heme, no new RT/RD           IMAGING AND PROCEDURES  Imaging and Procedures for 05/16/2022  OCT, Retina - OU - Both Eyes       Right Eye Quality was good. Central Foveal Thickness: 254. Progression has worsened. Findings include normal foveal contour, no IRF, no SRF (?mild interval increase in disc edema).   Left Eye Quality was good. Central Foveal Thickness: 263. Progression has worsened. Findings include normal foveal  contour, no IRF, no SRF (Mild interval increase in disc edema).   Notes *Images captured and stored on drive  Diagnosis / Impression:  NFP, no IRF/SRF OU Mild interval increase in disc edema OU  Clinical management:  See below  Abbreviations: NFP - Normal foveal profile. CME - cystoid macular edema. PED - pigment epithelial detachment. IRF - intraretinal fluid. SRF - subretinal fluid. EZ - ellipsoid zone. ERM - epiretinal membrane. ORA - outer retinal atrophy. ORT - outer retinal tubulation. SRHM - subretinal hyper-reflective material. IRHM - intraretinal hyper-reflective material            ASSESSMENT/PLAN:    ICD-10-CM   1. Retinal hole of left eye  H33.322 OCT, Retina - OU -  Both Eyes    2. Lattice degeneration of left retina  H35.412     3. Vitreoretinal tuft of left eye  Q14.1     4. Edema of optic disc of left eye  H47.10     5. Pseudophakia, both eyes  Z96.1     6. Dry eyes  H04.123       1. Operculated retinal hole, OS   - operculated hole at 0130 ora - s/p indirect laser retinopexy OS (10.24.23) -- good laser surrounding - no new RT/RD or lattice OS - f/u 3 months -- DFE, OCT  2,3. Lattice degeneration w/ VR tuft, left eye - pigmented lattice with VR tuft at 0430 - s/p laser retinopexy OS as above -- good laser in place - f/u 3 mos - DFE, OCT  4. Optic disc edema OU (OS > OD)  - mild interval increase in disc edema / elevation OU on exam and OCT today  - BCVA 20/20 OS  - pt reports vague punctate colored objects in vision -- denies flashes  - pt reports history of lower back / spine / nerve issues -- has a spinal cord stimulator  - pt had MRI orbits and LP done emergently on 11.9.23 due to worsening disc edema   MRI orbits showed findings suggesting of IIH and papilledema   LP showed opening pressure of 22cm H2O -- slightly elevated  - 11.29.23 -- pt saw Dr. Jaynee Eagles, Neurologist at [facility] who advised stoppage of Vit A derivative  meds  - 12.1.23 -- pt saw Dr. Danise Mina, Neuro-[facility] at [facility] and has a follow up on 01.26.24   Disc edema may be relative IIH w/ mildly elevated opening pressure and low IOP Started on 500 mg diamox daily  5. Pseudophakia OU  - s/p CE/IOL (Dr. Valetta Close, OD: 09.23.23, OS: 10.04.23)  - IOL in good position, doing well  - monitor  6. Dry eyes OU - recommend artificial tears and lubricating ointment as needed   Ophthalmic Meds Ordered this visit:  No orders of the defined types were placed in this encounter.    Return in about 3 months (around 08/15/2022) for f/u operculated hole OS, DFE, OCT.  There are no Patient Instructions on file for this visit.  Explained the diagnoses, plan, and follow up with the patient and they expressed understanding.  Patient expressed understanding of the importance of proper follow up care.   This document serves as a record of services personally performed by Gardiner Sleeper, MD, PhD. It was created on their behalf by Renaldo Reel, [place] an ophthalmic technician. The creation of this record is the provider's dictation and/or activities during the visit.    Electronically signed by:  Renaldo Reel, COT  11.27.23 5:24 PM  This document serves as a record of services personally performed by Gardiner Sleeper, MD, PhD. It was created on their behalf by San Jetty. Owens Shark, OA an ophthalmic technician. The creation of this record is the provider's dictation and/or activities during the visit.    Electronically signed by: San Jetty. Owens Shark, [place] 12.05.2023 5:24 PM  Gardiner Sleeper, M.D., Ph.D. Diseases & Surgery of the Retina and Vitreous [facility]  I have reviewed the above documentation for accuracy and completeness, and I agree with the above. Gardiner Sleeper, M.D., Ph.D. 05/17/22 5:34 PM   Abbreviations: M myopia (nearsighted); A astigmatism; H hyperopia (farsighted); P presbyopia; Mrx spectacle prescription;  CTL contact lenses; OD right  eye; OS left eye; OU both eyes  XT exotropia; ET esotropia; PEK punctate epithelial keratitis; PEE punctate epithelial erosions; DES dry eye syndrome; MGD meibomian gland dysfunction; ATs artificial tears; PFAT's preservative free artificial tears; [place] nuclear sclerotic cataract; PSC posterior subcapsular cataract; ERM epi-retinal membrane; PVD posterior vitreous detachment; RD retinal detachment; DM diabetes mellitus; DR diabetic retinopathy; NPDR non-proliferative diabetic retinopathy; PDR proliferative diabetic retinopathy; CSME clinically significant macular edema; DME diabetic macular edema; dbh dot blot hemorrhages; CWS cotton wool spot; POAG primary open angle glaucoma; C/D cup-to-disc ratio; HVF humphrey visual field; GVF goldmann visual field; OCT optical coherence tomography; IOP intraocular pressure; BRVO Branch retinal vein occlusion; CRVO central retinal vein occlusion; CRAO central retinal artery occlusion; BRAO branch retinal artery occlusion; RT retinal tear; SB scleral buckle; PPV pars plana vitrectomy; VH Vitreous hemorrhage; PRP panretinal laser photocoagulation; IVK intravitreal kenalog; VMT vitreomacular traction; MH Macular hole;  NVD neovascularization of the disc; NVE neovascularization elsewhere; AREDS age related eye disease study; ARMD age related macular degeneration; POAG primary open angle glaucoma; EBMD epithelial/anterior basement membrane dystrophy; ACIOL anterior chamber intraocular lens; IOL intraocular lens; PCIOL posterior chamber intraocular lens; Phaco/IOL phacoemulsification with intraocular lens placement; [place] photorefractive keratectomy; LASIK laser assisted in situ keratomileusis; HTN hypertension; DM diabetes mellitus; COPD chronic obstructive pulmonary disease

## 2022-05-09 LAB — MISC LABCORP TEST (SEND OUT)
Labcorp test code: 9985
Labcorp test code: 9985

## 2022-05-09 NOTE — Progress Notes (Unsigned)
[facility]    Provider:  Dr Jaynee Eagles Requesting Provider: Lennice Sites, DO Primary Care Provider:  Kelton Pillar, MD  CC:  idiopathic intracranial HTN  HPI:  Courtney [name] is a 71 y.o. female here as requested by Lennice Sites, DO for full edema.  She presented to the emergency room April 20, 2022, I reviewed Dr. 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, obstructive sleep apnea managed by taping, remote migraine headaches, lumbar fusion and spinal cord stimulator placed August 2023, recent cataract surgery first in her right eye October 4 followed by her left eye 2 weeks later, the retina tear required laser repair in the left eye 2.5 weeks ago and in that setting had some photopsias in the left eye starting in the left lower quadrant and moving up towards the upper right quadrant sometimes triggered by bending happening a few times a day, then she was noted to have optic edema of the left eye, patient saw her ophthalmologist in the seventh, they noted that she had optic disc edema changes on exam of the left eye, she was given a referral to neuro-ophthalmologist however she followed up with her other ophthalmologist and they started noticing edema bilaterally and they recommended she go to the ED for further evaluation.  She states she has had some vague punctate color objects in the left eye laterally in her field of vision that is intermittent.  Imaging was recommended and an LP, unfortunately patient had a spinal stimulator in place, sed rate was normal, CRP was slightly elevated, CBC metabolic panel is otherwise unremarkable, MRI did not show any acute findings other than mild flattening of the left globe and fluid bilaterally in the dural sheath of the optic nerves.  She had no complaints of headaches and no new visual changes.  She was seen by neurology who diagnosed her  with asymptomatic bilateral papilledema.  They noted she had been taking vitamin A for quite some time for an unclear indication, this can cause idiopathic intracranial hypertension, neurologic exam showed full visual fields, normal pupils, bilateral papilledema, extraocular movements intact, normal strength except for some right hip flexor weakness 4+ out of 5, stable sensory loss in the right leg from the groin to the toes from prior surgeries, 3+ deep tendon reflexes otherwise normal.  She was advised to stop vitamin A, infectious inflammatory etiologies were unlikely given the remainder however ophthalmological exam being reassuring as well as no objective loss of visual field cuts and full ophthalmologic testing as well as no subjective symptoms.  Happened after cataract surgery and a tear, she started seeing sprays of light in left eye and thought it was an artifact of the laser surgery, Dr. Valetta Close at ophthalmology, progressed to papilledema, asymptomatic, no headaches, since being in the ER no new vision changes, still getting the sprays of light in the left eye and maybe in the right eye. We discussed all of the above, reviewed images. No headaches, no pressue in the head, no hearing changes, no weakness, no facial drooping, bascially asymptomatic and she was sent to the ED for optic nerve head edema. Her vision fields are normal.  No other focal neurologic deficits, associated symptoms, inciting events or modifiable factors.She is seeing neuroophthalmology or [facility] on Friday. No other focal neurologic deficits, associated symptoms, inciting events or modifiable factors.    Reviewed notes, labs and imaging  from outside physicians, which showed:    MRI brain and MRI orbits with without contrast personally reviewed, agree with radiology 1. Possible mild flattening of the posterior aspect of the left globe and prominent fluid in the dural sheath of the bilateral optic nerve, particularly  anteriorly, which can be seen with papilledema. 2. No acute intracranial process. 3. Partial empty sella and optic nerve tortuosity, which are nonspecific findings but can be seen in the setting of idiopathic intracranial hypertension.  Review of Systems: Patient complains of symptoms per HPI as well as the following symptoms arthritis. Pertinent negatives and positives per HPI. All others negative.   Social History   Socioeconomic History   Marital status: Significant Other    Spouse name: Not on file   Number of children: 2   Years of education: Master's    Highest education level: Not on file  Occupational History   Occupation: Retired   Tobacco Use   Smoking status: Former    Packs/day: 0.25    Years: 4.00    Total pack years: 1.00    Types: Cigarettes    Quit date: 1976    Years since quitting: 47.9   Smokeless tobacco: Never   Tobacco comments:    social smoker stop at age 25  Vaping Use   Vaping Use: Never used  Substance and Sexual Activity   Alcohol use: Yes    Alcohol/week: 1.0 - 2.0 standard drink of alcohol    Types: 1 - 2 Glasses of wine per week    Comment: socially   Drug use: No   Sexual activity: Never  Other Topics Concern   Not on file  Social History Narrative   Lives alone   Caffeine use: 1 cup coffee/day   Social Determinants of Health   Financial Resource Strain: Not on file  Food Insecurity: Not on file  Transportation Needs: Not on file  Physical Activity: Not on file  Stress: Not on file  Social Connections: Not on file  Intimate Partner Violence: Not on file    Family History  Problem Relation Age of Onset   COPD Mother    Cancer Mother    Hypertension Mother    Hyperlipidemia Mother    Heart disease Mother    Thyroid disease Mother    Thyroid disease Sister    Thyroid disease Daughter    Pseudotumor cerebri Neg Hx     Past Medical History:  Diagnosis Date   ADHD    ADD   Allergic to pets    pet dander   Allergies     Anemia    take iron   Anxiety    Arthritis    hands, hip, shoulders back,   Asthma    as a child   Bicuspid aortic valve    bicuspid AV with mild AS, ascending aorta 3.40 cm 2/54/27 echo   Complication of anesthesia    slow to wake up and lingers for a long time   Concussion    Depression    Family history of adverse reaction to anesthesia    mom had skin reaction to anesthesia but cant remember what it was or exactly what happened   Fatigue    Fibromyalgia    GERD (gastroesophageal reflux disease)    Hyperlipidemia    Hypothyroidism    Irregular periods    Joint pain    Joint stiffness    Lower back pain    Menopause    Migraines  no longer   Mitral regurgitation    Numbness and tingling    Pollen allergy    Presence of pessary    Ringing in ears    Sleep apnea    does not wear CPAP tapes mouth at night   Snoring     Patient Active Problem List   Diagnosis Date Noted   Chronic pain 01/12/2022   Pain in hip region after hip replacement 10/03/2021   OA (osteoarthritis) of hip 11/10/2020   S/P lumbar fusion 04/12/2020   Bicuspid aortic valve 09/25/2019   SI (sacroiliac) pain 07/24/2018   Status post lumbar spine surgery for decompression of spinal cord 06/20/2017   ADD (attention deficit disorder) 01/05/2017   Asthma, extrinsic 01/05/2017   GERD (gastroesophageal reflux disease) 01/05/2017   Hypercalcemia 01/05/2017   Insomnia 01/05/2017   Internal hemorrhoids 01/05/2017   Mixed incontinence urge and stress 11/22/2016   Neck pain 06/28/2016   Spinal stenosis 04/21/2015   Preoperative clearance 01/29/2015   Hyperlipidemia 01/29/2015   H/O cardiac murmur 01/29/2015   Lumbar facet arthropathy 10/27/2014   Lumbar spondylosis with myelopathy 06/23/2014   Spinal stenosis, lumbar region, with neurogenic claudication 06/23/2014   Degenerative cervical disc 06/23/2014   Cervical spondylosis without myelopathy 06/23/2014   High risk medication use 10/15/2013    Facet arthropathy, cervical 08/28/2013   Fatty liver 04/26/2013   Hemangioma 04/09/2013   DDD (degenerative disc disease), lumbosacral 12/31/2012   Left hip pain 12/31/2012   Lumbar radiculopathy 12/12/2012   Left shoulder pain 08/07/2012   Low back pain 08/07/2012   H/O thyroid nodule 06/08/2012   ADHD (attention deficit hyperactivity disorder) 12/24/2011   Depression 07/11/2011   Fatigue 07/11/2011    Past Surgical History:  Procedure Laterality Date   ABDOMINAL EXPOSURE N/A 04/12/2020   Procedure: ABDOMINAL EXPOSURE;  Surgeon: [name], MD;  Location: [facility] OR;  Service: Vascular;  Laterality: N/A;   ABDOMINOPLASTY     ANTERIOR CERVICAL DECOMP/DISCECTOMY FUSION N/A 06/28/2016   Procedure: ACDF C5-7 ANTERIOR CERVICAL DECOMPRESSION/DISCECTOMY FUSION 2 LEVELS;  Surgeon: Melina Schools, MD;  Location: [place];  Service: Orthopedics;  Laterality: N/A;  Requests 3 hrs   ANTERIOR LUMBAR FUSION N/A 04/12/2020   Procedure: Anterior Lumbar Interbody Fusion - Lumbar four-Lumbar five , posterior instrumented fusion Lumbar four-five lumbar three -four;  Surgeon: Eustace Moore, MD;  Location: [place];  Service: Neurosurgery;  Laterality: N/A;   BREAST BIOPSY Right 09/19/2021   CARPAL TUNNEL RELEASE Bilateral 1996   CATARACT EXTRACTION Bilateral    03/08/22 and 03/15/2022   COLONOSCOPY     DENTAL SURGERY     HERNIA REPAIR     times 2   LAMINECTOMY WITH POSTERIOR LATERAL ARTHRODESIS LEVEL 1 N/A 04/12/2020   Procedure: Posterior Instrumented Fusion Lumbar three to lumbar five;  Surgeon: Eustace Moore, MD;  Location: [place];  Service: Neurosurgery;  Laterality: N/A;   LUMBAR LAMINECTOMY/DECOMPRESSION MICRODISCECTOMY  04/21/2015   Procedure: DECOMPRESSION LUMBAR THREE-LUMBAR FOUR;  Surgeon: Melina Schools, MD;  Location: [place];  Service: Orthopedics;;   LUMBAR LAMINECTOMY/DECOMPRESSION MICRODISCECTOMY N/A 06/20/2017   Procedure: L4-5 decompression, L5-S1 left laminotomy/foraminotomy;  Surgeon:  Melina Schools, MD;  Location: [place];  Service: Orthopedics;  Laterality: N/A;  3 hrs   PARATHYROIDECTOMY  2012   RADIOACTIVE SEED GUIDED EXCISIONAL BREAST BIOPSY Right 10/28/2021   Procedure: RADIOACTIVE SEED GUIDED EXCISIONAL RIGHT BREAST BIOPSY;  Surgeon: Stark Klein, MD;  Location: [place];  Service: General;  Laterality: Right;  SACROILIAC JOINT FUSION Right 07/24/2018   Procedure: SACROILIAC JOINT FUSION;  Surgeon: Melina Schools, MD;  Location: [place];  Service: Orthopedics;  Laterality: Right;  90 mins   SACROILIAC JOINT FUSION Left 01/12/2022   Procedure: SPINAL CORD STIMULATOR PLACEMENT, LEFT SACROILIAC FUSION;  Surgeon: Melina Schools, MD;  Location: [place];  Service: Orthopedics;  Laterality: Left;   SHOULDER ARTHROSCOPY W/ ACROMIAL REPAIR Right    SPINAL CORD STIMULATOR INSERTION Left 01/12/2022   Procedure: LUMBAR SPINAL CORD STIMULATOR INSERTION;  Surgeon: Melina Schools, MD;  Location: [place];  Service: Orthopedics;  Laterality: Left;   TONSILLECTOMY     TOTAL HIP ARTHROPLASTY Right 11/10/2020   Procedure: TOTAL HIP ARTHROPLASTY ANTERIOR APPROACH;  Surgeon: Gaynelle Arabian, MD;  Location: [facility];  Service: Orthopedics;  Laterality: Right;  125mn    Current Outpatient Medications  Medication Sig Dispense Refill   acetaminophen (TYLENOL) 500 MG tablet Take 500 mg by mouth in the morning, at noon, and at bedtime.     Artificial Tear Solution (GENTEAL TEARS OP) Place 1 drop into both eyes daily as needed (dry eyes).     Ascorbic Acid (VITAMIN C) 1000 MG tablet Take 1,000 mg by mouth in the morning and at bedtime.     atorvastatin (LIPITOR) 10 MG tablet Take 10 mg by mouth daily.     CALCIUM CITRATE PO Take 3,000 mg by mouth daily.     Carboxymethylcellul-Glycerin (REFRESH OPTIVE OP) Place 1 drop into both eyes daily as needed (dry eyes).     cholecalciferol (VITAMIN D3) 25 MCG (1000 UNIT) tablet Take 1,000 Units by mouth daily.     clobetasol cream (TEMOVATE)  08.67% Apply 1 Application topically 2 (two) times a week.     Coenzyme Q10 300 MG CAPS Take 300 mg by mouth every 3 (three) days.     diclofenac Sodium (VOLTAREN) 1 % GEL Apply topically as needed.     DULoxetine (CYMBALTA) 30 MG capsule Take 30 mg by mouth daily after breakfast.      EPINEPHrine (EPIPEN JR) 0.15 MG/0.3ML injection Inject 0.15 mg into the muscle as needed for anaphylaxis.     estradiol (ESTRACE) 0.1 MG/GM vaginal cream Place 1 Applicatorful vaginally at bedtime.     famotidine (PEPCID) 20 MG tablet Take 20 mg by mouth at bedtime.     FERROUS BISGLYCINATE CHELATE PO Take 25 mg by mouth daily.      ferrous sulfate 325 (65 FE) MG tablet Take 325 mg by mouth 2 (two) times daily.     fluticasone (FLONASE) 50 MCG/ACT nasal spray Place 1 spray into both nostrils at bedtime.      GEMTESA 75 MG TABS Take 1 tablet by mouth daily.     levothyroxine (SYNTHROID, LEVOTHROID) 25 MCG tablet Take 1 1/2 tablets daily (Patient taking differently: Take 37.5 mcg by mouth daily before breakfast. Take 1 1/2 tablets daily) 130 tablet 2   meloxicam (MOBIC) 15 MG tablet Take 15 mg by mouth daily.     methocarbamol (ROBAXIN) 500 MG tablet Take 500 mg by mouth 2 (two) times daily.     naproxen (NAPROSYN) 250 MG tablet Take 250 mg by mouth 2 (two) times daily with a meal.     Oxycodone HCl 10 MG TABS Take 10 mg by mouth 4 (four) times daily as needed.     polyethylene glycol (MIRALAX / GLYCOLAX) 17 g packet Take 17 g by mouth daily.     PRESCRIPTION MEDICATION Tyrvaya -- Nasal Spray --  used for dry eyes     psyllium (REGULOID) 0.52 g capsule Take 0.52 g by mouth in the morning and at bedtime.     Tart Cherry 1200 MG CAPS Take 1,200 mg by mouth daily.     traZODone (DESYREL) 100 MG tablet Take 50 mg by mouth at bedtime.     VITAMIN A PO Take by mouth in the morning, at noon, and at bedtime.     No current facility-administered medications for this visit.    Allergies as of 05/10/2022 - Review Complete  05/10/2022  Allergen Reaction Noted   Fish allergy Anaphylaxis 06/23/2014   Peanut butter flavor Anaphylaxis 01/05/2022   Other Other (See Comments) 04/20/2022    Vitals: BP (!) 110/57 (BP Location: Left Arm, Patient Position: Sitting)   Pulse 69   Ht '5\' 4"'$  (1.626 m)   Wt 144 lb (65.3 kg)   LMP  (LMP Unknown)   BMI 24.72 kg/m  Last Weight:  Wt Readings from Last 1 Encounters:  05/10/22 144 lb (65.3 kg)   Last Height:   Ht Readings from Last 1 Encounters:  05/10/22 '5\' 4"'$  (1.626 m)     Physical exam: Exam: Gen: NAD, conversant, well nourised,  well groomed                     CV: RRR, no MRG. No Carotid Bruits. No peripheral edema, warm, nontender Eyes: Conjunctivae clear without exudates or hemorrhage  Neuro: Detailed Neurologic Exam  Speech:    Speech is normal; fluent and spontaneous with normal comprehension.  Cognition:    The patient is oriented to person, place, and time;     recent and remote memory intact;     language fluent;     normal attention, concentration,     fund of knowledge Cranial Nerves:    The pupils are equal, round, and reactive to light. Limited fundoscopy due to small pupils but appears moderare ONH edema (see pics end of note) Visual fields are full to finger confrontation. Extraocular movements are intact. Trigeminal sensation is intact and the muscles of mastication are normal. The face is symmetric. The palate elevates in the midline. Hearing intact. Voice is normal. Shoulder shrug is normal. The tongue has normal motion without fasciculations.   Coordination: nml  Gait: nml  Motor Observation:    No asymmetry, no atrophy, and no involuntary movements noted. Tone:    Normal muscle tone.    Posture:    Posture is normal. normal erect    Strength: mild prox right leg weakness prior surgeries, otherwise strength is V/V in the upper and lower limbs.      Sensation: intact to LT distally limbs and thorax     Reflex  Exam:  DTR's:    Deep tendon reflexes in the upper and lower extremities are symmetrical  bilaterally slightly brisk  Toes:    The toes are downgoing bilaterally.   Clonus:    Clonus is absent.    Assessment/Plan:  71 y.o. female here as requested by Lennice Sites, DO for full edema.  She presented to the emergency room April 20, 2022, I reviewed Dr. 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   She was seen by multiple dctors and then ED where neurology who diagnosed her with asymptomatic bilateral papilledema.  They noted she had been taking vitamin A for quite some time for an unclear indication, this can cause idiopathic intracranial hypertension, neurologic exam showed full visual fields, normal pupils, bilateral papilledema, extraocular movements intact.  In the emergency room they stop vitamin A due to vitamin A toxicity can cause intracranial hypertension Fluoroscopy guided lumbar puncture with opening pressure was 22, protein 49, glucose 58, cell counts normal except for some slightly elevated red blood cells, NMO, oligoclonal bands none observed, IgG index within normal limits, meningitis encephalitis panel nothing detected, VDRL nonreactive, CSF culture no growth, all reassuring Serum Mogg, NMO within normal limits, vitamin A levels at the higher end of normal, CRP  1.1 essentially normal, sed rate 10, HIV and RPR nonreactive all reassuring  She was advised to stop vitamin A, infectious inflammatory etiologies were unlikely given the remainder however ophthalmological exam being reassuring as well as no objective loss of visual field cuts and full ophthalmologic testing as well as no subjective symptoms.Her vision fields are normal.  No other focal neurologic deficits, associated symptoms, inciting events or modifiable factors.She is seeing neuroophthalmology or [facility] on Friday.   Would wait and monitor clinically, see what [facility] says, and then decide to treat or not based on if improving or asymptomatic may not treat but if worsening may decide to start diamox/acetazolamide.           Cc: Duayne Cal, MD  Sarina Ill, MD  [facility] [address] [place], [place] [address]  Phone [phone] Fax [phone]  I spent over 120 minutes of face-to-face and non-face-to-face time with patient on the  1. Papilledema    diagnosis.  This included previsit chart review, lab review, study review, order entry, electronic health record documentation, patient education on the different diagnostic and therapeutic options, counseling and coordination of care, risks and benefits of management, compliance, or risk factor reduction, this includes 45 minutes reviewing chart, images, notes and all as above on 05/09/2022.

## 2022-05-10 ENCOUNTER — Encounter: Payer: Self-pay | Admitting: Neurology

## 2022-05-10 ENCOUNTER — Ambulatory Visit (INDEPENDENT_AMBULATORY_CARE_PROVIDER_SITE_OTHER): Payer: Medicare Other | Admitting: Neurology

## 2022-05-10 VITALS — BP 110/57 | HR 69 | Ht 64.0 in | Wt 144.0 lb

## 2022-05-10 DIAGNOSIS — H4710 Unspecified papilledema: Secondary | ICD-10-CM | POA: Diagnosis not present

## 2022-05-10 NOTE — Patient Instructions (Signed)
F/u 8 weeks  Idiopathic Intracranial Hypertension  Idiopathic intracranial hypertension (IIH) is a condition that increases pressure around the brain. The fluid that surrounds the brain and spinal cord (cerebrospinal fluid, or CSF) increases and causes the pressure. Idiopathic means that the cause of this condition is not known. IIH affects the brain and spinal cord. If this condition is not treated, it can cause vision loss or blindness. What are the causes? The cause of this condition is not known. What increases the risk? The following factors may make you more likely to develop this condition: Being obese. Being a person who is female, between the ages of 54 and 31 years old, and who has not gone through menopause. Taking certain medicines, such as birth control, acne medicines, or steroids. What are the signs or symptoms? Symptoms of this condition include: Headaches. This is the most common symptom. Brief periods of total blindness. Double vision, blurred vision, or poor side (peripheral) vision. Pain in the shoulders or neck. Nausea and vomiting. A sound like rushing water or a pulsing sound within the ears (pulsatile tinnitus), or ringing in the ears. How is this diagnosed? This condition may be diagnosed based on: Your symptoms and medical history. Imaging tests of the brain, such as: CT scan. MRI. Magnetic resonance venogram (MRV) to check the veins. Diagnostic lumbar puncture. This is a procedure to remove and examine a sample of CSF. This procedure can determine whether your fluid pressure is too high. An eye exam to check for swelling or nerve damage in the eyes. How is this treated? Treatment for this condition depends on the symptoms. The goal of treatment is to decrease the pressure around your brain. Common treatments include: Weight loss through healthy eating, salt restriction, and exercise, if you are overweight. Medicines to decrease the production of CSF and  lower the pressure within your skull. Medicines to prevent or treat headaches. Other treatments may include: Surgery to place drains (shunts) in your brain to remove extra fluid. Lumbar puncture to remove extra CSF. Follow these instructions at home: If you are overweight or obese, work with your health care provider to lose weight. Take over-the-counter and prescription medicines only as told by your health care provider. Ask your health care provider if the medicine prescribed to you requires you to avoid driving or using machinery. Do not use any products that contain nicotine or tobacco. These products include cigarettes, chewing tobacco, and vaping devices, such as e-cigarettes. If you need help quitting, ask your health care provider. Keep all follow-up visits. Your health care provider will need to monitor you regularly. Contact a health care provider if: You have changes in your vision, such as: Double vision. Blurred vision. Poor peripheral vision. Get help right away if: You have any of the following symptoms and they get worse or do not get better: Headaches. Nausea. Vomiting. Sudden trouble seeing. This information is not intended to replace advice given to you by your health care provider. Make sure you discuss any questions you have with your health care provider. Document Revised: 10/25/2021 Document Reviewed: 10/04/2021 Elsevier Patient Education  [employer].

## 2022-05-12 DIAGNOSIS — Z961 Presence of intraocular lens: Secondary | ICD-10-CM | POA: Diagnosis not present

## 2022-05-12 DIAGNOSIS — H4710 Unspecified papilledema: Secondary | ICD-10-CM | POA: Diagnosis not present

## 2022-05-16 ENCOUNTER — Ambulatory Visit (INDEPENDENT_AMBULATORY_CARE_PROVIDER_SITE_OTHER): Payer: BC Managed Care – PPO | Admitting: Ophthalmology

## 2022-05-16 ENCOUNTER — Encounter (INDEPENDENT_AMBULATORY_CARE_PROVIDER_SITE_OTHER): Payer: Self-pay | Admitting: Ophthalmology

## 2022-05-16 DIAGNOSIS — H33322 Round hole, left eye: Secondary | ICD-10-CM

## 2022-05-16 DIAGNOSIS — Z961 Presence of intraocular lens: Secondary | ICD-10-CM | POA: Diagnosis not present

## 2022-05-16 DIAGNOSIS — H35412 Lattice degeneration of retina, left eye: Secondary | ICD-10-CM | POA: Diagnosis not present

## 2022-05-16 DIAGNOSIS — H04123 Dry eye syndrome of bilateral lacrimal glands: Secondary | ICD-10-CM

## 2022-05-16 DIAGNOSIS — Q141 Congenital malformation of retina: Secondary | ICD-10-CM | POA: Diagnosis not present

## 2022-05-16 DIAGNOSIS — H4710 Unspecified papilledema: Secondary | ICD-10-CM | POA: Diagnosis not present

## 2022-05-16 IMAGING — MG MM DIGITAL SCREENING BILAT W/ TOMO AND CAD
8 series · 9 of 24 positions shown · non-contrast
Comparison: Previous exams.

CLINICAL DATA: Screening.

EXAM:
DIGITAL SCREENING BILATERAL MAMMOGRAM WITH TOMOSYNTHESIS AND CAD
TECHNIQUE: Bilateral screening digital craniocaudal and mediolateral oblique
mammograms were obtained. Bilateral screening digital breast
tomosynthesis was performed. The images were evaluated with
computer-aided detection.

[L MLO synth-2D]
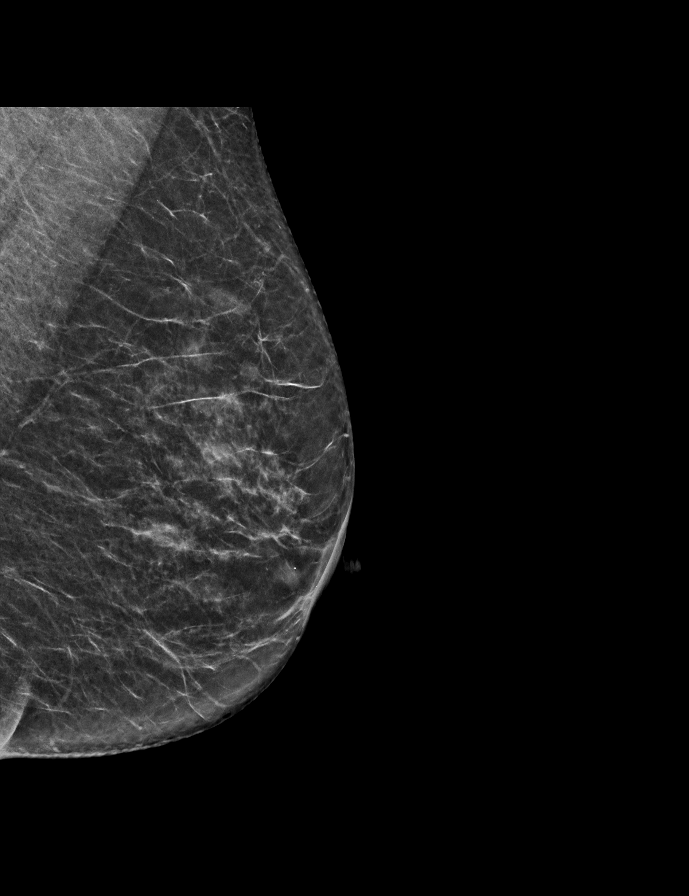

[R CC synth-2D]
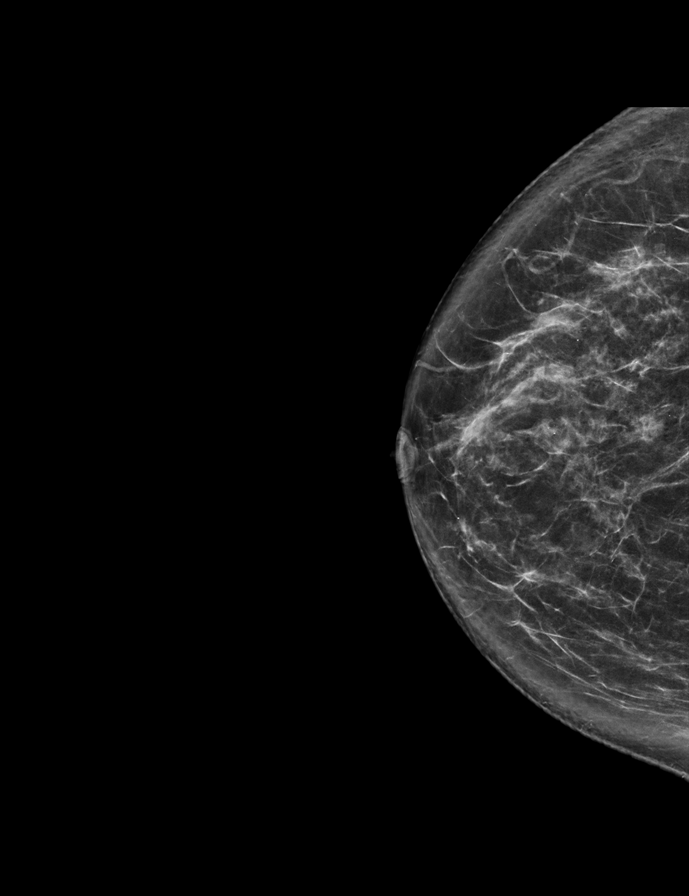

[L CC synth-2D]
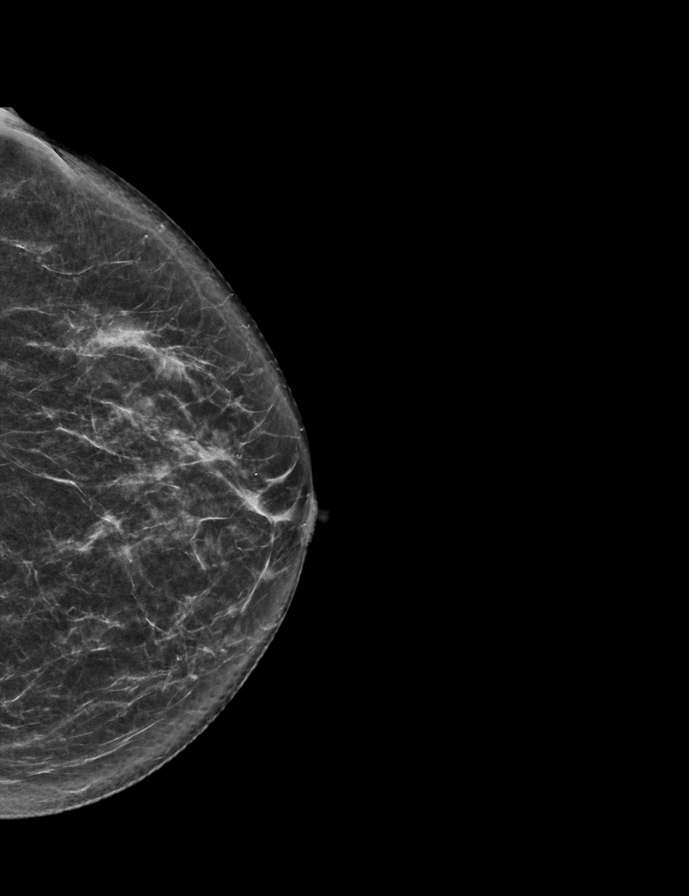

[R MLO synth-2D]
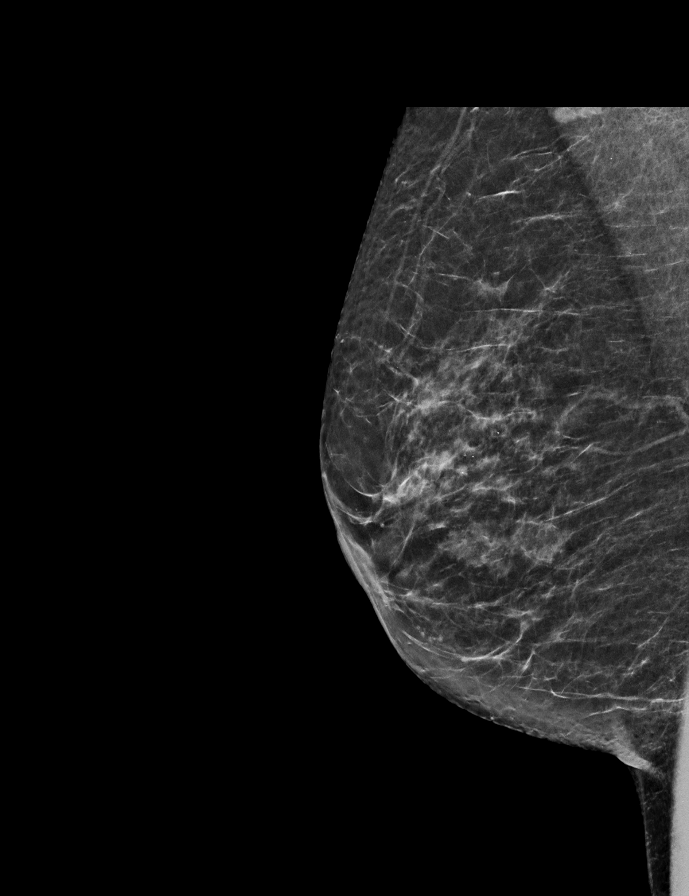

[L MLO tomo · 2 of 57 frames shown]
[frame 19/57]
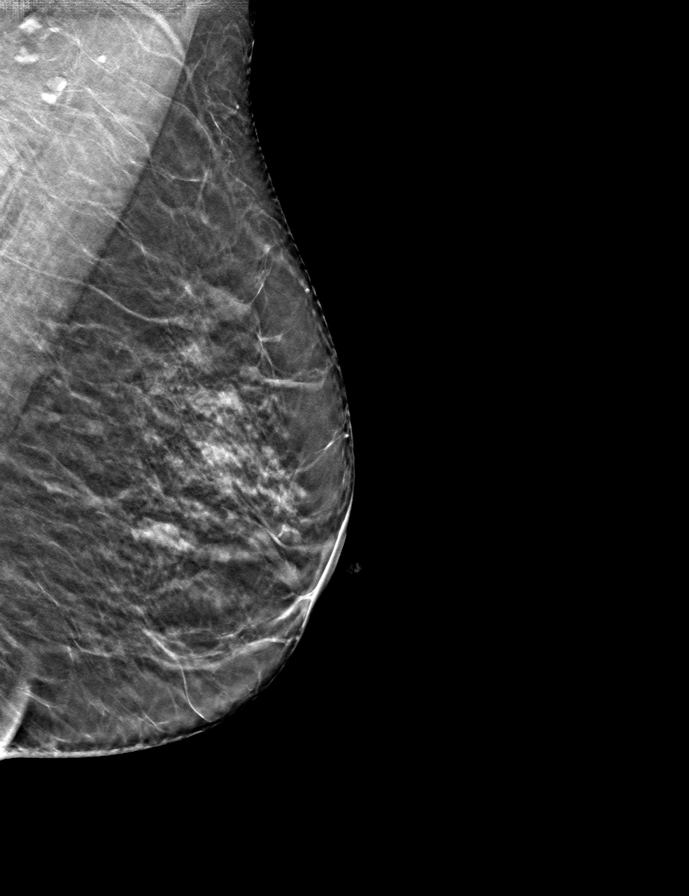
[frame 29/57]
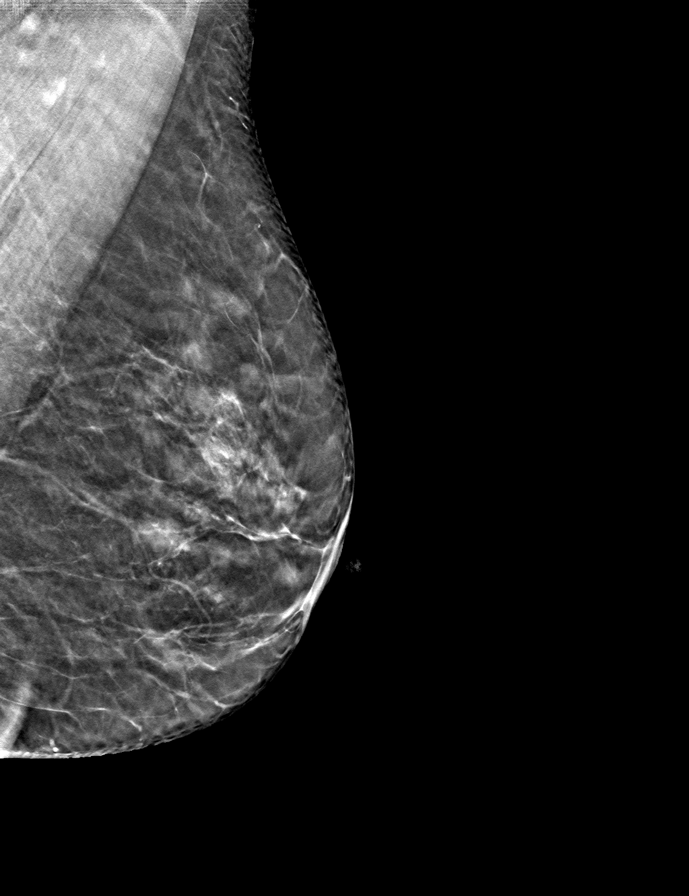

[R CC tomo · tomo slice 35/70.0]
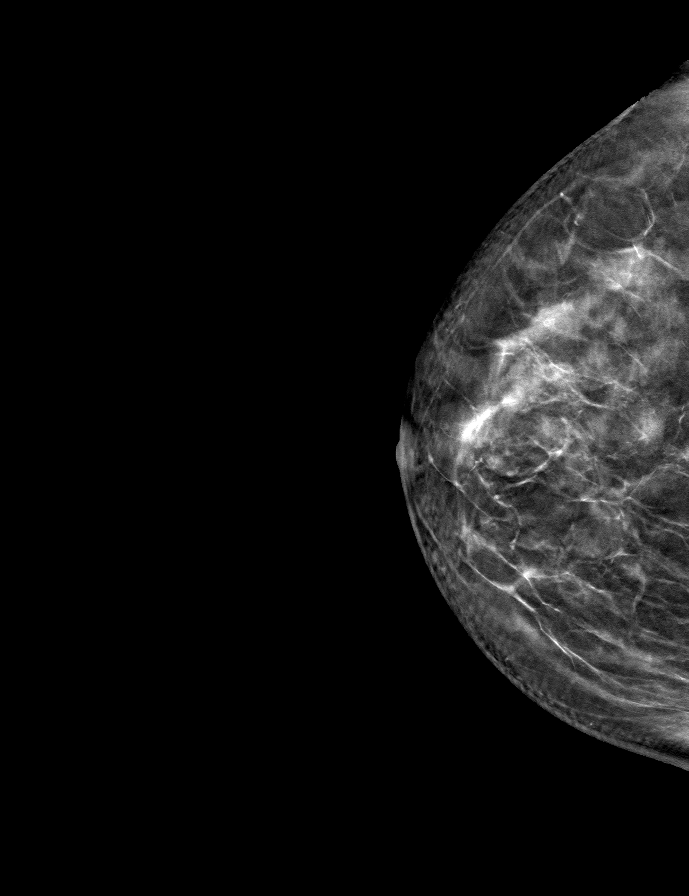

[L CC tomo · tomo slice 32/63.0]
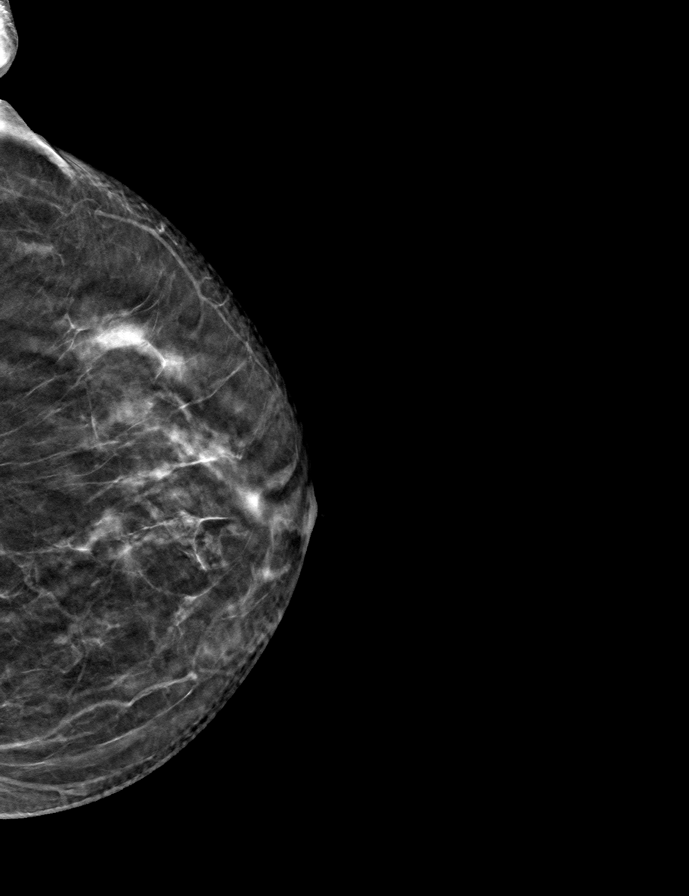

[R MLO tomo · tomo slice 31/60.0]
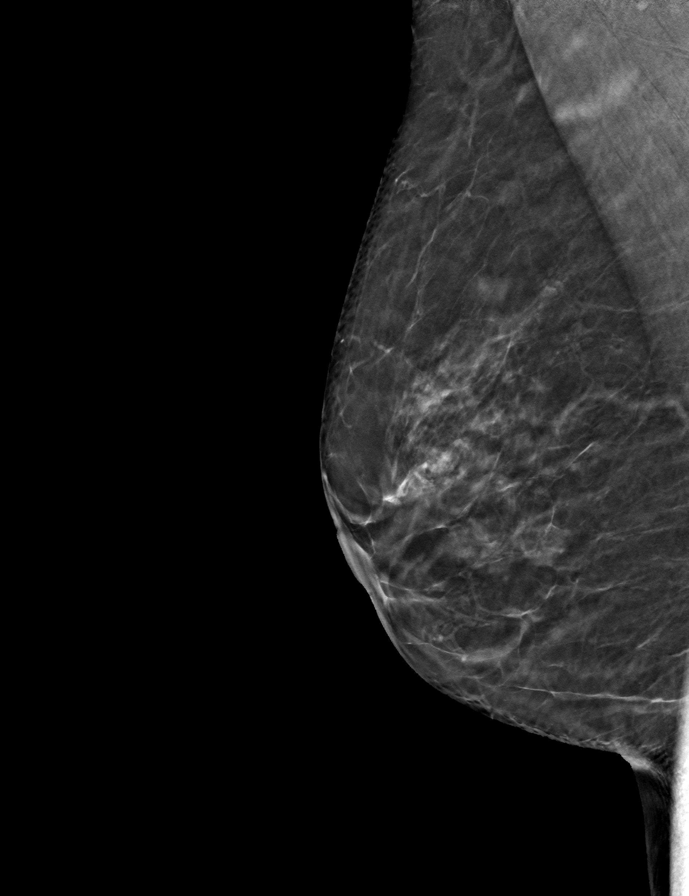

[9 of 24 positions shown; findings below may reference images not displayed]

ACR Breast Density Category b: There are scattered areas of
fibroglandular density.
FINDINGS: In the right breast, a possible asymmetry warrants further
evaluation. In the left breast, no findings suspicious for
malignancy.
IMPRESSION: Further evaluation is suggested for possible asymmetry in the right
breast.

RECOMMENDATION:
Diagnostic mammogram and possibly ultrasound of the right breast.
(Code:7P-[id number])

The patient will be contacted regarding the findings, and additional
imaging will be scheduled.

BI-RADS CATEGORY  0: Incomplete. Need additional imaging evaluation
and/or prior mammograms for comparison.

## 2022-05-17 ENCOUNTER — Encounter (INDEPENDENT_AMBULATORY_CARE_PROVIDER_SITE_OTHER): Payer: Self-pay | Admitting: Ophthalmology

## 2022-05-31 IMAGING — US US BREAST*R* LIMITED INC AXILLA
1 series · 8 of 8 positions shown · non-contrast
Comparison: Previous exam(s).

CLINICAL DATA: Patient recalled from screening for right breast
asymmetry.

EXAM:
DIGITAL DIAGNOSTIC UNILATERAL RIGHT MAMMOGRAM WITH TOMOSYNTHESIS AND
CAD; ULTRASOUND RIGHT BREAST LIMITED
TECHNIQUE: Right digital diagnostic mammography and breast tomosynthesis was
performed. The images were evaluated with computer-aided detection.;
Targeted ultrasound examination of the right breast was performed

[Series 1: us breast*right* limited inc axilla · 0.06mm/px · 8 of 8 slices shown]
[im 1/8]
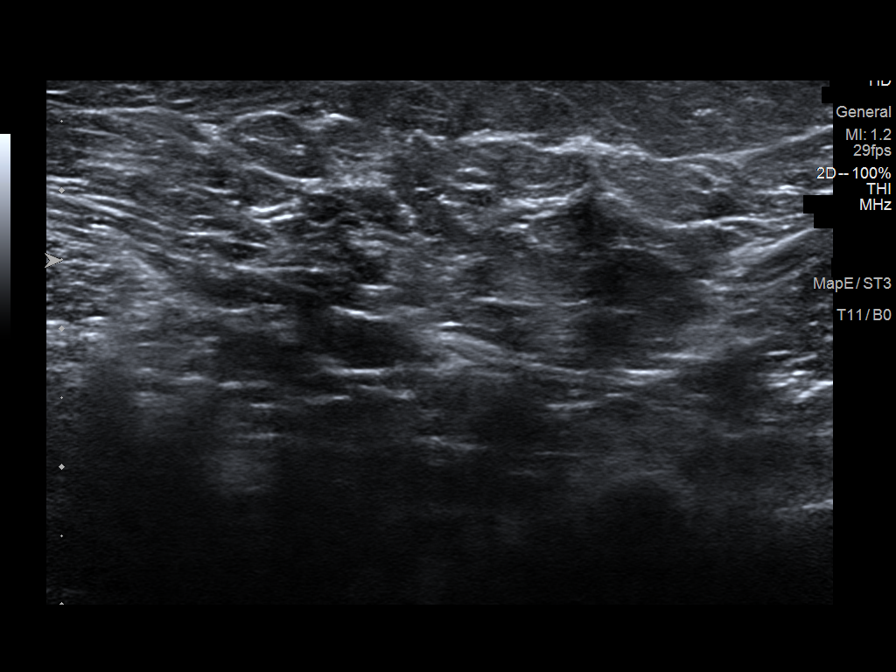
[im 2/8]
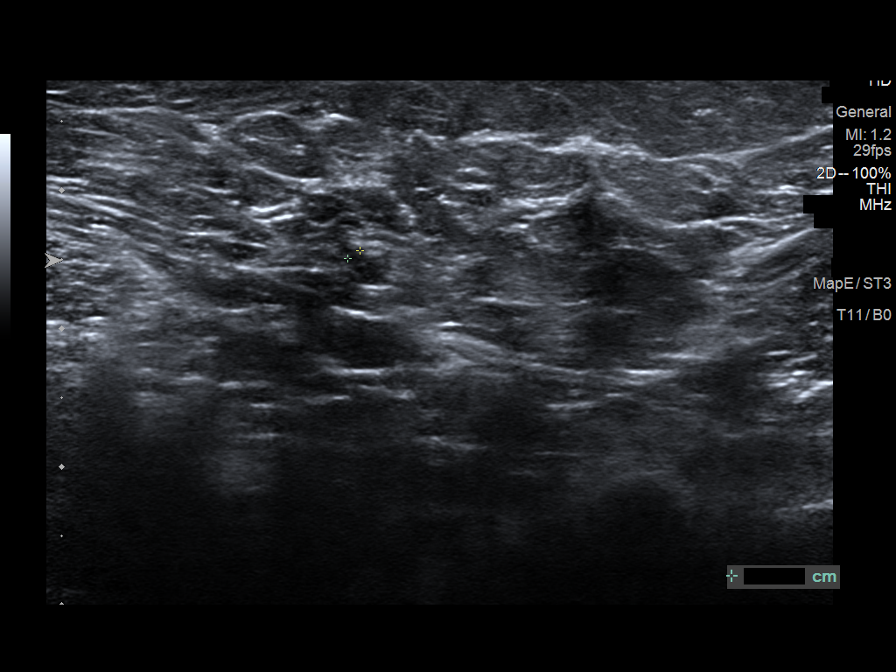
[im 3/8]
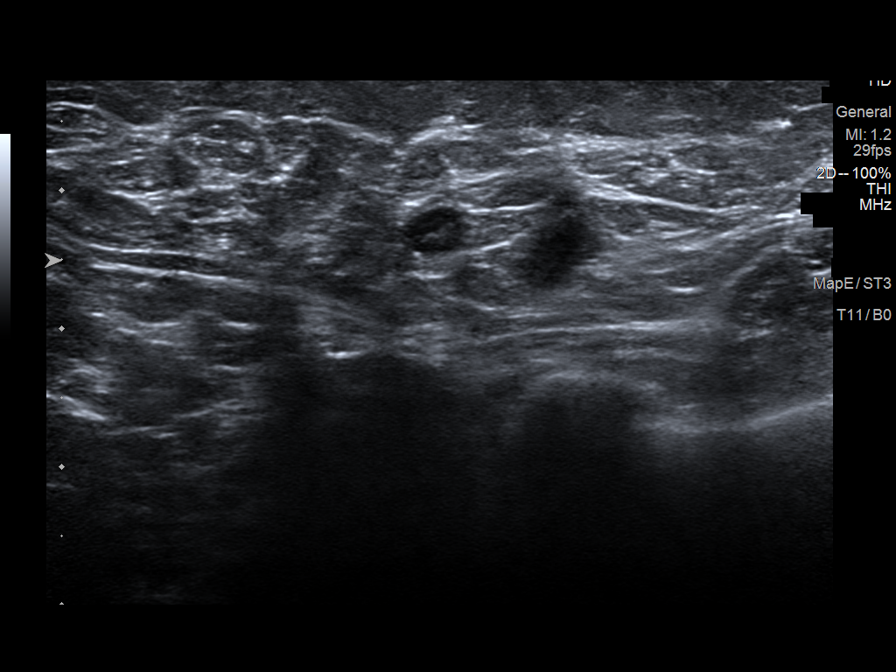
[im 4/8]
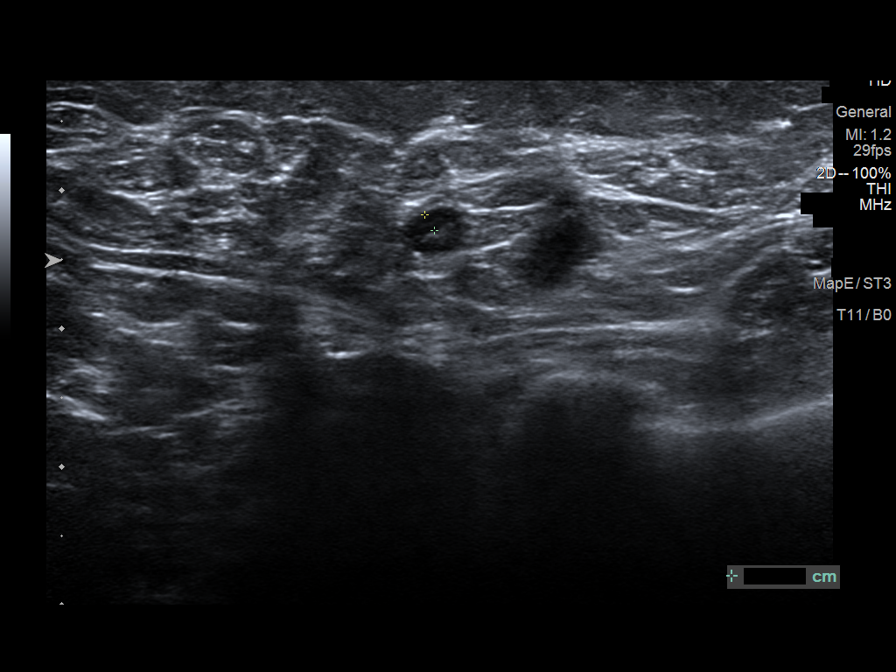
[im 5/8]
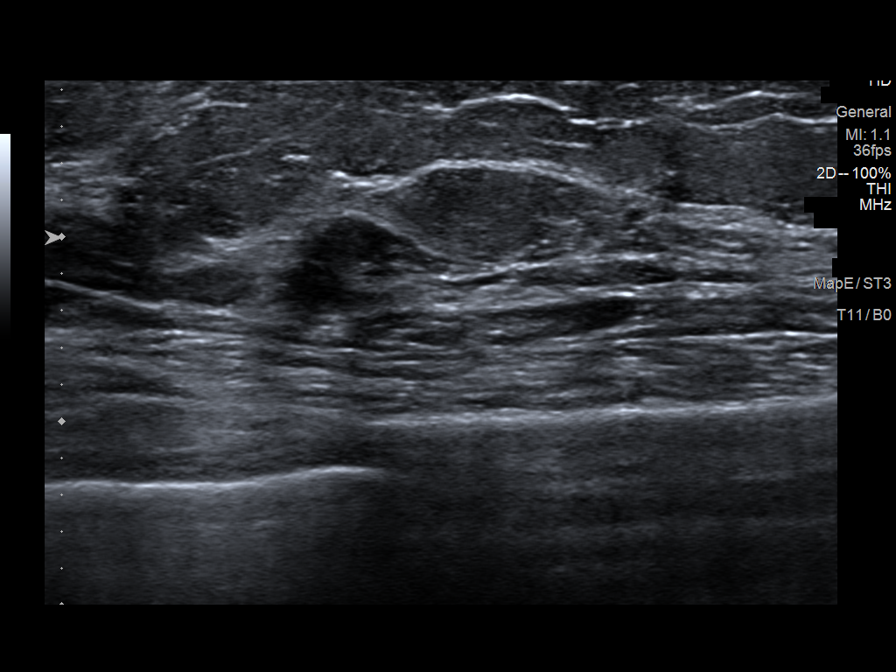
[im 6/8]
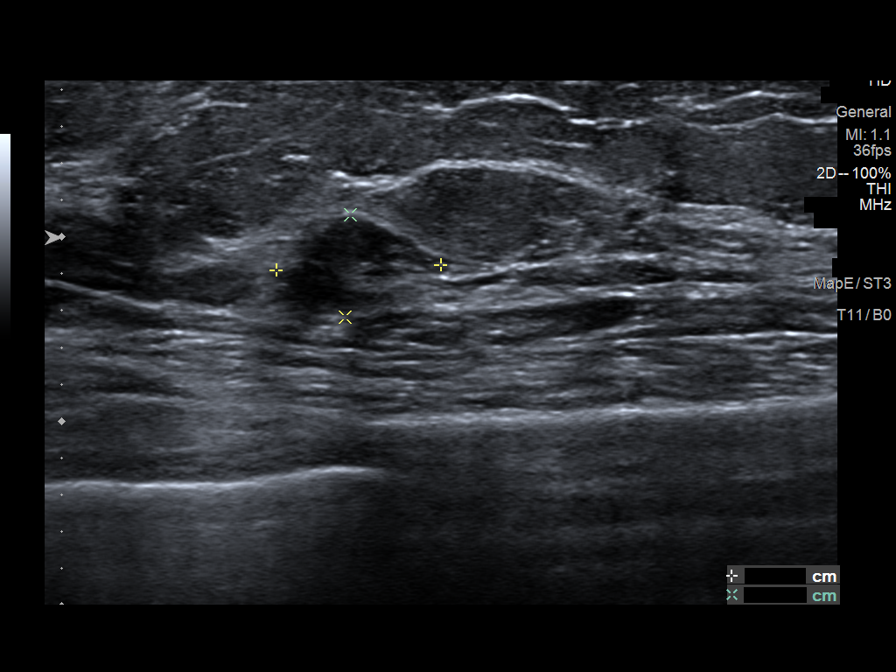
[im 7/8]
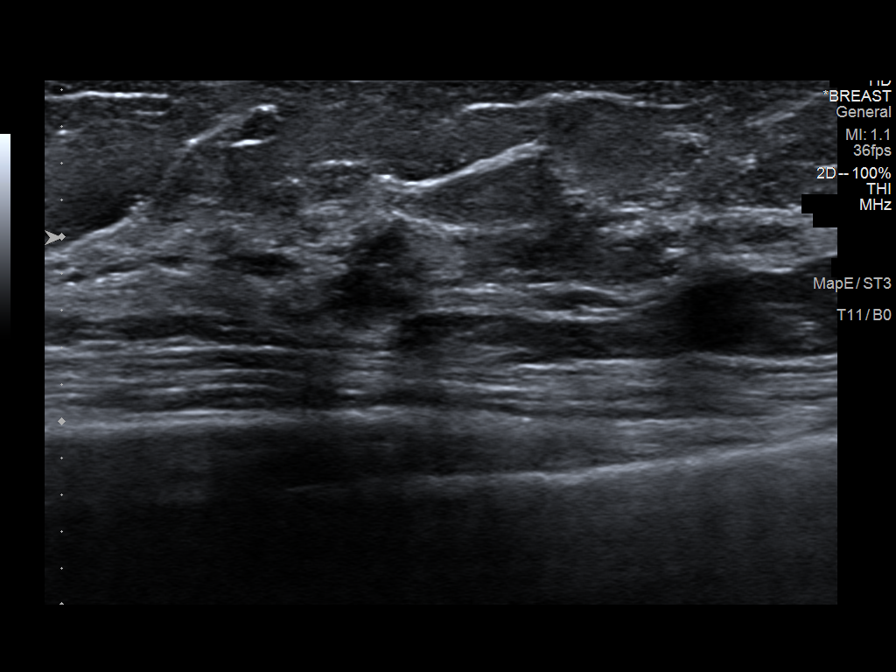
[im 8/8]
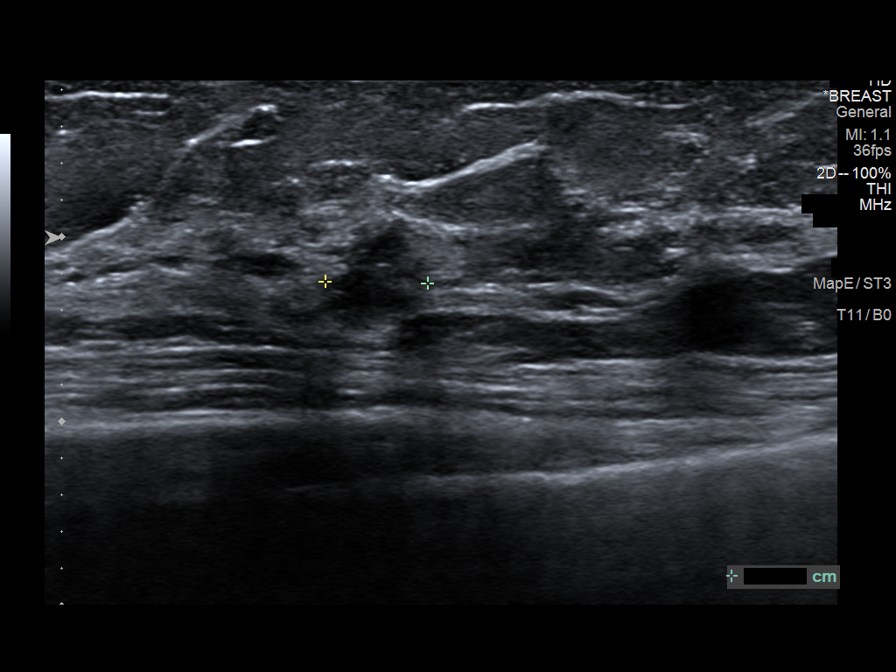

[8 of 8 positions shown; findings below may reference images not displayed]

ACR Breast Density Category c: The breast tissue is heterogeneously
dense, which may obscure small masses.
FINDINGS: Questioned asymmetry within the inferior right breast middle depth
partially effaced with additional imaging suggestive of dense
fibroglandular tissue.

Targeted ultrasound is performed, showing a 9 x 6 x 6 mm probable
solid and cystic mass right breast 8 o'clock position 4 cm from
nipple. No right axillary adenopathy. This is likely an incidental
finding. Normal dense tissue is demonstrated within the inferior
right breast.
IMPRESSION: Indeterminate probable solid and cystic mass right breast 8 o'clock
position 4 cm from nipple, likely incidentally identified on
ultrasound.

RECOMMENDATION:
Ultrasound-guided core needle biopsy right breast mass 8 o'clock
position.

I have discussed the findings and recommendations with the patient.
If applicable, a reminder letter will be sent to the patient
regarding the next appointment.

BI-RADS CATEGORY  4: Suspicious.

## 2022-07-05 ENCOUNTER — Ambulatory Visit: Payer: BC Managed Care – PPO | Admitting: Neurology

## 2022-07-07 DIAGNOSIS — H4710 Unspecified papilledema: Secondary | ICD-10-CM | POA: Diagnosis not present

## 2022-07-07 DIAGNOSIS — Z961 Presence of intraocular lens: Secondary | ICD-10-CM | POA: Diagnosis not present

## 2022-07-07 DIAGNOSIS — H04123 Dry eye syndrome of bilateral lacrimal glands: Secondary | ICD-10-CM | POA: Diagnosis not present

## 2022-08-15 NOTE — Progress Notes (Signed)
[facility] Note  08/23/2022     CHIEF COMPLAINT Patient presents for Retina Follow Up   HISTORY OF PRESENT ILLNESS: Courtney [name] is a 72 y.o. female who presents to the clinic today for:   HPI     Retina Follow Up   Patient presents with  Other.  In left eye.  This started 3 months ago.  I, the attending physician,  performed the HPI with the patient and updated documentation appropriately.        Comments   Patient here for 3 months retina follow up for operculated hole OS. Patient states vision doing well. Has improved since last visit. Doesn't have post laser light anymore. No eye pain. Has surface pain related to outer area of eye from sun damage. Seeing someone at [facility] in about 6 months. Still taking diamox.      Last edited by Bernarda Caffey, MD on 08/23/2022  8:10 PM.    Pt states she is doing well, she is still on diamox 500 once a day per Dr. Danise Mina at [facility], she states she has a spinal stimulator and since starting diamox, the pain has subsided significantly  Referring physician: Jola Schmidt, MD [place],   [id number]  HISTORICAL INFORMATION:   Selected notes from the MEDICAL RECORD NUMBER Referred by Dr. Valetta Close for concern of operculated tear OS LEE:  Ocular Hx- PMH-    CURRENT MEDICATIONS: Current Outpatient Medications (Ophthalmic Drugs)  Medication Sig   Artificial Tear Solution (GENTEAL TEARS OP) Place 1 drop into both eyes daily as needed (dry eyes).   Carboxymethylcellul-Glycerin (REFRESH OPTIVE OP) Place 1 drop into both eyes daily as needed (dry eyes).   No current facility-administered medications for this visit. (Ophthalmic Drugs)   Current Outpatient Medications (Other)  Medication Sig   acetaminophen (TYLENOL) 500 MG tablet Take 500 mg by mouth in the morning, at noon, and at bedtime.   acetaZOLAMIDE ER (DIAMOX) 500 MG capsule Take 500 mg by mouth at bedtime.   Ascorbic Acid (VITAMIN  C) 1000 MG tablet Take 1,000 mg by mouth in the morning and at bedtime.   atorvastatin (LIPITOR) 10 MG tablet Take 10 mg by mouth daily.   CALCIUM CITRATE PO Take 3,000 mg by mouth daily.   cholecalciferol (VITAMIN D3) 25 MCG (1000 UNIT) tablet Take 1,000 Units by mouth daily.   clobetasol cream (TEMOVATE) [id number] % Apply 1 Application topically 2 (two) times a week.   Coenzyme Q10 300 MG CAPS Take 300 mg by mouth every 3 (three) days.   diclofenac Sodium (VOLTAREN) 1 % GEL Apply topically as needed.   DULoxetine (CYMBALTA) 30 MG capsule Take 30 mg by mouth daily after breakfast.    EPINEPHrine (EPIPEN JR) 0.15 MG/0.3ML injection Inject 0.15 mg into the muscle as needed for anaphylaxis.   estradiol (ESTRACE) 0.1 MG/GM vaginal cream Place 1 Applicatorful vaginally at bedtime.   famotidine (PEPCID) 20 MG tablet Take 20 mg by mouth at bedtime.   FERROUS BISGLYCINATE CHELATE PO Take 25 mg by mouth daily.    ferrous sulfate 325 (65 FE) MG tablet Take 325 mg by mouth 2 (two) times daily.   fluticasone (FLONASE) 50 MCG/ACT nasal spray Place 1 spray into both nostrils at bedtime.    GEMTESA 75 MG TABS Take 1 tablet by mouth daily.   levothyroxine (SYNTHROID, LEVOTHROID) 25 MCG tablet Take 1 1/2 tablets daily (Patient taking differently: Take 37.5 mcg by  mouth daily before breakfast. Take 1 1/2 tablets daily)   meloxicam (MOBIC) 15 MG tablet Take 15 mg by mouth daily.   methocarbamol (ROBAXIN) 500 MG tablet Take 500 mg by mouth 2 (two) times daily.   naproxen (NAPROSYN) 250 MG tablet Take 250 mg by mouth 2 (two) times daily with a meal.   Oxycodone HCl 10 MG TABS Take 10 mg by mouth 4 (four) times daily as needed.   polyethylene glycol (MIRALAX / GLYCOLAX) 17 g packet Take 17 g by mouth daily.   PRESCRIPTION MEDICATION Tyrvaya -- Nasal Spray -- used for dry eyes   psyllium (REGULOID) 0.52 g capsule Take 0.52 g by mouth in the morning and at bedtime.   Tart Cherry 1200 MG CAPS Take 1,200 mg by mouth daily.    traZODone (DESYREL) 100 MG tablet Take 50 mg by mouth at bedtime.   VITAMIN A PO Take by mouth in the morning, at noon, and at bedtime.   No current facility-administered medications for this visit. (Other)   REVIEW OF SYSTEMS: ROS   Positive for: Neurological, Musculoskeletal, Cardiovascular, Eyes Negative for: Constitutional, Gastrointestinal, Skin, Genitourinary, HENT, Endocrine, Respiratory, Psychiatric, Allergic/Imm, Heme/Lymph Last edited by Theodore Demark, COA on 08/23/2022  1:34 PM.     ALLERGIES Allergies  Allergen Reactions   Fish Allergy Anaphylaxis    Can tolerate shellfish (can eat tuna, salmon, trout, swordfish and redrum). Allergic to fish with scales and fins.   Peanut Butter Flavor Anaphylaxis    Allergic to peanuts and some tree nuts. Can eat cashews, pistachios, and almonds.    Other Other (See Comments)    Unknown.  Tree nuts -- most but not all   PAST MEDICAL HISTORY Past Medical History:  Diagnosis Date   ADHD    ADD   Allergic to pets    pet dander   Allergies    Anemia    take iron   Anxiety    Arthritis    hands, hip, shoulders back,   Asthma    as a child   Bicuspid aortic valve    bicuspid AV with mild AS, ascending aorta 3.40 cm [id number] echo   Complication of anesthesia    slow to wake up and lingers for a long time   Concussion    Depression    Family history of adverse reaction to anesthesia    mom had skin reaction to anesthesia but cant remember what it was or exactly what happened   Fatigue    Fibromyalgia    GERD (gastroesophageal reflux disease)    Hyperlipidemia    Hypothyroidism    Irregular periods    Joint pain    Joint stiffness    Lower back pain    Menopause    Migraines    no longer   Mitral regurgitation    Numbness and tingling    Pollen allergy    Presence of pessary    Ringing in ears    Sleep apnea    does not wear CPAP tapes mouth at night   Snoring    Past Surgical History:  Procedure Laterality  Date   ABDOMINAL EXPOSURE N/A 04/12/2020   Procedure: ABDOMINAL EXPOSURE;  Surgeon: [name], MD;  Location: [facility];  Service: Vascular;  Laterality: N/A;   ABDOMINOPLASTY     ANTERIOR CERVICAL DECOMP/DISCECTOMY FUSION N/A 06/28/2016   Procedure: ACDF C5-7 ANTERIOR CERVICAL DECOMPRESSION/DISCECTOMY FUSION 2 LEVELS;  Surgeon: Melina Schools, MD;  Location: [place];  Service: Orthopedics;  Laterality: N/A;  Requests 3 hrs   ANTERIOR LUMBAR FUSION N/A 04/12/2020   Procedure: Anterior Lumbar Interbody Fusion - Lumbar four-Lumbar five , posterior instrumented fusion Lumbar four-five lumbar three -four;  Surgeon: Eustace Moore, MD;  Location: [place];  Service: Neurosurgery;  Laterality: N/A;   BREAST BIOPSY Right 09/19/2021   CARPAL TUNNEL RELEASE Bilateral 1996   CATARACT EXTRACTION Bilateral    03/08/22 and 03/15/2022   COLONOSCOPY     DENTAL SURGERY     HERNIA REPAIR     times 2   LAMINECTOMY WITH POSTERIOR LATERAL ARTHRODESIS LEVEL 1 N/A 04/12/2020   Procedure: Posterior Instrumented Fusion Lumbar three to lumbar five;  Surgeon: Eustace Moore, MD;  Location: [place];  Service: Neurosurgery;  Laterality: N/A;   LUMBAR LAMINECTOMY/DECOMPRESSION MICRODISCECTOMY  04/21/2015   Procedure: DECOMPRESSION LUMBAR THREE-LUMBAR FOUR;  Surgeon: Melina Schools, MD;  Location: [place];  Service: Orthopedics;;   LUMBAR LAMINECTOMY/DECOMPRESSION MICRODISCECTOMY N/A 06/20/2017   Procedure: L4-5 decompression, L5-S1 left laminotomy/foraminotomy;  Surgeon: Melina Schools, MD;  Location: [place];  Service: Orthopedics;  Laterality: N/A;  3 hrs   PARATHYROIDECTOMY  2012   RADIOACTIVE SEED GUIDED EXCISIONAL BREAST BIOPSY Right 10/28/2021   Procedure: RADIOACTIVE SEED GUIDED EXCISIONAL RIGHT BREAST BIOPSY;  Surgeon: Stark Klein, MD;  Location: [place];  Service: General;  Laterality: Right;   SACROILIAC JOINT FUSION Right 07/24/2018   Procedure: SACROILIAC JOINT FUSION;  Surgeon: Melina Schools,  MD;  Location: [place];  Service: Orthopedics;  Laterality: Right;  90 mins   SACROILIAC JOINT FUSION Left 01/12/2022   Procedure: SPINAL CORD STIMULATOR PLACEMENT, LEFT SACROILIAC FUSION;  Surgeon: Melina Schools, MD;  Location: [place];  Service: Orthopedics;  Laterality: Left;   SHOULDER ARTHROSCOPY W/ ACROMIAL REPAIR Right    SPINAL CORD STIMULATOR INSERTION Left 01/12/2022   Procedure: LUMBAR SPINAL CORD STIMULATOR INSERTION;  Surgeon: Melina Schools, MD;  Location: [place];  Service: Orthopedics;  Laterality: Left;   TONSILLECTOMY     TOTAL HIP ARTHROPLASTY Right 11/10/2020   Procedure: TOTAL HIP ARTHROPLASTY ANTERIOR APPROACH;  Surgeon: Gaynelle Arabian, MD;  Location: [facility];  Service: Orthopedics;  Laterality: Right;  153mn   FAMILY HISTORY Family History  Problem Relation Age of Onset   COPD Mother    Cancer Mother    Hypertension Mother    Hyperlipidemia Mother    Heart disease Mother    Thyroid disease Mother    Thyroid disease Sister    Thyroid disease Daughter    Pseudotumor cerebri Neg Hx    SOCIAL HISTORY Social History   Tobacco Use   Smoking status: Former    Packs/day: 0.25    Years: 4.00    Total pack years: 1.00    Types: Cigarettes    Quit date: 1976    Years since quitting: 48.2   Smokeless tobacco: Never   Tobacco comments:    social smoker stop at age 72 Vaping Use   Vaping Use: Never used  Substance Use Topics   Alcohol use: Yes    Alcohol/week: 1.0 - 2.0 standard drink of alcohol    Types: 1 - 2 Glasses of wine per week    Comment: socially   Drug use: No       OPHTHALMIC EXAM:  Base Eye Exam     Visual Acuity (Snellen - Linear)       Right Left   Dist [place] 20/40 20/25 -2   Dist ph [place]  20/25 20/20         Tonometry (Tonopen, 1:30 PM)       Right Left   Pressure 12 10         Pupils       Dark Light Shape React APD   Right 2 2 Round Minimal None   Left 2 2 Round Minimal None         Visual Fields (Counting fingers)        Left Right    Full Full         Extraocular Movement       Right Left    Full, Ortho Full, Ortho         Neuro/Psych     Oriented x3: Yes   Mood/Affect: Normal         Dilation     Both eyes: 1.0% Mydriacyl, 2.5% Phenylephrine @ 1:30 PM           Slit Lamp and Fundus Exam     Slit Lamp Exam       Right Left   Lids/Lashes Dermatochalasis - upper lid, mild MGD Dermatochalasis - upper lid, mild MGD, incomplete blink   Conjunctiva/Sclera White and quiet White and quiet   Cornea 2+ Punctate epithelial erosions, well healed cataract wound, tear film debris 3+ Punctate epithelial erosions greatest inferiorly, arcus, well healed cataract wound, decreased TBUT   Anterior Chamber deep and clear deep and clear   Iris Round and dilated Round and dilated   Lens PC IOL in good position PC IOL in good position   Anterior Vitreous Vitreous syneresis, Posterior vitreous detachment Vitreous syneresis, no pigment, Posterior vitreous detachment, operculum ST periphery         Fundus Exam       Right Left   Disc Pink and Sharp, mild tilt, focal PPP temporal, +elevation -- improved Pink, +elevation/edema greatest superior quad -- improved, mild blurring of margins   C/D Ratio 0.2 0.2   Macula Flat, good foveal reflex, mild RPE mottling, No heme or edema Flat, good foveal reflex, mild RPE mottling, No heme or edema   Vessels attenuated, Tortuous attenuated, Tortuous   Periphery Attached, No heme Attached, pigmented lattice with VR tuft at 0430, operculated hole at 0130 ora -- good laser surrounding all lesions, No heme, no new RT/RD/lattice           IMAGING AND PROCEDURES  Imaging and Procedures for 08/23/2022  OCT, Retina - OU - Both Eyes       Right Eye Quality was good. Central Foveal Thickness: 261. Progression has improved. Findings include normal foveal contour, no IRF, no SRF (mild interval improvement in disc edema).   Left Eye Quality was good. Central Foveal  Thickness: 271. Progression has improved. Findings include normal foveal contour, no IRF, no SRF (interval improvement in disc edema).   Notes *Images captured and stored on drive  Diagnosis / Impression:  NFP, no IRF/SRF OU interval improvement in disc edema OU  Clinical management:  See below  Abbreviations: NFP - Normal foveal profile. CME - cystoid macular edema. PED - pigment epithelial detachment. IRF - intraretinal fluid. SRF - subretinal fluid. EZ - ellipsoid zone. ERM - epiretinal membrane. ORA - outer retinal atrophy. ORT - outer retinal tubulation. SRHM - subretinal hyper-reflective material. IRHM - intraretinal hyper-reflective material            ASSESSMENT/PLAN:    ICD-10-CM   1. Retinal hole of left eye  H33.322  2. Lattice degeneration of left retina  H35.412     3. Vitreoretinal tuft of left eye  Q14.1     4. Optic disc edema  H47.10 OCT, Retina - OU - Both Eyes    5. Pseudophakia, both eyes  Z96.1      1. Operculated retinal hole, OS   - operculated hole at 0130 ora - s/p indirect laser retinopexy OS (10.24.23) -- good laser in place - no new RT/RD or lattice OS - f/u 1 year -- DFE, OCT  2,3. Lattice degeneration w/ VR tuft, left eye - pigmented lattice with VR tuft at 0430 - s/p laser retinopexy OS as above -- good laser in place - f/u 3 mos - DFE, OCT  4. Optic disc edema OU (OS > OD) -- improving  - now under the expert management of Dr. Danise Mina, Neuro[facility], at [facility]  - currently on Diamox  - interval decrease in disc edema / elevation OU on exam and OCT today  - BCVA remains 20/20 OU  - pt reports history of lower back / spine / nerve issues -- has a spinal cord stimulator -- improved since initiating diamox  - pt had MRI orbits and LP done emergently on 11.9.23 due to worsening disc edema   MRI orbits showed findings suggesting of IIH and papilledema   LP showed opening pressure of 22cm H2O -- slightly elevated  - 11.29.23 -- pt saw Dr.  Jaynee Eagles, Neurologist at [facility] who advised stoppage of Vit A derivative meds  - 12.1.23 -- pt saw Dr. Danise Mina, Neuro[facility] at [facility] and had a follow up on 01.26.24  - Disc edema may be relative IIH w/ mildly elevated opening pressure and low IOP - Started on 500 mg diamox daily  5. Pseudophakia OU  - s/p CE/IOL (Dr. Valetta Close, OD: 09.23.23, OS: 10.04.23)  - IOL in good position, doing well  - monitor  6. Dry eyes OU - recommend artificial tears and lubricating ointment as needed   Ophthalmic Meds Ordered this visit:  No orders of the defined types were placed in this encounter.    Return in about 1 year (around 08/23/2023) for f/u operculated hole OS, DFE, OCT.  There are no Patient Instructions on file for this visit.  Explained the diagnoses, plan, and follow up with the patient and they expressed understanding.  Patient expressed understanding of the importance of proper follow up care.   This document serves as a record of services personally performed by Gardiner Sleeper, MD, PhD. It was created on their behalf by Orvan Falconer, an ophthalmic technician. The creation of this record is the provider's dictation and/or activities during the visit.    Electronically signed by: Orvan Falconer, OA, 08/23/22  8:12 PM  This document serves as a record of services personally performed by Gardiner Sleeper, MD, PhD. It was created on their behalf by San Jetty. Owens Shark, OA an ophthalmic technician. The creation of this record is the provider's dictation and/or activities during the visit.    Electronically signed by: San Jetty. Owens Shark, [place] 03.13.2024 8:12 PM  Gardiner Sleeper, M.D., Ph.D. Diseases & Surgery of the Retina and Vitreous [facility]  I have reviewed the above documentation for accuracy and completeness, and I agree with the above. Gardiner Sleeper, M.D., Ph.D. 08/23/22 8:31 PM   Abbreviations: M myopia (nearsighted); A astigmatism; H hyperopia  (farsighted); P presbyopia; Mrx spectacle prescription;  CTL contact lenses; OD right eye;  OS left eye; OU both eyes  XT exotropia; ET esotropia; PEK punctate epithelial keratitis; PEE punctate epithelial erosions; DES dry eye syndrome; MGD meibomian gland dysfunction; ATs artificial tears; PFAT's preservative free artificial tears; [place] nuclear sclerotic cataract; PSC posterior subcapsular cataract; ERM epi-retinal membrane; PVD posterior vitreous detachment; RD retinal detachment; DM diabetes mellitus; DR diabetic retinopathy; NPDR non-proliferative diabetic retinopathy; PDR proliferative diabetic retinopathy; CSME clinically significant macular edema; DME diabetic macular edema; dbh dot blot hemorrhages; CWS cotton wool spot; POAG primary open angle glaucoma; C/D cup-to-disc ratio; HVF humphrey visual field; GVF goldmann visual field; OCT optical coherence tomography; IOP intraocular pressure; BRVO Branch retinal vein occlusion; CRVO central retinal vein occlusion; CRAO central retinal artery occlusion; BRAO branch retinal artery occlusion; RT retinal tear; SB scleral buckle; PPV pars plana vitrectomy; VH Vitreous hemorrhage; PRP panretinal laser photocoagulation; IVK intravitreal kenalog; VMT vitreomacular traction; MH Macular hole;  NVD neovascularization of the disc; NVE neovascularization elsewhere; AREDS age related eye disease study; ARMD age related macular degeneration; POAG primary open angle glaucoma; EBMD epithelial/anterior basement membrane dystrophy; ACIOL anterior chamber intraocular lens; IOL intraocular lens; PCIOL posterior chamber intraocular lens; Phaco/IOL phacoemulsification with intraocular lens placement; [place] photorefractive keratectomy; LASIK laser assisted in situ keratomileusis; HTN hypertension; DM diabetes mellitus; COPD chronic obstructive pulmonary disease

## 2022-08-23 ENCOUNTER — Ambulatory Visit (INDEPENDENT_AMBULATORY_CARE_PROVIDER_SITE_OTHER): Payer: Medicare Other | Admitting: Ophthalmology

## 2022-08-23 ENCOUNTER — Encounter (INDEPENDENT_AMBULATORY_CARE_PROVIDER_SITE_OTHER): Payer: Self-pay | Admitting: Ophthalmology

## 2022-08-23 DIAGNOSIS — H4710 Unspecified papilledema: Secondary | ICD-10-CM | POA: Diagnosis not present

## 2022-08-23 DIAGNOSIS — Z961 Presence of intraocular lens: Secondary | ICD-10-CM | POA: Diagnosis not present

## 2022-08-23 DIAGNOSIS — Q141 Congenital malformation of retina: Secondary | ICD-10-CM

## 2022-08-23 DIAGNOSIS — H04123 Dry eye syndrome of bilateral lacrimal glands: Secondary | ICD-10-CM

## 2022-08-23 DIAGNOSIS — H33322 Round hole, left eye: Secondary | ICD-10-CM | POA: Diagnosis not present

## 2022-08-23 DIAGNOSIS — H35412 Lattice degeneration of retina, left eye: Secondary | ICD-10-CM

## 2022-08-29 DIAGNOSIS — M5451 Vertebrogenic low back pain: Secondary | ICD-10-CM | POA: Diagnosis not present

## 2022-09-15 DIAGNOSIS — M961 Postlaminectomy syndrome, not elsewhere classified: Secondary | ICD-10-CM | POA: Diagnosis not present

## 2022-09-29 DIAGNOSIS — H4710 Unspecified papilledema: Secondary | ICD-10-CM | POA: Diagnosis not present

## 2022-10-25 DIAGNOSIS — G629 Polyneuropathy, unspecified: Secondary | ICD-10-CM | POA: Diagnosis not present

## 2022-10-25 DIAGNOSIS — R7303 Prediabetes: Secondary | ICD-10-CM | POA: Diagnosis not present

## 2022-10-25 DIAGNOSIS — I700 Atherosclerosis of aorta: Secondary | ICD-10-CM | POA: Diagnosis not present

## 2022-10-25 DIAGNOSIS — R7309 Other abnormal glucose: Secondary | ICD-10-CM | POA: Diagnosis not present

## 2022-10-25 DIAGNOSIS — N8110 Cystocele, unspecified: Secondary | ICD-10-CM | POA: Diagnosis not present

## 2022-10-25 DIAGNOSIS — H4710 Unspecified papilledema: Secondary | ICD-10-CM | POA: Diagnosis not present

## 2022-10-25 DIAGNOSIS — Z0000 Encounter for general adult medical examination without abnormal findings: Secondary | ICD-10-CM | POA: Diagnosis not present

## 2022-10-25 DIAGNOSIS — M4800 Spinal stenosis, site unspecified: Secondary | ICD-10-CM | POA: Diagnosis not present

## 2022-10-25 DIAGNOSIS — E7800 Pure hypercholesterolemia, unspecified: Secondary | ICD-10-CM | POA: Diagnosis not present

## 2022-10-25 DIAGNOSIS — K219 Gastro-esophageal reflux disease without esophagitis: Secondary | ICD-10-CM | POA: Diagnosis not present

## 2022-10-25 DIAGNOSIS — G932 Benign intracranial hypertension: Secondary | ICD-10-CM | POA: Diagnosis not present

## 2022-10-25 DIAGNOSIS — G4733 Obstructive sleep apnea (adult) (pediatric): Secondary | ICD-10-CM | POA: Diagnosis not present

## 2022-10-25 DIAGNOSIS — E039 Hypothyroidism, unspecified: Secondary | ICD-10-CM | POA: Diagnosis not present

## 2022-10-27 ENCOUNTER — Other Ambulatory Visit: Payer: Self-pay | Admitting: Internal Medicine

## 2022-10-27 DIAGNOSIS — Z1231 Encounter for screening mammogram for malignant neoplasm of breast: Secondary | ICD-10-CM

## 2022-11-02 ENCOUNTER — Other Ambulatory Visit: Payer: Self-pay | Admitting: Internal Medicine

## 2022-11-02 DIAGNOSIS — M85852 Other specified disorders of bone density and structure, left thigh: Secondary | ICD-10-CM

## 2022-11-16 ENCOUNTER — Ambulatory Visit
Admission: RE | Admit: 2022-11-16 | Discharge: 2022-11-16 | Disposition: A | Discharge status: Home / Self Care | Payer: Medicare Other | Source: Ambulatory Visit | Attending: Internal Medicine | Admitting: Internal Medicine

## 2022-11-16 DIAGNOSIS — Z1231 Encounter for screening mammogram for malignant neoplasm of breast: Secondary | ICD-10-CM | POA: Diagnosis not present

## 2022-11-20 DIAGNOSIS — H04123 Dry eye syndrome of bilateral lacrimal glands: Secondary | ICD-10-CM | POA: Diagnosis not present

## 2022-11-23 DIAGNOSIS — Z1211 Encounter for screening for malignant neoplasm of colon: Secondary | ICD-10-CM | POA: Diagnosis not present

## 2022-12-18 DIAGNOSIS — H43813 Vitreous degeneration, bilateral: Secondary | ICD-10-CM | POA: Diagnosis not present

## 2022-12-18 DIAGNOSIS — H04123 Dry eye syndrome of bilateral lacrimal glands: Secondary | ICD-10-CM | POA: Diagnosis not present

## 2023-01-03 DIAGNOSIS — H04123 Dry eye syndrome of bilateral lacrimal glands: Secondary | ICD-10-CM | POA: Diagnosis not present

## 2023-01-03 DIAGNOSIS — Z961 Presence of intraocular lens: Secondary | ICD-10-CM | POA: Diagnosis not present

## 2023-01-03 DIAGNOSIS — H4710 Unspecified papilledema: Secondary | ICD-10-CM | POA: Diagnosis not present

## 2023-01-10 DIAGNOSIS — L814 Other melanin hyperpigmentation: Secondary | ICD-10-CM | POA: Diagnosis not present

## 2023-01-10 DIAGNOSIS — L821 Other seborrheic keratosis: Secondary | ICD-10-CM | POA: Diagnosis not present

## 2023-01-10 DIAGNOSIS — D225 Melanocytic nevi of trunk: Secondary | ICD-10-CM | POA: Diagnosis not present

## 2023-01-10 DIAGNOSIS — L578 Other skin changes due to chronic exposure to nonionizing radiation: Secondary | ICD-10-CM | POA: Diagnosis not present

## 2023-01-10 DIAGNOSIS — L658 Other specified nonscarring hair loss: Secondary | ICD-10-CM | POA: Diagnosis not present

## 2023-01-22 ENCOUNTER — Other Ambulatory Visit: Payer: Self-pay | Admitting: Family Medicine

## 2023-01-22 DIAGNOSIS — M47816 Spondylosis without myelopathy or radiculopathy, lumbar region: Secondary | ICD-10-CM

## 2023-01-22 DIAGNOSIS — M5416 Radiculopathy, lumbar region: Secondary | ICD-10-CM

## 2023-02-14 ENCOUNTER — Other Ambulatory Visit: Payer: Self-pay | Admitting: Family Medicine

## 2023-02-14 DIAGNOSIS — M47816 Spondylosis without myelopathy or radiculopathy, lumbar region: Secondary | ICD-10-CM

## 2023-02-19 ENCOUNTER — Other Ambulatory Visit: Payer: Self-pay | Admitting: Oncology

## 2023-02-19 DIAGNOSIS — Z006 Encounter for examination for normal comparison and control in clinical research program: Secondary | ICD-10-CM

## 2023-03-05 ENCOUNTER — Other Ambulatory Visit: Payer: Self-pay | Admitting: Orthopedic Surgery

## 2023-03-05 DIAGNOSIS — M48062 Spinal stenosis, lumbar region with neurogenic claudication: Secondary | ICD-10-CM

## 2023-03-16 ENCOUNTER — Encounter: Payer: Self-pay | Admitting: Orthopedic Surgery

## 2023-03-19 ENCOUNTER — Telehealth: Payer: Self-pay | Admitting: *Deleted

## 2023-03-19 NOTE — Telephone Encounter (Signed)
Pt has been scheduled to see Tereso Newcomer, [facility] 04/11/23 @ 8:50 for pre op clearance. I will update all parties involved.

## 2023-03-19 NOTE — Telephone Encounter (Signed)
Name: Courtney [name]  DOB: 24-Sep-1950  MRN: [id number]  Primary Cardiologist: Kristeen Miss, MD  Chart reviewed as part of pre-operative protocol coverage. Because of Chasitie Ritchie Lavallie's past medical history and time since last visit, she will require a follow-up in-office visit in order to better assess preoperative cardiovascular risk.Has not been seen since 2022 by Dr. Elease Hashimoto.   Pre-op covering staff: - Please schedule appointment and call patient to inform them. If patient already had an upcoming appointment within acceptable timeframe, please add "pre-op clearance" to the appointment notes so provider is aware. - Please contact requesting surgeon's office via preferred method (i.e, phone, fax) to inform them of need for appointment prior to surgery.   Joni Reining, NP  03/19/2023, 11:38 AM

## 2023-03-19 NOTE — Telephone Encounter (Signed)
Pre-operative Risk Assessment    Patient Name: Courtney [name]  DOB: [birth year] MRN: [id number]    DATE OF LAST VISIT: 08/23/21 Robin Searing, NP DATE OF NEXT VISIT: NONE  Request for Surgical Clearance    Procedure:   LUMBAR FUSION  Date of Surgery:  Clearance TBD                                 Surgeon:  DR. Malachy Chamber Surgeon's Group or Practice Name:  [facility] Phone number:  [phone]  Fax number:  [phone]   Type of Clearance Requested:   - Medical ; NO MEDICATIONS LISTED AS NEEDING TO BE HELD   Type of Anesthesia:  General    Additional requests/questions:    Elpidio Anis   03/19/2023, 10:28 AM

## 2023-03-20 ENCOUNTER — Other Ambulatory Visit: Payer: Medicare Other

## 2023-03-23 ENCOUNTER — Other Ambulatory Visit: Payer: Medicare Other

## 2023-03-29 ENCOUNTER — Ambulatory Visit
Admission: RE | Admit: 2023-03-29 | Discharge: 2023-03-29 | Disposition: A | Discharge status: Home / Self Care | Payer: Medicare Other | Source: Ambulatory Visit | Attending: Orthopedic Surgery | Admitting: Orthopedic Surgery

## 2023-03-29 DIAGNOSIS — M48062 Spinal stenosis, lumbar region with neurogenic claudication: Secondary | ICD-10-CM

## 2023-04-10 ENCOUNTER — Ambulatory Visit
Admission: RE | Admit: 2023-04-10 | Discharge: 2023-04-10 | Disposition: A | Discharge status: Home / Self Care | Payer: Medicare Other | Source: Ambulatory Visit | Attending: Orthopedic Surgery | Admitting: Orthopedic Surgery

## 2023-04-10 DIAGNOSIS — M48062 Spinal stenosis, lumbar region with neurogenic claudication: Secondary | ICD-10-CM

## 2023-04-10 NOTE — Progress Notes (Unsigned)
Cardiology Office Note:    Date:  04/11/2023  ID:  Courtney [name], DOB June 19, 1950, MRN [id number] PCP: Maurice Small, MD (Inactive)  [facility] Providers Cardiologist:  Kristeen Miss, MD       Patient Profile:      Bicuspid aortic valve TTE 09/21/21: EF 60-65, no RWMA, NL RVSF, NL PASP, RVSP 21.5, mild MR, bicuspid AV w mild AS (mean 11, Vmax 231 cm/s, DI 0.47) Hyperlipidemia   OSA          History of Present Illness:  Discussed the use of AI scribe software for clinical note transcription with the patient, who gave verbal consent to proceed.  Courtney [name] is a 72 y.o. female who returns for surgical clearance. She needs a lumbar fusion with Dr. Precious Gilding with gen anesthesia. She was last seen in 08/2021 by Robin Searing, NP.  She is here alone.  She denies experiencing chest pain, shortness of breath, or syncope. The patient has a family history of heart disease, with her mother having had a heart attack and PCI in her mid-seventies. The patient denies any symptoms of heart failure, such as trouble breathing when lying flat, sudden waking due to breathlessness, or swelling in the legs. She is not a smoker.      Review of Systems  Gastrointestinal:  Negative for hematochezia and melena.  Genitourinary:  Negative for hematuria.  See HPI     Studies Reviewed:   EKG Interpretation Date/Time:  Wednesday April 11 2023 08:42:57 EDT Ventricular Rate:  69 PR Interval:  152 QRS Duration:  88 QT Interval:  376 QTC Calculation: 402 R Axis:   40  Text Interpretation: Normal sinus rhythm Normal axis TW inversions 1, aVL Similar to multiple old tracings Confirmed by Tereso Newcomer [phone]) on 04/11/2023 9:23:39 AM    Results   LABS-chart review LDL: 72 (10/2022) Triglycerides: 129 (10/2022) HDL: 41 (10/2022)       Risk Assessment/Calculations:             Physical Exam:   VS:  BP 110/78   Pulse 69   Ht 5\' 4"  (1.626 m)   Wt 131 lb 12.8 oz (59.8 kg)    LMP  (LMP Unknown)   SpO2 96%   BMI 22.62 kg/m    Wt Readings from Last 3 Encounters:  04/11/23 131 lb 12.8 oz (59.8 kg)  05/10/22 144 lb (65.3 kg)  04/20/22 135 lb (61.2 kg)    Constitutional:      Appearance: Healthy appearance. Not in distress.  Neck:     Vascular: No carotid bruit.  Pulmonary:     Breath sounds: Normal breath sounds. No wheezing. No rales.  Cardiovascular:     Normal rate. Regular rhythm.     Murmurs: There is a grade 1/6 systolic murmur at the URSB.  Edema:    Peripheral edema absent.        Assessment and Plan:   Assessment & Plan Preoperative cardiovascular examination [name]'s perioperative risk of a major cardiac event is 0.4% according to the Revised Cardiac Risk Index (RCRI).  Therefore, she is at low risk for perioperative complications.   Her functional capacity is fair at 4.4 METs according to the Duke Activity Status Index (DASI). Recommendations: According to ACC/AHA guidelines, no further cardiovascular testing needed.  The patient may proceed to surgery at acceptable risk.   Bicuspid aortic valve No symptoms suggestive of significant worsening. Last echocardiogram in April 2023 showed mild aortic stenosis with  a mean gradient of 11. -Plan for follow-up in two years with an echocardiogram prior to the visit for surveillance. Family history of early CAD She has a family history of CAD.  We discussed the possibility of proceeding with coronary artery calcium score.  She is interested in this and we will plan to get it after she has recovered from her back surgery. Pure hypercholesterolemia LDL optimal.  Continue Lipitor 10 mg daily. If she has a high calcium score, will adjust statin therapy to a goal LDL of <70.          Dispo:  Return in about 2 years (around 04/10/2025) for Routine Follow Up, w/ Dr. Elease Hashimoto.  Signed, Tereso Newcomer, PA-C

## 2023-04-11 ENCOUNTER — Encounter: Payer: Self-pay | Admitting: Physician Assistant

## 2023-04-11 ENCOUNTER — Ambulatory Visit: Payer: Medicare Other | Attending: Physician Assistant | Admitting: Physician Assistant

## 2023-04-11 VITALS — BP 110/78 | HR 69 | Ht 64.0 in | Wt 131.8 lb

## 2023-04-11 DIAGNOSIS — E7800 Pure hypercholesterolemia, unspecified: Secondary | ICD-10-CM

## 2023-04-11 DIAGNOSIS — Q2381 Bicuspid aortic valve: Secondary | ICD-10-CM

## 2023-04-11 DIAGNOSIS — Z8249 Family history of ischemic heart disease and other diseases of the circulatory system: Secondary | ICD-10-CM | POA: Insufficient documentation

## 2023-04-11 DIAGNOSIS — Z01810 Encounter for preprocedural cardiovascular examination: Secondary | ICD-10-CM

## 2023-04-11 DIAGNOSIS — I350 Nonrheumatic aortic (valve) stenosis: Secondary | ICD-10-CM | POA: Insufficient documentation

## 2023-04-11 NOTE — Assessment & Plan Note (Signed)
She has a family history of CAD.  We discussed the possibility of proceeding with coronary artery calcium score.  She is interested in this and we will plan to get it after she has recovered from her back surgery.

## 2023-04-11 NOTE — Assessment & Plan Note (Signed)
No symptoms suggestive of significant worsening. Last echocardiogram in April 2023 showed mild aortic stenosis with a mean gradient of 11. -Plan for follow-up in two years with an echocardiogram prior to the visit for surveillance.

## 2023-04-11 NOTE — Assessment & Plan Note (Signed)
LDL optimal.  Continue Lipitor 10 mg daily. If she has a high calcium score, will adjust statin therapy to a goal LDL of <70.

## 2023-04-11 NOTE — Patient Instructions (Signed)
Medication Instructions:  Your physician recommends that you continue on your current medications as directed. Please refer to the Current Medication list given to you today.  *If you need a refill on your cardiac medications before your next appointment, please call your pharmacy*   Lab Work: None ordered  If you have labs (blood work) drawn today and your tests are completely normal, you will receive your results only by: MyChart Message (if you have MyChart) OR A paper copy in the mail If you have any lab test that is abnormal or we need to change your treatment, we will call you to review the results.   Testing/Procedures: Your physician has requested that you have an echocardiogram IN 2 YEARS. Echocardiography is a painless test that uses sound waves to create images of your heart. It provides your doctor with information about the size and shape of your heart and how well your heart's chambers and valves are working. This procedure takes approximately one hour. There are no restrictions for this procedure. Please do NOT wear cologne, perfume, aftershave, or lotions (deodorant is allowed). Please arrive 15 minutes prior to your appointment time.   Your physician recommends that you have a CT Calcium Score.  Follow-Up: At [facility], you and your health needs are our priority.  As part of our continuing mission to provide you with exceptional heart care, we have created designated Provider Care Teams.  These Care Teams include your primary Cardiologist (physician) and Advanced Practice Providers (APPs -  Physician Assistants and Nurse Practitioners) who all work together to provide you with the care you need, when you need it.  We recommend signing up for the patient portal called "MyChart".  Sign up information is provided on this After Visit Summary.  MyChart is used to connect with patients for Virtual Visits (Telemedicine).  Patients are able to view lab/test results,  encounter notes, upcoming appointments, etc.  Non-urgent messages can be sent to your provider as well.   To learn more about what you can do with MyChart, go to [email or web address].    Your next appointment:   2 year(s)  Provider:   Kristeen Miss, MD     Other Instructions

## 2023-04-11 NOTE — Telephone Encounter (Signed)
Notes faxed to surgeon Tereso Newcomer, PA-C    04/11/2023 6:10 PM

## 2023-04-13 NOTE — Addendum Note (Signed)
Addended by: Burnetta Sabin on: 04/13/2023 06:36 AM   Modules accepted: Orders

## 2023-07-04 DIAGNOSIS — Z961 Presence of intraocular lens: Secondary | ICD-10-CM | POA: Diagnosis not present

## 2023-07-04 DIAGNOSIS — H018 Other specified inflammations of eyelid: Secondary | ICD-10-CM | POA: Diagnosis not present

## 2023-07-04 DIAGNOSIS — H4710 Unspecified papilledema: Secondary | ICD-10-CM | POA: Diagnosis not present

## 2023-07-04 DIAGNOSIS — H04123 Dry eye syndrome of bilateral lacrimal glands: Secondary | ICD-10-CM | POA: Diagnosis not present

## 2023-07-17 ENCOUNTER — Encounter: Payer: Self-pay | Admitting: Physician Assistant

## 2023-07-17 DIAGNOSIS — M4326 Fusion of spine, lumbar region: Secondary | ICD-10-CM | POA: Diagnosis not present

## 2023-07-17 DIAGNOSIS — M25552 Pain in left hip: Secondary | ICD-10-CM | POA: Diagnosis not present

## 2023-07-18 ENCOUNTER — Ambulatory Visit
Admission: RE | Admit: 2023-07-18 | Discharge: 2023-07-18 | Disposition: A | Discharge status: Home / Self Care | Payer: Medicare Other | Source: Ambulatory Visit | Attending: Internal Medicine | Admitting: Internal Medicine

## 2023-07-18 DIAGNOSIS — M85852 Other specified disorders of bone density and structure, left thigh: Secondary | ICD-10-CM

## 2023-07-18 DIAGNOSIS — M8588 Other specified disorders of bone density and structure, other site: Secondary | ICD-10-CM | POA: Diagnosis not present

## 2023-07-20 DIAGNOSIS — M791 Myalgia, unspecified site: Secondary | ICD-10-CM | POA: Diagnosis not present

## 2023-08-10 DIAGNOSIS — M47816 Spondylosis without myelopathy or radiculopathy, lumbar region: Secondary | ICD-10-CM | POA: Diagnosis not present

## 2023-08-10 DIAGNOSIS — M963 Postlaminectomy kyphosis: Secondary | ICD-10-CM | POA: Diagnosis not present

## 2023-08-14 NOTE — Progress Notes (Signed)
 [facility] - Clinic Note  08/22/2023     CHIEF COMPLAINT Patient presents for Retina Follow Up   HISTORY OF PRESENT ILLNESS: Courtney [name] is a 73 y.o. female who presents to the clinic today for:   HPI     Retina Follow Up   In left eye.  This started 1 year ago.  Duration of 1 year.  Since onset it is stable.  I, the attending physician,  performed the HPI with the patient and updated documentation appropriately.        Comments   1 year retina follow up for retina hole pt is reporting no vision changes noticed she has some floaters denies flashes she is having dryness she is going to [facility] to see Dr Jackie Plum and Dr Peterson Lombard for edema optic disc she is using systane restasis bid ou she had lipiflow done several months ago       Last edited by Rennis Chris, MD on 08/23/2023 12:13 AM.    Pt states she has stopped taking diamox per Dr. Peterson Lombard at [facility], she is on one gram of valacyclovir, she states the past few weeks she has had an increase in migraines  Referring physician: Maurice Small, MD [address] [place],  [place] [address]  HISTORICAL INFORMATION:   Selected notes from the MEDICAL RECORD NUMBER Referred by Dr. Cathey Endow for concern of operculated tear OS LEE:  Ocular Hx- PMH-    CURRENT MEDICATIONS: Current Outpatient Medications (Ophthalmic Drugs)  Medication Sig   Artificial Tear Solution (GENTEAL TEARS OP) Place 1 drop into both eyes daily as needed (dry eyes).   Carboxymethylcellul-Glycerin (REFRESH OPTIVE OP) Place 1 drop into both eyes daily as needed (dry eyes).   No current facility-administered medications for this visit. (Ophthalmic Drugs)   Current Outpatient Medications (Other)  Medication Sig   acetaminophen (TYLENOL) 500 MG tablet Take 500 mg by mouth in the morning, at noon, and at bedtime.   acetaZOLAMIDE ER (DIAMOX) 500 MG capsule Take 500 mg by mouth at bedtime.   Ascorbic Acid (VITAMIN C) 1000 MG tablet Take  1,000 mg by mouth in the morning and at bedtime.   atorvastatin (LIPITOR) 10 MG tablet Take 10 mg by mouth daily.   CALCIUM CITRATE PO Take 3,000 mg by mouth daily.   cholecalciferol (VITAMIN D3) 25 MCG (1000 UNIT) tablet Take 1,000 Units by mouth daily.   clobetasol cream (TEMOVATE) 0.05 % Apply 1 Application topically 2 (two) times a week.   Coenzyme Q10 300 MG CAPS Take 300 mg by mouth every 3 (three) days.   diclofenac Sodium (VOLTAREN) 1 % GEL Apply topically as needed.   DOXYCYCLINE MONOHYDRATE PO Take 50 mg by mouth daily.   DULoxetine (CYMBALTA) 30 MG capsule Take 30 mg by mouth daily after breakfast.    EPINEPHrine (EPIPEN JR) 0.15 MG/0.3ML injection Inject 0.15 mg into the muscle as needed for anaphylaxis.   estradiol (ESTRACE) 0.1 MG/GM vaginal cream Place 1 Applicatorful vaginally at bedtime.   famotidine (PEPCID) 20 MG tablet Take 20 mg by mouth at bedtime.   FERROUS BISGLYCINATE CHELATE PO Take 25 mg by mouth daily.    ferrous sulfate 325 (65 FE) MG tablet Take 325 mg by mouth 2 (two) times daily.   fluticasone (FLONASE) 50 MCG/ACT nasal spray Place 1 spray into both nostrils at bedtime.    GEMTESA 75 MG TABS Take 1 tablet by mouth daily.   levothyroxine (SYNTHROID, LEVOTHROID) 25 MCG  tablet Take 1 1/2 tablets daily (Patient taking differently: Take 37.5 mcg by mouth daily before breakfast. Take 1 1/2 tablets daily)   meloxicam (MOBIC) 15 MG tablet Take 15 mg by mouth daily.   methocarbamol (ROBAXIN) 500 MG tablet Take 500 mg by mouth 2 (two) times daily.   naproxen (NAPROSYN) 250 MG tablet Take 250 mg by mouth 2 (two) times daily with a meal.   Oxycodone HCl 10 MG TABS Take 10 mg by mouth 4 (four) times daily as needed.   polyethylene glycol (MIRALAX / GLYCOLAX) 17 g packet Take 17 g by mouth daily.   PRESCRIPTION MEDICATION Tyrvaya -- Nasal Spray -- used for dry eyes   psyllium (REGULOID) 0.52 g capsule Take 0.52 g by mouth in the morning and at bedtime.   Tart Cherry 1200 MG  CAPS Take 1,200 mg by mouth daily.   traZODone (DESYREL) 100 MG tablet Take 50 mg by mouth at bedtime.   VITAMIN A PO Take by mouth in the morning, at noon, and at bedtime.   No current facility-administered medications for this visit. (Other)   REVIEW OF SYSTEMS: ROS   Positive for: Neurological, Musculoskeletal, Cardiovascular, Eyes Negative for: Constitutional, Gastrointestinal, Skin, Genitourinary, HENT, Endocrine, Respiratory, Psychiatric, Allergic/Imm, Heme/Lymph Last edited by Etheleen Mayhew, COT on 08/22/2023  1:01 PM.      ALLERGIES Allergies  Allergen Reactions   Fish Allergy Anaphylaxis    Can tolerate shellfish (can eat tuna, salmon, trout, swordfish and redrum). Allergic to fish with scales and fins.   Peanut Butter Flavoring Agent (Non-Screening) Anaphylaxis    Allergic to peanuts and some tree nuts. Can eat cashews, pistachios, and almonds.    Other Other (See Comments)    Unknown.  Tree nuts -- most but not all   PAST MEDICAL HISTORY Past Medical History:  Diagnosis Date   ADHD    ADD   Allergic to pets    pet dander   Allergies    Anemia    take iron   Anxiety    Arthritis    hands, hip, shoulders back,   Asthma    as a child   Bicuspid aortic valve    bicuspid AV with mild AS, ascending aorta 3.40 cm 09/21/21 echo   Complication of anesthesia    slow to wake up and lingers for a long time   Concussion    Depression    Family history of adverse reaction to anesthesia    mom had skin reaction to anesthesia but cant remember what it was or exactly what happened   Fatigue    Fibromyalgia    GERD (gastroesophageal reflux disease)    Hyperlipidemia    Hypothyroidism    Irregular periods    Joint pain    Joint stiffness    Lower back pain    Menopause    Migraines    no longer   Mitral regurgitation    Numbness and tingling    Pollen allergy    Presence of pessary    Ringing in ears    Sleep apnea    does not wear CPAP tapes mouth at  night   Snoring    Past Surgical History:  Procedure Laterality Date   ABDOMINAL EXPOSURE N/A 04/12/2020   Procedure: ABDOMINAL EXPOSURE;  Surgeon: Cephus Shelling, MD;  Location[facility] OR;  Service: Vascular;  Laterality: N/A;   ABDOMINOPLASTY     ANTERIOR CERVICAL DECOMP/DISCECTOMY FUSION N/A 06/28/2016   Procedure: ACDF C5-7  ANTERIOR CERVICAL DECOMPRESSION/DISCECTOMY FUSION 2 LEVELS;  Surgeon: Venita Lick, MD;  Location: [facility] OR;  Service: Orthopedics;  Laterality: N/A;  Requests 3 hrs   ANTERIOR LUMBAR FUSION N/A 04/12/2020   Procedure: Anterior Lumbar Interbody Fusion - Lumbar four-Lumbar five , posterior instrumented fusion Lumbar four-five lumbar three -four;  Surgeon: Tia Alert, MD;  Location: [facility] OR;  Service: Neurosurgery;  Laterality: N/A;   BREAST BIOPSY Right 09/19/2021   CARPAL TUNNEL RELEASE Bilateral 1996   CATARACT EXTRACTION Bilateral    03/08/22 and 03/15/2022   COLONOSCOPY     DENTAL SURGERY     HERNIA REPAIR     times 2   LAMINECTOMY WITH POSTERIOR LATERAL ARTHRODESIS LEVEL 1 N/A 04/12/2020   Procedure: Posterior Instrumented Fusion Lumbar three to lumbar five;  Surgeon: Tia Alert, MD;  Location: [facility] OR;  Service: Neurosurgery;  Laterality: N/A;   LUMBAR LAMINECTOMY/DECOMPRESSION MICRODISCECTOMY  04/21/2015   Procedure: DECOMPRESSION LUMBAR THREE-LUMBAR FOUR;  Surgeon: Venita Lick, MD;  Location: [facility] OR;  Service: Orthopedics;;   LUMBAR LAMINECTOMY/DECOMPRESSION MICRODISCECTOMY N/A 06/20/2017   Procedure: L4-5 decompression, L5-S1 left laminotomy/foraminotomy;  Surgeon: Venita Lick, MD;  Location: [facility] OR;  Service: Orthopedics;  Laterality: N/A;  3 hrs   PARATHYROIDECTOMY  2012   RADIOACTIVE SEED GUIDED EXCISIONAL BREAST BIOPSY Right 10/28/2021   Procedure: RADIOACTIVE SEED GUIDED EXCISIONAL RIGHT BREAST BIOPSY;  Surgeon: Almond Lint, MD;  Location: [facility];  Service: General;  Laterality: Right;   SACROILIAC JOINT FUSION Right  07/24/2018   Procedure: SACROILIAC JOINT FUSION;  Surgeon: Venita Lick, MD;  Location: [facility] OR;  Service: Orthopedics;  Laterality: Right;  90 mins   SACROILIAC JOINT FUSION Left 01/12/2022   Procedure: SPINAL CORD STIMULATOR PLACEMENT, LEFT SACROILIAC FUSION;  Surgeon: Venita Lick, MD;  Location: [facility] OR;  Service: Orthopedics;  Laterality: Left;   SHOULDER ARTHROSCOPY W/ ACROMIAL REPAIR Right    SPINAL CORD STIMULATOR INSERTION Left 01/12/2022   Procedure: LUMBAR SPINAL CORD STIMULATOR INSERTION;  Surgeon: Venita Lick, MD;  Location: [facility] OR;  Service: Orthopedics;  Laterality: Left;   TONSILLECTOMY     TOTAL HIP ARTHROPLASTY Right 11/10/2020   Procedure: TOTAL HIP ARTHROPLASTY ANTERIOR APPROACH;  Surgeon: Ollen Gross, MD;  Location: [facility] ORS;  Service: Orthopedics;  Laterality: Right;    FAMILY HISTORY Family History  Problem Relation Age of Onset   COPD Mother    Cancer Mother    Hypertension Mother    Hyperlipidemia Mother    Heart disease Mother    Thyroid disease Mother    Thyroid disease Sister    Thyroid disease Daughter    Pseudotumor cerebri Neg Hx    SOCIAL HISTORY Social History   Tobacco Use   Smoking status: Former    Current packs/day: 0.00    Average packs/day: 0.3 packs/day for 4.0 years (1.0 ttl pk-yrs)    Types: Cigarettes    Start date: 80    Quit date: 72    Years since quitting: 49.2   Smokeless tobacco: Never   Tobacco comments:    social smoker stop at age 32  Vaping Use   Vaping status: Never Used  Substance Use Topics   Alcohol use: Yes    Alcohol/week: 1.0 - 2.0 standard drink of alcohol    Types: 1 - 2 Glasses of wine per week    Comment: socially   Drug use: No       OPHTHALMIC EXAM:  Base Eye Exam     Visual  Acuity (Snellen - Linear)       Right Left   Dist cc 20/20 20/25 -1   Dist ph cc  NI    Correction: Glasses         Tonometry (Tonopen, 1:08 PM)       Right Left   Pressure 14 16         Pupils        Pupils Dark Light Shape React APD   Right PERRL 2 2 Round Minimal None   Left PERRL 2 2 Round Minimal None         Visual Fields       Left Right    Full Full         Extraocular Movement       Right Left    Full, Ortho Full, Ortho         Neuro/Psych     Oriented x3: Yes   Mood/Affect: Normal         Dilation     Both eyes: 2.5% Phenylephrine @ 1:08 PM           Slit Lamp and Fundus Exam     Slit Lamp Exam       Right Left   Lids/Lashes Dermatochalasis - upper lid, mild MGD Dermatochalasis - upper lid, mild MGD, incomplete blink   Conjunctiva/Sclera White and quiet White and quiet   Cornea Trace Punctate epithelial erosions, well healed cataract wound, tear film debris, fine endo pigment 2+ Punctate epithelial erosions greatest inferiorly, arcus, well healed cataract wound   Anterior Chamber deep and clear deep and clear   Iris Round and dilated Round and dilated   Lens PC IOL in good position PC IOL in good position   Anterior Vitreous Vitreous syneresis, Posterior vitreous detachment Vitreous syneresis, no pigment, Posterior vitreous detachment, operculum ST periphery         Fundus Exam       Right Left   Disc Pink and Sharp, mild tilt, focal PPP temporal, +elevation -- improved, no edema Pink, +elevation/edema greatest superior quad -- slightly improved, mild blurring of margins -- improved, sharp rim   C/D Ratio 0.2 0.2   Macula Flat, good foveal reflex, mild RPE mottling, No heme or edema Flat, good foveal reflex, mild RPE mottling, No heme or edema   Vessels attenuated, Tortuous attenuated, Tortuous   Periphery Attached, No heme Attached, pigmented lattice with VR tuft at 0430, operculated hole at 0130 ora -- good laser surrounding all lesions, No heme, no new RT/RD/lattice           IMAGING AND PROCEDURES  Imaging and Procedures for 08/22/2023  OCT, Retina - OU - Both Eyes       Right Eye Quality was good. Central Foveal  Thickness: 254. Progression has been stable. Findings include normal foveal contour, no IRF, no SRF (stable improvement in disc edema).   Left Eye Quality was good. Central Foveal Thickness: 279. Progression has been stable. Findings include normal foveal contour, no IRF, no SRF (Mild interval improvement in disc edema).   Notes *Images captured and stored on drive  Diagnosis / Impression:  NFP, no IRF/SRF OU OD: stable improvement in disc edema OS: Mild interval improvement in disc edema   Clinical management:  See below  Abbreviations: NFP - Normal foveal profile. CME - cystoid macular edema. PED - pigment epithelial detachment. IRF - intraretinal fluid. SRF - subretinal fluid. EZ - ellipsoid zone. ERM - epiretinal membrane. ORA -  outer retinal atrophy. ORT - outer retinal tubulation. SRHM - subretinal hyper-reflective material. IRHM - intraretinal hyper-reflective material            ASSESSMENT/PLAN:    ICD-10-CM   1. Retinal hole of left eye  H33.322     2. Lattice degeneration of left retina  H35.412     3. Vitreoretinal tuft of left eye  Q14.1     4. Optic disc edema  H47.10 OCT, Retina - OU - Both Eyes    5. Pseudophakia, both eyes  Z96.1     6. Dry eyes  H04.123      1. Operculated retinal hole, OS   - operculated hole at 0130 ora - s/p indirect laser retinopexy OS (10.24.23) -- good laser in place - no new RT/RD or lattice OS - f/u 1 year -- DFE, OCT  2,3. Lattice degeneration w/ VR tuft, left eye - pigmented lattice with VR tuft at 0430 - s/p laser retinopexy OS as above -- good laser in place - no new RT/RD or lattice - f/u 1 yr - DFE, OCT  4. Optic disc edema OU (OS > OD) -- improving  - under the expert management of Dr. Peterson Lombard, Neuro[facility], at [facility]  - currently off Diamox  - interval decrease in disc edema / elevation OU on exam and OCT today  - BCVA remains 20/20 OU  - pt reports history of lower back / spine / nerve issues -- has a spinal cord  stimulatorx  - pt had MRI orbits and LP done emergently on 11.9.23 due to worsening disc edema   MRI orbits showed findings suggesting of IIH and papilledema   LP showed opening pressure of 22cm H2O -- slightly elevated  - 11.29.23 -- pt saw Dr. Lucia Gaskins, Neurologist at [facility] who advised stoppage of Vit A derivative meds  - 12.1.23 -- pt saw Dr. Peterson Lombard, Neuro-[facility] at [facility] and had her most recent follow up on 01.26.24  - Disc edema may be relative IIH w/ mildly elevated opening pressure and low IOP  5. Pseudophakia OU  - s/p CE/IOL (Dr. Cathey Endow, OD: 09.23.23, OS: 10.04.23)  - IOL in good position, doing well  - monitor  6. Dry eyes OU - recommend artificial tears and lubricating ointment as needed   Ophthalmic Meds Ordered this visit:  No orders of the defined types were placed in this encounter.    Return in about 1 year (around 08/21/2024) for f/u operculated hole OS, DFE, OCT.  There are no Patient Instructions on file for this visit.  Explained the diagnoses, plan, and follow up with the patient and they expressed understanding.  Patient expressed understanding of the importance of proper follow up care.   This document serves as a record of services personally performed by Karie Chimera, MD, PhD. It was created on their behalf by Glee Arvin. Manson Passey, OA an ophthalmic technician. The creation of this record is the provider's dictation and/or activities during the visit.    Electronically signed by: Glee Arvin. Manson Passey, OA 08/23/23 12:14 AM  Karie Chimera, M.D., Ph.D. Diseases & Surgery of the Retina and Vitreous [facility]  I have reviewed the above documentation for accuracy and completeness, and I agree with the above. Karie Chimera, M.D., Ph.D. 08/23/23 12:17 AM   Abbreviations: M myopia (nearsighted); A astigmatism; H hyperopia (farsighted); P presbyopia; Mrx spectacle prescription;  CTL contact lenses; OD right eye; OS left eye; OU both  eyes  XT exotropia; ET esotropia; PEK punctate epithelial keratitis; PEE punctate epithelial erosions; DES dry eye syndrome; MGD meibomian gland dysfunction; ATs artificial tears; PFAT's preservative free artificial tears; NSC nuclear sclerotic cataract; PSC posterior subcapsular cataract; ERM epi-retinal membrane; PVD posterior vitreous detachment; RD retinal detachment; DM diabetes mellitus; DR diabetic retinopathy; NPDR non-proliferative diabetic retinopathy; PDR proliferative diabetic retinopathy; CSME clinically significant macular edema; DME diabetic macular edema; dbh dot blot hemorrhages; CWS cotton wool spot; POAG primary open angle glaucoma; C/D cup-to-disc ratio; HVF humphrey visual field; GVF goldmann visual field; OCT optical coherence tomography; IOP intraocular pressure; BRVO Branch retinal vein occlusion; CRVO central retinal vein occlusion; CRAO central retinal artery occlusion; BRAO branch retinal artery occlusion; RT retinal tear; SB scleral buckle; PPV pars plana vitrectomy; VH Vitreous hemorrhage; PRP panretinal laser photocoagulation; IVK intravitreal kenalog; VMT vitreomacular traction; MH Macular hole;  NVD neovascularization of the disc; NVE neovascularization elsewhere; AREDS age related eye disease study; ARMD age related macular degeneration; POAG primary open angle glaucoma; EBMD epithelial/anterior basement membrane dystrophy; ACIOL anterior chamber intraocular lens; IOL intraocular lens; PCIOL posterior chamber intraocular lens; Phaco/IOL phacoemulsification with intraocular lens placement; PRK photorefractive keratectomy; LASIK laser assisted in situ keratomileusis; HTN hypertension; DM diabetes mellitus; COPD chronic obstructive pulmonary disease

## 2023-08-20 ENCOUNTER — Other Ambulatory Visit: Payer: Self-pay | Admitting: Internal Medicine

## 2023-08-20 DIAGNOSIS — Z0000 Encounter for general adult medical examination without abnormal findings: Secondary | ICD-10-CM

## 2023-08-22 ENCOUNTER — Ambulatory Visit (INDEPENDENT_AMBULATORY_CARE_PROVIDER_SITE_OTHER): Payer: BC Managed Care – PPO | Admitting: Ophthalmology

## 2023-08-22 ENCOUNTER — Encounter (INDEPENDENT_AMBULATORY_CARE_PROVIDER_SITE_OTHER): Payer: Self-pay | Admitting: Ophthalmology

## 2023-08-22 DIAGNOSIS — H33322 Round hole, left eye: Secondary | ICD-10-CM

## 2023-08-22 DIAGNOSIS — Z961 Presence of intraocular lens: Secondary | ICD-10-CM | POA: Diagnosis not present

## 2023-08-22 DIAGNOSIS — H35412 Lattice degeneration of retina, left eye: Secondary | ICD-10-CM

## 2023-08-22 DIAGNOSIS — Q141 Congenital malformation of retina: Secondary | ICD-10-CM | POA: Diagnosis not present

## 2023-08-22 DIAGNOSIS — H04123 Dry eye syndrome of bilateral lacrimal glands: Secondary | ICD-10-CM

## 2023-08-22 DIAGNOSIS — H4710 Unspecified papilledema: Secondary | ICD-10-CM

## 2023-08-23 ENCOUNTER — Encounter (INDEPENDENT_AMBULATORY_CARE_PROVIDER_SITE_OTHER): Payer: Self-pay | Admitting: Ophthalmology

## 2023-09-04 DIAGNOSIS — M25551 Pain in right hip: Secondary | ICD-10-CM | POA: Diagnosis not present

## 2023-09-12 DIAGNOSIS — M47896 Other spondylosis, lumbar region: Secondary | ICD-10-CM | POA: Diagnosis not present

## 2023-09-12 DIAGNOSIS — M542 Cervicalgia: Secondary | ICD-10-CM | POA: Diagnosis not present

## 2023-09-12 DIAGNOSIS — M4716 Other spondylosis with myelopathy, lumbar region: Secondary | ICD-10-CM | POA: Diagnosis not present

## 2023-09-12 DIAGNOSIS — M5416 Radiculopathy, lumbar region: Secondary | ICD-10-CM | POA: Diagnosis not present

## 2023-09-12 DIAGNOSIS — M961 Postlaminectomy syndrome, not elsewhere classified: Secondary | ICD-10-CM | POA: Diagnosis not present

## 2023-09-12 DIAGNOSIS — M47812 Spondylosis without myelopathy or radiculopathy, cervical region: Secondary | ICD-10-CM | POA: Diagnosis not present

## 2023-09-12 DIAGNOSIS — G894 Chronic pain syndrome: Secondary | ICD-10-CM | POA: Diagnosis not present

## 2023-09-12 DIAGNOSIS — M51369 Other intervertebral disc degeneration, lumbar region without mention of lumbar back pain or lower extremity pain: Secondary | ICD-10-CM | POA: Diagnosis not present

## 2023-10-31 DIAGNOSIS — E039 Hypothyroidism, unspecified: Secondary | ICD-10-CM | POA: Diagnosis not present

## 2023-10-31 DIAGNOSIS — D649 Anemia, unspecified: Secondary | ICD-10-CM | POA: Diagnosis not present

## 2023-10-31 DIAGNOSIS — H4710 Unspecified papilledema: Secondary | ICD-10-CM | POA: Diagnosis not present

## 2023-10-31 DIAGNOSIS — E7800 Pure hypercholesterolemia, unspecified: Secondary | ICD-10-CM | POA: Diagnosis not present

## 2023-10-31 DIAGNOSIS — L900 Lichen sclerosus et atrophicus: Secondary | ICD-10-CM | POA: Diagnosis not present

## 2023-10-31 DIAGNOSIS — Z0000 Encounter for general adult medical examination without abnormal findings: Secondary | ICD-10-CM | POA: Diagnosis not present

## 2023-10-31 DIAGNOSIS — R7303 Prediabetes: Secondary | ICD-10-CM | POA: Diagnosis not present

## 2023-10-31 DIAGNOSIS — G4733 Obstructive sleep apnea (adult) (pediatric): Secondary | ICD-10-CM | POA: Diagnosis not present

## 2023-10-31 DIAGNOSIS — I700 Atherosclerosis of aorta: Secondary | ICD-10-CM | POA: Diagnosis not present

## 2023-10-31 DIAGNOSIS — M4800 Spinal stenosis, site unspecified: Secondary | ICD-10-CM | POA: Diagnosis not present

## 2023-10-31 DIAGNOSIS — J309 Allergic rhinitis, unspecified: Secondary | ICD-10-CM | POA: Diagnosis not present

## 2023-11-15 DIAGNOSIS — Z9889 Other specified postprocedural states: Secondary | ICD-10-CM | POA: Diagnosis not present

## 2023-11-15 DIAGNOSIS — M25511 Pain in right shoulder: Secondary | ICD-10-CM | POA: Diagnosis not present

## 2023-11-15 DIAGNOSIS — M5116 Intervertebral disc disorders with radiculopathy, lumbar region: Secondary | ICD-10-CM | POA: Diagnosis not present

## 2023-11-15 DIAGNOSIS — T85193S Other mechanical complication of implanted electronic neurostimulator, generator, sequela: Secondary | ICD-10-CM | POA: Diagnosis not present

## 2023-11-15 DIAGNOSIS — T85192A Other mechanical complication of implanted electronic neurostimulator (electrode) of spinal cord, initial encounter: Secondary | ICD-10-CM | POA: Diagnosis not present

## 2023-11-15 DIAGNOSIS — Z981 Arthrodesis status: Secondary | ICD-10-CM | POA: Diagnosis not present

## 2023-11-15 DIAGNOSIS — M5451 Vertebrogenic low back pain: Secondary | ICD-10-CM | POA: Diagnosis not present

## 2023-11-15 DIAGNOSIS — G8929 Other chronic pain: Secondary | ICD-10-CM | POA: Diagnosis not present

## 2023-11-21 ENCOUNTER — Ambulatory Visit
Admission: RE | Admit: 2023-11-21 | Discharge: 2023-11-21 | Disposition: A | Discharge status: Home / Self Care | Source: Ambulatory Visit | Attending: Internal Medicine | Admitting: Internal Medicine

## 2023-11-21 DIAGNOSIS — Z0000 Encounter for general adult medical examination without abnormal findings: Secondary | ICD-10-CM

## 2023-11-21 DIAGNOSIS — Z1231 Encounter for screening mammogram for malignant neoplasm of breast: Secondary | ICD-10-CM | POA: Diagnosis not present

## 2023-12-21 ENCOUNTER — Encounter: Payer: Self-pay | Admitting: Genetic Counselor

## 2023-12-26 DIAGNOSIS — Z01818 Encounter for other preprocedural examination: Secondary | ICD-10-CM | POA: Diagnosis not present

## 2023-12-26 DIAGNOSIS — M5116 Intervertebral disc disorders with radiculopathy, lumbar region: Secondary | ICD-10-CM | POA: Diagnosis not present

## 2023-12-26 DIAGNOSIS — G4700 Insomnia, unspecified: Secondary | ICD-10-CM | POA: Diagnosis not present

## 2023-12-26 DIAGNOSIS — E785 Hyperlipidemia, unspecified: Secondary | ICD-10-CM | POA: Diagnosis not present

## 2023-12-26 DIAGNOSIS — E039 Hypothyroidism, unspecified: Secondary | ICD-10-CM | POA: Diagnosis not present

## 2023-12-26 DIAGNOSIS — H4710 Unspecified papilledema: Secondary | ICD-10-CM | POA: Diagnosis not present

## 2023-12-26 DIAGNOSIS — G4733 Obstructive sleep apnea (adult) (pediatric): Secondary | ICD-10-CM | POA: Diagnosis not present

## 2023-12-26 DIAGNOSIS — I350 Nonrheumatic aortic (valve) stenosis: Secondary | ICD-10-CM | POA: Diagnosis not present

## 2023-12-26 DIAGNOSIS — H04123 Dry eye syndrome of bilateral lacrimal glands: Secondary | ICD-10-CM | POA: Diagnosis not present

## 2023-12-26 DIAGNOSIS — G8929 Other chronic pain: Secondary | ICD-10-CM | POA: Diagnosis not present

## 2023-12-26 DIAGNOSIS — Q2381 Bicuspid aortic valve: Secondary | ICD-10-CM | POA: Diagnosis not present

## 2023-12-28 DIAGNOSIS — M5416 Radiculopathy, lumbar region: Secondary | ICD-10-CM | POA: Diagnosis not present

## 2023-12-28 DIAGNOSIS — M48062 Spinal stenosis, lumbar region with neurogenic claudication: Secondary | ICD-10-CM | POA: Diagnosis not present

## 2024-01-01 DIAGNOSIS — Z5181 Encounter for therapeutic drug level monitoring: Secondary | ICD-10-CM | POA: Diagnosis not present

## 2024-01-01 DIAGNOSIS — Z79899 Other long term (current) drug therapy: Secondary | ICD-10-CM | POA: Diagnosis not present

## 2024-01-01 DIAGNOSIS — M961 Postlaminectomy syndrome, not elsewhere classified: Secondary | ICD-10-CM | POA: Diagnosis not present

## 2024-01-01 DIAGNOSIS — M5451 Vertebrogenic low back pain: Secondary | ICD-10-CM | POA: Diagnosis not present

## 2024-01-02 DIAGNOSIS — H0288A Meibomian gland dysfunction right eye, upper and lower eyelids: Secondary | ICD-10-CM | POA: Diagnosis not present

## 2024-01-02 DIAGNOSIS — H26492 Other secondary cataract, left eye: Secondary | ICD-10-CM | POA: Diagnosis not present

## 2024-01-02 DIAGNOSIS — H0288B Meibomian gland dysfunction left eye, upper and lower eyelids: Secondary | ICD-10-CM | POA: Diagnosis not present

## 2024-01-02 DIAGNOSIS — H04123 Dry eye syndrome of bilateral lacrimal glands: Secondary | ICD-10-CM | POA: Diagnosis not present

## 2024-01-02 DIAGNOSIS — Z961 Presence of intraocular lens: Secondary | ICD-10-CM | POA: Diagnosis not present

## 2024-01-02 DIAGNOSIS — H4710 Unspecified papilledema: Secondary | ICD-10-CM | POA: Diagnosis not present

## 2024-01-08 DIAGNOSIS — L578 Other skin changes due to chronic exposure to nonionizing radiation: Secondary | ICD-10-CM | POA: Diagnosis not present

## 2024-01-08 DIAGNOSIS — D225 Melanocytic nevi of trunk: Secondary | ICD-10-CM | POA: Diagnosis not present

## 2024-01-08 DIAGNOSIS — L900 Lichen sclerosus et atrophicus: Secondary | ICD-10-CM | POA: Diagnosis not present

## 2024-01-08 DIAGNOSIS — L814 Other melanin hyperpigmentation: Secondary | ICD-10-CM | POA: Diagnosis not present

## 2024-01-08 DIAGNOSIS — L821 Other seborrheic keratosis: Secondary | ICD-10-CM | POA: Diagnosis not present

## 2024-01-08 DIAGNOSIS — L219 Seborrheic dermatitis, unspecified: Secondary | ICD-10-CM | POA: Diagnosis not present

## 2024-01-09 DIAGNOSIS — M48062 Spinal stenosis, lumbar region with neurogenic claudication: Secondary | ICD-10-CM | POA: Diagnosis not present

## 2024-01-09 DIAGNOSIS — M5116 Intervertebral disc disorders with radiculopathy, lumbar region: Secondary | ICD-10-CM | POA: Diagnosis not present

## 2024-01-09 DIAGNOSIS — T85192A Other mechanical complication of implanted electronic neurostimulator (electrode) of spinal cord, initial encounter: Secondary | ICD-10-CM | POA: Diagnosis not present

## 2024-01-09 DIAGNOSIS — M960 Pseudarthrosis after fusion or arthrodesis: Secondary | ICD-10-CM | POA: Diagnosis not present

## 2024-01-09 DIAGNOSIS — T85193A Other mechanical complication of implanted electronic neurostimulator, generator, initial encounter: Secondary | ICD-10-CM | POA: Diagnosis not present

## 2024-01-09 DIAGNOSIS — M47816 Spondylosis without myelopathy or radiculopathy, lumbar region: Secondary | ICD-10-CM | POA: Diagnosis not present

## 2024-01-09 DIAGNOSIS — Z981 Arthrodesis status: Secondary | ICD-10-CM | POA: Diagnosis not present

## 2024-01-09 DIAGNOSIS — E039 Hypothyroidism, unspecified: Secondary | ICD-10-CM | POA: Diagnosis not present

## 2024-01-09 DIAGNOSIS — G5710 Meralgia paresthetica, unspecified lower limb: Secondary | ICD-10-CM | POA: Diagnosis not present

## 2024-01-09 DIAGNOSIS — M4726 Other spondylosis with radiculopathy, lumbar region: Secondary | ICD-10-CM | POA: Diagnosis not present

## 2024-01-09 DIAGNOSIS — S32009A Unspecified fracture of unspecified lumbar vertebra, initial encounter for closed fracture: Secondary | ICD-10-CM | POA: Diagnosis not present

## 2024-01-09 DIAGNOSIS — Z87891 Personal history of nicotine dependence: Secondary | ICD-10-CM | POA: Diagnosis not present

## 2024-01-09 DIAGNOSIS — K219 Gastro-esophageal reflux disease without esophagitis: Secondary | ICD-10-CM | POA: Diagnosis not present

## 2024-01-09 DIAGNOSIS — M48061 Spinal stenosis, lumbar region without neurogenic claudication: Secondary | ICD-10-CM | POA: Diagnosis not present

## 2024-01-10 DIAGNOSIS — M48061 Spinal stenosis, lumbar region without neurogenic claudication: Secondary | ICD-10-CM | POA: Diagnosis not present

## 2024-01-10 DIAGNOSIS — Z87891 Personal history of nicotine dependence: Secondary | ICD-10-CM | POA: Diagnosis not present

## 2024-01-10 DIAGNOSIS — E039 Hypothyroidism, unspecified: Secondary | ICD-10-CM | POA: Diagnosis not present

## 2024-01-10 DIAGNOSIS — Z981 Arthrodesis status: Secondary | ICD-10-CM | POA: Diagnosis not present

## 2024-01-10 DIAGNOSIS — M47816 Spondylosis without myelopathy or radiculopathy, lumbar region: Secondary | ICD-10-CM | POA: Diagnosis not present

## 2024-01-10 DIAGNOSIS — K219 Gastro-esophageal reflux disease without esophagitis: Secondary | ICD-10-CM | POA: Diagnosis not present

## 2024-01-10 DIAGNOSIS — T85193A Other mechanical complication of implanted electronic neurostimulator, generator, initial encounter: Secondary | ICD-10-CM | POA: Diagnosis not present

## 2024-01-10 DIAGNOSIS — M960 Pseudarthrosis after fusion or arthrodesis: Secondary | ICD-10-CM | POA: Diagnosis not present

## 2024-02-07 DIAGNOSIS — M5416 Radiculopathy, lumbar region: Secondary | ICD-10-CM | POA: Diagnosis not present

## 2024-02-26 ENCOUNTER — Telehealth: Payer: Self-pay | Admitting: Neurosurgery

## 2024-02-26 NOTE — Telephone Encounter (Signed)
 This patient called the office today.  She is not a current patient of [facility].  Patient has been told that she needs a release of the lateral cutaneous nerve in her abdomen.  She is wanting to know if there is a neurosurgeon that does this procedure or who we would recommend.   Please advise.

## 2024-02-28 DIAGNOSIS — G5711 Meralgia paresthetica, right lower limb: Secondary | ICD-10-CM | POA: Diagnosis not present

## 2024-02-28 NOTE — Telephone Encounter (Signed)
 Left message for patient to call our office back.

## 2024-02-28 NOTE — Telephone Encounter (Signed)
 Called patient to make her aware that Dr. Claudene would be able to see her. I told her to expect a call from the [place] office to schedule an appointment.

## 2024-02-29 NOTE — Telephone Encounter (Signed)
 Patient scheduled.

## 2024-03-05 NOTE — Progress Notes (Signed)
 Referring Physician:  No referring provider defined for this encounter.  Primary Physician:  Signa Dire, MD (Inactive)  History of Present Illness: 03/10/2024 Courtney [name] is here today with a chief complaint of right-sided meralgia paresthetica.  She does have a complicated past medical history including a history of multiple spinal fusions.  She does state that her initial symptoms started in 2017 when she was sitting on the ground for a prolonged period and she stood up she had severe pain in her right sided groin and then progressed to having numbness tingling and pain down her right anterior thigh. She has had difficulty with managing the symptoms over the past many years.  She has had multiple spinal fusions which did help with some back pain but not with the anterior lateral pain.   Conservative measures:  Injections: Yes  02/28/2024 Right Lateral Femoral Cutaneous Perineural Injection (only helped for about 48 hours) 09/04/2023 Right lateral femoral cutaneous nerve perineural injection(almost 100% relief for about 1 week) Gabapentin : Yes, Lyrica : Yes, Cymbalta : Yes Past Surgery: Yes, multiple lumbar surgeries, SCS  and Neck Surgery  Is this a Second Opinion: No  Review of Systems:  A 10 point review of systems is negative, except for the pertinent positives and negatives detailed in the HPI.  Past Medical History: Past Medical History:  Diagnosis Date   ADHD    ADD   Allergic to pets    pet dander   Allergies    Anemia    take iron    Anxiety    Arthritis    hands, hip, shoulders back,   Asthma    as a child   Bicuspid aortic valve    bicuspid AV with mild AS, ascending aorta 3.40 cm 09/21/21 echo   Complication of anesthesia    slow to wake up and lingers for a long time   Concussion    Depression    Family history of adverse reaction to anesthesia    mom had skin reaction to anesthesia but cant remember what it was or exactly what happened    Fatigue    Fibromyalgia    GERD (gastroesophageal reflux disease)    Hyperlipidemia    Hypothyroidism    Irregular periods    Joint pain    Joint stiffness    Lower back pain    Menopause    Migraines    no longer   Mitral regurgitation    Numbness and tingling    Pollen allergy    Presence of pessary    Ringing in ears    Sleep apnea    does not wear CPAP tapes mouth at night   Snoring     Past Surgical History: Past Surgical History:  Procedure Laterality Date   ABDOMINAL EXPOSURE N/A 04/12/2020   Procedure: ABDOMINAL EXPOSURE;  Surgeon: Gretta Lonni PARAS, MD;  Location: [facility] OR;  Service: Vascular;  Laterality: N/A;   ABDOMINOPLASTY     ANTERIOR CERVICAL DECOMP/DISCECTOMY FUSION N/A 06/28/2016   Procedure: ACDF C5-7 ANTERIOR CERVICAL DECOMPRESSION/DISCECTOMY FUSION 2 LEVELS;  Surgeon: Donaciano Sprang, MD;  Location: [facility] OR;  Service: Orthopedics;  Laterality: N/A;  Requests 3 hrs   ANTERIOR LUMBAR FUSION N/A 04/12/2020   Procedure: Anterior Lumbar Interbody Fusion - Lumbar four-Lumbar five , posterior instrumented fusion Lumbar four-five lumbar three -four;  Surgeon: Joshua Alm RAMAN, MD;  Location: [facility] OR;  Service: Neurosurgery;  Laterality: N/A;   BREAST BIOPSY Right 09/19/2021   CARPAL TUNNEL RELEASE Bilateral 1996  CATARACT EXTRACTION Bilateral    03/08/22 and 03/15/2022   COLONOSCOPY     DENTAL SURGERY     HERNIA REPAIR     times 2   LAMINECTOMY WITH POSTERIOR LATERAL ARTHRODESIS LEVEL 1 N/A 04/12/2020   Procedure: Posterior Instrumented Fusion Lumbar three to lumbar five;  Surgeon: Joshua Alm RAMAN, MD;  Location: [facility] OR;  Service: Neurosurgery;  Laterality: N/A;   LUMBAR LAMINECTOMY/DECOMPRESSION MICRODISCECTOMY  04/21/2015   Procedure: DECOMPRESSION LUMBAR THREE-LUMBAR FOUR;  Surgeon: Donaciano Sprang, MD;  Location: [facility] OR;  Service: Orthopedics;;   LUMBAR LAMINECTOMY/DECOMPRESSION MICRODISCECTOMY N/A 06/20/2017   Procedure: L4-5 decompression, L5-S1 left  laminotomy/foraminotomy;  Surgeon: Sprang Donaciano, MD;  Location: [facility] OR;  Service: Orthopedics;  Laterality: N/A;  3 hrs   PARATHYROIDECTOMY  2012   RADIOACTIVE SEED GUIDED EXCISIONAL BREAST BIOPSY Right 10/28/2021   Procedure: RADIOACTIVE SEED GUIDED EXCISIONAL RIGHT BREAST BIOPSY;  Surgeon: Aron Shoulders, MD;  Location: [facility];  Service: General;  Laterality: Right;   SACROILIAC JOINT FUSION Right 07/24/2018   Procedure: SACROILIAC JOINT FUSION;  Surgeon: Sprang Donaciano, MD;  Location: [facility] OR;  Service: Orthopedics;  Laterality: Right;  90 mins   SACROILIAC JOINT FUSION Left 01/12/2022   Procedure: SPINAL CORD STIMULATOR PLACEMENT, LEFT SACROILIAC FUSION;  Surgeon: Sprang Donaciano, MD;  Location: [facility] OR;  Service: Orthopedics;  Laterality: Left;   SHOULDER ARTHROSCOPY W/ ACROMIAL REPAIR Right    SPINAL CORD STIMULATOR INSERTION Left 01/12/2022   Procedure: LUMBAR SPINAL CORD STIMULATOR INSERTION;  Surgeon: Sprang Donaciano, MD;  Location: [facility] OR;  Service: Orthopedics;  Laterality: Left;   TONSILLECTOMY     TOTAL HIP ARTHROPLASTY Right 11/10/2020   Procedure: TOTAL HIP ARTHROPLASTY ANTERIOR APPROACH;  Surgeon: Melodi Lerner, MD;  Location: [facility] ORS;  Service: Orthopedics;  Laterality: Right;     Allergies: Allergies as of 03/10/2024 - Review Complete 03/10/2024  Allergen Reaction Noted   Fish allergy Anaphylaxis 06/23/2014   Peanut butter flavoring agent (non-screening) Anaphylaxis 01/05/2022   Hazelnut (filbert) Other (See Comments) 03/06/2024   Other Other (See Comments) 04/20/2022    Medications:  Current Outpatient Medications:    Propylene Glycol (VISTA MEIBO TEARS) 0.6 % SOLN, Apply 1 drop to eye in the morning, at noon, in the evening, and at bedtime., Disp: , Rfl:    acetaminophen  (TYLENOL ) 500 MG tablet, Take 500 mg by mouth in the morning, at noon, and at bedtime., Disp: , Rfl:    acetaZOLAMIDE ER (DIAMOX) 500 MG capsule, Take 500 mg by mouth at bedtime.,  Disp: , Rfl:    Ascorbic Acid  (VITAMIN C) 1000 MG tablet, Take 1,000 mg by mouth in the morning and at bedtime., Disp: , Rfl:    atorvastatin  (LIPITOR) 10 MG tablet, Take 10 mg by mouth daily., Disp: , Rfl:    CALCIUM  CITRATE PO, Take 3,000 mg by mouth daily., Disp: , Rfl:    Carboxymethylcellul-Glycerin  (REFRESH OPTIVE OP), Place 1 drop into both eyes daily as needed (dry eyes)., Disp: , Rfl:    cholecalciferol  (VITAMIN D3) 25 MCG (1000 UNIT) tablet, Take 1,000 Units by mouth daily., Disp: , Rfl:    clobetasol cream (TEMOVATE) 0.05 %, Apply 1 Application topically 2 (two) times a week., Disp: , Rfl:    diclofenac Sodium (VOLTAREN) 1 % GEL, Apply topically as needed., Disp: , Rfl:    DULoxetine  (CYMBALTA ) 30 MG capsule, Take 30 mg by mouth daily after breakfast. , Disp: , Rfl:    EPINEPHrine  (EPIPEN  JR) 0.15 MG/0.3ML injection,  Inject 0.15 mg into the muscle as needed for anaphylaxis., Disp: , Rfl:    estradiol (ESTRACE) 0.1 MG/GM vaginal cream, Place 1 Applicatorful vaginally at bedtime., Disp: , Rfl:    FERROUS BISGLYCINATE  CHELATE PO, Take 25 mg by mouth daily. , Disp: , Rfl:    ferrous sulfate 325 (65 FE) MG tablet, Take 325 mg by mouth 2 (two) times daily., Disp: , Rfl:    GEMTESA 75 MG TABS, Take 1 tablet by mouth daily., Disp: , Rfl:    levothyroxine  (SYNTHROID , LEVOTHROID) 25 MCG tablet, Take 1 1/2 tablets daily (Patient taking differently: Take 37.5 mcg by mouth daily before breakfast. Take 1 1/2 tablets daily), Disp: 130 tablet, Rfl: 2   meloxicam (MOBIC) 15 MG tablet, Take 15 mg by mouth daily., Disp: , Rfl:    methocarbamol  (ROBAXIN ) 500 MG tablet, Take 500 mg by mouth 2 (two) times daily., Disp: , Rfl:    Oxycodone  HCl 10 MG TABS, Take 10 mg by mouth 4 (four) times daily as needed., Disp: , Rfl:    polyethylene glycol (MIRALAX  / GLYCOLAX ) 17 g packet, Take 17 g by mouth daily., Disp: , Rfl:    psyllium (REGULOID) 0.52 g capsule, Take 0.52 g by mouth in the morning and at bedtime.,  Disp: , Rfl:    Tart Cherry 1200 MG CAPS, Take 1,200 mg by mouth daily., Disp: , Rfl:    traZODone  (DESYREL ) 100 MG tablet, Take 50 mg by mouth at bedtime., Disp: , Rfl:    VITAMIN A  PO, Take by mouth in the morning, at noon, and at bedtime., Disp: , Rfl:   Social History: Social History   Tobacco Use   Smoking status: Former    Current packs/day: 0.00    Average packs/day: 0.3 packs/day for 4.0 years (1.0 ttl pk-yrs)    Types: Cigarettes    Start date: 54    Quit date: 1976    Years since quitting: 49.7   Smokeless tobacco: Never   Tobacco comments:    social smoker stop at age 22  Vaping Use   Vaping status: Never Used  Substance Use Topics   Alcohol  use: Yes    Alcohol /week: 1.0 - 2.0 standard drink of alcohol     Types: 1 - 2 Glasses of wine per week    Comment: socially   Drug use: No    Family Medical History: Family History  Problem Relation Age of Onset   Breast cancer Mother 49 - 25   COPD Mother    Cancer Mother    Hypertension Mother    Hyperlipidemia Mother    Heart disease Mother    Thyroid  disease Mother    Thyroid  disease Sister    Thyroid  disease Daughter    Pseudotumor cerebri Neg Hx    BRCA 1/2 Neg Hx     Physical Examination: Vitals:   03/10/24 1405  BP: 138/70    General: Patient is in no apparent distress. Attention to examination is appropriate.  Neck:   Supple.  Full range of motion.  Respiratory: Patient is breathing without any difficulty.   NEUROLOGICAL:     Awake, alert, oriented to person, place, and time.  Speech is clear and fluent.   Cranial Nerves: Pupils equal round and reactive to light.  Facial tone is symmetric. Shoulder shrug is symmetric. Tongue protrusion is midline.  There is no pronator drift.  Motor Exam: No major motor deficit is noted in the bilateral lower extremities.  No major areas of  atrophy  She has some subjective decrease in sensation in the right lateral femoral cutaneous nerve distribution.  She  does have an anterior hip incision, however this was done multiple years prior to her development pain  Medical Decision Making  Imaging: I reviewed her lumbar MRI, shows multilevel lumbar decompression and fusion.  Electrodiagnostics: I reviewed her most recent neurology referral with Dr. Darleen who diagnosed her with chronic L5-S1 radiculopathy changes.  I reviewed her outside injection records which demonstrate right sided lateral femoral cutaneous nerve injections x 2, 100% reduction in pain temporarily with the first injection and 50% reduction with the second injection.  I have personally reviewed the images and electrodiagnostics and agree with the above interpretation.  Assessment and Plan: Ms. Porcaro is a pleasant 73 y.o. female with symptoms consistent with right-sided meralgia paresthetica.  She has been dealing with the symptoms since 2017.  She has had multiple spinal fusions which have helped with her back pain but have not given her significant relief in the right lower extremity in the lateral femoral cutaneous distribution.  She has had 2 injections both of which gave her significant relief, the first was better than the second but both were right sided lateral femoral cutaneous nerve injections.  She has not had a release.  She has had previous anterior hip surgery.  At this point she has had chronic pain in the lateral femoral cutaneous nerve distribution, she has symptoms consistent with a meralgia paresthetica.  Her symptoms been present for approximately 8 years.  She has responded to injections with a good result but they were short-lived.  We discussed LF CN decompression and transposition.  I did discuss with her that she is at higher risk for lack of improvement given the prolonged period of time that she has had the symptoms.  We discussed the role of neurectomy which I do not like to do upfront and rather save for any episodes of recurrence.  We also discussed the  possibility of needing peripheral nerve stimulation in the future, at this point she would like to go forward with the LF CN decompression on the right side, we discussed risk benefits of procedure.  She would like to go forward with the surgery.   Thank you for involving me in the care of this patient.    Penne MICAEL Sharps MD/MSCR Neurosurgery - Peripheral Nerve Surgery

## 2024-03-10 ENCOUNTER — Ambulatory Visit: Admitting: Neurosurgery

## 2024-03-10 ENCOUNTER — Ambulatory Visit: Payer: Self-pay | Admitting: Neurosurgery

## 2024-03-10 ENCOUNTER — Inpatient Hospital Stay
Admission: RE | Admit: 2024-03-10 | Discharge: 2024-03-10 | Disposition: A | Discharge status: Home / Self Care | Payer: Self-pay | Source: Ambulatory Visit | Attending: Neurosurgery | Admitting: Neurosurgery

## 2024-03-10 ENCOUNTER — Other Ambulatory Visit: Payer: Self-pay | Admitting: Family Medicine

## 2024-03-10 VITALS — BP 138/70 | Ht 64.0 in | Wt 127.0 lb

## 2024-03-10 DIAGNOSIS — Z049 Encounter for examination and observation for unspecified reason: Secondary | ICD-10-CM

## 2024-03-10 DIAGNOSIS — M79604 Pain in right leg: Secondary | ICD-10-CM

## 2024-03-10 DIAGNOSIS — R200 Anesthesia of skin: Secondary | ICD-10-CM | POA: Insufficient documentation

## 2024-03-10 DIAGNOSIS — G5711 Meralgia paresthetica, right lower limb: Secondary | ICD-10-CM | POA: Diagnosis not present

## 2024-03-10 NOTE — Patient Instructions (Signed)
 Please see below for information in regards to your upcoming surgery:   Planned surgery: Right lateral femoral cutaneous nerve decompression and transposition   Surgery date: (call/send mychart message when ready to schedule) at [facility]: [address] [place], [place] [address]) - you will find out your arrival time the business day before your surgery.   Pre-op appointment at [facility] Pre-admit Testing: you will receive a call with a date/time for this appointment. If you are scheduled for an in person appointment, Pre-admit Testing is located on the first floor of the [facility] building, [address], [address]. During this appointment, they will advise you which medications you can take the morning of surgery, and which medications you will need to hold for surgery. Labs (such as blood work, EKG) may be done at your pre-op appointment. You are not required to fast for these labs. Should you need to change your pre-op appointment, please call Pre-admit testing at [phone].     Surgical clearance: we will send a clearance form to [facility]. They may wish to see you in their office prior to signing the clearance form. If so, they may call you to schedule an appointment.     How to contact us :  If you have any questions/concerns before or after surgery, you can reach us  at [phone], or you can send a mychart message. We can be reached by phone or mychart 8am-4pm, Monday-Friday.  *Please note: Calls after 4pm are forwarded to a third party answering service. Mychart messages are not routinely monitored during evenings, weekends, and holidays. Please call our office to contact the answering service for urgent concerns during non-business hours.    If you have FMLA/disability paperwork, please drop it off or fax it to [phone]   Appointments/FMLA & disability paperwork: [employer] Registered Nurse/Surgery  scheduler: Deeksha Cotrell, RN Certified Medical Assistants: Don, CMA, Elenor, CMA, & Damien, CMA Physician Assistants: Lyle Decamp, PA-C, Edsel Goods, PA-C & Glade Boys, PA-C Surgeons: Penne Sharps, MD & Reeves Daisy, MD   [facility] PREADMIT TESTING VISIT and SURGERY INFORMATION SHEET   Now that surgery has been scheduled you can anticipate several phone calls from [facility] services. A pharmacy technician will call you to verify your current list of medications taken at home.               The Pre-Service Center will call to verify your insurance information and to give you billing estimates and information.             The Preadmit Testing Office will be calling to schedule a visit to obtain information for the anesthesia team and provide instructions on preparation for surgery.  What can you expect for the Preadmit Testing Visit: Appointments may be scheduled in-person or by telephone.  If a telephone visit is scheduled, you may be asked to come into the office to have lab tests or other studies performed.   This visit will not be completed any greater than 14 days prior to your surgery.  If your surgery has been scheduled for a future date, please do not be alarmed if we have not contacted you to schedule an appointment more than a month prior to the surgery date.    Please be prepared to provide the following information during this appointment:            -Personal medical history                                               -  Medication and allergy list            -Any history of problems with anesthesia              -Recent lab work or diagnostic studies            -Please notify us  of any needs we should be aware of to provide the best care possible           -You will be provided with instructions on how to prepare for your surgery.    On The Day of Surgery:  You must have a driver to take you home after surgery, you will be asked not to drive  for 24 hours following surgery.  Taxi, Gisele and non-medical transport will not be acceptable means of transportation unless you have a responsible individual who will be traveling with you.  Visitors in the surgical area:   2 people will be able to visit you in your room once your preparation for surgery has been completed. During surgery, your visitors will be asked to wait in the Surgery Waiting Area.  It is not a requirement for them to stay, if they prefer to leave and come back.  Your visitor(s) will be given an update once the surgery has been completed.  No visitors are allowed in the initial recovery room to respect patient privacy and safety.  Once you are more awake and transfer to the secondary recovery area, or are transferred to an inpatient room, visitors will again be able to see you.  To respect and protect your privacy: We will ask on the day of surgery who your driver will be and what the contact number for that individual will be. We will ask if it is okay to share information with this individual, or if there is an alternative individual that we, or the surgeon, should contact to provide updates and information. If family or friends come to the surgical information desk requesting information about you, who you have not listed with us , no information will be given.   It may be helpful to designate someone as the main contact who will be responsible for updating your other friends and family.    PREADMIT TESTING OFFICE: [phone] SAME DAY SURGERY: [phone] We look forward to caring for you before and throughout the process of your surgery.

## 2024-03-12 ENCOUNTER — Telehealth: Payer: Self-pay | Admitting: Neurosurgery

## 2024-03-12 NOTE — Telephone Encounter (Signed)
 Patient is calling to let our office know that she would like to proceed with scheduling surgery. She would prefer November 6th if available or the 13th.

## 2024-03-13 ENCOUNTER — Other Ambulatory Visit: Payer: Self-pay

## 2024-03-13 DIAGNOSIS — G5711 Meralgia paresthetica, right lower limb: Secondary | ICD-10-CM

## 2024-03-13 DIAGNOSIS — R200 Anesthesia of skin: Secondary | ICD-10-CM

## 2024-03-13 DIAGNOSIS — M79604 Pain in right leg: Secondary | ICD-10-CM

## 2024-03-13 DIAGNOSIS — Z01818 Encounter for other preprocedural examination: Secondary | ICD-10-CM

## 2024-03-14 ENCOUNTER — Telehealth (HOSPITAL_BASED_OUTPATIENT_CLINIC_OR_DEPARTMENT_OTHER): Payer: Self-pay | Admitting: *Deleted

## 2024-03-14 NOTE — Telephone Encounter (Signed)
 I spoke with the patient. I confirmed that she is scheduled for surgery on 04/17/24. I encouraged her to call cardiology to schedule an appointment with them, as they left her a message. She inquired about a phone call for her PAT appointment, I encouraged her to call PAT to inquire if this would be an option.

## 2024-03-14 NOTE — Telephone Encounter (Signed)
   Name: KATHRINA CROSLEY  DOB: 1950/06/14  MRN: [id number]  Primary Cardiologist: Aleene Passe, MD (Inactive)  Chart reviewed as part of pre-operative protocol coverage. Because of Tishina Lown Rzepka's past medical history and time since last visit, she will require a follow-up in-office visit in order to better assess preoperative cardiovascular risk.  Pre-op covering staff: - Please schedule appointment and call patient to inform them. If patient already had an upcoming appointment within acceptable timeframe, please add pre-op clearance to the appointment notes so provider is aware. - Please contact requesting surgeon's office via preferred method (i.e, phone, fax) to inform them of need for appointment prior to surgery.   Lamarr Satterfield, NP  03/14/2024, 9:43 AM

## 2024-03-14 NOTE — Telephone Encounter (Signed)
 Left message to call back to schedule appt in office for preop clearance

## 2024-03-14 NOTE — Telephone Encounter (Signed)
   Pre-operative Risk Assessment    Patient Name: Courtney [name]  DOB: [birth year] MRN: [id number]   Date of last office visit: 04/11/23 Courtney [name], [facility] Date of next office visit: NONE   Request for Surgical Clearance    Procedure:  Right lateral femoral cutaneous nerve decompression and transposition  Date of Surgery:  Clearance 04/17/24                                Surgeon:  DR. PENNE SHARPS Surgeon's Group or Practice Name:  [facility] Phone number:  [phone]  Fax number:  [phone]   Type of Clearance Requested:   - Medical NONE INDICATED ON FORM TO BE HELD   Type of Anesthesia:  MAC   Additional requests/questions:    Courtney [name]   03/14/2024, 9:37 AM   Letter  SHARPS PENNE ORN, MD on 03/13/2024     [facility] Neurosurgery at [facility] [address], [address] [place], [place]  [address] Phone:  [phone]   Fax:  [phone]   Patient: Courtney [name]                 DOB: 10/31/1950   Date sent: 03/13/2024              Faxed to: Davene Nicolas   Surgeon: PENNE SHARPS, MD    Date of procedure: 04/17/2024   Planned procedure & Anesthesia Type: Right lateral femoral cutaneous nerve decompression and transposition/monitored anesthesia care (MAC)     Should you choose to see this patient in your office to provide clearance, please ask your office staff to contact the patient directly.  Please fax your evaluation and any supporting documentation as soon as completed.  Thank you! Your assistance is greatly appreciated!       Is this patient optimized to have the above procedure?   Yes   or   No   If NO, please indicate further studies/evaluations recommended     Risk:    Low    Moderate    High     Remarks:        Physician Name: _______________________________        Physician Signature: _______________________________ Date:________________    [facility] Neurosurgery at [facility], [id number]                                   1

## 2024-03-17 NOTE — Telephone Encounter (Signed)
 LVM asking pt to call our office to schedule OV for preop clearance. 2nd attempt. Please reach out to me or preop team if pt returns call thank you.

## 2024-03-17 NOTE — Telephone Encounter (Signed)
 Called pt back to schedule in office appt for preop clearance. She can let the operator know when she calls back she needs appt in office for preop clearance.

## 2024-03-17 NOTE — Telephone Encounter (Signed)
Pt returning call regarding Clearance. Please advise

## 2024-03-18 NOTE — Telephone Encounter (Signed)
 3rd attempt to reach the pt to schedule an in office appt for preop clearance. I will update the requesting office the pt needs in office appt.

## 2024-03-20 NOTE — Telephone Encounter (Signed)
 Pt scheduled to see [name], 03/14/24.  Will route to requesting surgeon's office to make them aware.

## 2024-03-20 NOTE — Telephone Encounter (Signed)
 FYI Pt called in and made appt with GORMAN Ferrier on 04/02/24 At 1:55

## 2024-04-01 ENCOUNTER — Encounter: Payer: Self-pay | Admitting: *Deleted

## 2024-04-02 ENCOUNTER — Ambulatory Visit: Attending: Physician Assistant | Admitting: Physician Assistant

## 2024-04-02 ENCOUNTER — Encounter: Payer: Self-pay | Admitting: Physician Assistant

## 2024-04-02 VITALS — BP 116/68 | HR 81 | Ht 64.0 in | Wt 124.4 lb

## 2024-04-02 DIAGNOSIS — I350 Nonrheumatic aortic (valve) stenosis: Secondary | ICD-10-CM | POA: Diagnosis not present

## 2024-04-02 DIAGNOSIS — Q2381 Bicuspid aortic valve: Secondary | ICD-10-CM

## 2024-04-02 DIAGNOSIS — Z01810 Encounter for preprocedural cardiovascular examination: Secondary | ICD-10-CM | POA: Diagnosis not present

## 2024-04-02 DIAGNOSIS — E7800 Pure hypercholesterolemia, unspecified: Secondary | ICD-10-CM | POA: Diagnosis not present

## 2024-04-02 NOTE — Assessment & Plan Note (Addendum)
 Mild AS w mean gradient 11 mmHg by TTE in 2023. CT in 2021 with normal caliber ascending thoracic aorta. Asymptomatic with no chest pain, shortness of breath, or syncope. Plan was to see her in 2026. But since she is here today, will arrange her routine follow up echocardiogram now and see her back in 2 years. Echocardiogram is not needed before her surgery.  - Order echocardiogram   - Schedule follow-up in two years unless symptoms develop (me or Dr. Wonda)

## 2024-04-02 NOTE — Patient Instructions (Signed)
 Medication Instructions:  Your physician recommends that you continue on your current medications as directed. Please refer to the Current Medication list given to you today.  *If you need a refill on your cardiac medications before your next appointment, please call your pharmacy*  Lab Work: None Ordered If you have labs (blood work) drawn today and your tests are completely normal, you will receive your results only by: MyChart Message (if you have MyChart) OR A paper copy in the mail If you have any lab test that is abnormal or we need to change your treatment, we will call you to review the results.  Testing/Procedures: Your provider is recommending you get an ECHOCARDIOGRAM  Your physician has requested that you have an echocardiogram. Echocardiography is a painless test that uses sound waves to create images of your heart. It provides your doctor with information about the size and shape of your heart and how well your heart's chambers and valves are working. This procedure takes approximately one hour. There are no restrictions for this procedure. Please do NOT wear cologne, perfume, aftershave, or lotions (deodorant is allowed). Please arrive 15 minutes prior to your appointment time.  Please note: We ask at that you not bring children with you during ultrasound (echo/ vascular) testing. Due to room size and safety concerns, children are not allowed in the ultrasound rooms during exams. Our front office staff cannot provide observation of children in our lobby area while testing is being conducted. An adult accompanying a patient to their appointment will only be allowed in the ultrasound room at the discretion of the ultrasound technician under special circumstances. We apologize for any inconvenience.   Follow-Up: At [facility], you and your health needs are our priority.  As part of our continuing mission to provide you with exceptional heart care, our providers are all  part of one team.  This team includes your primary Cardiologist (physician) and Advanced Practice Providers or APPs (Physician Assistants and Nurse Practitioners) who all work together to provide you with the care you need, when you need it.  Your next appointment:   2 year(s)  Provider:   [name], PA-C          We recommend signing up for the patient portal called MyChart.  Sign up information is provided on this After Visit Summary.  MyChart is used to connect with patients for Virtual Visits (Telemedicine).  Patients are able to view lab/test results, encounter notes, upcoming appointments, etc.  Non-urgent messages can be sent to your provider as well.   To learn more about what you can do with MyChart, go to [email or web address].

## 2024-04-02 NOTE — Progress Notes (Signed)
 OFFICE NOTE:    Date:  04/02/2024  ID:  Courtney [name], DOB [birth year], MRN [id number] PCP: Courtney Velna SAUNDERS, MD  [facility] Providers Cardiologist:  Aleene Passe, MD (Inactive)        Bicuspid aortic valve CTA 10/14/19: normal caliber ascending thoracic aorta  TTE 09/21/21: EF 60-65, no RWMA, NL RVSF, NL PASP, RVSP 21.5, mild MR, bicuspid AV w mild AS (mean 11, Vmax 231 cm/s, DI 0.47) Hyperlipidemia   OSA         Discussed the use of AI scribe software for clinical note transcription with the patient, who gave verbal consent to proceed. History of Present Illness Courtney [name] is a 73 y.o. female who returns for surgical clearance. Last OV in 03/2023. She needs right lateral femoral cutaneous nerve decompression with Dr. Penne Sharps 04/17/24 under conscious sedation.   Her physical activity is limited due to back issues, but she remains active around her house and yard. She experiences occasional lightheadedness when bending over and standing up, a symptom present since childhood. She reports mild swelling in one leg, which she attributes to previous femoral nerve damage. She has not had chest pain, shortness of breath, syncope.     ROS-See HPI    Studies Reviewed:             Physical Exam:  VS:  BP 116/68   Pulse 81   Ht 5' 4 (1.626 m)   Wt 124 lb 6.4 oz (56.4 kg)   LMP  (LMP Unknown)   SpO2 98%   BMI 21.35 kg/m        Wt Readings from Last 3 Encounters:  04/02/24 124 lb 6.4 oz (56.4 kg)  03/10/24 127 lb (57.6 kg)  04/11/23 131 lb 12.8 oz (59.8 kg)    Constitutional:      Appearance: Healthy appearance. Not in distress.  Neck:     Vascular: JVD normal.  Pulmonary:     Breath sounds: Normal breath sounds. No wheezing. No rales.  Cardiovascular:     Normal rate. Regular rhythm.     Murmurs: There is a grade 2/6 crescendo-decrescendo systolic murmur at the URSB.  Edema:    Peripheral edema present.    Ankle: trace edema of the right  ankle. Abdominal:     Palpations: Abdomen is soft.       Assessment and Plan:    Assessment & Plan Preoperative cardiovascular examination [name]'s perioperative risk of a major cardiac event is 0.4% according to the Revised Cardiac Risk Index (RCRI).  Therefore, she is at low risk for perioperative complications.   Her functional capacity is good at 4.31 METs according to the Duke Activity Status Index (DASI). Recommendations: According to ACC/AHA guidelines, no further cardiovascular testing needed.  The patient may proceed to surgery at acceptable risk.   Bicuspid aortic valve Mild aortic stenosis Mild AS w mean gradient 11 mmHg by TTE in 2023. CT in 2021 with normal caliber ascending thoracic aorta. Asymptomatic with no chest pain, shortness of breath, or syncope. Plan was to see her in 2026. But since she is here today, will arrange her routine follow up echocardiogram now and see her back in 2 years. Echocardiogram is not needed before her surgery.  - Order echocardiogram   - Schedule follow-up in two years unless symptoms develop (me or Dr. Wonda) Pure hypercholesterolemia Managed by PCP.         Dispo:  Return in about 2 years (around  04/02/2026) for Routine Follow Up, w/ [name], PA-C.  Signed, [name], PA-C

## 2024-04-02 NOTE — Assessment & Plan Note (Signed)
 Managed by PCP

## 2024-04-07 ENCOUNTER — Other Ambulatory Visit: Payer: Self-pay | Admitting: Medical Genetics

## 2024-04-07 DIAGNOSIS — Z006 Encounter for examination for normal comparison and control in clinical research program: Secondary | ICD-10-CM

## 2024-04-07 NOTE — Telephone Encounter (Signed)
 Patient is calling for update, to see if the clearance been sent. Please advise

## 2024-04-07 NOTE — Telephone Encounter (Signed)
 According to Glendia Bur note on 04/02/2024 the patient may proceed to surgery at acceptable risk.   Will fax office note to requesting office.

## 2024-04-09 DIAGNOSIS — M549 Dorsalgia, unspecified: Secondary | ICD-10-CM | POA: Diagnosis not present

## 2024-04-10 ENCOUNTER — Encounter: Payer: Self-pay | Admitting: Neurosurgery

## 2024-04-10 ENCOUNTER — Other Ambulatory Visit: Payer: Self-pay

## 2024-04-10 ENCOUNTER — Encounter
Admission: RE | Admit: 2024-04-10 | Discharge: 2024-04-10 | Disposition: A | Discharge status: Home / Self Care | Source: Ambulatory Visit | Attending: Neurosurgery | Admitting: Neurosurgery

## 2024-04-10 HISTORY — DX: Cardiac murmur, unspecified: R01.1

## 2024-04-10 NOTE — Progress Notes (Signed)
 Patient requested to have blood work done on day of surgery and declines to have EKG done as her cardiologist already cleared her for surgery and she will have echo done sometime in December. Per patient she does not have cardiac symptoms, therefore EKG is not necessary at this time.

## 2024-04-10 NOTE — Patient Instructions (Addendum)
 Your procedure is scheduled on: 04/17/2024  Report to the Registration Desk on the 1st floor of the [facility]. To find out your arrival time, please call [phone] between 1PM - 3PM on: 04/16/2024  If your arrival time is 6:00 am, do not arrive before that time as the [facility] entrance doors do not open until 6:00 am.  REMEMBER: Instructions that are not followed completely may result in serious medical risk, up to and including death; or upon the discretion of your surgeon and anesthesiologist your surgery may need to be rescheduled.  Do not eat food after midnight the night before surgery.  No gum chewing or hard candies.  You may however, drink CLEAR liquids up to 2 hours before you are scheduled to arrive for your surgery. Do not drink anything within 2 hours of your scheduled arrival time.  Clear liquids include: - water   - apple juice without pulp - gatorade (not RED colors) - black coffee or tea (Do NOT add milk or creamers to the coffee or tea) Do NOT drink anything that is not on this list.    One week prior to surgery: Stop Anti-inflammatories (NSAIDS) such as Advil, Aleve, Ibuprofen, Motrin, Naproxen, Naprosyn and Aspirin  based products such as Excedrin, Goody's Powder, BC Powder. Stop ANY OVER THE COUNTER supplements until after surgery    You may however, continue to take Tylenol  if needed for pain up until the day of surgery.   Continue taking all of your other prescription medications up until the day of surgery.  ON THE DAY OF SURGERY ONLY TAKE THESE MEDICATIONS WITH SIPS OF WATER :  atorvastatin  (LIPITOR)  DULoxetine  (CYMBALTA )  GEMTESA  levothyroxine   methocarbamol   oxycodone    No Alcohol  for 24 hours before or after surgery.  No Smoking including e-cigarettes for 24 hours before surgery.  No chewable tobacco products for at least 6 hours before surgery.  No nicotine patches on the day of surgery.  Do not use any recreational drugs for at  least a week (preferably 2 weeks) before your surgery.  Please be advised that the combination of cocaine and anesthesia may have negative outcomes, up to and including death. If you test positive for cocaine, your surgery will be cancelled.  On the morning of surgery brush your teeth with toothpaste and water , you may rinse your mouth with mouthwash if you wish. Do not swallow any toothpaste or mouthwash.  Use CHG Soap or dial when showering on day of surgery.  Do not wear jewelry, make-up, hairpins, clips or nail polish.   Do not wear lotions, powders, or perfumes.   Do not shave body hair from the neck down 48 hours before surgery.  Contact lenses, hearing aids and dentures may not be worn into surgery.  Do not bring valuables to the hospital. [facility] is not responsible for any missing/lost belongings or valuables.    Bring your C-PAP to the hospital in case you may have to spend the night.   Notify your doctor if there is any change in your medical condition (cold, fever, infection).  Wear comfortable clothing (specific to your surgery type) to the hospital.  After surgery, you can help prevent lung complications by doing breathing exercises.  Take deep breaths and cough every 1-2 hours. Your doctor may order a device called an Incentive Spirometer to help you take deep breaths.  If you are being admitted to the hospital overnight, leave your suitcase in the car. After surgery it may be  brought to your room.   If you are being discharged the day of surgery, you will not be allowed to drive home. You will need a responsible individual to drive you home and stay with you for 24 hours after surgery.     Please call the Pre-admissions Testing Dept. at [phone] if you have any questions about these instructions.  Surgery Visitation Policy:  Patients having surgery or a procedure may have two visitors.  Children under the age of 73 must have an adult with them who  is not the patient.  Inpatient Visitation:    Visiting hours are 7 a.m. to 8 p.m. Up to four visitors are allowed at one time in a patient room. The visitors may rotate out with other people during the day.  One visitor age 31 or older may stay with the patient overnight and must be in the room by 8 p.m.   Merchandiser, Retail to address health-related social needs:  [email or web address]

## 2024-04-11 ENCOUNTER — Telehealth: Payer: Self-pay

## 2024-04-11 ENCOUNTER — Encounter: Payer: Self-pay | Admitting: Urgent Care

## 2024-04-11 NOTE — Telephone Encounter (Signed)
 Per Courtney Pereyra, NP, he discussed with the anesthesiologist on staff today, who stated We will place the monitor in the OR... if there is anything of concern, the case will be aborted. Patient will be consented well with the understanding that she is placing herself at risk for increased morbidity/mortality due to her refusing to allow for preoperative cardiac assessment.  That being said, the anesthesiologist that is working on the day of surgery may disagree with the above plan and say she cannot have surgery without it.  Dr Claudene is aware of the above plan.  I have notified the patient via mychart.

## 2024-04-11 NOTE — Telephone Encounter (Signed)
 Ms Menser called our office to express concern regarding the need for an EKG prior to her surgery. She states she saw her cardiologist last week for clearance and was told she didn't need an echo and her heart is fine. She feels getting an EKG prior to surgery is an unnecessary test and isn't evidenced based.   I explained that her previous EKG is more than a year old and the requirement for an updated EKG is for her safety. She states that she is willing to sign a waiver for the risks of not obtaining an updated EKG. Per discussion with Dorise Pereyra, NP, I informed her that without an updated EKG, she may be sent home and surgery may be canceled.  She requested Dr Theressa opinion and I informed her that I discussed her concerns with Dr Claudene and he stated, I don't know the evidence well enough to speak on that.   At the end of the discussion, it was decided that Dorise would discuss her case with the anesthesiologists.

## 2024-04-14 ENCOUNTER — Encounter: Payer: Self-pay | Admitting: Neurosurgery

## 2024-04-14 NOTE — Progress Notes (Incomplete)
 Perioperative / Anesthesia Services  Pre-Admission Testing Clinical Review / Pre-Operative Anesthesia Consult  Date: 04/14/24  PATIENT DEMOGRAPHICS: Name: Courtney [name] DOB: [birth year] MRN:   [id number]  Note: Available PAT nursing documentation and vital signs have been reviewed. Clinical nursing staff has updated patient's PMH/PSHx, current medication list, and drug allergies/intolerances to ensure complete and comprehensive history available to assist care teams in MDM as it pertains to the aforementioned surgical procedure and anticipated anesthetic course. Extensive review of available clinical information personally performed. Nursing documentation reviewed. [place] [facility] and PSHx updated with any diagnoses and/or procedures that I have knowledge of that may have been inadvertently omitted during her intake with the pre-admission testing department's nursing staff.  PLANNED SURGICAL PROCEDURE(S):   Case: [id number] Date/Time: 04/17/24 1411   Procedure: DECOMPRESSION, NERVE, FEMORAL (Right) - RIGHT SIDE LATERAL FEMORAL CUTANEOUS NERVE DECOMPRESSION AND TRANSPOSITION   Anesthesia type: Monitor Anesthesia Care   Diagnosis:      Meralgia paraesthetica, right [G57.11]     Right leg numbness [R20.0]     Right leg pain [M79.604]   Pre-op diagnosis: Meralgia Paresthetica, Right   Location: [facility] OR ROOM 03 / [facility]   Surgeons: Claudene Penne ORN, MD        CLINICAL DISCUSSION: Courtney [name] is a 73 y.o. female who is submitted for pre-surgical anesthesia review and clearance prior to her undergoing the above procedure. Patient is a Former Smoker (1 pack years; quit 06/1974). Pertinent PMH includes: bicuspid aortic valve resulting in aortic stenosis, cardiac murmur, HLD, hypothyroidism, OSAH (no nocturnal PAP therapy), GERD (no daily Tx), anemia, OA, fibromyalgia, cervical DDD (s/p ACDF C5-C7), lumbar DDD (s/p L3-L5 fusion and revision L2-S1 laminectomy),  meralgia paresthetica, insomnia, chronic opioid therapy, anxiety, depression, medical noncompliance.  Patient is followed by cardiology Bonni, MD). She was last seen in the cardiology clinic on 04/02/2024; notes reviewed. At the time of her clinic visit, patient doing well overall from a cardiovascular perspective.  Patient with positional related dizziness that has been ongoing since childhood.  Patient also with mild swelling in 1 leg, which she attributes to previous femoral nerve damage.  Patient denied any chest pain, shortness of breath, PND, orthopnea, palpitations,  weakness, fatigue, or presyncope/syncope. Patient with a past medical history significant for cardiovascular diagnoses. Documented physical exam was grossly benign, providing no evidence of acute exacerbation and/or decompensation of the patient's known cardiovascular conditions.  Most recent TTE performed on 09/21/2021 revealed a normal left ventricular systolic function with an EF of 60-65%. There was no LVH.  There were no regional wall motion abnormalities.  Left ventricular diastolic Doppler parameters were indeterminant.  Right ventricular size and function normal with a TAPSE measuring 2.0 cm  (normal range >/= 1.6 cm).  RVSP = 21.5 mmHg. aortic valve noted to be bicuspid with moderate thickening resulting in mild stenosis; mean pressure gradient = 11 mmHg; AVA (VTI) = 1.20 cm.  There was mild mitral valve regurgitation.  All remaining transvalvular gradients were noted to be normal providing no evidence of hemodynamically significant valvular stenosis. Aorta normal in size with no evidence of ectasia or aneurysmal dilatation.  Blood pressure well controlled at 116/68; takes no oral antihypertensive medications. Patient is on atorvastatin  for her HLD diagnosis and ASCVD prevention. Patient is not diabetic. She does have an OSAH diagnosis, however does not require the use of nocturnal PAP therapy.  Functional capacity limited by  back pain, however patient makes an effort to remain active.  She is able to complete all of her ADLs/IADLs without cardiovascular limitation.  Per the DASI, patient able to exceed 4 METS of physical activity without experiencing any significant angina/anginal equivalent symptoms.  No changes were made to her medication regimen during her visit with cardiology.  Patient scheduled to follow-up with outpatient cardiology in 24 months or sooner if needed.  Courtney [name] is scheduled for an elective DECOMPRESSION, NERVE, FEMORAL (Right) on 04/17/2024 with Dr. Penne LELON Sharps, MD. Given patient's past medical history significant for cardiovascular diagnoses, presurgical cardiac clearance was sought by the PAT team. Per cardiology, Ms. Horace's perioperative risk of a major cardiac event is 0.4% according to the Revised Cardiac Risk Index (RCRI).  Therefore, she is at low risk for perioperative complications.   Her functional capacity is good at 4.31 METs according to the [place] Activity Status Index (DASI). According to ACC/AHA guidelines, no further cardiovascular testing needed.  The patient may proceed to surgery at ACCEPTABLE risk.  In review of her medication reconciliation, the patient is not noted to be taking any type of anticoagulation or antiplatelet therapies that would need to be held during her perioperative course.  Patient denies previous perioperative complications with anesthesia in the past. In review her EMR, it is noted that patient underwent a general anesthetic course at [facility] ([facility]) in 12/2023 without documented complications.   MOST RECENT VITAL SIGNS:    04/02/2024    1:57 PM 03/10/2024    2:05 PM 04/11/2023    8:33 AM  Vitals with BMI  Height 5' 4 5' 4 5' 4  Weight 124 lbs 6 oz 127 lbs 131 lbs 13 oz  BMI 21.34 21.79 22.61  Systolic 116 138 889  Diastolic 68 70 78  Pulse 81  69   PROVIDERS/SPECIALISTS: NOTE: Primary physician provider listed below.  Patient may have been seen by APP or partner within same practice.   PROVIDER ROLE / SPECIALTY LAST SHERLEAN Sharps Penne LELON, MD Neurosurgery (Surgeon) 03/10/2024  Vernon Velna SAUNDERS, MD Primary Care Provider ???  Alveta Mungo, MD Cardiology 04/02/2024   ALLERGIES: Allergies  Allergen Reactions   Fish Allergy Anaphylaxis    Can tolerate shellfish (can eat tuna, salmon, trout, swordfish and redrum). Allergic to fish with scales and fins.   Peanut Butter Flavoring Agent (Non-Screening) Anaphylaxis    Allergic to peanuts and some tree nuts. Can eat cashews, pistachios, and almonds.    Hazelnut (Filbert) Other (See Comments)   Other Other (See Comments)    Unknown.  Tree nuts -- most but not all    CURRENT HOME MEDICATIONS: No current facility-administered medications for this encounter.    acetaminophen  (TYLENOL ) 500 MG tablet   Ascorbic Acid  (VITAMIN C) 1000 MG tablet   atorvastatin  (LIPITOR) 10 MG tablet   Biotin 5000 MCG CAPS   CALCIUM  CITRATE PO   Carboxymethylcellul-Glycerin  (REFRESH OPTIVE OP)   cholecalciferol  (VITAMIN D3) 25 MCG (1000 UNIT) tablet   clobetasol cream (TEMOVATE) 0.05 %   Cyanocobalamin (VITAMIN B 12 PO)   diclofenac Sodium (VOLTAREN) 1 % GEL   DULoxetine  (CYMBALTA ) 30 MG capsule   EPINEPHrine  (EPIPEN  JR) 0.15 MG/0.3ML injection   estradiol (ESTRACE) 0.1 MG/GM vaginal cream   FERROUS BISGLYCINATE  CHELATE PO   ferrous sulfate 325 (65 FE) MG tablet   folic acid (FOLVITE) 1 MG tablet   GEMTESA 75 MG TABS   levothyroxine  (SYNTHROID , LEVOTHROID) 25 MCG tablet   lidocaine  4 %   meloxicam (MOBIC) 15 MG tablet  methocarbamol  (ROBAXIN ) 500 MG tablet   MIEBO 1.338 GM/ML SOLN   Multiple Vitamin (MULTIVITAMIN) tablet   naloxone (NARCAN) nasal spray 4 mg/0.1 mL   Oxycodone  HCl 10 MG TABS   Polyethyl Glyc-Propyl Glyc PF (SYSTANE HYDRATION PF) 0.4-0.3 % SOLN   polyethylene glycol (MIRALAX  / GLYCOLAX ) 17 g packet   Specialty Vitamins Products (BLINK NUTRITEARS) CAPS    Tart Cherry 1200 MG CAPS   traZODone  (DESYREL ) 100 MG tablet   urea (CARMOL) 40 % CREA   Vitamin E (VITAMIN E/D-ALPHA NATURAL) 268 MG (400 UNIT) CAPS   HISTORY: Past Medical History:  Diagnosis Date   ADHD    ADD   Allergic to pets    Allergies    Anemia    Anxiety    Aortic stenosis due to bicuspid aortic valve    bicuspid AV with mild AS, ascending aorta 3.40 cm 09/21/21 echo   Arthritis    hands, hip, shoulders back,   Childhood asthma    Complication of anesthesia    a.) delayed and prolonged emergence; it lingers for a long time   Concussion    Depression    Family history of adverse reaction to anesthesia    mom had skin reaction to anesthesia but cant remember what it was or exactly what happened   Fatigue    Fibromyalgia    GERD (gastroesophageal reflux disease)    Heart murmur    Hyperlipidemia    Hypothyroidism    Irregular periods    Lower back pain    Meralgia paresthetica, right    Migraines    Numbness and tingling    Osteoarthritis    Presence of pessary    Ringing in ears    Sleep apnea    a.) no nocturnal PAP therapy; utilizes nocturnal mouth taping   Snoring    Past Surgical History:  Procedure Laterality Date   ABDOMINAL EXPOSURE N/A 04/12/2020   Procedure: ABDOMINAL EXPOSURE;  Surgeon: Gretta Lonni PARAS, MD;  Location: [facility] OR;  Service: Vascular;  Laterality: N/A;   ABDOMINOPLASTY     ANTERIOR CERVICAL DECOMP/DISCECTOMY FUSION N/A 06/28/2016   Procedure: ACDF C5-7 ANTERIOR CERVICAL DECOMPRESSION/DISCECTOMY FUSION 2 LEVELS;  Surgeon: Donaciano Sprang, MD;  Location: [facility] OR;  Service: Orthopedics;  Laterality: N/A;  Requests 3 hrs   ANTERIOR LUMBAR FUSION N/A 04/12/2020   Procedure: Anterior Lumbar Interbody Fusion - Lumbar four-Lumbar five , posterior instrumented fusion Lumbar four-five lumbar three -four;  Surgeon: Joshua Alm RAMAN, MD;  Location: [facility] OR;  Service: Neurosurgery;  Laterality: N/A;   BREAST BIOPSY Right 09/19/2021   CARPAL TUNNEL  RELEASE Bilateral 1996   CATARACT EXTRACTION Bilateral    03/08/22 and 03/15/2022   COLONOSCOPY     DENTAL SURGERY     HERNIA REPAIR     times 2   LAMINECTOMY WITH POSTERIOR LATERAL ARTHRODESIS LEVEL 1 N/A 04/12/2020   Procedure: Posterior Instrumented Fusion Lumbar three to lumbar five;  Surgeon: Joshua Alm RAMAN, MD;  Location: [facility] OR;  Service: Neurosurgery;  Laterality: N/A;   LUMBAR LAMINECTOMY/DECOMPRESSION MICRODISCECTOMY  04/21/2015   Procedure: DECOMPRESSION LUMBAR THREE-LUMBAR FOUR;  Surgeon: Donaciano Sprang, MD;  Location: [facility] OR;  Service: Orthopedics;;   LUMBAR LAMINECTOMY/DECOMPRESSION MICRODISCECTOMY N/A 06/20/2017   Procedure: L4-5 decompression, L5-S1 left laminotomy/foraminotomy;  Surgeon: Sprang Donaciano, MD;  Location: [facility] OR;  Service: Orthopedics;  Laterality: N/A;  3 hrs   PARATHYROIDECTOMY  2012   RADIOACTIVE SEED GUIDED EXCISIONAL BREAST BIOPSY Right 10/28/2021   Procedure: RADIOACTIVE SEED  GUIDED EXCISIONAL RIGHT BREAST BIOPSY;  Surgeon: Aron Shoulders, MD;  Location: [facility];  Service: General;  Laterality: Right;   SACROILIAC JOINT FUSION Right 07/24/2018   Procedure: SACROILIAC JOINT FUSION;  Surgeon: Burnetta Aures, MD;  Location: [facility] OR;  Service: Orthopedics;  Laterality: Right;  90 mins   SACROILIAC JOINT FUSION Left 01/12/2022   Procedure: SPINAL CORD STIMULATOR PLACEMENT, LEFT SACROILIAC FUSION;  Surgeon: Burnetta Aures, MD;  Location: [facility] OR;  Service: Orthopedics;  Laterality: Left;   SHOULDER ARTHROSCOPY W/ ACROMIAL REPAIR Right    SPINAL CORD STIMULATOR INSERTION Left 01/12/2022   Procedure: LUMBAR SPINAL CORD STIMULATOR INSERTION;  Surgeon: Burnetta Aures, MD;  Location: [facility] OR;  Service: Orthopedics;  Laterality: Left;   TONSILLECTOMY     TOTAL HIP ARTHROPLASTY Right 11/10/2020   Procedure: TOTAL HIP ARTHROPLASTY ANTERIOR APPROACH;  Surgeon: Melodi Lerner, MD;  Location: [facility] ORS;  Service: Orthopedics;  Laterality: Right;    Family  History  Problem Relation Age of Onset   Breast cancer Mother 63 - 88   COPD Mother    Cancer Mother    Hypertension Mother    Hyperlipidemia Mother    Heart disease Mother    Thyroid  disease Mother    Thyroid  disease Sister    Thyroid  disease Daughter    Pseudotumor cerebri Neg Hx    BRCA 1/2 Neg Hx    Social History   Tobacco Use   Smoking status: Former    Current packs/day: 0.00    Average packs/day: 0.3 packs/day for 4.0 years (1.0 ttl pk-yrs)    Types: Cigarettes    Start date: 76    Quit date: 68    Years since quitting: 49.8   Smokeless tobacco: Never   Tobacco comments:    social smoker stop at age 62  Substance Use Topics   Alcohol  use: Yes    Alcohol /week: 1.0 - 2.0 standard drink of alcohol     Types: 1 - 2 Glasses of wine per week    Comment: socially   LABS:  Lab Results  Component Value Date   WBC 4.1 04/20/2022   HGB 11.6 (L) 04/20/2022   HCT 34.5 (L) 04/20/2022   MCV 99.1 04/20/2022   PLT 173 04/20/2022   Lab Results  Component Value Date   NA 139 04/20/2022   CL 106 04/20/2022   K 4.6 04/20/2022   CO2 26 04/20/2022   BUN 16 04/20/2022   CREATININE 0.77 04/20/2022   GFRNONAA >60 04/20/2022   CALCIUM  9.8 04/20/2022   ALBUMIN  3.9 04/21/2022   GLUCOSE 109 (H) 04/20/2022    ECG: Date: 04/11/2023  Time ECG obtained: 0842 AM Rate: 69 bpm Rhythm: normal sinus Axis (leads I and aVF): normal Intervals: PR 152 ms. QRS 88 ms. QTc 402 ms. ST segment and T wave changes: Chronic T wave inversions in leads I and aVL Evidence of a possible, age undetermined, prior infarct:  No Comparison: Similar to previous tracing obtained on 09/02/2021 Notes: ECG OOD for upcoming procedure, however patient adamantly refuses to have repeat tracing performed.   IMAGING / PROCEDURES: MR LUMBAR SPINE WO CONTRAST performed on 04/10/2023 L3-L5 fusion that is solid by CT. Adjacent segment degeneration greater at L2-3 where there is advanced spinal stenosis and  moderate biforaminal impingement.   TRANSTHORACIC ECHOCARDIOGRAM performed on 09/21/2021 Left ventricular ejection fraction, by estimation, is 60 to 65%. The left ventricle has normal function. The left ventricle has no regional wall motion abnormalities. Left ventricular diastolic  function could not be evaluated.  Right ventricular systolic function is normal. The right ventricular size is normal. There is normal pulmonary artery systolic pressure. The estimated right ventricular systolic pressure is 21.5 mmHg.  The mitral valve is normal in structure. Mild mitral valve regurgitation.  The aortic valve is bicuspid. There is moderate calcification of the aortic valve. There is moderate thickening of the aortic valve. Aortic valve regurgitation is not visualized. Mild aortic valve stenosis. Aortic valve mean gradient measures 11.0 mmHg. Aortic valve Vmax measures 2.31 m/s.  No coarctation is seen.   IMPRESSION AND PLAN: ANALYNN DAUM has been referred for pre-anesthesia review and clearance prior to her undergoing the planned anesthetic and procedural courses. Available labs, pertinent testing, and imaging results were personally reviewed by me in preparation for upcoming operative/procedural course. [facility] medical record has been updated following extensive record review and patient interview with PAT staff.   ECG AND LABS OOD for upcoming procedure, however patient adamantly refuses to come into PAT office to have it performed prior to surgery.  In fact, patient refusing to have repeat tracing performed at all.  Patient cites that this is a needless test and it is not required by any professional guidelines.  Patient states that she will sign a waiver stating that she understands her risk of foregoing this test as it pertains to her upcoming surgical/anesthetic course.  Discussed this with anesthesia.  Recent testing, ECG, and cardiology clearance taken into consideration.  Per attending  anesthesiologist (Mazzoni, MD), anesthesia service amenable to proceeding, however patient will need to understand that continuous cardiac monitor will be placed in the OR.  If concerns are noted, case will be aborted.  Patient is aware that lab testing will absolutely be required prior to her being allowed to proceed on the day of her procedure. Patient was updated on the stipulations from anesthesia by her surgeon's office and she is in agreement.  This patient has been appropriately cleared by cardiology with an overall ACCEPTABLE risk of patient experiencing significant perioperative cardiovascular complications. Based on clinical review performed today (04/14/24), barring any significant acute changes in the patient's overall condition, it is anticipated that she will be able to proceed with the planned surgical intervention. Any acute changes in clinical condition may necessitate her procedure being postponed and/or cancelled. Patient will meet with anesthesia team (MD and/or CRNA) on the day of her procedure for preoperative evaluation/assessment. Questions regarding anesthetic course will be fielded at that time.   Pre-surgical instructions were reviewed with the patient during his PAT appointment, and questions were fielded to satisfaction by PAT clinical staff. She has been instructed on which medications that she will need to hold prior to surgery, as well as the ones that have been deemed safe/appropriate to take on the day of her procedure. As part of the general education provided by PAT, patient made aware both verbally and in writing, that she would need to abstain from the use of any illegal substances during her perioperative course. She was advised that failure to follow the provided instructions could necessitate case cancellation or result in serious perioperative complications up to and including death. Patient encouraged to contact PAT and/or her surgeon's office to discuss any questions  or concerns that may arise prior to surgery; verbalized understanding.   Dorise Pereyra, MSN, APRN, FNP-C, CEN [facility]  Perioperative Services Nurse Practitioner Phone: [phone] Fax: [phone] 04/14/24 11:13 AM  NOTE: This note has been prepared  using Scientist, clinical (histocompatibility and immunogenetics). Despite my best ability to proofread, there is always the potential that unintentional transcriptional errors may still occur from this process.

## 2024-04-16 MED ORDER — CHLORHEXIDINE GLUCONATE 0.12 % MT SOLN
15.0000 mL | Freq: Once | OROMUCOSAL | Status: AC
  Administered 2024-04-17: 15 mL via OROMUCOSAL

## 2024-04-16 MED ORDER — LACTATED RINGERS IV SOLN: INTRAVENOUS | Status: DC

## 2024-04-16 MED ORDER — ORAL CARE MOUTH RINSE: 15.0000 mL | Freq: Once | OROMUCOSAL | Status: AC

## 2024-04-17 ENCOUNTER — Encounter: Admission: RE | Disposition: A | Payer: Self-pay | Source: Home / Self Care | Attending: Neurosurgery

## 2024-04-17 ENCOUNTER — Encounter: Payer: Self-pay | Admitting: Neurosurgery

## 2024-04-17 ENCOUNTER — Other Ambulatory Visit: Payer: Self-pay

## 2024-04-17 ENCOUNTER — Ambulatory Visit: Payer: Self-pay | Admitting: Urgent Care

## 2024-04-17 ENCOUNTER — Ambulatory Visit
Admission: RE | Admit: 2024-04-17 | Discharge: 2024-04-17 | Disposition: A | Discharge status: Home / Self Care | Attending: Neurosurgery | Admitting: Neurosurgery

## 2024-04-17 DIAGNOSIS — F32A Depression, unspecified: Secondary | ICD-10-CM | POA: Insufficient documentation

## 2024-04-17 DIAGNOSIS — G4733 Obstructive sleep apnea (adult) (pediatric): Secondary | ICD-10-CM | POA: Insufficient documentation

## 2024-04-17 DIAGNOSIS — Z79891 Long term (current) use of opiate analgesic: Secondary | ICD-10-CM | POA: Diagnosis not present

## 2024-04-17 DIAGNOSIS — E039 Hypothyroidism, unspecified: Secondary | ICD-10-CM | POA: Insufficient documentation

## 2024-04-17 DIAGNOSIS — M79604 Pain in right leg: Secondary | ICD-10-CM | POA: Diagnosis present

## 2024-04-17 DIAGNOSIS — E785 Hyperlipidemia, unspecified: Secondary | ICD-10-CM | POA: Insufficient documentation

## 2024-04-17 DIAGNOSIS — Z87891 Personal history of nicotine dependence: Secondary | ICD-10-CM | POA: Diagnosis not present

## 2024-04-17 DIAGNOSIS — G5711 Meralgia paresthetica, right lower limb: Secondary | ICD-10-CM | POA: Diagnosis not present

## 2024-04-17 DIAGNOSIS — M797 Fibromyalgia: Secondary | ICD-10-CM | POA: Diagnosis not present

## 2024-04-17 DIAGNOSIS — Z981 Arthrodesis status: Secondary | ICD-10-CM | POA: Diagnosis not present

## 2024-04-17 DIAGNOSIS — G4700 Insomnia, unspecified: Secondary | ICD-10-CM | POA: Diagnosis not present

## 2024-04-17 DIAGNOSIS — M1990 Unspecified osteoarthritis, unspecified site: Secondary | ICD-10-CM | POA: Diagnosis not present

## 2024-04-17 DIAGNOSIS — I350 Nonrheumatic aortic (valve) stenosis: Secondary | ICD-10-CM | POA: Diagnosis not present

## 2024-04-17 DIAGNOSIS — F419 Anxiety disorder, unspecified: Secondary | ICD-10-CM | POA: Insufficient documentation

## 2024-04-17 DIAGNOSIS — Q2381 Bicuspid aortic valve: Secondary | ICD-10-CM | POA: Diagnosis not present

## 2024-04-17 DIAGNOSIS — R200 Anesthesia of skin: Secondary | ICD-10-CM | POA: Diagnosis present

## 2024-04-17 DIAGNOSIS — R42 Dizziness and giddiness: Secondary | ICD-10-CM | POA: Diagnosis not present

## 2024-04-17 DIAGNOSIS — Z01812 Encounter for preprocedural laboratory examination: Secondary | ICD-10-CM

## 2024-04-17 DIAGNOSIS — Z91199 Patient's noncompliance with other medical treatment and regimen due to unspecified reason: Secondary | ICD-10-CM | POA: Diagnosis not present

## 2024-04-17 HISTORY — DX: Other intervertebral disc degeneration, lumbar region without mention of lumbar back pain or lower extremity pain: M51.369

## 2024-04-17 HISTORY — DX: Unspecified osteoarthritis, unspecified site: M19.90

## 2024-04-17 HISTORY — PX: DECOMPRESSION, NERVE, FEMORAL: SHX7352

## 2024-04-17 HISTORY — DX: Insomnia, unspecified: G47.00

## 2024-04-17 HISTORY — DX: Patient's noncompliance with other medical treatment and regimen due to unspecified reason: Z91.199

## 2024-04-17 HISTORY — DX: Other cervical disc degeneration, unspecified cervical region: M50.30

## 2024-04-17 HISTORY — DX: Meralgia paresthetica, right lower limb: G57.11

## 2024-04-17 HISTORY — DX: Opioid use, unspecified, uncomplicated: F11.90

## 2024-04-17 SURGERY — DECOMPRESSION, NERVE, FEMORAL
Anesthesia: General | Site: Leg Upper | Laterality: Right

## 2024-04-17 MED ORDER — OXYCODONE HCL 5 MG PO TABS: 5.0000 mg | ORAL_TABLET | ORAL | 0 refills | Status: DC | PRN

## 2024-04-17 MED ORDER — CEFAZOLIN SODIUM-DEXTROSE 2-4 GM/100ML-% IV SOLN
INTRAVENOUS | Status: AC
  Filled 2024-04-17: qty 100

## 2024-04-17 MED ORDER — LIDOCAINE HCL (PF) 1 % IJ SOLN
INTRAMUSCULAR | Status: AC
Start: 2024-04-17 — End: 2024-04-17
  Filled 2024-04-17: qty 30

## 2024-04-17 MED ORDER — DOCUSATE SODIUM 100 MG PO CAPS: 100.0000 mg | ORAL_CAPSULE | Freq: Two times a day (BID) | ORAL | 0 refills | Status: DC

## 2024-04-17 MED ORDER — METHYLPREDNISOLONE ACETATE 40 MG/ML IJ SUSP
INTRAMUSCULAR | Status: DC | PRN
  Administered 2024-04-17: 40 mg

## 2024-04-17 MED ORDER — BUPIVACAINE-EPINEPHRINE 0.5% -1:200000 IJ SOLN
INTRAMUSCULAR | Status: DC | PRN
  Administered 2024-04-17: 7 mL

## 2024-04-17 MED ORDER — CEFAZOLIN SODIUM-DEXTROSE 2-3 GM-%(50ML) IV SOLR
INTRAVENOUS | Status: DC | PRN
  Administered 2024-04-17: 2 g via INTRAVENOUS

## 2024-04-17 MED ORDER — PHENYLEPHRINE 80 MCG/ML (10ML) SYRINGE FOR IV PUSH (FOR BLOOD PRESSURE SUPPORT)
PREFILLED_SYRINGE | INTRAVENOUS | Status: DC | PRN
  Administered 2024-04-17: 80 ug via INTRAVENOUS

## 2024-04-17 MED ORDER — FENTANYL CITRATE (PF) 100 MCG/2ML IJ SOLN: 25.0000 ug | INTRAMUSCULAR | Status: DC | PRN

## 2024-04-17 MED ORDER — LIDOCAINE HCL URETHRAL/MUCOSAL 2 % EX GEL
CUTANEOUS | Status: DC | PRN
  Administered 2024-04-17: 1 via TOPICAL

## 2024-04-17 MED ORDER — ONDANSETRON HCL 4 MG/2ML IJ SOLN: 4.0000 mg | Freq: Once | INTRAMUSCULAR | Status: DC | PRN

## 2024-04-17 MED ORDER — CHLORHEXIDINE GLUCONATE 0.12 % MT SOLN
OROMUCOSAL | Status: AC
  Filled 2024-04-17: qty 15

## 2024-04-17 MED ORDER — METHYLPREDNISOLONE ACETATE 40 MG/ML IJ SUSP
INTRAMUSCULAR | Status: AC
  Filled 2024-04-17: qty 1

## 2024-04-17 MED ORDER — PROPOFOL 10 MG/ML IV BOLUS
INTRAVENOUS | Status: DC | PRN
  Administered 2024-04-17: 125 ug/kg/min via INTRAVENOUS

## 2024-04-17 MED ORDER — ACETAMINOPHEN 500 MG PO TABS: 1000.0000 mg | ORAL_TABLET | Freq: Four times a day (QID) | ORAL | 0 refills | Status: AC | PRN

## 2024-04-17 MED ORDER — OXYCODONE HCL 5 MG PO TABS: 5.0000 mg | ORAL_TABLET | Freq: Once | ORAL | Status: DC | PRN

## 2024-04-17 MED ORDER — OXYCODONE HCL 5 MG/5ML PO SOLN: 5.0000 mg | Freq: Once | ORAL | Status: DC | PRN

## 2024-04-17 MED ORDER — LACTATED RINGERS IV SOLN: INTRAVENOUS | Status: DC

## 2024-04-17 MED ORDER — 0.9 % SODIUM CHLORIDE (POUR BTL) OPTIME
TOPICAL | Status: DC | PRN
  Administered 2024-04-17: 500 mL

## 2024-04-17 MED ORDER — ACETAMINOPHEN 10 MG/ML IV SOLN: 1000.0000 mg | Freq: Once | INTRAVENOUS | Status: DC | PRN

## 2024-04-17 MED ORDER — BUPIVACAINE-EPINEPHRINE (PF) 0.5% -1:200000 IJ SOLN
INTRAMUSCULAR | Status: AC
  Filled 2024-04-17: qty 30

## 2024-04-17 MED ORDER — SENNA 8.6 MG PO TABS: 1.0000 | ORAL_TABLET | Freq: Every day | ORAL | 0 refills | Status: DC

## 2024-04-17 SURGICAL SUPPLY — 35 items
BNDG GAUZE DERMACEA FLUFF 4 (GAUZE/BANDAGES/DRESSINGS) ×1 IMPLANT
BRUSH SCRUB EZ 4% CHG (MISCELLANEOUS) ×1 IMPLANT
CHLORAPREP W/TINT 26 (MISCELLANEOUS) ×1 IMPLANT
CORD BIP STRL DISP 12FT (MISCELLANEOUS) ×1 IMPLANT
COVER PROBE FLX POLY STRL (MISCELLANEOUS) IMPLANT
DERMABOND ADVANCED .7 DNX12 (GAUZE/BANDAGES/DRESSINGS) ×1 IMPLANT
DRAPE INCISE IOBAN 66X45 STRL (DRAPES) IMPLANT
DRAPE LAPAROTOMY 77X122 PED (DRAPES) ×1 IMPLANT
DRAPE SURG 17X11 SM STRL (DRAPES) ×2 IMPLANT
DRSG OPSITE POSTOP 3X4 (GAUZE/BANDAGES/DRESSINGS) ×1 IMPLANT
FORCEPS JEWEL BIP 4-3/4 STR (INSTRUMENTS) ×1 IMPLANT
GAUZE SPONGE 4X4 12PLY STRL (GAUZE/BANDAGES/DRESSINGS) ×1 IMPLANT
GAUZE XEROFORM 1X8 LF (GAUZE/BANDAGES/DRESSINGS) ×1 IMPLANT
GLOVE BIOGEL PI IND STRL 8 (GLOVE) ×2 IMPLANT
GLOVE SURG SYN 7.5 PF PI (GLOVE) ×1 IMPLANT
GOWN STRL REUS W/ TWL LRG LVL3 (GOWN DISPOSABLE) ×1 IMPLANT
GOWN STRL REUS W/ TWL XL LVL3 (GOWN DISPOSABLE) ×1 IMPLANT
KIT TURNOVER KIT A (KITS) ×1 IMPLANT
MANIFOLD NEPTUNE II (INSTRUMENTS) ×1 IMPLANT
NDL HYPO 25X1 1.5 SAFETY (NEEDLE) ×1 IMPLANT
NEEDLE HYPO 25X1 1.5 SAFETY (NEEDLE) ×1 IMPLANT
NS IRRIG 500ML POUR BTL (IV SOLUTION) ×1 IMPLANT
PACK BASIN MINOR ARMC (MISCELLANEOUS) ×1 IMPLANT
PROBE NEUROSIGN BIPOL (MISCELLANEOUS) IMPLANT
SPONGE KITTNER 5P (MISCELLANEOUS) ×1 IMPLANT
SURGIFLO W/THROMBIN 8M KIT (HEMOSTASIS) IMPLANT
SUT ETHILON 6 0 9-3 1X18 BLK (SUTURE) ×1 IMPLANT
SUT STRATA 3-0 15 RB-1.5 (SUTURE) IMPLANT
SUT VIC AB 2-0 SH 27XBRD (SUTURE) ×1 IMPLANT
SUT VIC AB 3-0 SH 27X BRD (SUTURE) ×1 IMPLANT
SUT VICRYL 3-0 CR8 SH (SUTURE) ×1 IMPLANT
SYR 3ML LL SCALE MARK (SYRINGE) ×1 IMPLANT
TAPE CLOTH 3X10 WHT NS LF (GAUZE/BANDAGES/DRESSINGS) ×1 IMPLANT
TRAP FLUID SMOKE EVACUATOR (MISCELLANEOUS) ×1 IMPLANT
WATER STERILE IRR 500ML POUR (IV SOLUTION) ×1 IMPLANT

## 2024-04-17 NOTE — H&P (View-Only) (Signed)
 Referring Physician:  Claudene Penne ORN, MD [address] [place],  [place] [address]  Primary Physician:  Vernon Velna SAUNDERS, MD  History of Present Illness: 04/17/2024 [name] is here today with a chief complaint of right-sided meralgia paresthetica.  She does have a complicated past medical history including a history of multiple spinal fusions.  She does state that her initial symptoms started in 2017 when she was sitting on the ground for a prolonged period and she stood up she had severe pain in her right sided groin and then progressed to having numbness tingling and pain down her right anterior thigh. She has had difficulty with managing the symptoms over the past many years.  She has had multiple spinal fusions which did help with some back pain but not with the anterior lateral pain.   Conservative measures:  Injections: Yes  02/28/2024 Right Lateral Femoral Cutaneous Perineural Injection (only helped for about 48 hours) 09/04/2023 Right lateral femoral cutaneous nerve perineural injection(almost 100% relief for about 1 week) Gabapentin : Yes, Lyrica : Yes, Cymbalta : Yes Past Surgery: Yes, multiple lumbar surgeries, SCS  and Neck Surgery  Is this a Second Opinion: No  Review of Systems:  A 10 point review of systems is negative, except for the pertinent positives and negatives detailed in the HPI.  Past Medical History: Past Medical History:  Diagnosis Date   ADHD    ADD   Allergic to pets    Allergies    Anemia    Anxiety    Aortic stenosis due to bicuspid aortic valve    bicuspid AV with mild AS, ascending aorta 3.40 cm 09/21/21 echo   Arthritis    hands, hip, shoulders back,   Childhood asthma    Chronic, continuous use of opioids    a.) has naloxone Rx available   Complication of anesthesia    a.) delayed and prolonged emergence; it lingers for a long time   Concussion    DDD (degenerative disc disease), cervical    a.) s/p ACDF C5-C7    DDD (degenerative disc disease), lumbar    a.) s/p L3-L5 fusion; b.) s/p revision L2-S1 laminectomy, PSF/TLIF, and removal of SCS 01/09/2024   Depression    Family history of adverse reaction to anesthesia    mom had skin reaction to anesthesia but cant remember what it was or exactly what happened   Fatigue    Fibromyalgia    GERD (gastroesophageal reflux disease)    Heart murmur    Hyperlipidemia    Hypothyroidism    Insomnia    a.) uses trazodone  as needed   Meralgia paresthetica, right    Migraines    Numbness and tingling    Osteoarthritis    Personal history of noncompliance with medical treatment and regimen    a,) refuses recommended testing/procedures   Presence of pessary    Ringing in ears    Sleep apnea    a.) no nocturnal PAP therapy; utilizes nocturnal mouth taping   Snoring     Past Surgical History: Past Surgical History:  Procedure Laterality Date   ABDOMINAL EXPOSURE N/A 04/12/2020   Procedure: ABDOMINAL EXPOSURE;  Surgeon: Gretta Lonni PARAS, MD;  Location: [facility] OR;  Service: Vascular;  Laterality: N/A;   ABDOMINOPLASTY     ANTERIOR CERVICAL DECOMP/DISCECTOMY FUSION N/A 06/28/2016   Procedure: ACDF C5-7 ANTERIOR CERVICAL DECOMPRESSION/DISCECTOMY FUSION 2 LEVELS;  Surgeon: Donaciano Sprang, MD;  Location: [facility] OR;  Service: Orthopedics;  Laterality: N/A;  Requests 3 hrs   ANTERIOR LUMBAR FUSION N/A 04/12/2020   Procedure: Anterior Lumbar Interbody Fusion - Lumbar four-Lumbar five , posterior instrumented fusion Lumbar four-five lumbar three -four;  Surgeon: Joshua Alm RAMAN, MD;  Location: [facility] OR;  Service: Neurosurgery;  Laterality: N/A;   BREAST BIOPSY Right 09/19/2021   CARPAL TUNNEL RELEASE Bilateral 1996   CATARACT EXTRACTION Bilateral    03/08/22 and 03/15/2022   COLONOSCOPY     DENTAL SURGERY     HERNIA REPAIR     times 2   LAMINECTOMY WITH POSTERIOR LATERAL ARTHRODESIS LEVEL 1 N/A 04/12/2020   Procedure: Posterior Instrumented Fusion Lumbar three to  lumbar five;  Surgeon: Joshua Alm RAMAN, MD;  Location: [facility] OR;  Service: Neurosurgery;  Laterality: N/A;   LUMBAR LAMINECTOMY/DECOMPRESSION MICRODISCECTOMY  04/21/2015   Procedure: DECOMPRESSION LUMBAR THREE-LUMBAR FOUR;  Surgeon: Donaciano Sprang, MD;  Location: [facility] OR;  Service: Orthopedics;;   LUMBAR LAMINECTOMY/DECOMPRESSION MICRODISCECTOMY N/A 06/20/2017   Procedure: L4-5 decompression, L5-S1 left laminotomy/foraminotomy;  Surgeon: Sprang Donaciano, MD;  Location: [facility] OR;  Service: Orthopedics;  Laterality: N/A;  3 hrs   PARATHYROIDECTOMY  2012   RADIOACTIVE SEED GUIDED EXCISIONAL BREAST BIOPSY Right 10/28/2021   Procedure: RADIOACTIVE SEED GUIDED EXCISIONAL RIGHT BREAST BIOPSY;  Surgeon: Aron Shoulders, MD;  Location: [facility];  Service: General;  Laterality: Right;   SACROILIAC JOINT FUSION Right 07/24/2018   Procedure: SACROILIAC JOINT FUSION;  Surgeon: Sprang Donaciano, MD;  Location: [facility] OR;  Service: Orthopedics;  Laterality: Right;  90 mins   SACROILIAC JOINT FUSION Left 01/12/2022   Procedure: SPINAL CORD STIMULATOR PLACEMENT, LEFT SACROILIAC FUSION;  Surgeon: Sprang Donaciano, MD;  Location: [facility] OR;  Service: Orthopedics;  Laterality: Left;   SHOULDER ARTHROSCOPY W/ ACROMIAL REPAIR Right    SPINAL CORD STIMULATOR INSERTION Left 01/12/2022   Procedure: LUMBAR SPINAL CORD STIMULATOR INSERTION;  Surgeon: Sprang Donaciano, MD;  Location: [facility] OR;  Service: Orthopedics;  Laterality: Left;   TONSILLECTOMY     TOTAL HIP ARTHROPLASTY Right 11/10/2020   Procedure: TOTAL HIP ARTHROPLASTY ANTERIOR APPROACH;  Surgeon: Melodi Lerner, MD;  Location: [facility] ORS;  Service: Orthopedics;  Laterality: Right;     Allergies: Allergies as of 03/13/2024 - Review Complete 03/10/2024  Allergen Reaction Noted   Fish allergy Anaphylaxis 06/23/2014   Peanut butter flavoring agent (non-screening) Anaphylaxis 01/05/2022   Hazelnut (filbert) Other (See Comments) 03/06/2024   Other Other (See Comments)  04/20/2022    Medications:  Current Facility-Administered Medications:    chlorhexidine  (PERIDEX ) 0.12 % solution 15 mL, 15 mL, Mouth/Throat, Once **OR** Oral care mouth rinse, 15 mL, Mouth Rinse, Once, Dario Barter, MD   lactated ringers  infusion, , Intravenous, Continuous, Dario Barter, MD  Social History: Social History   Tobacco Use   Smoking status: Former    Current packs/day: 0.00    Average packs/day: 0.3 packs/day for 4.0 years (1.0 ttl pk-yrs)    Types: Cigarettes    Start date: 26    Quit date: 1976    Years since quitting: 49.8   Smokeless tobacco: Never   Tobacco comments:    social smoker stop at age 36  Vaping Use   Vaping status: Never Used  Substance Use Topics   Alcohol  use: Yes    Alcohol /week: 1.0 - 2.0 standard drink of alcohol     Types: 1 - 2 Glasses of wine per week    Comment: socially   Drug use: No    Family Medical History: Family History  Problem  Relation Age of Onset   Breast cancer Mother 30 - 18   COPD Mother    Cancer Mother    Hypertension Mother    Hyperlipidemia Mother    Heart disease Mother    Thyroid  disease Mother    Thyroid  disease Sister    Thyroid  disease Daughter    Pseudotumor cerebri Neg Hx    BRCA 1/2 Neg Hx     Physical Examination: Vitals:   04/17/24 1314  BP: (!) 128/50  Pulse: 75  Resp: 18  Temp: (!) 97.5 F (36.4 C)  SpO2: 99%    General: Patient is in no apparent distress. Attention to examination is appropriate.  Neck:   Supple.  Full range of motion.  Respiratory: Patient is breathing without any difficulty.   NEUROLOGICAL:     Awake, alert, oriented to person, place, and time.  Speech is clear and fluent.   Cranial Nerves: Pupils equal round and reactive to light.  Facial tone is symmetric. Shoulder shrug is symmetric. Tongue protrusion is midline.  There is no pronator drift.  Motor Exam: No major motor deficit is noted in the bilateral lower extremities.  No major areas of  atrophy  She has some subjective decrease in sensation in the right lateral femoral cutaneous nerve distribution.  She does have an anterior hip incision, however this was done multiple years prior to her development pain  Medical Decision Making  Imaging: I reviewed her lumbar MRI, shows multilevel lumbar decompression and fusion.  Electrodiagnostics: I reviewed her most recent neurology referral with Dr. Darleen who diagnosed her with chronic L5-S1 radiculopathy changes.  I reviewed her outside injection records which demonstrate right sided lateral femoral cutaneous nerve injections x 2, 100% reduction in pain temporarily with the first injection and 50% reduction with the second injection.  I have personally reviewed the images and electrodiagnostics and agree with the above interpretation.  Assessment and Plan: Ms. Mcmenamin is a pleasant 73 y.o. female with symptoms consistent with right-sided meralgia paresthetica.  She has been dealing with the symptoms since 2017.  She has had multiple spinal fusions which have helped with her back pain but have not given her significant relief in the right lower extremity in the lateral femoral cutaneous distribution.  She has had 2 injections both of which gave her significant relief, the first was better than the second but both were right sided lateral femoral cutaneous nerve injections.  She has not had a release.  She has had previous anterior hip surgery.  At this point she has had chronic pain in the lateral femoral cutaneous nerve distribution, she has symptoms consistent with a meralgia paresthetica.  Her symptoms been present for approximately 8 years.  She has responded to injections with a good result but they were short-lived.  We discussed LF CN decompression and transposition.  I did discuss with her that she is at higher risk for lack of improvement given the prolonged period of time that she has had the symptoms.  We discussed the role of  neurectomy which I do not like to do upfront and rather save for any episodes of recurrence.  We also discussed the possibility of needing peripheral nerve stimulation in the future, at this point she would like to go forward with the LF CN decompression on the right side, we discussed risk benefits of procedure.  She would like to go forward with the surgery.   Thank you for involving me in the care of this patient.  Penne MICAEL Sharps MD/MSCR Neurosurgery - Peripheral Nerve Surgery

## 2024-04-17 NOTE — Transfer of Care (Signed)
 Immediate Anesthesia Transfer of Care Note  Patient: Courtney [name]  Procedure(s) Performed: DECOMPRESSION, NERVE, FEMORAL (Right: Leg Upper)  Patient Location: PACU  Anesthesia Type:General  Level of Consciousness: awake, alert , and oriented  Airway & Oxygen Therapy: Patient Spontanous Breathing  Post-op Assessment: Report given to RN and Post -op Vital signs reviewed and stable  Post vital signs: stable  Last Vitals:  Vitals Value Taken Time  BP 129/55 04/17/24 15:20  Temp    Pulse 67 04/17/24 15:22  Resp 11 04/17/24 15:22  SpO2 100 % 04/17/24 15:22  Vitals shown include unfiled device data.  Last Pain:  Vitals:   04/17/24 1314  PainSc: 0-No pain         Complications: No notable events documented.

## 2024-04-17 NOTE — Anesthesia Preprocedure Evaluation (Addendum)
 Anesthesia Evaluation  Patient identified by MRN, date of birth, ID band Patient awake    Reviewed: Allergy & Precautions, NPO status , Patient's Chart, lab work & pertinent test results  History of Anesthesia Complications Negative for: history of anesthetic complications  Airway Mallampati: III   Neck ROM: Full    Dental  (+) Missing   Pulmonary asthma , sleep apnea and Continuous Positive Airway Pressure Ventilation , former smoker (quit 1976)   Pulmonary exam normal breath sounds clear to auscultation       Cardiovascular Normal cardiovascular exam+ Valvular Problems/Murmurs (mild AS, mild MR)  Rhythm:Regular Rate:Normal  ECG 04/11/23:  Normal sinus rhythm Normal axis TW inversions 1, aVL Similar to multiple old tracings  Echo 09/21/21:  1. Left ventricular ejection fraction, by estimation, is 60 to 65%. The left ventricle has normal function. The left ventricle has no regional wall motion abnormalities. Left ventricular diastolic function could not be evaluated.  2. Right ventricular systolic function is normal. The right ventricular size is normal. There is normal pulmonary artery systolic pressure. The estimated right ventricular systolic pressure is 21.5 mmHg.  3. The mitral valve is normal in structure. Mild mitral valve regurgitation.  4. The aortic valve is bicuspid. There is moderate calcification of the aortic valve. There is moderate thickening of the aortic valve. Aortic valve regurgitation is not visualized. Mild aortic valve stenosis. Aortic valve mean gradient measures 11.0 mmHg. Aortic valve Vmax measures 2.31 m/s.  5. No coarctation is seen.    Neuro/Psych  Headaches PSYCHIATRIC DISORDERS (ADHD) Anxiety Depression       GI/Hepatic ,GERD  ,,  Endo/Other  Hypothyroidism    Renal/GU      Musculoskeletal  (+) Arthritis ,  Fibromyalgia -  Abdominal   Peds  Hematology  (+) Blood dyscrasia, anemia    Anesthesia Other Findings Cardiology note 04/02/24:  Courtney [name]'s perioperative risk of a major cardiac event is 0.4% according to the Revised Cardiac Risk Index (RCRI).  Therefore, she is at low risk for perioperative complications.   Her functional capacity is good at 4.31 METs according to the Duke Activity Status Index (DASI). Recommendations: According to ACC/AHA guidelines, no further cardiovascular testing needed.  The patient may proceed to surgery at acceptable risk.   Bicuspid aortic valve Mild aortic stenosis Mild AS w mean gradient 11 mmHg by TTE in 2023. CT in 2021 with normal caliber ascending thoracic aorta. Asymptomatic with no chest pain, shortness of breath, or syncope. Plan was to see her in 2026. But since she is here today, will arrange her routine follow up echocardiogram now and see her back in 2 years. Echocardiogram is not needed before her surgery.  - Order echocardiogram   - Schedule follow-up in two years unless symptoms develop (me or Dr. Wonda) Pure hypercholesterolemia Managed by PCP.     Dispo:  Return in about 2 years (around 04/02/2026) for Routine Follow Up, w/ Courtney Ferrier, PA-C.   Reproductive/Obstetrics                              Anesthesia Physical Anesthesia Plan  ASA: 3  Anesthesia Plan: General   Post-op Pain Management:    Induction: Intravenous  PONV Risk Score and Plan: 3 and Propofol  infusion, TIVA and Treatment may vary due to age or medical condition  Airway Management Planned: Natural Airway  Additional Equipment:   Intra-op Plan:   Post-operative Plan:   Informed  Consent: I have reviewed the patients History and Physical, chart, labs and discussed the procedure including the risks, benefits and alternatives for the proposed anesthesia with the patient or authorized representative who has indicated his/her understanding and acceptance.       Plan Discussed with: CRNA  Anesthesia Plan  Comments: (LMA/GETA backup discussed.  Patient consented for risks of anesthesia including but not limited to:  - adverse reactions to medications - damage to eyes, teeth, lips or other oral mucosa - nerve damage due to positioning  - sore throat or hoarseness - damage to heart, brain, nerves, lungs, other parts of body or loss of life  Informed patient about role of CRNA in peri- and intra-operative care.  Patient voiced understanding.)         Anesthesia Quick Evaluation

## 2024-04-17 NOTE — Interval H&P Note (Signed)
 History and Physical Interval Note:  04/17/2024 1:44 PM  Courtney [name]  has presented today for surgery, with the diagnosis of Meralgia Paresthetica, Right.  The various methods of treatment have been discussed with the patient and family. After consideration of risks, benefits and other options for treatment, the patient has consented to  Procedure(s) with comments: DECOMPRESSION, NERVE, FEMORAL (Right) - RIGHT SIDE LATERAL FEMORAL CUTANEOUS NERVE DECOMPRESSION AND TRANSPOSITION as a surgical intervention.  The patient's history has been reviewed, patient examined, no change in status, stable for surgery.  I have reviewed the patient's chart and labs.  Questions were answered to the patient's satisfaction.    Herat and lungs clear   Penne LELON Sharps

## 2024-04-17 NOTE — Discharge Instructions (Signed)
    Where possible, avoid household activities that involve lifting, bending, reaching, pushing, or pulling such as laundry, vacuuming, grocery shopping, and childcare. Try to arrange for help from friends and family for these activities while you heal.  You should not drive until you are no longer taking narcotic pain medication and you feel safe to operate a moving vehicle.     You should rest at home and let your body heal.   3 days post-op you may remove your bandage.  You may shower three days after your surgery.  After showering, lightly dab your incision dry. Do not take a tub bath or go swimming until approved by your doctor at your follow-up appointment.  If you smoke, we strongly recommend that you quit.  Smoking has been proven to interfere with wound healing will dramatically reduce the success rate of your surgery. Please contact [facility] (800-QUIT-NOW) and use the resources at www.[facility].com for assistance in stopping smoking.  Surgical Incision    The steri-strips/glue should begin to peel away within about a week. Diet           You may return to your usual diet. Be sure to stay hydrated.  You have been prescribed narcotic pain medications.  This often will cause constipation along with the anesthesia that you underwent.  Please obtain Colace and senna. This has been sent to your local pharmacy, but at times it is not covered by insurance and you may obtain it over the counter..  You should take a stool softener and laxative for the duration of you taking the narcotic pain medications.  When to Contact Us   Contact us  immediately if you have any: New numbness or weakness Pain that is progressively getting worse, and is not relieved by your pain medication, muscle relaxers, rest, and warm compresses Bleeding, redness, swelling, pain, or drainage from surgical incision Chills or flu-like symptoms Fever greater than 101.0 F (38.3 C) Inability to eat, drink fluids, or  take medications Problems with bowel or bladder functions Difficulty breathing or shortness of breath Warmth, tenderness, or swelling in your calf Contact Information How to contact us :  If you have any questions/concerns before or after surgery, you can reach us  at [phone], or you can send a mychart message. We can be reached by phone or mychart 8am-4pm, Monday-Friday.  *Please note: Calls after 4pm are forwarded to a third party answering service. Mychart messages are not routinely monitored during evenings, weekends, and holidays. Please call our office to contact the answering service for urgent concerns during non-business hours.

## 2024-04-17 NOTE — Progress Notes (Signed)
 Referring Physician:  Claudene [name] ORN, MD [address] [place],  [place] [address]  Primary Physician:  Courtney Velna SAUNDERS, MD  History of Present Illness: 04/17/2024 [name] is here today with a chief complaint of right-sided meralgia paresthetica.  She does have a complicated past medical history including a history of multiple spinal fusions.  She does state that her initial symptoms started in 2017 when she was sitting on the ground for a prolonged period and she stood up she had severe pain in her right sided groin and then progressed to having numbness tingling and pain down her right anterior thigh. She has had difficulty with managing the symptoms over the past many years.  She has had multiple spinal fusions which did help with some back pain but not with the anterior lateral pain.   Conservative measures:  Injections: Yes  02/28/2024 Right Lateral Femoral Cutaneous Perineural Injection (only helped for about 48 hours) 09/04/2023 Right lateral femoral cutaneous nerve perineural injection(almost 100% relief for about 1 week) Gabapentin : Yes, Lyrica : Yes, Cymbalta : Yes Past Surgery: Yes, multiple lumbar surgeries, SCS  and Neck Surgery  Is this a Second Opinion: No  Review of Systems:  A 10 point review of systems is negative, except for the pertinent positives and negatives detailed in the HPI.  Past Medical History: Past Medical History:  Diagnosis Date   ADHD    ADD   Allergic to pets    Allergies    Anemia    Anxiety    Aortic stenosis due to bicuspid aortic valve    bicuspid AV with mild AS, ascending aorta 3.40 cm 09/21/21 echo   Arthritis    hands, hip, [name] back,   Childhood asthma    Chronic, continuous use of opioids    a.) has naloxone Rx available   Complication of anesthesia    a.) delayed and prolonged emergence; it lingers for a long time   Concussion    DDD (degenerative disc disease), cervical    a.) s/p ACDF C5-C7    DDD (degenerative disc disease), lumbar    a.) s/p L3-L5 fusion; b.) s/p revision L2-S1 laminectomy, PSF/TLIF, and removal of SCS 01/09/2024   Depression    Family history of adverse reaction to anesthesia    mom had skin reaction to anesthesia but cant remember what it was or exactly what happened   Fatigue    Fibromyalgia    GERD (gastroesophageal reflux disease)    Heart murmur    Hyperlipidemia    Hypothyroidism    Insomnia    a.) uses trazodone  as needed   Meralgia paresthetica, right    Migraines    Numbness and tingling    Osteoarthritis    Personal history of noncompliance with medical treatment and regimen    a,) refuses recommended testing/procedures   Presence of pessary    Ringing in ears    Sleep apnea    a.) no nocturnal PAP therapy; utilizes nocturnal mouth taping   Snoring     Past Surgical History: Past Surgical History:  Procedure Laterality Date   ABDOMINAL EXPOSURE N/A 04/12/2020   Procedure: ABDOMINAL EXPOSURE;  Surgeon: Courtney Lonni PARAS, MD;  Location: [facility] OR;  Service: Vascular;  Laterality: N/A;   ABDOMINOPLASTY     ANTERIOR CERVICAL DECOMP/DISCECTOMY FUSION N/A 06/28/2016   Procedure: ACDF C5-7 ANTERIOR CERVICAL DECOMPRESSION/DISCECTOMY FUSION 2 LEVELS;  Surgeon: Courtney Sprang, MD;  Location: [facility] OR;  Service: Orthopedics;  Laterality: N/A;  Requests 3 hrs   ANTERIOR LUMBAR FUSION N/A 04/12/2020   Procedure: Anterior Lumbar Interbody Fusion - Lumbar four-Lumbar five , posterior instrumented fusion Lumbar four-five lumbar three -four;  Surgeon: Courtney Alm RAMAN, MD;  Location: [facility] OR;  Service: Neurosurgery;  Laterality: N/A;   BREAST BIOPSY Right 09/19/2021   CARPAL TUNNEL RELEASE Bilateral 1996   CATARACT EXTRACTION Bilateral    03/08/22 and 03/15/2022   COLONOSCOPY     DENTAL SURGERY     HERNIA REPAIR     times 2   LAMINECTOMY WITH POSTERIOR LATERAL ARTHRODESIS LEVEL 1 N/A 04/12/2020   Procedure: Posterior Instrumented Fusion Lumbar three to  lumbar five;  Surgeon: Courtney Alm RAMAN, MD;  Location: [facility] OR;  Service: Neurosurgery;  Laterality: N/A;   LUMBAR LAMINECTOMY/DECOMPRESSION MICRODISCECTOMY  04/21/2015   Procedure: DECOMPRESSION LUMBAR THREE-LUMBAR FOUR;  Surgeon: Courtney Sprang, MD;  Location: [facility] OR;  Service: Orthopedics;;   LUMBAR LAMINECTOMY/DECOMPRESSION MICRODISCECTOMY N/A 06/20/2017   Procedure: L4-5 decompression, L5-S1 left laminotomy/foraminotomy;  Surgeon: [name] Donaciano, MD;  Location: [facility] OR;  Service: Orthopedics;  Laterality: N/A;  3 hrs   PARATHYROIDECTOMY  2012   RADIOACTIVE SEED GUIDED EXCISIONAL BREAST BIOPSY Right 10/28/2021   Procedure: RADIOACTIVE SEED GUIDED EXCISIONAL RIGHT BREAST BIOPSY;  Surgeon: Courtney Shoulders, MD;  Location: [facility];  Service: General;  Laterality: Right;   SACROILIAC JOINT FUSION Right 07/24/2018   Procedure: SACROILIAC JOINT FUSION;  Surgeon: [name] Donaciano, MD;  Location: [facility] OR;  Service: Orthopedics;  Laterality: Right;  90 mins   SACROILIAC JOINT FUSION Left 01/12/2022   Procedure: SPINAL CORD STIMULATOR PLACEMENT, LEFT SACROILIAC FUSION;  Surgeon: [name] Donaciano, MD;  Location: [facility] OR;  Service: Orthopedics;  Laterality: Left;   SHOULDER ARTHROSCOPY W/ ACROMIAL REPAIR Right    SPINAL CORD STIMULATOR INSERTION Left 01/12/2022   Procedure: LUMBAR SPINAL CORD STIMULATOR INSERTION;  Surgeon: [name] Donaciano, MD;  Location: [facility] OR;  Service: Orthopedics;  Laterality: Left;   TONSILLECTOMY     TOTAL HIP ARTHROPLASTY Right 11/10/2020   Procedure: TOTAL HIP ARTHROPLASTY ANTERIOR APPROACH;  Surgeon: Courtney Lerner, MD;  Location: [facility] ORS;  Service: Orthopedics;  Laterality: Right;     Allergies: Allergies as of 03/13/2024 - Review Complete 03/10/2024  Allergen Reaction Noted   Fish allergy Anaphylaxis 06/23/2014   Peanut butter flavoring agent (non-screening) Anaphylaxis 01/05/2022   Hazelnut (filbert) Other (See Comments) 03/06/2024   Other Other (See Comments)  04/20/2022    Medications:  Current Facility-Administered Medications:    chlorhexidine  (PERIDEX ) 0.12 % solution 15 mL, 15 mL, Mouth/Throat, Once **OR** Oral care mouth rinse, 15 mL, Mouth Rinse, Once, Dario Barter, MD   lactated ringers  infusion, , Intravenous, Continuous, Dario Barter, MD  Social History: Social History   Tobacco Use   Smoking status: Former    Current packs/day: 0.00    Average packs/day: 0.3 packs/day for 4.0 years (1.0 ttl pk-yrs)    Types: Cigarettes    Start date: 26    Quit date: 1976    Years since quitting: 49.8   Smokeless tobacco: Never   Tobacco comments:    social smoker stop at age 36  Vaping Use   Vaping status: Never Used  Substance Use Topics   Alcohol  use: Yes    Alcohol /week: 1.0 - 2.0 standard drink of alcohol     Types: 1 - 2 Glasses of wine per week    Comment: socially   Drug use: No    Family Medical History: Family History  Problem  Relation Age of Onset   Breast cancer Mother 30 - 18   COPD Mother    Cancer Mother    Hypertension Mother    Hyperlipidemia Mother    Heart disease Mother    Thyroid  disease Mother    Thyroid  disease Sister    Thyroid  disease Daughter    Pseudotumor cerebri Neg Hx    BRCA 1/2 Neg Hx     Physical Examination: Vitals:   04/17/24 1314  BP: (!) 128/50  Pulse: 75  Resp: 18  Temp: (!) 97.5 F (36.4 C)  SpO2: 99%    General: Patient is in no apparent distress. Attention to examination is appropriate.  Neck:   Supple.  Full range of motion.  Respiratory: Patient is breathing without any difficulty.   NEUROLOGICAL:     Awake, alert, oriented to person, place, and time.  Speech is clear and fluent.   Cranial Nerves: Pupils equal round and reactive to light.  Facial tone is symmetric. Shoulder shrug is symmetric. Tongue protrusion is midline.  There is no pronator drift.  Motor Exam: No major motor deficit is noted in the bilateral lower extremities.  No major areas of  atrophy  She has some subjective decrease in sensation in the right lateral femoral cutaneous nerve distribution.  She does have an anterior hip incision, however this was done multiple years prior to her development pain  Medical Decision Making  Imaging: I reviewed her lumbar MRI, shows multilevel lumbar decompression and fusion.  Electrodiagnostics: I reviewed her most recent neurology referral with Dr. Darleen who diagnosed her with chronic L5-S1 radiculopathy changes.  I reviewed her outside injection records which demonstrate right sided lateral femoral cutaneous nerve injections x 2, 100% reduction in pain temporarily with the first injection and 50% reduction with the second injection.  I have personally reviewed the images and electrodiagnostics and agree with the above interpretation.  Assessment and Plan: Ms. Mcmenamin is a pleasant 73 y.o. female with symptoms consistent with right-sided meralgia paresthetica.  She has been dealing with the symptoms since 2017.  She has had multiple spinal fusions which have helped with her back pain but have not given her significant relief in the right lower extremity in the lateral femoral cutaneous distribution.  She has had 2 injections both of which gave her significant relief, the first was better than the second but both were right sided lateral femoral cutaneous nerve injections.  She has not had a release.  She has had previous anterior hip surgery.  At this point she has had chronic pain in the lateral femoral cutaneous nerve distribution, she has symptoms consistent with a meralgia paresthetica.  Her symptoms been present for approximately 8 years.  She has responded to injections with a good result but they were short-lived.  We discussed LF CN decompression and transposition.  I did discuss with her that she is at higher risk for lack of improvement given the prolonged period of time that she has had the symptoms.  We discussed the role of  neurectomy which I do not like to do upfront and rather save for any episodes of recurrence.  We also discussed the possibility of needing peripheral nerve stimulation in the future, at this point she would like to go forward with the LF CN decompression on the right side, we discussed risk benefits of procedure.  She would like to go forward with the surgery.   Thank you for involving me in the care of this patient.  [name] MICAEL Sharps MD/MSCR Neurosurgery - Peripheral Nerve Surgery

## 2024-04-18 ENCOUNTER — Telehealth: Payer: Self-pay

## 2024-04-18 ENCOUNTER — Encounter: Payer: Self-pay | Admitting: Neurosurgery

## 2024-04-18 NOTE — Telephone Encounter (Signed)
 Pharmacy had sent in a request for clarification on oxycodone  5mg  prescription. Informed them that patient takes 10mg  oxycodone  at baseline and that we sent in a prescription for oxycodone  5mg  for surgical breakthrough pain. [facility] now processing script.

## 2024-04-18 NOTE — Telephone Encounter (Signed)
 Received a fax from [facility] in [place]: pH# [phone]

## 2024-04-18 NOTE — Telephone Encounter (Signed)
 See other phone call. Script now being filled.

## 2024-04-21 ENCOUNTER — Encounter: Payer: Self-pay | Admitting: Neurosurgery

## 2024-04-21 NOTE — Op Note (Signed)
 Indications: Patient was found to have a lateral femoral cutaneous neuropathy on clinical exam and with electrodiagnostic and ultrasound testing.  Given their persistent symptoms in the face of conservative management, they were taken to the OR for peroneal nerve decompression.  Findings: Enlargement of the lateral femoral cutaneous nerve, compression at the ASIS/Inguinal Ligament  Preoperative Diagnosis:  Problem List Items Addressed This Visit     Right leg pain   Right leg numbness   * (Principal) Meralgia paraesthetica, right   Other Visit Diagnoses       Meralgia paresthetica, right    -  Primary     Encounter for preprocedural laboratory examination            Postoperative Diagnosis: same     EBL: Minimal IVF: See anesthesia report Drains: none Disposition:Stable to PACU Complications: none   No foley catheter was placed.     Preoperative Note: The patient was seen again in the preoperative area.  They continue to have symptoms of peroneal neuropathy that were causing a significant impact on their lives.  They did not have any improvement with conservative management therefore we planned for a peroneal nerve decompression.  Risk of surgery is discussed and include: Infection, bleeding, wound healing issues, nerve injury, pain, failure to relieve the symptoms, need for further surgery.   Procedure:  1) right sided lateral femoral cutaneous nerve decompression and transposition   Procedure: After obtaining informed consent, the patient taken to the operating room, placed in supine position, monitored anesthesia care was induced.  They were given preoperative antibiotics.  Prepped and draped in the usual fashion.  Comprehensive timeout was performed verifying the patient's name, MRN, planned procedure.   An intraoperative ultrasound was utilized to identify the course of the lateral femoral cutaneous nerve.  This was identified just medial to the ASIS underneath the inguinal  ligament.  Its course was mapped superficially and helped us  to plan our incision in a transverse manner just distal to the ASIS.  The skin was infiltrated with local anesthetic.  It was opened sharply.  The skin and subcutaneous tissues were opened down to the level of the fascia.  At this point we slowed our exposure and were able to palpate the nerve as it entered medially to the ASIS just below the inguinal ligament.  Taking care not to injure the nerve we incised the fascia overlying the nerve and were able to expose it inside the LF CN tunnel.  We then followed the nerve distally to ensure its distal decompression.  We relieved any compressive fascial bands.  We then turned our attention to the more proximal decompression and transposition.  As we followed the nerve proximally we were able to file it into the LFCN tunnel circumferentially it seem to be quite compressed and the nerve was enlarged distal to this.  Starting with the inguinal ligament we released this from its compression point, we then went to the medial side of the tunnel and open the fascial band at this location with care taken not to violate any nerve structures medially.  Once the superior and medial borders were dissected we then turned our attention to the inferior border.  There appeared to be a tough fibrous/fascial ridge deep to the nerve right at its entry site to the LF CN canal.  It was decompressed at this location and divided making it possible for us  to transpose the nerve medially.  At this point there is a 360 degree decompression of  the lateral femoral cutaneous nerve.  The wound was copiously irrigated.  Meticulous hemostasis was obtained.  Depo-Medrol  was placed on the nerve to help with inflammation.  The wound was then closed in multiple layers.  With skin glue being the most superficial layer.  There were no immediate complications  Patty counts were correct at the end of the procedure  I performed this  procedure with the assistance of [employer], [place].  They were necessary in the procedure to help with exposure, visualization, protection of critical neural elements, retraction, and closure.  I performed the critical portions of the procedures myself.   [place] MICAEL Sharps, MD/MSCR

## 2024-04-30 ENCOUNTER — Ambulatory Visit: Admitting: Physician Assistant

## 2024-04-30 DIAGNOSIS — G5711 Meralgia paresthetica, right lower limb: Secondary | ICD-10-CM

## 2024-04-30 DIAGNOSIS — Z09 Encounter for follow-up examination after completed treatment for conditions other than malignant neoplasm: Secondary | ICD-10-CM

## 2024-04-30 NOTE — Progress Notes (Signed)
 04/17/24: Right lateral femoral cutaneous nerve decompression.  Patient sent in outside image as she preferred to have a virtual visit today that is also attached to this note.  She does have extensive bruising that can be seen.  She also has some erythema surrounding her incision.  The more medial aspect of her incision appears to be slightly open.  The patient states that she has not had any drainage from this area and has been putting a paper tape over it.  I have instructed her to not put tape over it without gauze over the actual incision site first.  I told her I would like her to come in to clinic so I can put Steri-Strips or Medihoney on the incision and to keep a better eye on it to make sure it heals properly.  Patient like to hold off at this time and use supplies that she has at home.  She states that she will send in a picture on Monday to keep me updated.  She does have some areas of pain near her pubic bone.  However she does feel overall that her back, balance, and right leg strength has improved.  She is back to her baseline dose of oxycodone .  Again, concerns over incision were discussed with the patient.  She was instructed to let me know if she starts to have any drainage from the area in order to come in for wound reinforcement.      This visit was performed via telephone.  Patient location: home Provider location: office  I spent a total of 10 minutes non-face-to-face activities for this visit on the date of this encounter including review of current clinical condition and response to treatment.  The patient is aware of and accepts the limits of this telehealth visit.

## 2024-05-03 NOTE — Anesthesia Postprocedure Evaluation (Signed)
 Anesthesia Post Note  Patient: Courtney [name]  Procedure(s) Performed: DECOMPRESSION, NERVE, FEMORAL (Right: Leg Upper)  Patient location during evaluation: PACU Anesthesia Type: General Level of consciousness: awake and alert Pain management: pain level controlled Vital Signs Assessment: post-procedure vital signs reviewed and stable Respiratory status: spontaneous breathing, nonlabored ventilation, respiratory function stable and patient connected to nasal cannula oxygen Cardiovascular status: blood pressure returned to baseline and stable Postop Assessment: no apparent nausea or vomiting Anesthetic complications: no   There were no known notable events for this encounter.   Last Vitals:  Vitals:   04/17/24 1521 04/17/24 1610  BP: (!) 129/55 (!) 146/63  Pulse: (!) 59 (!) 55  Resp: 10   Temp: (!) 36.3 C (!) 36.3 C  SpO2: 100% 100%    Last Pain:  Vitals:   04/17/24 1610  TempSrc: Temporal  PainSc: 2                  Prentice Murphy

## 2024-05-23 ENCOUNTER — Ambulatory Visit (INDEPENDENT_AMBULATORY_CARE_PROVIDER_SITE_OTHER): Admitting: Ophthalmology

## 2024-05-23 ENCOUNTER — Encounter (INDEPENDENT_AMBULATORY_CARE_PROVIDER_SITE_OTHER): Payer: Self-pay | Admitting: Ophthalmology

## 2024-05-23 DIAGNOSIS — H04123 Dry eye syndrome of bilateral lacrimal glands: Secondary | ICD-10-CM | POA: Diagnosis not present

## 2024-05-23 DIAGNOSIS — H4710 Unspecified papilledema: Secondary | ICD-10-CM

## 2024-05-23 DIAGNOSIS — Z961 Presence of intraocular lens: Secondary | ICD-10-CM

## 2024-05-23 DIAGNOSIS — H26492 Other secondary cataract, left eye: Secondary | ICD-10-CM | POA: Diagnosis not present

## 2024-05-23 DIAGNOSIS — Q141 Congenital malformation of retina: Secondary | ICD-10-CM | POA: Diagnosis not present

## 2024-05-23 DIAGNOSIS — H33322 Round hole, left eye: Secondary | ICD-10-CM

## 2024-05-23 DIAGNOSIS — H35412 Lattice degeneration of retina, left eye: Secondary | ICD-10-CM

## 2024-05-23 MED ORDER — PREDNISOLONE ACETATE 1 % OP SUSP: 1.0000 [drp] | Freq: Four times a day (QID) | OPHTHALMIC | 0 refills | Status: AC

## 2024-05-23 NOTE — Progress Notes (Shared)
 [facility] - Clinic Note  05/23/2024     CHIEF COMPLAINT Patient presents for Retina Follow Up   HISTORY OF PRESENT ILLNESS: Courtney [name] is a 73 y.o. female who presents to the clinic today for:   HPI     Retina Follow Up   Patient presents with  Other.  In left eye.  This started 3 months ago.  Duration of 3 months.  Since onset it is stable.  I, the attending physician,  performed the HPI with the patient and updated documentation appropriately.        Comments   Yag eval, referral from [facility]. Pt presents for decreased vision in OS. Pt states OS has always been weaker eye but now she can see little debris blocking her vision. Pt denies FOL/floaters/pain.       Last edited by Valdemar Rogue, MD on 05/23/2024  3:50 PM.     Pt states she's noticing vision OS and development of film. She's aware it's not as sharp as it has been, sees glare.   Referring physician: Vernon Velna SAUNDERS, MD [address] [place],  [place] [address]  HISTORICAL INFORMATION:   Selected notes from the MEDICAL RECORD NUMBER Referred by Dr. Waylan for concern of operculated tear OS LEE:  Ocular Hx- PMH-    CURRENT MEDICATIONS: Current Outpatient Medications (Ophthalmic Drugs)  Medication Sig   prednisoLONE  acetate (PRED FORTE ) 1 % ophthalmic suspension Place 1 drop into the left eye 4 (four) times daily for 7 days.   Carboxymethylcellul-Glycerin  (REFRESH OPTIVE OP) Place 1 drop into both eyes daily as needed (dry eyes).   MIEBO 1.338 GM/ML SOLN Apply 1 drop to eye.   Polyethyl Glyc-Propyl Glyc PF (SYSTANE HYDRATION PF) 0.4-0.3 % SOLN    No current facility-administered medications for this visit. (Ophthalmic Drugs)   Current Outpatient Medications (Other)  Medication Sig   acetaminophen  (TYLENOL ) 500 MG tablet Take 2 tablets (1,000 mg total) by mouth every 6 (six) hours as needed.   Ascorbic Acid  (VITAMIN C) 1000 MG tablet Take 1,000 mg by mouth in the  morning and at bedtime.   atorvastatin  (LIPITOR) 10 MG tablet Take 10 mg by mouth daily.   Biotin 5000 MCG CAPS Take 1 capsule by mouth daily.   CALCIUM  CITRATE PO Take 1,000 mg by mouth daily.   cholecalciferol  (VITAMIN D3) 25 MCG (1000 UNIT) tablet Take 2,000 Units by mouth daily.   clobetasol cream (TEMOVATE) 0.05 % Apply 1 Application topically 2 (two) times a week.   Cyanocobalamin (VITAMIN B 12 PO) Take 1,000 mcg by mouth daily.   diclofenac Sodium (VOLTAREN) 1 % GEL Apply topically as needed.   DULoxetine  (CYMBALTA ) 30 MG capsule Take 30 mg by mouth daily after breakfast.    EPINEPHrine  (EPIPEN  JR) 0.15 MG/0.3ML injection Inject 0.15 mg into the muscle as needed for anaphylaxis.   estradiol (ESTRACE) 0.1 MG/GM vaginal cream Place 1 Applicatorful vaginally at bedtime.   FERROUS BISGLYCINATE  CHELATE PO Take 25 mg by mouth daily.    ferrous sulfate 325 (65 FE) MG tablet Take 325 mg by mouth 2 (two) times daily.   folic acid (FOLVITE) 1 MG tablet Take 800 mcg by mouth daily.   GEMTESA 75 MG TABS Take 1 tablet by mouth daily.   levothyroxine  (SYNTHROID , LEVOTHROID) 25 MCG tablet Take 1 1/2 tablets daily (Patient taking differently: Take 37.5 mcg by mouth daily before breakfast. Take 1 1/2 tablets daily)   lidocaine  4 %  Apply 1 patch topically.   meloxicam (MOBIC) 15 MG tablet Take 15 mg by mouth daily.   methocarbamol  (ROBAXIN ) 500 MG tablet Take 500 mg by mouth 2 (two) times daily.   Multiple Vitamin (MULTIVITAMIN) tablet Take 1 tablet by mouth daily.   naloxone (NARCAN) nasal spray 4 mg/0.1 mL 1 spray in one nostril x1.  may repeat dose q 3 min until pt awakens or EMS arrives   Oxycodone  HCl 10 MG TABS Take 10 mg by mouth 4 (four) times daily as needed.   polyethylene glycol (MIRALAX  / GLYCOLAX ) 17 g packet Take 17 g by mouth daily.   Specialty Vitamins Products (BLINK NUTRITEARS) CAPS Take 1 capsule by mouth daily.   Tart Cherry 1200 MG CAPS Take 1,200 mg by mouth daily.   traZODone   (DESYREL ) 100 MG tablet Take 50 mg by mouth at bedtime.   urea (CARMOL) 40 % CREA    Vitamin E (VITAMIN E/D-ALPHA NATURAL) 268 MG (400 UNIT) CAPS Take 400 Units by mouth 3 (three) times a week.   No current facility-administered medications for this visit. (Other)   REVIEW OF SYSTEMS: ROS   Positive for: Neurological, Musculoskeletal, Cardiovascular, Eyes Negative for: Constitutional, Gastrointestinal, Skin, Genitourinary, HENT, Endocrine, Respiratory, Psychiatric, Allergic/Imm, Heme/Lymph Last edited by Elnor Avelina RAMAN, COT on 05/23/2024  2:31 PM.      ALLERGIES Allergies  Allergen Reactions   Fish Allergy Anaphylaxis    Can tolerate shellfish (can eat tuna, salmon, trout, swordfish and redrum). Allergic to fish with scales and fins.   Peanut Butter Flavoring Agent (Non-Screening) Anaphylaxis    Allergic to peanuts and some tree nuts. Can eat cashews, pistachios, and almonds.    Hazelnut (Filbert) Other (See Comments)   Other Other (See Comments)    Unknown.  Tree nuts -- most but not all   PAST MEDICAL HISTORY Past Medical History:  Diagnosis Date   ADHD    ADD   Allergic to pets    Allergies    Anemia    Anxiety    Aortic stenosis due to bicuspid aortic valve    bicuspid AV with mild AS, ascending aorta 3.40 cm 09/21/21 echo   Arthritis    hands, hip, shoulders back,   Childhood asthma    Chronic, continuous use of opioids    a.) has naloxone Rx available   Complication of anesthesia    a.) delayed and prolonged emergence; it lingers for a long time   Concussion    DDD (degenerative disc disease), cervical    a.) s/p ACDF C5-C7   DDD (degenerative disc disease), lumbar    a.) s/p L3-L5 fusion; b.) s/p revision L2-S1 laminectomy, PSF/TLIF, and removal of SCS 01/09/2024   Depression    Family history of adverse reaction to anesthesia    mom had skin reaction to anesthesia but cant remember what it was or exactly what happened   Fatigue    Fibromyalgia    GERD  (gastroesophageal reflux disease)    Heart murmur    Hyperlipidemia    Hypothyroidism    Insomnia    a.) uses trazodone  as needed   Meralgia paresthetica, right    Migraines    Numbness and tingling    Osteoarthritis    Personal history of noncompliance with medical treatment and regimen    a,) refuses recommended testing/procedures   Presence of pessary    Ringing in ears    Sleep apnea    a.) no nocturnal PAP therapy; utilizes nocturnal  mouth taping   Snoring    Past Surgical History:  Procedure Laterality Date   ABDOMINAL EXPOSURE N/A 04/12/2020   Procedure: ABDOMINAL EXPOSURE;  Surgeon: Gretta Lonni PARAS, MD;  Location: [facility] OR;  Service: Vascular;  Laterality: N/A;   ABDOMINOPLASTY     ANTERIOR CERVICAL DECOMP/DISCECTOMY FUSION N/A 06/28/2016   Procedure: ACDF C5-7 ANTERIOR CERVICAL DECOMPRESSION/DISCECTOMY FUSION 2 LEVELS;  Surgeon: Donaciano Sprang, MD;  Location: [facility] OR;  Service: Orthopedics;  Laterality: N/A;  Requests 3 hrs   ANTERIOR LUMBAR FUSION N/A 04/12/2020   Procedure: Anterior Lumbar Interbody Fusion - Lumbar four-Lumbar five , posterior instrumented fusion Lumbar four-five lumbar three -four;  Surgeon: Joshua Alm RAMAN, MD;  Location: [facility] OR;  Service: Neurosurgery;  Laterality: N/A;   BREAST BIOPSY Right 09/19/2021   CARPAL TUNNEL RELEASE Bilateral 1996   CATARACT EXTRACTION Bilateral    03/08/22 and 03/15/2022   COLONOSCOPY     DECOMPRESSION, NERVE, FEMORAL Right 04/17/2024   Procedure: DECOMPRESSION, NERVE, FEMORAL;  Surgeon: Claudene Penne ORN, MD;  Location: [facility] ORS;  Service: Neurosurgery;  Laterality: Right;  RIGHT SIDE LATERAL FEMORAL CUTANEOUS NERVE DECOMPRESSION AND TRANSPOSITION   DENTAL SURGERY     HERNIA REPAIR     times 2   LAMINECTOMY WITH POSTERIOR LATERAL ARTHRODESIS LEVEL 1 N/A 04/12/2020   Procedure: Posterior Instrumented Fusion Lumbar three to lumbar five;  Surgeon: Joshua Alm RAMAN, MD;  Location: [facility] OR;  Service: Neurosurgery;  Laterality: N/A;    LUMBAR LAMINECTOMY/DECOMPRESSION MICRODISCECTOMY  04/21/2015   Procedure: DECOMPRESSION LUMBAR THREE-LUMBAR FOUR;  Surgeon: Donaciano Sprang, MD;  Location: [facility] OR;  Service: Orthopedics;;   LUMBAR LAMINECTOMY/DECOMPRESSION MICRODISCECTOMY N/A 06/20/2017   Procedure: L4-5 decompression, L5-S1 left laminotomy/foraminotomy;  Surgeon: Sprang Donaciano, MD;  Location: [facility] OR;  Service: Orthopedics;  Laterality: N/A;  3 hrs   PARATHYROIDECTOMY  2012   RADIOACTIVE SEED GUIDED EXCISIONAL BREAST BIOPSY Right 10/28/2021   Procedure: RADIOACTIVE SEED GUIDED EXCISIONAL RIGHT BREAST BIOPSY;  Surgeon: Aron Shoulders, MD;  Location: [facility];  Service: General;  Laterality: Right;   SACROILIAC JOINT FUSION Right 07/24/2018   Procedure: SACROILIAC JOINT FUSION;  Surgeon: Sprang Donaciano, MD;  Location: [facility] OR;  Service: Orthopedics;  Laterality: Right;  90 mins   SACROILIAC JOINT FUSION Left 01/12/2022   Procedure: SPINAL CORD STIMULATOR PLACEMENT, LEFT SACROILIAC FUSION;  Surgeon: Sprang Donaciano, MD;  Location: [facility] OR;  Service: Orthopedics;  Laterality: Left;   SHOULDER ARTHROSCOPY W/ ACROMIAL REPAIR Right    SPINAL CORD STIMULATOR INSERTION Left 01/12/2022   Procedure: LUMBAR SPINAL CORD STIMULATOR INSERTION;  Surgeon: Sprang Donaciano, MD;  Location: [facility] OR;  Service: Orthopedics;  Laterality: Left;   TONSILLECTOMY     TOTAL HIP ARTHROPLASTY Right 11/10/2020   Procedure: TOTAL HIP ARTHROPLASTY ANTERIOR APPROACH;  Surgeon: Melodi Lerner, MD;  Location: [facility] ORS;  Service: Orthopedics;  Laterality: Right;    FAMILY HISTORY Family History  Problem Relation Age of Onset   Breast cancer Mother 24 - 17   COPD Mother    Cancer Mother    Hypertension Mother    Hyperlipidemia Mother    Heart disease Mother    Thyroid  disease Mother    Thyroid  disease Sister    Thyroid  disease Daughter    Pseudotumor cerebri Neg Hx    BRCA 1/2 Neg Hx    SOCIAL HISTORY Social History   Tobacco Use   Smoking  status: Former    Current packs/day: 0.00    Average packs/day: 0.3 packs/day for  4.0 years (1.0 ttl pk-yrs)    Types: Cigarettes    Start date: 39    Quit date: 50    Years since quitting: 49.9   Smokeless tobacco: Never   Tobacco comments:    social smoker stop at age 26  Vaping Use   Vaping status: Never Used  Substance Use Topics   Alcohol  use: Yes    Alcohol /week: 1.0 - 2.0 standard drink of alcohol     Types: 1 - 2 Glasses of wine per week    Comment: socially   Drug use: No       OPHTHALMIC EXAM:  Base Eye Exam     Visual Acuity (Snellen - Linear)       Right Left   Dist cc 20/20 20/20    Correction: Glasses         Tonometry (Tonopen, 2:20 PM)       Right Left   Pressure 13 12         Pupils       Pupils Dark Light Shape React APD   Right PERRL 3 2 Round Brisk None   Left PERRL 3 2 Round Brisk None         Visual Fields       Left Right    Full Full         Extraocular Movement       Right Left    Full, Ortho Full, Ortho         Neuro/Psych     Oriented x3: Yes   Mood/Affect: Normal         Dilation     Both eyes: 1.0% Mydriacyl, 2.5% Phenylephrine  @ 2:20 PM           Slit Lamp and Fundus Exam     Slit Lamp Exam       Right Left   Lids/Lashes Dermatochalasis - upper lid, mild MGD Dermatochalasis - upper lid, mild MGD, incomplete blink   Conjunctiva/Sclera White and quiet White and quiet   Cornea Mild arcus, well healed cataract wound, tear film debris, fine endo pigment 2-3+ inferior Punctate epithelial erosions greatest inferiorly, tear film debris, well healed cataract wound   Anterior Chamber deep and clear deep and clear   Iris Round and dilated Round and dilated   Lens PC IOL in good position PC IOL in good position, 1-2+ PCO   Anterior Vitreous Vitreous syneresis, Posterior vitreous detachment Vitreous syneresis, no pigment, Posterior vitreous detachment, operculum ST periphery         Fundus Exam        Right Left   Disc Pink and Sharp, mild tilt, focal PPP temporal, +elevation -- improved, no edema Pink, +elevation/edema greatest superior quad -- slightly improved, mild blurring of margins -- improved, sharp rim   C/D Ratio 0.2 0.2   Macula Flat, good foveal reflex, mild RPE mottling, No heme or edema Flat, good foveal reflex, mild RPE mottling, No heme or edema   Vessels attenuated, Tortuous attenuated, Tortuous   Periphery Attached, No heme, no RT/RD Attached, pigmented lattice with VR tuft at 0430, operculated hole at 0130 ora -- good laser surrounding all lesions, No heme, no new RT/RD/lattice           Refraction     Wearing Rx       Sphere Cylinder Axis Add   Right -1.00 +2.00 178 +2.00   Left -2.00 +2.00 002 +2.00    Type: Progressive  IMAGING AND PROCEDURES  Imaging and Procedures for 05/23/2024  OCT, Retina - OU - Both Eyes       Right Eye Quality was good. Central Foveal Thickness: 254. Progression has been stable. Findings include normal foveal contour, no IRF, no SRF (stable improvement in disc edema).   Left Eye Quality was good. Central Foveal Thickness: 277. Progression has been stable. Findings include normal foveal contour, no IRF, no SRF, epiretinal membrane, macular pucker (Mild persistent disc edema, focal ERM w/ mild pucker).   Notes *Images captured and stored on drive  Diagnosis / Impression:  NFP, no IRF/SRF OU OD: stable improvement in disc edema OS: Mild persistent disc edema, focal ERM w/ mild pucker  Clinical management:  See below  Abbreviations: NFP - Normal foveal profile. CME - cystoid macular edema. PED - pigment epithelial detachment. IRF - intraretinal fluid. SRF - subretinal fluid. EZ - ellipsoid zone. ERM - epiretinal membrane. ORA - outer retinal atrophy. ORT - outer retinal tubulation. SRHM - subretinal hyper-reflective material. IRHM - intraretinal hyper-reflective material      Yag Capsulotomy - OS -  Left Eye       Procedure note: YAG Capsulotomy, LEFT Eye  Informed consent obtained. Pre-op dilating drops (1% Topicamide and 2.5% Phenylephrine ), and topical anesthesia given. Power: 6.5 mJ Shots: 13 Posterior capsulotomy in cruciate formation performed without difficulty. Patient tolerated procedure well. No complications. Rx pred forte  4 times a day for 7 days, then stop. Pt received written and verbal post laser education. Recheck in 4-6 weeks w/ dilated exam.            ASSESSMENT/PLAN:    ICD-10-CM   1. Retinal hole of left eye  H33.322     2. Lattice degeneration of left retina  H35.412     3. Vitreoretinal tuft of left eye  Q14.1     4. Optic disc edema  H47.10 OCT, Retina - OU - Both Eyes    5. Pseudophakia, both eyes  Z96.1     6. Left posterior capsular opacification  H26.492 Yag Capsulotomy - OS - Left Eye    7. Dry eyes  H04.123      1. Operculated retinal hole, OS   - operculated hole at 0130 ora - s/p indirect laser retinopexy OS (10.24.23) -- good laser in place - no new RT/RD or lattice OS - f/u 1 year -- DFE, OCT  2,3. Lattice degeneration w/ VR tuft, left eye - pigmented lattice with VR tuft at 0430 - s/p laser retinopexy OS as above -- good laser in place - no new RT/RD or lattice - f/u 1 yr - DFE, OCT  4. Optic disc edema OU (OS > OD) -- improved, stable  - under the expert management of Dr. Manson, Neuro-Oph, at [facility]  - currently off Diamox  - Minimal disc edema / elevation OU on exam and OCT today  - BCVA remains 20/20 OU  - pt reports history of lower back / spine / nerve issues -- has a spinal cord stimulatorx  - pt had MRI orbits and LP done emergently on 11.9.23 due to worsening disc edema   MRI orbits showed findings suggesting of IIH and papilledema   LP showed opening pressure of 22cm H2O -- slightly elevated  - 11.29.23 -- pt saw Dr. Ines, Neurologist at [facility] who advised stoppage of Vit A derivative  meds  - 12.1.23 -- pt saw Dr. Manson, Neuro-Oph at [facility] and had her most recent  follow up on 01.26.24  - Disc edema may be relative IIH w/ mildly elevated opening pressure and low IOP  5,6. Pseudophakia OU, PCO OS  - s/p CE/IOL (Dr. Waylan, OD: 09.23.23, OS: 10.04.23) - IOL in good position, doing well  - 1-2+ PCO OS--pt reports significant glare in OS, reports seeing debris in OS  - recommend YAG cap OS today, 12.12.25  - RBA of procedure discussed, questions answered - informed consent obtained and signed - see procedure note - start PF QID OS x7 days  - f/u 4-6 weeks s/p YAG OS  7. Dry eyes OU - recommend artificial tears and lubricating ointment as needed; pt taking Meibo and oral supplement (blink) for dryness.   Ophthalmic Meds Ordered this visit:  Meds ordered this encounter  Medications   prednisoLONE  acetate (PRED FORTE ) 1 % ophthalmic suspension    Sig: Place 1 drop into the left eye 4 (four) times daily for 7 days.    Dispense:  10 mL    Refill:  0     Return for 4-6 weeks s/p YAG OS, Dilated Exam, OCT.  There are no Patient Instructions on file for this visit.  Explained the diagnoses, plan, and follow up with the patient and they expressed understanding.  Patient expressed understanding of the importance of proper follow up care.   This document serves as a record of services personally performed by Redell JUDITHANN Hans, MD, PhD. It was created on their behalf by Alan PARAS. Delores, OA an ophthalmic technician. The creation of this record is the provider's dictation and/or activities during the visit.    Electronically signed by: Alan PARAS. Delores, OA 05/27/2024 9:25 PM  This document serves as a record of services personally performed by Redell JUDITHANN Hans, MD, PhD. It was created on their behalf by Almetta Pesa, an ophthalmic technician. The creation of this record is the provider's dictation and/or activities during the visit.    Electronically signed by: Almetta Pesa,  OA, 05/27/2024  9:25 PM  Redell JUDITHANN Hans, M.D., Ph.D. Diseases & Surgery of the Retina and Vitreous [facility]  I have reviewed the above documentation for accuracy and completeness, and I agree with the above. Redell JUDITHANN Hans, M.D., Ph.D. 08/23/23 9:25 PM   Abbreviations: M myopia (nearsighted); A astigmatism; H hyperopia (farsighted); P presbyopia; Mrx spectacle prescription;  CTL contact lenses; OD right eye; OS left eye; OU both eyes  XT exotropia; ET esotropia; PEK punctate epithelial keratitis; PEE punctate epithelial erosions; DES dry eye syndrome; MGD meibomian gland dysfunction; ATs artificial tears; PFAT's preservative free artificial tears; NSC nuclear sclerotic cataract; PSC posterior subcapsular cataract; ERM epi-retinal membrane; PVD posterior vitreous detachment; RD retinal detachment; DM diabetes mellitus; DR diabetic retinopathy; NPDR non-proliferative diabetic retinopathy; PDR proliferative diabetic retinopathy; CSME clinically significant macular edema; DME diabetic macular edema; dbh dot blot hemorrhages; CWS cotton wool spot; POAG primary open angle glaucoma; C/D cup-to-disc ratio; HVF humphrey visual field; GVF goldmann visual field; OCT optical coherence tomography; IOP intraocular pressure; BRVO Branch retinal vein occlusion; CRVO central retinal vein occlusion; CRAO central retinal artery occlusion; BRAO branch retinal artery occlusion; RT retinal tear; SB scleral buckle; PPV pars plana vitrectomy; VH Vitreous hemorrhage; PRP panretinal laser photocoagulation; IVK intravitreal kenalog ; VMT vitreomacular traction; MH Macular hole;  NVD neovascularization of the disc; NVE neovascularization elsewhere; AREDS age related eye disease study; ARMD age related macular degeneration; POAG primary open angle glaucoma; EBMD epithelial/anterior basement membrane dystrophy; ACIOL anterior chamber intraocular  lens; IOL intraocular lens; PCIOL posterior chamber intraocular  lens; Phaco/IOL phacoemulsification with intraocular lens placement; PRK photorefractive keratectomy; LASIK laser assisted in situ keratomileusis; HTN hypertension; DM diabetes mellitus; COPD chronic obstructive pulmonary disease

## 2024-05-23 NOTE — Progress Notes (Shared)
 [facility] - Clinic Note  05/23/2024     CHIEF COMPLAINT Patient presents for No chief complaint on file.   HISTORY OF PRESENT ILLNESS: Courtney [name] is a 73 y.o. female who presents to the clinic today for:    Pt states she has stopped taking diamox per Dr. Manson at [facility], she is on one gram of valacyclovir, she states the past few weeks she has had an increase in migraines  Referring physician: Vernon Velna SAUNDERS, MD [address] [place],  [place] [address]  HISTORICAL INFORMATION:   Selected notes from the MEDICAL RECORD NUMBER Referred by Dr. Waylan for concern of operculated tear OS LEE:  Ocular Hx- PMH-    CURRENT MEDICATIONS: Current Outpatient Medications (Ophthalmic Drugs)  Medication Sig   Carboxymethylcellul-Glycerin  (REFRESH OPTIVE OP) Place 1 drop into both eyes daily as needed (dry eyes).   MIEBO 1.338 GM/ML SOLN Apply 1 drop to eye.   Polyethyl Glyc-Propyl Glyc PF (SYSTANE HYDRATION PF) 0.4-0.3 % SOLN    No current facility-administered medications for this visit. (Ophthalmic Drugs)   Current Outpatient Medications (Other)  Medication Sig   acetaminophen  (TYLENOL ) 500 MG tablet Take 2 tablets (1,000 mg total) by mouth every 6 (six) hours as needed.   Ascorbic Acid  (VITAMIN C) 1000 MG tablet Take 1,000 mg by mouth in the morning and at bedtime.   atorvastatin  (LIPITOR) 10 MG tablet Take 10 mg by mouth daily.   Biotin 5000 MCG CAPS Take 1 capsule by mouth daily.   CALCIUM  CITRATE PO Take 1,000 mg by mouth daily.   cholecalciferol  (VITAMIN D3) 25 MCG (1000 UNIT) tablet Take 2,000 Units by mouth daily.   clobetasol cream (TEMOVATE) 0.05 % Apply 1 Application topically 2 (two) times a week.   Cyanocobalamin (VITAMIN B 12 PO) Take 1,000 mcg by mouth daily.   diclofenac Sodium (VOLTAREN) 1 % GEL Apply topically as needed.   docusate sodium  (COLACE) 100 MG capsule Take 1 capsule (100 mg total) by mouth 2 (two) times daily.    DULoxetine  (CYMBALTA ) 30 MG capsule Take 30 mg by mouth daily after breakfast.    EPINEPHrine  (EPIPEN  JR) 0.15 MG/0.3ML injection Inject 0.15 mg into the muscle as needed for anaphylaxis.   estradiol (ESTRACE) 0.1 MG/GM vaginal cream Place 1 Applicatorful vaginally at bedtime.   FERROUS BISGLYCINATE  CHELATE PO Take 25 mg by mouth daily.    ferrous sulfate 325 (65 FE) MG tablet Take 325 mg by mouth 2 (two) times daily.   folic acid (FOLVITE) 1 MG tablet Take 800 mcg by mouth daily.   GEMTESA 75 MG TABS Take 1 tablet by mouth daily.   levothyroxine  (SYNTHROID , LEVOTHROID) 25 MCG tablet Take 1 1/2 tablets daily (Patient taking differently: Take 37.5 mcg by mouth daily before breakfast. Take 1 1/2 tablets daily)   lidocaine  4 % Apply 1 patch topically.   meloxicam (MOBIC) 15 MG tablet Take 15 mg by mouth daily.   methocarbamol  (ROBAXIN ) 500 MG tablet Take 500 mg by mouth 2 (two) times daily.   Multiple Vitamin (MULTIVITAMIN) tablet Take 1 tablet by mouth daily.   naloxone (NARCAN) nasal spray 4 mg/0.1 mL 1 spray in one nostril x1.  may repeat dose q 3 min until pt awakens or EMS arrives   oxyCODONE  (ROXICODONE ) 5 MG immediate release tablet Take 1 tablet (5 mg total) by mouth every 4 (four) hours as needed. USE FOR BREAKTHROUGH PAIN   Oxycodone  HCl 10 MG TABS  Take 10 mg by mouth 4 (four) times daily as needed.   polyethylene glycol (MIRALAX  / GLYCOLAX ) 17 g packet Take 17 g by mouth daily.   senna (SENOKOT) 8.6 MG TABS tablet Take 1 tablet (8.6 mg total) by mouth daily.   Specialty Vitamins Products (BLINK NUTRITEARS) CAPS Take 1 capsule by mouth daily.   Tart Cherry 1200 MG CAPS Take 1,200 mg by mouth daily.   traZODone  (DESYREL ) 100 MG tablet Take 50 mg by mouth at bedtime.   urea (CARMOL) 40 % CREA    Vitamin E (VITAMIN E/D-ALPHA NATURAL) 268 MG (400 UNIT) CAPS Take 400 Units by mouth 3 (three) times a week.   No current facility-administered medications for this visit. (Other)   REVIEW OF  SYSTEMS:    ALLERGIES Allergies  Allergen Reactions   Fish Allergy Anaphylaxis    Can tolerate shellfish (can eat tuna, salmon, trout, swordfish and redrum). Allergic to fish with scales and fins.   Peanut Butter Flavoring Agent (Non-Screening) Anaphylaxis    Allergic to peanuts and some tree nuts. Can eat cashews, pistachios, and almonds.    Hazelnut (Filbert) Other (See Comments)   Other Other (See Comments)    Unknown.  Tree nuts -- most but not all   PAST MEDICAL HISTORY Past Medical History:  Diagnosis Date   ADHD    ADD   Allergic to pets    Allergies    Anemia    Anxiety    Aortic stenosis due to bicuspid aortic valve    bicuspid AV with mild AS, ascending aorta 3.40 cm 09/21/21 echo   Arthritis    hands, hip, shoulders back,   Childhood asthma    Chronic, continuous use of opioids    a.) has naloxone Rx available   Complication of anesthesia    a.) delayed and prolonged emergence; it lingers for a long time   Concussion    DDD (degenerative disc disease), cervical    a.) s/p ACDF C5-C7   DDD (degenerative disc disease), lumbar    a.) s/p L3-L5 fusion; b.) s/p revision L2-S1 laminectomy, PSF/TLIF, and removal of SCS 01/09/2024   Depression    Family history of adverse reaction to anesthesia    mom had skin reaction to anesthesia but cant remember what it was or exactly what happened   Fatigue    Fibromyalgia    GERD (gastroesophageal reflux disease)    Heart murmur    Hyperlipidemia    Hypothyroidism    Insomnia    a.) uses trazodone  as needed   Meralgia paresthetica, right    Migraines    Numbness and tingling    Osteoarthritis    Personal history of noncompliance with medical treatment and regimen    a,) refuses recommended testing/procedures   Presence of pessary    Ringing in ears    Sleep apnea    a.) no nocturnal PAP therapy; utilizes nocturnal mouth taping   Snoring    Past Surgical History:  Procedure Laterality Date   ABDOMINAL EXPOSURE  N/A 04/12/2020   Procedure: ABDOMINAL EXPOSURE;  Surgeon: Gretta Lonni PARAS, MD;  Location: [facility] OR;  Service: Vascular;  Laterality: N/A;   ABDOMINOPLASTY     ANTERIOR CERVICAL DECOMP/DISCECTOMY FUSION N/A 06/28/2016   Procedure: ACDF C5-7 ANTERIOR CERVICAL DECOMPRESSION/DISCECTOMY FUSION 2 LEVELS;  Surgeon: Donaciano Sprang, MD;  Location: [facility] OR;  Service: Orthopedics;  Laterality: N/A;  Requests 3 hrs   ANTERIOR LUMBAR FUSION N/A 04/12/2020   Procedure: Anterior Lumbar Interbody  Fusion - Lumbar four-Lumbar five , posterior instrumented fusion Lumbar four-five lumbar three -four;  Surgeon: Joshua Alm RAMAN, MD;  Location: [facility] OR;  Service: Neurosurgery;  Laterality: N/A;   BREAST BIOPSY Right 09/19/2021   CARPAL TUNNEL RELEASE Bilateral 1996   CATARACT EXTRACTION Bilateral    03/08/22 and 03/15/2022   COLONOSCOPY     DECOMPRESSION, NERVE, FEMORAL Right 04/17/2024   Procedure: DECOMPRESSION, NERVE, FEMORAL;  Surgeon: Claudene Penne ORN, MD;  Location: [facility] ORS;  Service: Neurosurgery;  Laterality: Right;  RIGHT SIDE LATERAL FEMORAL CUTANEOUS NERVE DECOMPRESSION AND TRANSPOSITION   DENTAL SURGERY     HERNIA REPAIR     times 2   LAMINECTOMY WITH POSTERIOR LATERAL ARTHRODESIS LEVEL 1 N/A 04/12/2020   Procedure: Posterior Instrumented Fusion Lumbar three to lumbar five;  Surgeon: Joshua Alm RAMAN, MD;  Location: [facility] OR;  Service: Neurosurgery;  Laterality: N/A;   LUMBAR LAMINECTOMY/DECOMPRESSION MICRODISCECTOMY  04/21/2015   Procedure: DECOMPRESSION LUMBAR THREE-LUMBAR FOUR;  Surgeon: Donaciano Sprang, MD;  Location: [facility] OR;  Service: Orthopedics;;   LUMBAR LAMINECTOMY/DECOMPRESSION MICRODISCECTOMY N/A 06/20/2017   Procedure: L4-5 decompression, L5-S1 left laminotomy/foraminotomy;  Surgeon: Sprang Donaciano, MD;  Location: [facility] OR;  Service: Orthopedics;  Laterality: N/A;  3 hrs   PARATHYROIDECTOMY  2012   RADIOACTIVE SEED GUIDED EXCISIONAL BREAST BIOPSY Right 10/28/2021   Procedure: RADIOACTIVE SEED GUIDED  EXCISIONAL RIGHT BREAST BIOPSY;  Surgeon: Aron Shoulders, MD;  Location: [facility];  Service: General;  Laterality: Right;   SACROILIAC JOINT FUSION Right 07/24/2018   Procedure: SACROILIAC JOINT FUSION;  Surgeon: Sprang Donaciano, MD;  Location: [facility] OR;  Service: Orthopedics;  Laterality: Right;  90 mins   SACROILIAC JOINT FUSION Left 01/12/2022   Procedure: SPINAL CORD STIMULATOR PLACEMENT, LEFT SACROILIAC FUSION;  Surgeon: Sprang Donaciano, MD;  Location: [facility] OR;  Service: Orthopedics;  Laterality: Left;   SHOULDER ARTHROSCOPY W/ ACROMIAL REPAIR Right    SPINAL CORD STIMULATOR INSERTION Left 01/12/2022   Procedure: LUMBAR SPINAL CORD STIMULATOR INSERTION;  Surgeon: Sprang Donaciano, MD;  Location: [facility] OR;  Service: Orthopedics;  Laterality: Left;   TONSILLECTOMY     TOTAL HIP ARTHROPLASTY Right 11/10/2020   Procedure: TOTAL HIP ARTHROPLASTY ANTERIOR APPROACH;  Surgeon: Melodi Lerner, MD;  Location: [facility] ORS;  Service: Orthopedics;  Laterality: Right;    FAMILY HISTORY Family History  Problem Relation Age of Onset   Breast cancer Mother 51 - 50   COPD Mother    Cancer Mother    Hypertension Mother    Hyperlipidemia Mother    Heart disease Mother    Thyroid  disease Mother    Thyroid  disease Sister    Thyroid  disease Daughter    Pseudotumor cerebri Neg Hx    BRCA 1/2 Neg Hx    SOCIAL HISTORY Social History   Tobacco Use   Smoking status: Former    Current packs/day: 0.00    Average packs/day: 0.3 packs/day for 4.0 years (1.0 ttl pk-yrs)    Types: Cigarettes    Start date: 49    Quit date: 22    Years since quitting: 49.9   Smokeless tobacco: Never   Tobacco comments:    social smoker stop at age 52  Vaping Use   Vaping status: Never Used  Substance Use Topics   Alcohol  use: Yes    Alcohol /week: 1.0 - 2.0 standard drink of alcohol     Types: 1 - 2 Glasses of wine per week    Comment: socially   Drug use: No  OPHTHALMIC EXAM:  Base Eye Exam      Visual Acuity (Snellen - Linear)       Right Left   Dist cc 20/20 20/20    Correction: Glasses         Tonometry (Tonopen, 2:20 PM)       Right Left   Pressure 13 12         Pupils       Pupils Dark Light Shape React APD   Right PERRL 3 2 Round Brisk None   Left PERRL 3 2 Round Brisk None         Visual Fields       Left Right    Full Full         Extraocular Movement       Right Left    Full, Ortho Full, Ortho         Neuro/Psych     Oriented x3: Yes   Mood/Affect: Normal         Dilation     Both eyes: 1.0% Mydriacyl, 2.5% Phenylephrine  @ 2:20 PM           Slit Lamp and Fundus Exam     Slit Lamp Exam       Right Left   Lids/Lashes Dermatochalasis - upper lid, mild MGD Dermatochalasis - upper lid, mild MGD, incomplete blink   Conjunctiva/Sclera White and quiet White and quiet   Cornea Trace Punctate epithelial erosions, well healed cataract wound, tear film debris, fine endo pigment 2+ Punctate epithelial erosions greatest inferiorly, arcus, well healed cataract wound   Anterior Chamber deep and clear deep and clear   Iris Round and dilated Round and dilated   Lens PC IOL in good position PC IOL in good position   Anterior Vitreous Vitreous syneresis, Posterior vitreous detachment Vitreous syneresis, no pigment, Posterior vitreous detachment, operculum ST periphery         Fundus Exam       Right Left   Disc Pink and Sharp, mild tilt, focal PPP temporal, +elevation -- improved, no edema Pink, +elevation/edema greatest superior quad -- slightly improved, mild blurring of margins -- improved, sharp rim   C/D Ratio 0.2 0.2   Macula Flat, good foveal reflex, mild RPE mottling, No heme or edema Flat, good foveal reflex, mild RPE mottling, No heme or edema   Vessels attenuated, Tortuous attenuated, Tortuous   Periphery Attached, No heme Attached, pigmented lattice with VR tuft at 0430, operculated hole at 0130 ora -- good laser  surrounding all lesions, No heme, no new RT/RD/lattice           Refraction     Wearing Rx       Sphere Cylinder Axis Add   Right -1.00 +2.00 178 +2.00   Left -2.00 +2.00 002 +2.00    Type: Progressive           IMAGING AND PROCEDURES  Imaging and Procedures for 05/23/2024  OCT, Retina - OU - Both Eyes       Right Eye Quality was good. Central Foveal Thickness: 254. Progression has been stable. Findings include normal foveal contour, no IRF, no SRF (stable improvement in disc edema).   Left Eye Quality was good. Central Foveal Thickness: 279. Progression has been stable. Findings include normal foveal contour, no IRF, no SRF (Mild interval improvement in disc edema).   Notes *Images captured and stored on drive  Diagnosis / Impression:  NFP, no IRF/SRF OU OD: stable improvement  in disc edema OS: Mild interval improvement in disc edema   Clinical management:  See below  Abbreviations: NFP - Normal foveal profile. CME - cystoid macular edema. PED - pigment epithelial detachment. IRF - intraretinal fluid. SRF - subretinal fluid. EZ - ellipsoid zone. ERM - epiretinal membrane. ORA - outer retinal atrophy. ORT - outer retinal tubulation. SRHM - subretinal hyper-reflective material. IRHM - intraretinal hyper-reflective material            ASSESSMENT/PLAN:    ICD-10-CM   1. Retinal hole of left eye  H33.322     2. Lattice degeneration of left retina  H35.412     3. Vitreoretinal tuft of left eye  Q14.1     4. Optic disc edema  H47.10 OCT, Retina - OU - Both Eyes    5. Pseudophakia, both eyes  Z96.1     6. Dry eyes  H04.123      1. Operculated retinal hole, OS   - operculated hole at 0130 ora - s/p indirect laser retinopexy OS (10.24.23) -- good laser in place - no new RT/RD or lattice OS - f/u 1 year -- DFE, OCT  2,3. Lattice degeneration w/ VR tuft, left eye - pigmented lattice with VR tuft at 0430 - s/p laser retinopexy OS as above -- good laser  in place - no new RT/RD or lattice - f/u 1 yr - DFE, OCT  4. Optic disc edema OU (OS > OD) -- improving  - under the expert management of Dr. Manson, Neuro-Oph, at [facility]  - currently off Diamox  - interval decrease in disc edema / elevation OU on exam and OCT today  - BCVA remains 20/20 OU  - pt reports history of lower back / spine / nerve issues -- has a spinal cord stimulatorx  - pt had MRI orbits and LP done emergently on 11.9.23 due to worsening disc edema   MRI orbits showed findings suggesting of IIH and papilledema   LP showed opening pressure of 22cm H2O -- slightly elevated  - 11.29.23 -- pt saw Dr. Ines, Neurologist at [facility] who advised stoppage of Vit A derivative meds  - 12.1.23 -- pt saw Dr. Manson, Neuro-Oph at [facility] and had her most recent follow up on 01.26.24  - Disc edema may be relative IIH w/ mildly elevated opening pressure and low IOP  5. Pseudophakia OU  - s/p CE/IOL (Dr. Waylan, OD: 09.23.23, OS: 10.04.23)  - IOL in good position, doing well  - monitor  6. Dry eyes OU - recommend artificial tears and lubricating ointment as needed   Ophthalmic Meds Ordered this visit:  No orders of the defined types were placed in this encounter.    No follow-ups on file.  There are no Patient Instructions on file for this visit.  Explained the diagnoses, plan, and follow up with the patient and they expressed understanding.  Patient expressed understanding of the importance of proper follow up care.   This document serves as a record of services personally performed by Redell JUDITHANN Hans, MD, PhD. It was created on their behalf by Avelina Pereyra, COA an ophthalmic technician. The creation of this record is the provider's dictation and/or activities during the visit.   Electronically signed by: Avelina GORMAN Pereyra, COT  05/23/2024  2:27 PM   Redell JUDITHANN Hans, M.D., Ph.D. Diseases & Surgery of the Retina and Vitreous [facility] 05/23/2024  Abbreviations: M myopia (nearsighted); A astigmatism; H  hyperopia (farsighted); P presbyopia; Mrx spectacle prescription;  CTL contact lenses; OD right eye; OS left eye; OU both eyes  XT exotropia; ET esotropia; PEK punctate epithelial keratitis; PEE punctate epithelial erosions; DES dry eye syndrome; MGD meibomian gland dysfunction; ATs artificial tears; PFAT's preservative free artificial tears; NSC nuclear sclerotic cataract; PSC posterior subcapsular cataract; ERM epi-retinal membrane; PVD posterior vitreous detachment; RD retinal detachment; DM diabetes mellitus; DR diabetic retinopathy; NPDR non-proliferative diabetic retinopathy; PDR proliferative diabetic retinopathy; CSME clinically significant macular edema; DME diabetic macular edema; dbh dot blot hemorrhages; CWS cotton wool spot; POAG primary open angle glaucoma; C/D cup-to-disc ratio; HVF humphrey visual field; GVF goldmann visual field; OCT optical coherence tomography; IOP intraocular pressure; BRVO Branch retinal vein occlusion; CRVO central retinal vein occlusion; CRAO central retinal artery occlusion; BRAO branch retinal artery occlusion; RT retinal tear; SB scleral buckle; PPV pars plana vitrectomy; VH Vitreous hemorrhage; PRP panretinal laser photocoagulation; IVK intravitreal kenalog ; VMT vitreomacular traction; MH Macular hole;  NVD neovascularization of the disc; NVE neovascularization elsewhere; AREDS age related eye disease study; ARMD age related macular degeneration; POAG primary open angle glaucoma; EBMD epithelial/anterior basement membrane dystrophy; ACIOL anterior chamber intraocular lens; IOL intraocular lens; PCIOL posterior chamber intraocular lens; Phaco/IOL phacoemulsification with intraocular lens placement; PRK photorefractive keratectomy; LASIK laser assisted in situ keratomileusis; HTN hypertension; DM diabetes mellitus; COPD chronic obstructive pulmonary disease

## 2024-05-26 ENCOUNTER — Encounter: Payer: Self-pay | Admitting: Neurosurgery

## 2024-05-26 ENCOUNTER — Ambulatory Visit: Admitting: Neurosurgery

## 2024-05-26 VITALS — BP 124/62 | Temp 98.8°F | Ht 64.0 in | Wt 125.4 lb

## 2024-05-26 DIAGNOSIS — M79604 Pain in right leg: Secondary | ICD-10-CM

## 2024-05-26 DIAGNOSIS — G5711 Meralgia paresthetica, right lower limb: Secondary | ICD-10-CM

## 2024-05-26 DIAGNOSIS — Z09 Encounter for follow-up examination after completed treatment for conditions other than malignant neoplasm: Secondary | ICD-10-CM

## 2024-05-26 DIAGNOSIS — G588 Other specified mononeuropathies: Secondary | ICD-10-CM

## 2024-05-26 DIAGNOSIS — R200 Anesthesia of skin: Secondary | ICD-10-CM

## 2024-05-26 NOTE — Progress Notes (Signed)
° °  HISTORY OF PRESENT ILLNESS: 05/26/2024 [name] is status post right sided LF sided decompression and transposition.  Approximately 6 weeks postoperative.  Has had some improvement of her more focal pain and now it is more diffuse.  Originally was 8 or 9 now is more like a 5 to a 7.  She does continue to have some right-sided paraspinal pain.  She did have a significant postoperative hematoma which improved with time.  Her incision is slow to heal but is continuing to heal.   PHYSICAL EXAMINATION:   Vitals:   05/26/24 1353  BP: 124/62  Temp: 98.8 F (37.1 C)   General: Patient is well developed, well nourished, calm, collected, and in no apparent distress.  NEUROLOGICAL:  General: In no acute distress.  Awake, alert, oriented to person, place, and time. Pupils equal round and reactive to light.   Strength:  Side Iliopsoas Quads Hamstring PF DF EHL  R 5 5 5 5 5 5   L 5 5 5 5 5 5    Incision c/d/i   ROS (Neurologic): Negative except as noted above  IMAGING: No interval imaging to review   ASSESSMENT/PLAN:  Courtney [name] is having some improvement noted postoperatively.  She has noticed an improvement in the more focal area of her pain and now she is feeling more vague/diffuse pain and a significant amount of this is in her posterior right sided paraspinal region.  Best guess would be right sided superior cluneal distribution.  She does have a history of multiple spinal fusions.  She has noticed improvement in her right sided leg LF CN type sensation loss.  Will plan to make referral for right sided superior cluneal nerve injection  Penne MICAEL Sharps, MD/MS Department of Neurosurgery

## 2024-05-28 ENCOUNTER — Ambulatory Visit (HOSPITAL_COMMUNITY): Admission: RE | Admit: 2024-05-28 | Discharge: 2024-05-28 | Attending: Cardiology | Admitting: Cardiology

## 2024-05-28 ENCOUNTER — Ambulatory Visit: Payer: Self-pay | Admitting: Physician Assistant

## 2024-05-28 DIAGNOSIS — I350 Nonrheumatic aortic (valve) stenosis: Secondary | ICD-10-CM

## 2024-05-28 DIAGNOSIS — Q2381 Bicuspid aortic valve: Secondary | ICD-10-CM | POA: Diagnosis present

## 2024-05-28 LAB — ECHOCARDIOGRAM COMPLETE
AR max vel: 1.71 cm2
AV Area VTI: 1.56 cm2
AV Area mean vel: 1.44 cm2
AV Mean grad: 14 mmHg
AV Peak grad: 20.6 mmHg
Ao pk vel: 2.27 m/s
Area-P 1/2: 4.13 cm2
S' Lateral: 2.4 cm

## 2024-05-29 ENCOUNTER — Other Ambulatory Visit: Payer: Self-pay

## 2024-05-29 ENCOUNTER — Ambulatory Visit: Admitting: Sports Medicine

## 2024-05-29 VITALS — HR 79 | Ht 60.0 in | Wt 127.0 lb

## 2024-05-29 DIAGNOSIS — G8929 Other chronic pain: Secondary | ICD-10-CM | POA: Diagnosis not present

## 2024-05-29 DIAGNOSIS — M545 Low back pain, unspecified: Secondary | ICD-10-CM | POA: Diagnosis not present

## 2024-05-29 DIAGNOSIS — Z981 Arthrodesis status: Secondary | ICD-10-CM | POA: Diagnosis not present

## 2024-05-29 NOTE — Progress Notes (Signed)
 Ben Rahkeem Senft D.[facility] [address] [place] [address] Phone: [phone]   Assessment and Plan:     1. Chronic right-sided low back pain, unspecified whether sciatica present (Primary) 2.  S/P lumbar fusion -Chronic with exacerbation, subsequent visit - Patient presents for evaluation for ongoing right lower back pain.  Patient was evaluated by neurosurgery who thought patient may be experiencing superior cluneal nerve entrapment.  Patient was referred back to our office for injection.  Tolerated well per note below. - Continue HEP - Use meloxicam 15 mg daily as needed for breakthrough pain.  Recommend limiting chronic NSAIDs to 1-2 doses per week to prevent long-term side effects. Use Tylenol  500 to 1000 mg tablets 2-3 times a day as needed for day-to-day pain relief.      Procedure: Superior Cluneal Nerve Block Side: Right Indication: Chronic lower back pain localized to the superior gluteal region    US  Indication:  - accuracy is paramount for diagnosis - to ensure therapeutic efficacy or procedural success - to reduce procedural risk  Procedure: After explaining the procedure, viable alternatives, risks, and answering any questions, consent was given verbally. The patient was placed in the prone position. The skin over the posterior iliac crest was cleaned with chlorhexidine  prep. Using a lateral to medial approach, needle was advanced under ultrasound guidance to the superior cluneal nerve target area, identified approximately 7-8 cm lateral to the midline at the level of the iliac crest .  A total of 4 mL of 1% lidocaine  mixed with 1 mL of Kenalog  40 mg/mL was injected incrementally around the superior cluneal nerve distribution. Needle was removed and dressing placed and post injection instructions were given including a discussion of possible return of pain today after the anesthetic wears off until the steroid starts to work in 1-3  days.   Pt was advised to call or return to clinic if these symptoms worsen or fail to improve as anticipated. Images permanently stored.       Pertinent previous records reviewed include neurosurgery note 04/25/2024     Follow Up: As needed if no improvement or worsening of symptoms.  If patient has significant improvement in symptoms, procedure could be repeated in the future.  If no significant improvement in symptoms, would recommend reevaluation with neurosurgery to discuss next steps in treatment.  May additionally schedule follow-up visit for further evaluation of ongoing shoulder pain.  Could obtain updated x-ray of shoulder and discuss injection versus surgical referral options        Subjective:   I, Moenique Parris, am serving as a neurosurgeon for Doctor Morene Mace  Chief Complaint: right hip pain   HPI:   05/29/2024 Patient is a 73 year old female with right hip pain. Patient states pain right sided glute pain was seen by PCP Patient sent in outside image as she preferred to have a virtual visit today that is also attached to this note.  She does have extensive bruising that can be seen.  She also has some erythema surrounding her incision.  The more medial aspect of her incision appears to be slightly open.  The patient states that she has not had any drainage from this area and has been putting a paper tape over it.  I have instructed her to not put tape over it without gauze over the actual incision site first.  I told her I would like her to come in to clinic so I can put  Steri-Strips or Medihoney on the incision and to keep a better eye on it to make sure it heals properly.  Patient like to hold off at this time and use supplies that she has at home.  She states that she will send in a picture on Monday to keep me updated.  She does have some areas of pain near her pubic bone.  However she does feel overall that her back, balance, and right leg strength has improved.  She is back  to her baseline dose of oxycodone .  Again, concerns over incision were discussed with the patient.  She was instructed to let me know if she starts to have any drainage from the area in order to come in for wound reinforcement.    Relevant Historical Information: GERD, history of lumbar fusion  Additional pertinent review of systems negative.  Current Medications[1]   Objective:     Vitals:   05/29/24 1519  Pulse: 79  SpO2: 99%  Weight: 127 lb (57.6 kg)  Height: 5' (1.524 m)      Body mass index is 24.8 kg/m.    Physical Exam:    Gen: Appears well, nad, nontoxic and pleasant Psych: Alert and oriented, appropriate mood and affect Neuro: sensation intact, strength is 5/5 in upper and lower extremities, muscle tone wnl Skin: no susupicious lesions or rashes  Back - Normal skin, Spine with normal alignment and no deformity.   TTP right SI joints, right lumbar paraspinal musculature, right gluteal musculature, right quadratus lumborum Gait normal   Electronically signed by:  Odis Mace D[facility] 3:46 PM 05/29/2024     [1]  Current Outpatient Medications:    acetaminophen  (TYLENOL ) 500 MG tablet, Take 2 tablets (1,000 mg total) by mouth every 6 (six) hours as needed., Disp: 30 tablet, Rfl: 0   Ascorbic Acid  (VITAMIN C) 1000 MG tablet, Take 1,000 mg by mouth in the morning and at bedtime., Disp: , Rfl:    atorvastatin  (LIPITOR) 10 MG tablet, Take 10 mg by mouth daily., Disp: , Rfl:    Biotin 5000 MCG CAPS, Take 1 capsule by mouth daily., Disp: , Rfl:    CALCIUM  CITRATE PO, Take 1,000 mg by mouth daily., Disp: , Rfl:    Carboxymethylcellul-Glycerin  (REFRESH OPTIVE OP), Place 1 drop into both eyes daily as needed (dry eyes)., Disp: , Rfl:    cholecalciferol  (VITAMIN D3) 25 MCG (1000 UNIT) tablet, Take 2,000 Units by mouth daily., Disp: , Rfl:    clobetasol cream (TEMOVATE) 0.05 %, Apply 1 Application topically 2 (two) times a week., Disp: , Rfl:     Cyanocobalamin (VITAMIN B 12 PO), Take 1,000 mcg by mouth daily., Disp: , Rfl:    diclofenac Sodium (VOLTAREN) 1 % GEL, Apply topically as needed., Disp: , Rfl:    DULoxetine  (CYMBALTA ) 30 MG capsule, Take 30 mg by mouth daily after breakfast. , Disp: , Rfl:    EPINEPHrine  (EPIPEN  JR) 0.15 MG/0.3ML injection, Inject 0.15 mg into the muscle as needed for anaphylaxis., Disp: , Rfl:    estradiol (ESTRACE) 0.1 MG/GM vaginal cream, Place 1 Applicatorful vaginally at bedtime., Disp: , Rfl:    FERROUS BISGLYCINATE  CHELATE PO, Take 25 mg by mouth daily. , Disp: , Rfl:    ferrous sulfate 325 (65 FE) MG tablet, Take 325 mg by mouth 2 (two) times daily., Disp: , Rfl:    folic acid (FOLVITE) 1 MG tablet, Take 800 mcg by mouth daily., Disp: , Rfl:  GEMTESA 75 MG TABS, Take 1 tablet by mouth daily., Disp: , Rfl:    levothyroxine  (SYNTHROID , LEVOTHROID) 25 MCG tablet, Take 1 1/2 tablets daily (Patient taking differently: Take 37.5 mcg by mouth daily before breakfast. Take 1 1/2 tablets daily), Disp: 130 tablet, Rfl: 2   lidocaine  4 %, Apply 1 patch topically., Disp: , Rfl:    meloxicam (MOBIC) 15 MG tablet, Take 15 mg by mouth daily., Disp: , Rfl:    methocarbamol  (ROBAXIN ) 500 MG tablet, Take 500 mg by mouth 2 (two) times daily., Disp: , Rfl:    MIEBO 1.338 GM/ML SOLN, Apply 1 drop to eye., Disp: , Rfl:    Multiple Vitamin (MULTIVITAMIN) tablet, Take 1 tablet by mouth daily., Disp: , Rfl:    naloxone (NARCAN) nasal spray 4 mg/0.1 mL, 1 spray in one nostril x1.  may repeat dose q 3 min until pt awakens or EMS arrives, Disp: , Rfl:    Oxycodone  HCl 10 MG TABS, Take 10 mg by mouth 4 (four) times daily as needed., Disp: , Rfl:    Polyethyl Glyc-Propyl Glyc PF (SYSTANE HYDRATION PF) 0.4-0.3 % SOLN, , Disp: , Rfl:    polyethylene glycol (MIRALAX  / GLYCOLAX ) 17 g packet, Take 17 g by mouth daily., Disp: , Rfl:    prednisoLONE  acetate (PRED FORTE ) 1 % ophthalmic suspension, Place 1 drop into the left eye 4 (four)  times daily for 7 days., Disp: 10 mL, Rfl: 0   Specialty Vitamins Products (BLINK NUTRITEARS) CAPS, Take 1 capsule by mouth daily., Disp: , Rfl:    Tart Cherry 1200 MG CAPS, Take 1,200 mg by mouth daily., Disp: , Rfl:    traZODone  (DESYREL ) 100 MG tablet, Take 50 mg by mouth at bedtime., Disp: , Rfl:    urea (CARMOL) 40 % CREA, , Disp: , Rfl:    Vitamin E (VITAMIN E/D-ALPHA NATURAL) 268 MG (400 UNIT) CAPS, Take 400 Units by mouth 3 (three) times a week., Disp: , Rfl:

## 2024-06-02 NOTE — Progress Notes (Unsigned)
 "               Odis Mace D[facility] [address] [place] [address] Phone: [phone]   Assessment and Plan:     1. Chronic right shoulder pain (Primary) 2. Primary osteoarthritis of right shoulder -Chronic with exacerbation, initial sports medicine visit - Consistent with severe glenohumeral osteoarthritis of right shoulder based on HPI, x-ray imaging - X-ray obtained in clinic.  My interpretation: No acute fracture or dislocation.  Severe degenerative changes in glenohumeral joint including large humeral spur, decreased glenohumeral space, sclerosis - Patient has not had significant benefit with HEP and medication courses - Patient elected for intra-articular shoulder CSI.  Tolerated well per note below - Continue HEP - Will refer to orthopedic surgery to discuss next steps and possible surgical planning  Procedure: Ultrasound Guided Glenohumeral Joint Injection Side: Right Diagnosis: Right shoulder osteoarthritis US  Indication:  - accuracy is paramount for diagnosis - to ensure therapeutic efficacy or procedural success - to reduce procedural risk  After explaining the procedure, viable alternatives, risks, and answering any questions, consent was given verbally. The site was cleaned with chlorhexidine  prep. An ultrasound transducer was placed on the posterior shoulder.  The posterior capsule, labrum, and infraspinatus were identified.  A steroid injection was performed under ultrasound guidance with sterile technique using  2ml of 1% lidocaine  without epinephrine  and 40 mg of triamcinolone  (KENALOG ) 40mg /ml. This was well tolerated and resulted in  relief.  Needle was removed and dressing placed and post injection instructions were given including  a discussion of likely return of pain today after the anesthetic wears off (with the possibility of worsened pain) until the steroid starts to work in 1-3 days.   Pt was advised to call or return to clinic  if these symptoms worsen or fail to improve as anticipated.   Images permanently stored.     Pertinent previous records reviewed include none   Follow Up: As needed.  Could consider repeat injection   Subjective:   I, Isyss Espinal, am serving as a neurosurgeon for Doctor Morene Mace   Chief Complaint: right shoulder pain     HPI:   06/03/2024 Patient is a 73 year old female with right shoulder pain. Patient states she had a fall 20 years ago and a surgery 15 years ago. Shoulder pain that is deep and down the arm. No numbness or tingling. Decreased ROM . She is a side sleeper , she is not able to sleep through the night due to pain. Pain meds dont seem to help. Pain is constant   Relevant Historical Information: GERD, history of lumbar fusion    Additional pertinent review of systems negative.  Current Medications[1]   Objective:     Vitals:   06/03/24 1036  Pulse: 69  SpO2: 99%  Weight: 127 lb (57.6 kg)  Height: 5' (1.524 m)      Body mass index is 24.8 kg/m.    Physical Exam:    Gen: Appears well, nad, nontoxic and pleasant Neuro:sensation intact, strength is 5/5, muscle tone wnl Skin: no suspicious lesion or defmority Psych: A&O, appropriate mood and affect  Right shoulder:  No deformity, swelling or muscle wasting No scapular winging FF 170, abd 170, int 0, ext 90 NTTP over the [place], clavicle, ac, coracoid, biceps groove, humerus, deltoid, trapezius, cervical spine Negative Spurling's test bilat FROM of neck    Electronically signed by:  Odis Mace [facility]  10:54 AM 06/03/2024     [1]  Current Outpatient Medications:    acetaminophen  (TYLENOL ) 500 MG tablet, Take 2 tablets (1,000 mg total) by mouth every 6 (six) hours as needed., Disp: 30 tablet, Rfl: 0   Ascorbic Acid  (VITAMIN C) 1000 MG tablet, Take 1,000 mg by mouth in the morning and at bedtime., Disp: , Rfl:    atorvastatin  (LIPITOR) 10 MG tablet, Take 10 mg by mouth  daily., Disp: , Rfl:    Biotin 5000 MCG CAPS, Take 1 capsule by mouth daily., Disp: , Rfl:    CALCIUM  CITRATE PO, Take 1,000 mg by mouth daily., Disp: , Rfl:    Carboxymethylcellul-Glycerin  (REFRESH OPTIVE OP), Place 1 drop into both eyes daily as needed (dry eyes)., Disp: , Rfl:    cholecalciferol  (VITAMIN D3) 25 MCG (1000 UNIT) tablet, Take 2,000 Units by mouth daily., Disp: , Rfl:    clobetasol cream (TEMOVATE) 0.05 %, Apply 1 Application topically 2 (two) times a week., Disp: , Rfl:    Cyanocobalamin (VITAMIN B 12 PO), Take 1,000 mcg by mouth daily., Disp: , Rfl:    diclofenac Sodium (VOLTAREN) 1 % GEL, Apply topically as needed., Disp: , Rfl:    DULoxetine  (CYMBALTA ) 30 MG capsule, Take 30 mg by mouth daily after breakfast. , Disp: , Rfl:    EPINEPHrine  (EPIPEN  JR) 0.15 MG/0.3ML injection, Inject 0.15 mg into the muscle as needed for anaphylaxis., Disp: , Rfl:    estradiol (ESTRACE) 0.1 MG/GM vaginal cream, Place 1 Applicatorful vaginally at bedtime., Disp: , Rfl:    FERROUS BISGLYCINATE  CHELATE PO, Take 25 mg by mouth daily. , Disp: , Rfl:    ferrous sulfate 325 (65 FE) MG tablet, Take 325 mg by mouth 2 (two) times daily., Disp: , Rfl:    folic acid (FOLVITE) 1 MG tablet, Take 800 mcg by mouth daily., Disp: , Rfl:    GEMTESA 75 MG TABS, Take 1 tablet by mouth daily., Disp: , Rfl:    levothyroxine  (SYNTHROID , LEVOTHROID) 25 MCG tablet, Take 1 1/2 tablets daily (Patient taking differently: Take 37.5 mcg by mouth daily before breakfast. Take 1 1/2 tablets daily), Disp: 130 tablet, Rfl: 2   lidocaine  4 %, Apply 1 patch topically., Disp: , Rfl:    meloxicam (MOBIC) 15 MG tablet, Take 15 mg by mouth daily., Disp: , Rfl:    methocarbamol  (ROBAXIN ) 500 MG tablet, Take 500 mg by mouth 2 (two) times daily., Disp: , Rfl:    MIEBO 1.338 GM/ML SOLN, Apply 1 drop to eye., Disp: , Rfl:    Multiple Vitamin (MULTIVITAMIN) tablet, Take 1 tablet by mouth daily., Disp: , Rfl:    naloxone (NARCAN) nasal spray  4 mg/0.1 mL, 1 spray in one nostril x1.  may repeat dose q 3 min until pt awakens or EMS arrives, Disp: , Rfl:    Oxycodone  HCl 10 MG TABS, Take 10 mg by mouth 4 (four) times daily as needed., Disp: , Rfl:    Polyethyl Glyc-Propyl Glyc PF (SYSTANE HYDRATION PF) 0.4-0.3 % SOLN, , Disp: , Rfl:    polyethylene glycol (MIRALAX  / GLYCOLAX ) 17 g packet, Take 17 g by mouth daily., Disp: , Rfl:    Specialty Vitamins Products (BLINK NUTRITEARS) CAPS, Take 1 capsule by mouth daily., Disp: , Rfl:    Tart Cherry 1200 MG CAPS, Take 1,200 mg by mouth daily., Disp: , Rfl:    traZODone  (DESYREL ) 100 MG tablet, Take 50 mg by mouth at bedtime., Disp: , Rfl:  urea (CARMOL) 40 % CREA, , Disp: , Rfl:    Vitamin E (VITAMIN E/D-ALPHA NATURAL) 268 MG (400 UNIT) CAPS, Take 400 Units by mouth 3 (three) times a week., Disp: , Rfl:   "

## 2024-06-02 NOTE — Progress Notes (Signed)
 Spoke with the patient regarding echo results. No questions or concerns at this time. She also understand that this will be repeated in 2 weeks.

## 2024-06-03 ENCOUNTER — Ambulatory Visit: Admitting: Sports Medicine

## 2024-06-03 ENCOUNTER — Ambulatory Visit

## 2024-06-03 ENCOUNTER — Other Ambulatory Visit: Payer: Self-pay

## 2024-06-03 VITALS — HR 69 | Ht 60.0 in | Wt 127.0 lb

## 2024-06-03 DIAGNOSIS — G8929 Other chronic pain: Secondary | ICD-10-CM

## 2024-06-03 DIAGNOSIS — M25511 Pain in right shoulder: Secondary | ICD-10-CM | POA: Diagnosis not present

## 2024-06-03 DIAGNOSIS — M19011 Primary osteoarthritis, right shoulder: Secondary | ICD-10-CM

## 2024-06-03 NOTE — Patient Instructions (Signed)
 Ortho referral  As needed follow up

## 2024-06-18 ENCOUNTER — Ambulatory Visit: Payer: Self-pay | Admitting: Sports Medicine

## 2024-07-02 NOTE — Progress Notes (Signed)
 " [facility] - Clinic Note  07/04/2024     CHIEF COMPLAINT Patient presents for Retina Follow Up   HISTORY OF PRESENT ILLNESS: Courtney [name] is a 74 y.o. female who presents to the clinic today for:   HPI     Retina Follow Up   Patient presents with  Other.  In left eye.  This started 4 months ago.  Duration of 6 weeks.  Since onset it is stable.  I, the attending physician,  performed the HPI with the patient and updated documentation appropriately.        Comments   Pt is here for 6 week s/p YAG OS. Pt states VA OS is a lot better. Pt c/o floater OS that comes and goes. Pt denies FOL. Pt uses Miebo OU BID and Stye gtts PRN OU.       Last edited by Valdemar Rogue, MD on 07/11/2024  1:18 AM.     Pt states the vision in the left eye is a lot better.  Referring physician: Vernon Velna SAUNDERS, MD [address] [place],  [place] [address]  HISTORICAL INFORMATION:   Selected notes from the MEDICAL RECORD NUMBER Referred by Dr. Waylan for concern of operculated tear OS LEE:  Ocular Hx- PMH-    CURRENT MEDICATIONS: Current Outpatient Medications (Ophthalmic Drugs)  Medication Sig   Carboxymethylcellul-Glycerin  (REFRESH OPTIVE OP) Place 1 drop into both eyes daily as needed (dry eyes).   MIEBO 1.338 GM/ML SOLN Apply 1 drop to eye.   Polyethyl Glyc-Propyl Glyc PF (SYSTANE HYDRATION PF) 0.4-0.3 % SOLN    No current facility-administered medications for this visit. (Ophthalmic Drugs)   Current Outpatient Medications (Other)  Medication Sig   acetaminophen  (TYLENOL ) 500 MG tablet Take 2 tablets (1,000 mg total) by mouth every 6 (six) hours as needed.   Ascorbic Acid  (VITAMIN C) 1000 MG tablet Take 1,000 mg by mouth in the morning and at bedtime.   atorvastatin  (LIPITOR) 10 MG tablet Take 10 mg by mouth daily.   Biotin 5000 MCG CAPS Take 1 capsule by mouth daily.   CALCIUM  CITRATE PO Take 1,000 mg by mouth daily.   cholecalciferol  (VITAMIN  D3) 25 MCG (1000 UNIT) tablet Take 2,000 Units by mouth daily.   clobetasol cream (TEMOVATE) 0.05 % Apply 1 Application topically 2 (two) times a week.   Cyanocobalamin (VITAMIN B 12 PO) Take 1,000 mcg by mouth daily.   diclofenac Sodium (VOLTAREN) 1 % GEL Apply topically as needed.   DULoxetine  (CYMBALTA ) 30 MG capsule Take 30 mg by mouth daily after breakfast.    EPINEPHrine  (EPIPEN  JR) 0.15 MG/0.3ML injection Inject 0.15 mg into the muscle as needed for anaphylaxis.   estradiol (ESTRACE) 0.1 MG/GM vaginal cream Place 1 Applicatorful vaginally at bedtime.   FERROUS BISGLYCINATE  CHELATE PO Take 25 mg by mouth daily.    ferrous sulfate 325 (65 FE) MG tablet Take 325 mg by mouth 2 (two) times daily.   folic acid (FOLVITE) 1 MG tablet Take 800 mcg by mouth daily.   GEMTESA 75 MG TABS Take 1 tablet by mouth daily.   levothyroxine  (SYNTHROID , LEVOTHROID) 25 MCG tablet Take 1 1/2 tablets daily (Patient taking differently: Take 37.5 mcg by mouth daily before breakfast. Take 1 1/2 tablets daily)   lidocaine  4 % Apply 1 patch topically.   meloxicam (MOBIC) 15 MG tablet Take 15 mg by mouth daily.   methocarbamol  (ROBAXIN ) 500 MG tablet Take 500 mg by  mouth 2 (two) times daily.   Multiple Vitamin (MULTIVITAMIN) tablet Take 1 tablet by mouth daily.   naloxone (NARCAN) nasal spray 4 mg/0.1 mL 1 spray in one nostril x1.  may repeat dose q 3 min until pt awakens or EMS arrives   Oxycodone  HCl 10 MG TABS Take 10 mg by mouth 4 (four) times daily as needed.   polyethylene glycol (MIRALAX  / GLYCOLAX ) 17 g packet Take 17 g by mouth daily.   Specialty Vitamins Products (BLINK NUTRITEARS) CAPS Take 1 capsule by mouth daily.   Tart Cherry 1200 MG CAPS Take 1,200 mg by mouth daily.   traZODone  (DESYREL ) 100 MG tablet Take 50 mg by mouth at bedtime.   urea (CARMOL) 40 % CREA    Vitamin E (VITAMIN E/D-ALPHA NATURAL) 268 MG (400 UNIT) CAPS Take 400 Units by mouth 3 (three) times a week.   No current  facility-administered medications for this visit. (Other)   REVIEW OF SYSTEMS:    ALLERGIES Allergies  Allergen Reactions   Fish Allergy Anaphylaxis    Can tolerate shellfish (can eat tuna, salmon, trout, swordfish and redrum). Allergic to fish with scales and fins.   Peanut Butter Flavoring Agent (Non-Screening) Anaphylaxis    Allergic to peanuts and some tree nuts. Can eat cashews, pistachios, and almonds.    Hazelnut (Filbert) Other (See Comments)   Other Other (See Comments)    Unknown.  Tree nuts -- most but not all   PAST MEDICAL HISTORY Past Medical History:  Diagnosis Date   ADHD    ADD   Allergic to pets    Allergies    Anemia    Anxiety    Aortic stenosis due to bicuspid aortic valve    bicuspid AV with mild AS, ascending aorta 3.40 cm 09/21/21 echo   Arthritis    hands, hip, shoulders back,   Childhood asthma    Chronic, continuous use of opioids    a.) has naloxone Rx available   Complication of anesthesia    a.) delayed and prolonged emergence; it lingers for a long time   Concussion    DDD (degenerative disc disease), cervical    a.) s/p ACDF C5-C7   DDD (degenerative disc disease), lumbar    a.) s/p L3-L5 fusion; b.) s/p revision L2-S1 laminectomy, PSF/TLIF, and removal of SCS 01/09/2024   Depression    Family history of adverse reaction to anesthesia    mom had skin reaction to anesthesia but cant remember what it was or exactly what happened   Fatigue    Fibromyalgia    GERD (gastroesophageal reflux disease)    Heart murmur    Hyperlipidemia    Hypothyroidism    Insomnia    a.) uses trazodone  as needed   Meralgia paresthetica, right    Migraines    Numbness and tingling    Osteoarthritis    Personal history of noncompliance with medical treatment and regimen    a,) refuses recommended testing/procedures   Presence of pessary    Ringing in ears    Sleep apnea    a.) no nocturnal PAP therapy; utilizes nocturnal mouth taping   Snoring     Past Surgical History:  Procedure Laterality Date   ABDOMINAL EXPOSURE N/A 04/12/2020   Procedure: ABDOMINAL EXPOSURE;  Surgeon: Gretta Lonni PARAS, MD;  Location: [facility] OR;  Service: Vascular;  Laterality: N/A;   ABDOMINOPLASTY     ANTERIOR CERVICAL DECOMP/DISCECTOMY FUSION N/A 06/28/2016   Procedure: ACDF C5-7 ANTERIOR CERVICAL DECOMPRESSION/DISCECTOMY  FUSION 2 LEVELS;  Surgeon: Donaciano Sprang, MD;  Location: [facility] OR;  Service: Orthopedics;  Laterality: N/A;  Requests 3 hrs   ANTERIOR LUMBAR FUSION N/A 04/12/2020   Procedure: Anterior Lumbar Interbody Fusion - Lumbar four-Lumbar five , posterior instrumented fusion Lumbar four-five lumbar three -four;  Surgeon: Joshua Alm RAMAN, MD;  Location: [facility] OR;  Service: Neurosurgery;  Laterality: N/A;   BREAST BIOPSY Right 09/19/2021   CARPAL TUNNEL RELEASE Bilateral 1996   CATARACT EXTRACTION Bilateral    03/08/22 and 03/15/2022   COLONOSCOPY     DECOMPRESSION, NERVE, FEMORAL Right 04/17/2024   Procedure: DECOMPRESSION, NERVE, FEMORAL;  Surgeon: Claudene Penne ORN, MD;  Location: [facility] ORS;  Service: Neurosurgery;  Laterality: Right;  RIGHT SIDE LATERAL FEMORAL CUTANEOUS NERVE DECOMPRESSION AND TRANSPOSITION   DENTAL SURGERY     HERNIA REPAIR     times 2   LAMINECTOMY WITH POSTERIOR LATERAL ARTHRODESIS LEVEL 1 N/A 04/12/2020   Procedure: Posterior Instrumented Fusion Lumbar three to lumbar five;  Surgeon: Joshua Alm RAMAN, MD;  Location: [facility] OR;  Service: Neurosurgery;  Laterality: N/A;   LUMBAR LAMINECTOMY/DECOMPRESSION MICRODISCECTOMY  04/21/2015   Procedure: DECOMPRESSION LUMBAR THREE-LUMBAR FOUR;  Surgeon: Donaciano Sprang, MD;  Location: [facility] OR;  Service: Orthopedics;;   LUMBAR LAMINECTOMY/DECOMPRESSION MICRODISCECTOMY N/A 06/20/2017   Procedure: L4-5 decompression, L5-S1 left laminotomy/foraminotomy;  Surgeon: Sprang Donaciano, MD;  Location: [facility] OR;  Service: Orthopedics;  Laterality: N/A;  3 hrs   PARATHYROIDECTOMY  2012   RADIOACTIVE SEED GUIDED EXCISIONAL  BREAST BIOPSY Right 10/28/2021   Procedure: RADIOACTIVE SEED GUIDED EXCISIONAL RIGHT BREAST BIOPSY;  Surgeon: Aron Shoulders, MD;  Location: [facility];  Service: General;  Laterality: Right;   SACROILIAC JOINT FUSION Right 07/24/2018   Procedure: SACROILIAC JOINT FUSION;  Surgeon: Sprang Donaciano, MD;  Location: [facility] OR;  Service: Orthopedics;  Laterality: Right;  90 mins   SACROILIAC JOINT FUSION Left 01/12/2022   Procedure: SPINAL CORD STIMULATOR PLACEMENT, LEFT SACROILIAC FUSION;  Surgeon: Sprang Donaciano, MD;  Location: [facility] OR;  Service: Orthopedics;  Laterality: Left;   SHOULDER ARTHROSCOPY W/ ACROMIAL REPAIR Right    SPINAL CORD STIMULATOR INSERTION Left 01/12/2022   Procedure: LUMBAR SPINAL CORD STIMULATOR INSERTION;  Surgeon: Sprang Donaciano, MD;  Location: [facility] OR;  Service: Orthopedics;  Laterality: Left;   TONSILLECTOMY     TOTAL HIP ARTHROPLASTY Right 11/10/2020   Procedure: TOTAL HIP ARTHROPLASTY ANTERIOR APPROACH;  Surgeon: Melodi Lerner, MD;  Location: [facility] ORS;  Service: Orthopedics;  Laterality: Right;    FAMILY HISTORY Family History  Problem Relation Age of Onset   Breast cancer Mother 51 - 48   COPD Mother    Cancer Mother    Hypertension Mother    Hyperlipidemia Mother    Heart disease Mother    Thyroid  disease Mother    Thyroid  disease Sister    Thyroid  disease Daughter    Pseudotumor cerebri Neg Hx    BRCA 1/2 Neg Hx    SOCIAL HISTORY Social History   Tobacco Use   Smoking status: Former    Current packs/day: 0.00    Average packs/day: 0.3 packs/day for 4.0 years (1.0 ttl pk-yrs)    Types: Cigarettes    Start date: 81    Quit date: 31    Years since quitting: 50.1   Smokeless tobacco: Never   Tobacco comments:    social smoker stop at age 53  Vaping Use   Vaping status: Never Used  Substance Use Topics   Alcohol  use:  Yes    Alcohol /week: 1.0 - 2.0 standard drink of alcohol     Types: 1 - 2 Glasses of wine per week    Comment:  socially   Drug use: No       OPHTHALMIC EXAM:  Base Eye Exam     Visual Acuity (Snellen - Linear)       Right Left   Dist cc 20/20 20/25 -2   Dist ph cc  NI    Correction: Glasses         Tonometry (Tonopen, 1:08 PM)       Right Left   Pressure 13 14         Pupils       Pupils Dark Light Shape React APD   Right PERRL 3 2 Round Brisk None   Left PERRL 3 2 Round Brisk None         Visual Fields       Left Right    Full Full         Extraocular Movement       Right Left    Full, Ortho Full, Ortho         Neuro/Psych     Oriented x3: Yes   Mood/Affect: Normal         Dilation     Both eyes: 1.0% Mydriacyl, 2.5% Phenylephrine  @ 1:09 PM           Slit Lamp and Fundus Exam     Slit Lamp Exam       Right Left   Lids/Lashes Dermatochalasis - upper lid, Meibomian gland dysfunction Dermatochalasis - upper lid, mild MGD, incomplete blink   Conjunctiva/Sclera White and quiet White and quiet   Cornea Mild arcus, well healed cataract wound, tear film debris, fine endo pigment 2+ inferior Punctate epithelial erosions greatest, tear film debris, well healed cataract wound   Anterior Chamber deep and clear deep and clear   Iris Round and dilated Round and dilated   Lens PC IOL in good position PC IOL in good postion with open PC   Anterior Vitreous Vitreous syneresis, Posterior vitreous detachment Vitreous syneresis, no pigment, Posterior vitreous detachment, operculum ST periphery         Fundus Exam       Right Left   Disc Pink and Sharp, mild tilt, focal PPP temporal, +elevation -- improved, no edema Pink, +elevation/edema greatest superior quad, mild blurring of margins -- improved, sharp rim   C/D Ratio 0.2 0.2   Macula Flat, good foveal reflex, mild RPE mottling, No heme or edema Flat, good foveal reflex, mild RPE mottling, No heme or edema, mild ERM   Vessels attenuated, Tortuous attenuated, Tortuous   Periphery Attached, No heme, no  RT/RD Attached, pigmented lattice with VR tuft at 0430, operculated hole at 0130 ora -- good laser surrounding all lesions, No heme, no new RT/RD/lattice           Refraction     Wearing Rx       Sphere Cylinder Axis Add   Right -1.00 +2.00 178 +2.00   Left -2.00 +2.00 002 +2.00    Type: Progressive           IMAGING AND PROCEDURES  Imaging and Procedures for 07/04/2024  OCT, Retina - OU - Both Eyes       Right Eye Quality was good. Central Foveal Thickness: 256. Progression has been stable. Findings include normal foveal contour, no IRF, no SRF (stable improvement in  disc edema).   Left Eye Quality was good. Central Foveal Thickness: 282. Progression has been stable. Findings include normal foveal contour, no IRF, no SRF, epiretinal membrane, macular pucker (Mild persistent disc edema, focal ERM w/ mild pucker).   Notes *Images captured and stored on drive  Diagnosis / Impression:  NFP, no IRF/SRF OU OD: stable improvement in disc edema OS: Mild persistent disc edema, focal ERM w/ mild pucker  Clinical management:  See below  Abbreviations: NFP - Normal foveal profile. CME - cystoid macular edema. PED - pigment epithelial detachment. IRF - intraretinal fluid. SRF - subretinal fluid. EZ - ellipsoid zone. ERM - epiretinal membrane. ORA - outer retinal atrophy. ORT - outer retinal tubulation. SRHM - subretinal hyper-reflective material. IRHM - intraretinal hyper-reflective material             ASSESSMENT/PLAN:    ICD-10-CM   1. Retinal hole of left eye  H33.322 OCT, Retina - OU - Both Eyes    2. Lattice degeneration of left retina  H35.412     3. Vitreoretinal tuft of left eye  Q14.1     4. Optic disc edema  H47.10     5. Pseudophakia, both eyes  Z96.1      1. Operculated retinal hole, OS   - operculated hole at 0130 ora - s/p indirect laser retinopexy OS (10.24.23) -- good laser in place - no new RT/RD or lattice OS - f/u 1 year -- DFE,  OCT  2,3. Lattice degeneration w/ VR tuft, left eye - pigmented lattice with VR tuft at 0430 - s/p laser retinopexy OS (10.24.23) as above -- good laser in place - no new RT/RD or lattice - f/u 1 yr - DFE, OCT  4. Optic disc edema OU (OS > OD) -- stably improved  - under the expert management of Dr. Manson, Neuro-Oph, at [facility]  - currently off Diamox  - Minimal disc edema / elevation OU on exam and OCT today - pt reports history of lower back / spine / nerve issues -- has a spinal cord stimulatorx - pt had MRI orbits and LP done emergently on 11.9.23 due to worsening disc edema   MRI orbits showed findings suggesting of IIH and papilledema   LP showed opening pressure of 22cm H2O -- slightly elevated - 11.29.23 -- pt saw Dr. Ines, Neurologist at [facility] who advised stoppage of Vit A derivative meds - 12.1.23 -- pt saw Dr. Manson, Neuro-Oph at [facility] and had her most recent follow up on 01.26.24 - Disc edema may be relative IIH w/ mildly elevated opening pressure and low IOP  5,6. Pseudophakia OU  - s/p CE/IOL (Dr. Waylan, OD: 09.23.23, OS: 10.04.23) - IOL in good position, doing well - s/p YAG OS 12.12.25 -- good PC opening - finished PF QID OS x7 days - open   7. Dry eyes OU - recommend artificial tears and lubricating ointment as needed; pt taking Meibo and oral supplement (blink) for dryness.   Ophthalmic Meds Ordered this visit:  No orders of the defined types were placed in this encounter.    Return in about 1 year (around 07/04/2025) for f/u Lattice, DFE, OCT.  There are no Patient Instructions on file for this visit.  This document serves as a record of services personally performed by Redell JUDITHANN Hans, MD, PhD. It was created on their behalf by Delon Newness COT, an ophthalmic technician. The creation of this record is the provider's dictation and/or activities during  the visit.    Electronically signed by: Delon Newness COT 01.21.26 1:18  AM  This document serves as a record of services personally performed by Redell JUDITHANN Hans, MD, PhD. It was created on their behalf by Almetta Pesa, an ophthalmic technician. The creation of this record is the provider's dictation and/or activities during the visit.    Electronically signed by: Almetta Pesa, OA, 07/11/24  1:18 AM  This document serves as a record of services personally performed by Redell JUDITHANN Hans, MD, PhD. It was created on their behalf by Wanda GEANNIE Keens, COT an ophthalmic technician. The creation of this record is the provider's dictation and/or activities during the visit.    Electronically signed by:  Wanda GEANNIE Keens, COT  07/11/24 1:18 AM  Redell JUDITHANN Hans, M.D., Ph.D. Diseases & Surgery of the Retina and Vitreous [facility]  I have reviewed the above documentation for accuracy and completeness, and I agree with the above. Redell JUDITHANN Hans, M.D., Ph.D. 08/23/23 1:18 AM   Abbreviations: M myopia (nearsighted); A astigmatism; H hyperopia (farsighted); P presbyopia; Mrx spectacle prescription;  CTL contact lenses; OD right eye; OS left eye; OU both eyes  XT exotropia; ET esotropia; PEK punctate epithelial keratitis; PEE punctate epithelial erosions; DES dry eye syndrome; MGD meibomian gland dysfunction; ATs artificial tears; PFAT's preservative free artificial tears; NSC nuclear sclerotic cataract; PSC posterior subcapsular cataract; ERM epi-retinal membrane; PVD posterior vitreous detachment; RD retinal detachment; DM diabetes mellitus; DR diabetic retinopathy; NPDR non-proliferative diabetic retinopathy; PDR proliferative diabetic retinopathy; CSME clinically significant macular edema; DME diabetic macular edema; dbh dot blot hemorrhages; CWS cotton wool spot; POAG primary open angle glaucoma; C/D cup-to-disc ratio; HVF humphrey visual field; GVF goldmann visual field; OCT optical coherence tomography; IOP intraocular pressure; BRVO Branch  retinal vein occlusion; CRVO central retinal vein occlusion; CRAO central retinal artery occlusion; BRAO branch retinal artery occlusion; RT retinal tear; SB scleral buckle; PPV pars plana vitrectomy; VH Vitreous hemorrhage; PRP panretinal laser photocoagulation; IVK intravitreal kenalog ; VMT vitreomacular traction; MH Macular hole;  NVD neovascularization of the disc; NVE neovascularization elsewhere; AREDS age related eye disease study; ARMD age related macular degeneration; POAG primary open angle glaucoma; EBMD epithelial/anterior basement membrane dystrophy; ACIOL anterior chamber intraocular lens; IOL intraocular lens; PCIOL posterior chamber intraocular lens; Phaco/IOL phacoemulsification with intraocular lens placement; PRK photorefractive keratectomy; LASIK laser assisted in situ keratomileusis; HTN hypertension; DM diabetes mellitus; COPD chronic obstructive pulmonary disease "

## 2024-07-04 ENCOUNTER — Ambulatory Visit (INDEPENDENT_AMBULATORY_CARE_PROVIDER_SITE_OTHER): Admitting: Ophthalmology

## 2024-07-04 ENCOUNTER — Encounter (INDEPENDENT_AMBULATORY_CARE_PROVIDER_SITE_OTHER): Payer: Self-pay | Admitting: Ophthalmology

## 2024-07-04 DIAGNOSIS — H33322 Round hole, left eye: Secondary | ICD-10-CM

## 2024-07-04 DIAGNOSIS — H35412 Lattice degeneration of retina, left eye: Secondary | ICD-10-CM

## 2024-07-04 DIAGNOSIS — Q141 Congenital malformation of retina: Secondary | ICD-10-CM

## 2024-07-04 DIAGNOSIS — H4710 Unspecified papilledema: Secondary | ICD-10-CM

## 2024-07-04 DIAGNOSIS — H26492 Other secondary cataract, left eye: Secondary | ICD-10-CM

## 2024-07-04 DIAGNOSIS — Z961 Presence of intraocular lens: Secondary | ICD-10-CM

## 2024-07-07 ENCOUNTER — Encounter: Admitting: Physician Assistant

## 2024-07-11 ENCOUNTER — Encounter (INDEPENDENT_AMBULATORY_CARE_PROVIDER_SITE_OTHER): Payer: Self-pay | Admitting: Ophthalmology

## 2024-08-20 ENCOUNTER — Encounter (INDEPENDENT_AMBULATORY_CARE_PROVIDER_SITE_OTHER): Admitting: Ophthalmology

## 2024-08-27 ENCOUNTER — Ambulatory Visit

## 2025-07-08 ENCOUNTER — Encounter (INDEPENDENT_AMBULATORY_CARE_PROVIDER_SITE_OTHER): Admitting: Ophthalmology
